# Patient Record
Sex: Female | Born: 1945
Health system: Southern US, Community
[De-identification: ages and names within clinical notes are randomized; demographics above are authoritative.]

## PROBLEM LIST (undated history)

## (undated) DIAGNOSIS — L859 Epidermal thickening, unspecified: Secondary | ICD-10-CM

## (undated) DIAGNOSIS — E785 Hyperlipidemia, unspecified: Secondary | ICD-10-CM

## (undated) DIAGNOSIS — N200 Calculus of kidney: Secondary | ICD-10-CM

## (undated) DIAGNOSIS — R51 Headache: Secondary | ICD-10-CM

## (undated) DIAGNOSIS — M1990 Unspecified osteoarthritis, unspecified site: Secondary | ICD-10-CM

## (undated) DIAGNOSIS — E663 Overweight: Secondary | ICD-10-CM

## (undated) DIAGNOSIS — R32 Unspecified urinary incontinence: Secondary | ICD-10-CM

## (undated) DIAGNOSIS — N951 Menopausal and female climacteric states: Secondary | ICD-10-CM

## (undated) DIAGNOSIS — R232 Flushing: Secondary | ICD-10-CM

## (undated) DIAGNOSIS — K219 Gastro-esophageal reflux disease without esophagitis: Secondary | ICD-10-CM

## (undated) DIAGNOSIS — G44229 Chronic tension-type headache, not intractable: Secondary | ICD-10-CM

## (undated) DIAGNOSIS — G709 Myoneural disorder, unspecified: Secondary | ICD-10-CM

## (undated) DIAGNOSIS — E079 Disorder of thyroid, unspecified: Secondary | ICD-10-CM

## (undated) HISTORY — DX: Overweight: E66.3

## (undated) HISTORY — DX: Unspecified urinary incontinence: R32

## (undated) HISTORY — DX: Myoneural disorder, unspecified: G70.9

## (undated) HISTORY — DX: Hyperlipidemia, unspecified: E78.5

## (undated) HISTORY — DX: Headache: R51

## (undated) HISTORY — PX: TUBAL LIGATION: SHX77

## (undated) HISTORY — DX: Flushing: R23.2

## (undated) HISTORY — PX: BREAST SURGERY: SHX581

## (undated) HISTORY — DX: Calculus of kidney: N20.0

## (undated) HISTORY — DX: Menopausal and female climacteric states: N95.1

## (undated) HISTORY — PX: APPENDECTOMY: SHX54

## (undated) HISTORY — PX: BREAST EXCISIONAL BIOPSY: SUR124

## (undated) HISTORY — PX: ABDOMINAL HYSTERECTOMY: SHX81

## (undated) HISTORY — DX: Epidermal thickening, unspecified: L85.9

## (undated) HISTORY — DX: Chronic tension-type headache, not intractable: G44.229

## (undated) HISTORY — DX: Gastro-esophageal reflux disease without esophagitis: K21.9

## (undated) HISTORY — DX: Unspecified osteoarthritis, unspecified site: M19.90

## (undated) HISTORY — PX: EYE SURGERY: SHX253

## (undated) HISTORY — DX: Disorder of thyroid, unspecified: E07.9

## (undated) HISTORY — PX: OTHER SURGICAL HISTORY: SHX169

---

## 1978-10-11 HISTORY — PX: OTHER SURGICAL HISTORY: SHX169

## 1993-10-11 HISTORY — PX: VESICOVAGINAL FISTULA CLOSURE W/ TAH: SUR271

## 1998-11-13 ENCOUNTER — Other Ambulatory Visit: Admission: RE | Admit: 1998-11-13 | Discharge: 1998-11-13 | Payer: Self-pay | Admitting: *Deleted

## 1999-12-10 ENCOUNTER — Encounter: Payer: Self-pay | Admitting: *Deleted

## 1999-12-10 ENCOUNTER — Encounter: Admission: RE | Admit: 1999-12-10 | Discharge: 1999-12-10 | Payer: Self-pay | Admitting: *Deleted

## 1999-12-23 ENCOUNTER — Other Ambulatory Visit: Admission: RE | Admit: 1999-12-23 | Discharge: 1999-12-23 | Payer: Self-pay | Admitting: *Deleted

## 2000-01-14 ENCOUNTER — Ambulatory Visit (HOSPITAL_COMMUNITY): Admission: RE | Admit: 2000-01-14 | Discharge: 2000-01-14 | Payer: Self-pay | Admitting: General Surgery

## 2000-12-21 ENCOUNTER — Other Ambulatory Visit: Admission: RE | Admit: 2000-12-21 | Discharge: 2000-12-21 | Payer: Self-pay | Admitting: *Deleted

## 2000-12-21 ENCOUNTER — Encounter: Payer: Self-pay | Admitting: General Surgery

## 2000-12-21 ENCOUNTER — Encounter: Admission: RE | Admit: 2000-12-21 | Discharge: 2000-12-21 | Payer: Self-pay | Admitting: General Surgery

## 2001-12-26 ENCOUNTER — Encounter: Payer: Self-pay | Admitting: General Surgery

## 2001-12-26 ENCOUNTER — Other Ambulatory Visit: Admission: RE | Admit: 2001-12-26 | Discharge: 2001-12-26 | Payer: Self-pay | Admitting: *Deleted

## 2001-12-26 ENCOUNTER — Encounter: Admission: RE | Admit: 2001-12-26 | Discharge: 2001-12-26 | Payer: Self-pay | Admitting: General Surgery

## 2002-01-12 ENCOUNTER — Ambulatory Visit (HOSPITAL_COMMUNITY): Admission: RE | Admit: 2002-01-12 | Discharge: 2002-01-12 | Payer: Self-pay | Admitting: General Surgery

## 2002-01-12 ENCOUNTER — Encounter: Payer: Self-pay | Admitting: General Surgery

## 2002-04-26 ENCOUNTER — Ambulatory Visit (HOSPITAL_COMMUNITY): Admission: RE | Admit: 2002-04-26 | Discharge: 2002-04-26 | Payer: Self-pay | Admitting: Gastroenterology

## 2002-07-27 ENCOUNTER — Ambulatory Visit (HOSPITAL_COMMUNITY): Admission: RE | Admit: 2002-07-27 | Discharge: 2002-07-27 | Payer: Self-pay | Admitting: Gastroenterology

## 2002-07-27 ENCOUNTER — Encounter (INDEPENDENT_AMBULATORY_CARE_PROVIDER_SITE_OTHER): Payer: Self-pay | Admitting: Specialist

## 2003-04-04 ENCOUNTER — Encounter: Payer: Self-pay | Admitting: *Deleted

## 2003-04-04 ENCOUNTER — Encounter: Admission: RE | Admit: 2003-04-04 | Discharge: 2003-04-04 | Payer: Self-pay | Admitting: *Deleted

## 2003-04-29 ENCOUNTER — Other Ambulatory Visit: Admission: RE | Admit: 2003-04-29 | Discharge: 2003-04-29 | Payer: Self-pay | Admitting: *Deleted

## 2003-10-12 HISTORY — PX: SPINE SURGERY: SHX786

## 2003-11-15 ENCOUNTER — Encounter: Admission: RE | Admit: 2003-11-15 | Discharge: 2003-11-15 | Payer: Self-pay | Admitting: Oncology

## 2004-05-06 ENCOUNTER — Encounter: Admission: RE | Admit: 2004-05-06 | Discharge: 2004-05-06 | Payer: Self-pay | Admitting: General Surgery

## 2004-06-08 ENCOUNTER — Other Ambulatory Visit: Admission: RE | Admit: 2004-06-08 | Discharge: 2004-06-08 | Payer: Self-pay | Admitting: *Deleted

## 2004-07-28 ENCOUNTER — Encounter: Admission: RE | Admit: 2004-07-28 | Discharge: 2004-07-28 | Payer: Self-pay | Admitting: Family Medicine

## 2004-08-25 ENCOUNTER — Ambulatory Visit (HOSPITAL_COMMUNITY): Admission: RE | Admit: 2004-08-25 | Discharge: 2004-08-26 | Payer: Self-pay | Admitting: Neurosurgery

## 2004-11-04 ENCOUNTER — Ambulatory Visit: Payer: Self-pay | Admitting: Oncology

## 2005-05-07 ENCOUNTER — Encounter: Admission: RE | Admit: 2005-05-07 | Discharge: 2005-05-07 | Payer: Self-pay | Admitting: Oncology

## 2005-06-16 ENCOUNTER — Ambulatory Visit: Payer: Self-pay | Admitting: Oncology

## 2005-07-06 ENCOUNTER — Other Ambulatory Visit: Admission: RE | Admit: 2005-07-06 | Discharge: 2005-07-06 | Payer: Self-pay | Admitting: *Deleted

## 2005-08-02 ENCOUNTER — Encounter: Admission: RE | Admit: 2005-08-02 | Discharge: 2005-08-02 | Payer: Self-pay | Admitting: Oncology

## 2005-08-27 ENCOUNTER — Ambulatory Visit: Payer: Self-pay | Admitting: Oncology

## 2005-09-15 ENCOUNTER — Encounter: Admission: RE | Admit: 2005-09-15 | Discharge: 2005-09-15 | Payer: Self-pay | Admitting: Oncology

## 2005-12-20 ENCOUNTER — Ambulatory Visit: Payer: Self-pay | Admitting: Oncology

## 2006-03-14 ENCOUNTER — Encounter: Admission: RE | Admit: 2006-03-14 | Discharge: 2006-03-14 | Payer: Self-pay | Admitting: Family Medicine

## 2006-05-12 ENCOUNTER — Encounter: Admission: RE | Admit: 2006-05-12 | Discharge: 2006-07-28 | Payer: Self-pay | Admitting: Family Medicine

## 2006-07-08 ENCOUNTER — Ambulatory Visit: Payer: Self-pay | Admitting: Oncology

## 2006-08-10 ENCOUNTER — Encounter: Admission: RE | Admit: 2006-08-10 | Discharge: 2006-09-09 | Payer: Self-pay | Admitting: Specialist

## 2006-09-10 ENCOUNTER — Encounter: Admission: RE | Admit: 2006-09-10 | Discharge: 2006-09-30 | Payer: Self-pay | Admitting: Specialist

## 2006-09-28 ENCOUNTER — Other Ambulatory Visit: Admission: RE | Admit: 2006-09-28 | Discharge: 2006-09-28 | Payer: Self-pay | Admitting: *Deleted

## 2006-09-30 ENCOUNTER — Encounter: Admission: RE | Admit: 2006-09-30 | Discharge: 2006-09-30 | Payer: Self-pay | Admitting: Oncology

## 2007-07-24 ENCOUNTER — Ambulatory Visit: Payer: Self-pay | Admitting: Oncology

## 2007-07-25 LAB — MORPHOLOGY

## 2007-07-25 LAB — CBC WITH DIFFERENTIAL/PLATELET
Basophils Absolute: 0 10*3/uL (ref 0.0–0.1)
Eosinophils Absolute: 0.1 10*3/uL (ref 0.0–0.5)
HGB: 13.3 g/dL (ref 11.6–15.9)
MCV: 88.4 fL (ref 81.0–101.0)
MONO%: 5.7 % (ref 0.0–13.0)
NEUT#: 4 10*3/uL (ref 1.5–6.5)
RDW: 13.3 % (ref 11.3–14.5)

## 2007-07-25 LAB — FERRITIN: Ferritin: 84 ng/mL (ref 10–291)

## 2007-07-25 LAB — COMPREHENSIVE METABOLIC PANEL
ALT: 19 U/L (ref 0–35)
AST: 18 U/L (ref 0–37)
Alkaline Phosphatase: 75 U/L (ref 39–117)
Sodium: 140 mEq/L (ref 135–145)
Total Bilirubin: 0.9 mg/dL (ref 0.3–1.2)
Total Protein: 7.3 g/dL (ref 6.0–8.3)

## 2007-10-02 ENCOUNTER — Other Ambulatory Visit: Admission: RE | Admit: 2007-10-02 | Discharge: 2007-10-02 | Payer: Self-pay | Admitting: *Deleted

## 2007-10-09 ENCOUNTER — Encounter: Admission: RE | Admit: 2007-10-09 | Discharge: 2007-10-09 | Payer: Self-pay | Admitting: Oncology

## 2008-01-26 ENCOUNTER — Ambulatory Visit: Payer: Self-pay | Admitting: Oncology

## 2008-01-30 LAB — CBC WITH DIFFERENTIAL/PLATELET
Basophils Absolute: 0 10*3/uL (ref 0.0–0.1)
Eosinophils Absolute: 0.1 10*3/uL (ref 0.0–0.5)
HCT: 36.8 % (ref 34.8–46.6)
HGB: 12.8 g/dL (ref 11.6–15.9)
LYMPH%: 49 % — ABNORMAL HIGH (ref 14.0–48.0)
MCV: 87.8 fL (ref 81.0–101.0)
MONO%: 6.2 % (ref 0.0–13.0)
NEUT#: 2.4 10*3/uL (ref 1.5–6.5)
NEUT%: 42.4 % (ref 39.6–76.8)
Platelets: 182 10*3/uL (ref 145–400)

## 2008-01-30 LAB — COMPREHENSIVE METABOLIC PANEL
Albumin: 4.5 g/dL (ref 3.5–5.2)
Alkaline Phosphatase: 62 U/L (ref 39–117)
BUN: 13 mg/dL (ref 6–23)
Creatinine, Ser: 0.7 mg/dL (ref 0.40–1.20)
Glucose, Bld: 121 mg/dL — ABNORMAL HIGH (ref 70–99)
Potassium: 4.1 mEq/L (ref 3.5–5.3)
Total Bilirubin: 0.5 mg/dL (ref 0.3–1.2)

## 2008-05-15 ENCOUNTER — Encounter: Admission: RE | Admit: 2008-05-15 | Discharge: 2008-05-15 | Payer: Self-pay | Admitting: Family Medicine

## 2008-09-18 ENCOUNTER — Encounter: Admission: RE | Admit: 2008-09-18 | Discharge: 2008-09-18 | Payer: Self-pay | Admitting: Family Medicine

## 2008-10-02 ENCOUNTER — Other Ambulatory Visit: Admission: RE | Admit: 2008-10-02 | Discharge: 2008-10-02 | Payer: Self-pay | Admitting: Gynecology

## 2008-10-09 ENCOUNTER — Encounter: Admission: RE | Admit: 2008-10-09 | Discharge: 2008-10-09 | Payer: Self-pay | Admitting: Oncology

## 2008-10-14 ENCOUNTER — Encounter: Admission: RE | Admit: 2008-10-14 | Discharge: 2008-10-14 | Payer: Self-pay | Admitting: Oncology

## 2008-10-14 ENCOUNTER — Ambulatory Visit: Payer: Self-pay | Admitting: Oncology

## 2008-10-15 ENCOUNTER — Encounter: Admission: RE | Admit: 2008-10-15 | Discharge: 2008-10-15 | Payer: Self-pay | Admitting: Oncology

## 2008-10-16 LAB — CBC WITH DIFFERENTIAL/PLATELET
Basophils Absolute: 0 10*3/uL (ref 0.0–0.1)
EOS%: 0.8 % (ref 0.0–7.0)
Eosinophils Absolute: 0.1 10*3/uL (ref 0.0–0.5)
HGB: 13.4 g/dL (ref 11.6–15.9)
LYMPH%: 35.7 % (ref 14.0–48.0)
MCH: 30.1 pg (ref 26.0–34.0)
MCV: 89.4 fL (ref 81.0–101.0)
MONO%: 4.8 % (ref 0.0–13.0)
NEUT#: 4.9 10*3/uL (ref 1.5–6.5)
Platelets: 198 10*3/uL (ref 145–400)
RBC: 4.45 10*6/uL (ref 3.70–5.32)

## 2008-10-16 LAB — COMPREHENSIVE METABOLIC PANEL
AST: 33 U/L (ref 0–37)
Alkaline Phosphatase: 73 U/L (ref 39–117)
BUN: 11 mg/dL (ref 6–23)
Glucose, Bld: 117 mg/dL — ABNORMAL HIGH (ref 70–99)
Total Bilirubin: 0.8 mg/dL (ref 0.3–1.2)

## 2008-11-21 ENCOUNTER — Encounter: Admission: RE | Admit: 2008-11-21 | Discharge: 2008-11-21 | Payer: Self-pay | Admitting: Oncology

## 2009-01-03 ENCOUNTER — Ambulatory Visit: Payer: Self-pay | Admitting: Genetic Counselor

## 2009-02-18 ENCOUNTER — Encounter: Admission: RE | Admit: 2009-02-18 | Discharge: 2009-02-18 | Payer: Self-pay | Admitting: Neurosurgery

## 2009-04-18 ENCOUNTER — Encounter: Admission: RE | Admit: 2009-04-18 | Discharge: 2009-04-18 | Payer: Self-pay | Admitting: Family Medicine

## 2009-07-21 ENCOUNTER — Ambulatory Visit: Payer: Self-pay | Admitting: Oncology

## 2009-07-23 LAB — CBC WITH DIFFERENTIAL/PLATELET
BASO%: 0.4 % (ref 0.0–2.0)
EOS%: 1.5 % (ref 0.0–7.0)
Eosinophils Absolute: 0.1 10*3/uL (ref 0.0–0.5)
LYMPH%: 47.8 % (ref 14.0–49.7)
MCH: 30 pg (ref 25.1–34.0)
MCHC: 34.5 g/dL (ref 31.5–36.0)
MCV: 87 fL (ref 79.5–101.0)
MONO%: 4.7 % (ref 0.0–14.0)
Platelets: 170 10*3/uL (ref 145–400)
RBC: 4.55 10*6/uL (ref 3.70–5.45)
RDW: 13.6 % (ref 11.2–14.5)

## 2009-07-23 LAB — COMPREHENSIVE METABOLIC PANEL
AST: 47 U/L — ABNORMAL HIGH (ref 0–37)
Alkaline Phosphatase: 70 U/L (ref 39–117)
Glucose, Bld: 130 mg/dL — ABNORMAL HIGH (ref 70–99)
Potassium: 4.6 mEq/L (ref 3.5–5.3)
Sodium: 140 mEq/L (ref 135–145)
Total Bilirubin: 0.6 mg/dL (ref 0.3–1.2)
Total Protein: 7.7 g/dL (ref 6.0–8.3)

## 2009-10-14 ENCOUNTER — Encounter: Admission: RE | Admit: 2009-10-14 | Discharge: 2009-10-14 | Payer: Self-pay | Admitting: Oncology

## 2009-10-29 ENCOUNTER — Encounter: Admission: RE | Admit: 2009-10-29 | Discharge: 2009-10-29 | Payer: Self-pay | Admitting: Oncology

## 2009-11-06 DIAGNOSIS — Z947 Corneal transplant status: Secondary | ICD-10-CM | POA: Insufficient documentation

## 2010-07-20 ENCOUNTER — Ambulatory Visit: Payer: Self-pay | Admitting: Oncology

## 2010-07-22 LAB — CBC WITH DIFFERENTIAL/PLATELET
BASO%: 0.3 % (ref 0.0–2.0)
Basophils Absolute: 0 10*3/uL (ref 0.0–0.1)
EOS%: 6.4 % (ref 0.0–7.0)
Eosinophils Absolute: 0.4 10*3/uL (ref 0.0–0.5)
HCT: 38.3 % (ref 34.8–46.6)
HGB: 13.2 g/dL (ref 11.6–15.9)
LYMPH%: 41.4 % (ref 14.0–49.7)
MCH: 30.5 pg (ref 25.1–34.0)
MCHC: 34.6 g/dL (ref 31.5–36.0)
MCV: 88.2 fL (ref 79.5–101.0)
MONO#: 0.4 10*3/uL (ref 0.1–0.9)
MONO%: 5.4 % (ref 0.0–14.0)
NEUT#: 3.1 10*3/uL (ref 1.5–6.5)
NEUT%: 46.5 % (ref 38.4–76.8)
Platelets: 168 10*3/uL (ref 145–400)
RBC: 4.34 10*6/uL (ref 3.70–5.45)
RDW: 14 % (ref 11.2–14.5)
WBC: 6.7 10*3/uL (ref 3.9–10.3)
lymph#: 2.8 10*3/uL (ref 0.9–3.3)

## 2010-07-22 LAB — COMPREHENSIVE METABOLIC PANEL
ALT: 26 U/L (ref 0–35)
AST: 25 U/L (ref 0–37)
Albumin: 4.5 g/dL (ref 3.5–5.2)
Alkaline Phosphatase: 83 U/L (ref 39–117)
BUN: 15 mg/dL (ref 6–23)
CO2: 23 mEq/L (ref 19–32)
Calcium: 10 mg/dL (ref 8.4–10.5)
Chloride: 106 mEq/L (ref 96–112)
Creatinine, Ser: 0.76 mg/dL (ref 0.40–1.20)
Glucose, Bld: 113 mg/dL — ABNORMAL HIGH (ref 70–99)
Potassium: 4.4 mEq/L (ref 3.5–5.3)
Sodium: 141 mEq/L (ref 135–145)
Total Bilirubin: 0.7 mg/dL (ref 0.3–1.2)
Total Protein: 7.7 g/dL (ref 6.0–8.3)

## 2010-10-19 ENCOUNTER — Encounter
Admission: RE | Admit: 2010-10-19 | Discharge: 2010-10-19 | Payer: Self-pay | Source: Home / Self Care | Attending: Oncology | Admitting: Oncology

## 2010-10-31 ENCOUNTER — Encounter: Payer: Self-pay | Admitting: Internal Medicine

## 2011-02-17 ENCOUNTER — Other Ambulatory Visit: Payer: Self-pay | Admitting: Family Medicine

## 2011-02-17 DIAGNOSIS — E041 Nontoxic single thyroid nodule: Secondary | ICD-10-CM

## 2011-02-18 ENCOUNTER — Other Ambulatory Visit: Payer: Self-pay

## 2011-02-26 NOTE — Op Note (Signed)
NAMEMarland Kitchen  ALLIZON, WOZNICK NO.:  [id number]   MEDICAL RECORD NO.:  [id number]          PATIENT TYPE:  OIB   LOCATION:  2899                         FACILITY:  [facility]   PHYSICIAN:  Danae Orleans. Venetia Maxon, M.D.  DATE OF BIRTH:  [birth year]   DATE OF PROCEDURE:  08/25/2004  DATE OF DISCHARGE:                                 OPERATIVE REPORT   PREOPERATIVE DIAGNOSIS:  Left L4-L5 foraminal stenosis with L5  radiculopathy, spondylosis, degenerative disc disease, and  spondylolisthesis.   POSTOPERATIVE DIAGNOSIS:  Left L4-L5 foraminal stenosis with L5  radiculopathy, spondylosis, degenerative disc disease, and  spondylolisthesis.   PROCEDURE:  Left L4-L5 foraminotomy.   SURGEON:  Danae Orleans. Venetia Maxon, M.D.   ASSISTANT:  Clydene Fake, M.D.   ANESTHESIA:  General endotracheal anesthesia.   ESTIMATED BLOOD LOSS:  Minimal.   COMPLICATIONS:  None.   DISPOSITION:  Recovery room.   INDICATIONS FOR PROCEDURE:  Conleigh Heinlein is a 65 year old woman with a left  L5 radiculopathy with lateral recess stenosis of L4-L5 on the left with  minimally mobile spondylolisthesis L4 and L5 and degenerative disc disease.  She has an L5 radiculopathy.  She had injections without long term relief of  her pain and it was elected to go ahead with foraminotomy to decompress the  L5 nerve root.   PROCEDURE:  Ms. Bartolotta was brought to the operating room.  Following  satisfactory and uncomplicated induction of general endotracheal anesthesia  and placement of intravenous lines, the patient was placed in a prone  position on the operating table.  She was placed on the Wilson frame.  Her  low back was then prepped and draped in the usual sterile fashion.  The area  of planned incision was infiltrated with 0.25% Marcaine and 0.5% lidocaine  with 1:200,000 epinephrine.  An incision was made overlying the L4-L5  interspace and carried through adipost tissue to the lumbodorsal fascia  which was incised to  the left side of midline.  Subperiosteal dissection was  performed exposing what was felt to be the L4-L5 interspace.  An  interoperative x-ray was obtained which confirmed correct orientation at the  L4-L5 level.  Using a high speed drill, hemilaminectomy of L4 was performed  and this was completed using Kerrison rongeurs.  The ligamentum flavum was  detached from the superior aspect of the L5 lamina and this was removed with  Kerrison rongeurs.  The ligamentum flavum was then detached and removed in a  piecemeal fashion.  The lateral recess was decompressed and the L5 nerve  root was decompressed.  There appeared to be no residual lateral recess  stenosis after this area was carefully decompressed using a variety of  Kerrison rongeurs under loupe magnification.  Hemostasis was assured.  The  wound was copiously irrigated with Bacitracin saline.  The operative site  was bathed in 2 mL of Fentanyl and 80 mg Depo-Medrol.  The self-retaining  retractor was removed.  The lumbodorsal fascia was closed with 0 Vicryl  sutures, the subcutaneous tissues were reapproximated with 2-0 Vicryl  interrupted inverted sutures, and the skin edges  were reapproximated with  interrupted 3-0 Vicryl subcuticular tissue.  The wound was dressed with  Dermabond.  The patient was extubated in the operating room and taken to the  recovery room in stable condition having tolerated the operation well.  Counts were correct at the end of the case.      Jose   JDS/MEDQ  D:  08/25/2004  T:  08/25/2004  Job:  [id number]

## 2011-02-26 NOTE — Op Note (Signed)
NAME:  Laura Farrell, Laura Farrell                         ACCOUNT NO.:  [id number]   MEDICAL RECORD NO.:  [id number]                   PATIENT TYPE:  AMB   LOCATION:  ENDO                                 FACILITY:  [facility]   PHYSICIAN:  Bernette Redbird, MD                  DATE OF BIRTH:  [birth year]   DATE OF PROCEDURE:  07/27/2002  DATE OF DISCHARGE:                                 OPERATIVE REPORT   PROCEDURE:  Upper endoscopy with biopsies.   INDICATIONS:  Follow-up of erosive esophagitis noted endoscopically about  four months ago.  The patient has been switched from Prevacid to Protonix,  which I encouraged her to use on a more regular basis.   FINDINGS:  Short-segment erythema in distal esophagus, questionable  Barrett's esophagus versus residual inflammatory changes.   DESCRIPTION OF PROCEDURE:  The nature, purpose, and risks of the procedure  were familiar to the patient from prior examination, and she provided  written consent.  Sedation was fentanyl 40 mcg and Versed 4 mg IV, without  arrhythmias or desaturation.  There were subtle inflammatory changes in the  laryngeal region characterized by some slight erythema toward the posterior  end of the vocal cords on the arytenoid cartilages.  The Olympus small-  caliber video endoscope was passed under direct vision into the esophagus.  The distal esophagus had some tongues of erythematous mucosa with perhaps  some minimal erosive changes.  It is unclear whether this represents  Barrett's esophagus or some mild residual esophagitis, for which several  biopsies were obtained at the conclusion of the procedure.  A transiently-  observed esophageal ring was present, but there was certainly no evidence of  distal stricturing or any tight ring.  I saw no evidence of infection,  neoplasia, or varices.  There was a roughly 2 cm hiatal hernia, and the  diaphragmatic hiatus was slightly patulous.   The stomach contained no significant residual,  had some faint antral  erythema, but otherwise the mucosa looked normal, and no erosions, ulcers,  polyps, or masses were observed, including a retroflexed view of the  proximal stomach.  The pylorus, duodenal bulb, and second duodenum were  unremarkable.   The patient tolerated this procedure well, and there were no apparent  complications.   IMPRESSION:  1. Possible changes in the distal esophagus secondary to reflux, pathology     pending.  2. Small to medium-sized hiatal hernia.   PLAN:  Await pathology on today's biopsies.  Continue PPI therapy.                                               Bernette Redbird, MD    RB/MEDQ  D:  07/27/2002  T:  07/28/2002  Job:  [id number]   cc:  Chales Salmon. Abigail Miyamoto, M.D.

## 2011-03-01 ENCOUNTER — Other Ambulatory Visit: Payer: Self-pay

## 2011-07-21 ENCOUNTER — Other Ambulatory Visit: Payer: Self-pay | Admitting: Oncology

## 2011-07-21 ENCOUNTER — Encounter (HOSPITAL_BASED_OUTPATIENT_CLINIC_OR_DEPARTMENT_OTHER): Payer: Self-pay | Admitting: Oncology

## 2011-07-21 DIAGNOSIS — Z803 Family history of malignant neoplasm of breast: Secondary | ICD-10-CM

## 2011-07-21 DIAGNOSIS — Z1231 Encounter for screening mammogram for malignant neoplasm of breast: Secondary | ICD-10-CM

## 2011-08-02 DIAGNOSIS — T85398A Other mechanical complication of other ocular prosthetic devices, implants and grafts, initial encounter: Secondary | ICD-10-CM | POA: Insufficient documentation

## 2011-10-09 ENCOUNTER — Telehealth: Payer: Self-pay | Admitting: Oncology

## 2011-10-09 NOTE — Telephone Encounter (Signed)
S/w the pt and she is aware of her lab appt in feb and the md appt in oct.

## 2011-10-21 ENCOUNTER — Ambulatory Visit: Payer: Self-pay

## 2011-10-22 ENCOUNTER — Ambulatory Visit
Admission: RE | Admit: 2011-10-22 | Discharge: 2011-10-22 | Disposition: A | Discharge status: Home / Self Care | Payer: Medicare Other | Source: Ambulatory Visit | Attending: Oncology | Admitting: Oncology

## 2011-10-22 DIAGNOSIS — Z1231 Encounter for screening mammogram for malignant neoplasm of breast: Secondary | ICD-10-CM | POA: Diagnosis not present

## 2011-10-27 DIAGNOSIS — G250 Essential tremor: Secondary | ICD-10-CM | POA: Diagnosis not present

## 2011-10-27 DIAGNOSIS — G252 Other specified forms of tremor: Secondary | ICD-10-CM | POA: Diagnosis not present

## 2011-11-04 DIAGNOSIS — R7301 Impaired fasting glucose: Secondary | ICD-10-CM | POA: Diagnosis not present

## 2011-11-04 DIAGNOSIS — E039 Hypothyroidism, unspecified: Secondary | ICD-10-CM | POA: Diagnosis not present

## 2011-11-04 DIAGNOSIS — K219 Gastro-esophageal reflux disease without esophagitis: Secondary | ICD-10-CM | POA: Diagnosis not present

## 2011-11-04 DIAGNOSIS — E7800 Pure hypercholesterolemia, unspecified: Secondary | ICD-10-CM | POA: Diagnosis not present

## 2011-11-04 DIAGNOSIS — Z79899 Other long term (current) drug therapy: Secondary | ICD-10-CM | POA: Diagnosis not present

## 2011-11-15 ENCOUNTER — Other Ambulatory Visit (HOSPITAL_BASED_OUTPATIENT_CLINIC_OR_DEPARTMENT_OTHER): Payer: Medicare Other | Admitting: Lab

## 2011-11-15 DIAGNOSIS — Z803 Family history of malignant neoplasm of breast: Secondary | ICD-10-CM | POA: Diagnosis not present

## 2011-11-15 LAB — BASIC METABOLIC PANEL
CO2: 23 mEq/L (ref 19–32)
Chloride: 103 mEq/L (ref 96–112)
Sodium: 140 mEq/L (ref 135–145)

## 2011-11-16 ENCOUNTER — Other Ambulatory Visit: Payer: Self-pay | Admitting: Oncology

## 2011-11-16 ENCOUNTER — Telehealth: Payer: Self-pay | Admitting: Oncology

## 2011-11-16 DIAGNOSIS — C50919 Malignant neoplasm of unspecified site of unspecified female breast: Secondary | ICD-10-CM

## 2011-11-16 NOTE — Telephone Encounter (Signed)
Called Cathy [facility], left message regarding MRI of Breast within 3 months of mammogram, which was done 10/25/11. Waiting for an appt date.

## 2011-11-17 ENCOUNTER — Other Ambulatory Visit: Payer: Self-pay | Admitting: Oncology

## 2011-11-17 DIAGNOSIS — Z853 Personal history of malignant neoplasm of breast: Secondary | ICD-10-CM

## 2011-11-22 ENCOUNTER — Telehealth: Payer: Self-pay

## 2011-11-22 NOTE — Telephone Encounter (Signed)
LM FOR KATHY TO CALL TOMORROW TO VERIFY THAT DR. Darrold Span REENTERED THE MRI ORDER INCORRECTLY AGAIN ON 11-17-11.  ORDER SHOULD READ FAMILY HX OF BREAST CANCER NOT PERSONAL HISTORY. ORDER WILL NEED REENTERING IF INCORRECT.

## 2011-11-23 ENCOUNTER — Telehealth: Payer: Self-pay | Admitting: Oncology

## 2011-11-23 ENCOUNTER — Telehealth: Payer: Self-pay

## 2011-11-23 ENCOUNTER — Other Ambulatory Visit: Payer: Self-pay | Admitting: Oncology

## 2011-11-23 DIAGNOSIS — Z803 Family history of malignant neoplasm of breast: Secondary | ICD-10-CM

## 2011-11-23 NOTE — Telephone Encounter (Signed)
Received call from [facility] at the [facility] [place] stating that when she actually spoke with pt yesterday and reviewed records, pt stated that she does not have a personal history of breast cancer, but that she has a family history of breast cancer (sister and cousins).  She requests that Dr. Darrold Span cancel 2 previous MRI orders, and enter a new one with dx code V16.3, family history of breast malignancy.  She apologizes for the mix-up.  This information relayed to MD.

## 2011-11-23 NOTE — Telephone Encounter (Signed)
Called Lynden Ang again at the [facility] , left another message regarding MRI of Breast, still waiting for pt to be schedule.

## 2011-11-24 ENCOUNTER — Other Ambulatory Visit: Payer: Self-pay | Admitting: Physician Assistant

## 2011-11-24 DIAGNOSIS — L538 Other specified erythematous conditions: Secondary | ICD-10-CM | POA: Diagnosis not present

## 2011-11-24 DIAGNOSIS — L259 Unspecified contact dermatitis, unspecified cause: Secondary | ICD-10-CM | POA: Diagnosis not present

## 2011-11-24 DIAGNOSIS — D485 Neoplasm of uncertain behavior of skin: Secondary | ICD-10-CM | POA: Diagnosis not present

## 2011-11-24 DIAGNOSIS — D239 Other benign neoplasm of skin, unspecified: Secondary | ICD-10-CM | POA: Diagnosis not present

## 2011-11-24 DIAGNOSIS — T85398A Other mechanical complication of other ocular prosthetic devices, implants and grafts, initial encounter: Secondary | ICD-10-CM | POA: Diagnosis not present

## 2011-11-26 ENCOUNTER — Telehealth: Payer: Self-pay

## 2011-11-26 DIAGNOSIS — Z803 Family history of malignant neoplasm of breast: Secondary | ICD-10-CM

## 2011-11-26 NOTE — Telephone Encounter (Signed)
ORDER FOR BREAST MRI CORRECTLY ORDERED TODAY EXCEPT NO E SIGNITURE.  KATHY WILL PRINT ORDER AND FAX TO DR. Darrold Span TO SIGN.

## 2011-12-02 NOTE — Progress Notes (Signed)
Order for MRI of breast, bilateral, signed by MD as requested by Osborne Oman with the [facility] and faxed back to [phone].

## 2011-12-08 ENCOUNTER — Ambulatory Visit
Admission: RE | Admit: 2011-12-08 | Discharge: 2011-12-08 | Disposition: A | Discharge status: Home / Self Care | Payer: Medicare Other | Source: Ambulatory Visit | Attending: Oncology | Admitting: Oncology

## 2011-12-08 DIAGNOSIS — N6489 Other specified disorders of breast: Secondary | ICD-10-CM | POA: Diagnosis not present

## 2011-12-08 DIAGNOSIS — Z803 Family history of malignant neoplasm of breast: Secondary | ICD-10-CM

## 2011-12-08 MED ORDER — GADOBENATE DIMEGLUMINE 529 MG/ML IV SOLN
17.0000 mL | Freq: Once | INTRAVENOUS | Status: AC | PRN
  Administered 2011-12-08: 17 mL via INTRAVENOUS

## 2011-12-09 ENCOUNTER — Other Ambulatory Visit: Payer: Medicare Other

## 2011-12-14 ENCOUNTER — Telehealth: Payer: Self-pay

## 2011-12-14 NOTE — Telephone Encounter (Signed)
SPOKE WITH [name] AND TOLD HER THAT HER BREAST MRI DONE ON 12-08-11 WAS FINE. [name] STATED THAT THE CAD READING PORTION OF THE MRI MAY NOT BE COVERED BY HER INSURANCE.  TOLD HER THAT SHE CAN BRING IN AN APPEAL TO DR. Precious Reel NURSE IF PAYMENT DENIED AND OUR MANAGED CARE TEAM CAN LOOK IN FILING APPEAL.

## 2011-12-22 DIAGNOSIS — G252 Other specified forms of tremor: Secondary | ICD-10-CM | POA: Diagnosis not present

## 2011-12-22 DIAGNOSIS — G250 Essential tremor: Secondary | ICD-10-CM | POA: Diagnosis not present

## 2011-12-23 DIAGNOSIS — R198 Other specified symptoms and signs involving the digestive system and abdomen: Secondary | ICD-10-CM | POA: Diagnosis not present

## 2011-12-23 DIAGNOSIS — K591 Functional diarrhea: Secondary | ICD-10-CM | POA: Diagnosis not present

## 2011-12-23 DIAGNOSIS — L538 Other specified erythematous conditions: Secondary | ICD-10-CM | POA: Diagnosis not present

## 2012-01-05 ENCOUNTER — Encounter: Payer: Self-pay | Admitting: Neurology

## 2012-01-20 DIAGNOSIS — L820 Inflamed seborrheic keratosis: Secondary | ICD-10-CM | POA: Diagnosis not present

## 2012-01-20 DIAGNOSIS — L909 Atrophic disorder of skin, unspecified: Secondary | ICD-10-CM | POA: Diagnosis not present

## 2012-02-04 DIAGNOSIS — E785 Hyperlipidemia, unspecified: Secondary | ICD-10-CM | POA: Diagnosis not present

## 2012-02-07 DIAGNOSIS — E042 Nontoxic multinodular goiter: Secondary | ICD-10-CM | POA: Diagnosis not present

## 2012-02-07 DIAGNOSIS — E039 Hypothyroidism, unspecified: Secondary | ICD-10-CM | POA: Diagnosis not present

## 2012-02-15 DIAGNOSIS — H579 Unspecified disorder of eye and adnexa: Secondary | ICD-10-CM | POA: Diagnosis not present

## 2012-02-28 ENCOUNTER — Ambulatory Visit: Payer: Medicare Other | Admitting: Neurology

## 2012-03-20 ENCOUNTER — Encounter: Payer: Self-pay | Admitting: Neurology

## 2012-03-20 ENCOUNTER — Ambulatory Visit (INDEPENDENT_AMBULATORY_CARE_PROVIDER_SITE_OTHER): Payer: Medicare Other | Admitting: Neurology

## 2012-03-20 VITALS — BP 120/72 | HR 104 | Wt 183.0 lb

## 2012-03-20 DIAGNOSIS — G252 Other specified forms of tremor: Secondary | ICD-10-CM | POA: Diagnosis not present

## 2012-03-20 DIAGNOSIS — G250 Essential tremor: Secondary | ICD-10-CM | POA: Insufficient documentation

## 2012-03-20 MED ORDER — PROPRANOLOL HCL 20 MG PO TABS: 60.0000 mg | ORAL_TABLET | Freq: Two times a day (BID) | ORAL | Status: DC

## 2012-03-20 NOTE — Progress Notes (Signed)
- dr. Ezzard Standing - [employer] [facility]lder     - complains of tremor for last 20 years - eating problems with salads - hand writing - dooesn't know if it effects head - alcohol doesn tknow - 30 years of tremor -   FamHx: -   unreactive left pupil   Dear Dr. Venetia Maxon,  Thank you for having me see Laura Farrell in consultation today at [facility] for her problem with tremor.  As you may recall, she is a 66 y.o. year old female with a history of hypothyroidism and a long history of tremor affecting both hands.  It is getting in the way of her eating soups and salads, her handwriting is worse, it is difficult to read a paper at times and she has difficulty putting in her contacts.  She doesn't drink alcohol to determine if it gets better with use.  She has been on propranolol for over 20 years - with no higher doses being used.  She was placed on primidone about 1 year ago.  She says the propranolol helps but the primidone does not.  She also complains of daytime fatigue but can't put a time frame to it.  She has not had changes in dexterity or falls.  Her thyroid was checked recently, and was normal.  She also drinks limited caffeine.  Past Medical History  Diagnosis Date  . Hot flashes   . Over weight   - hypothyroidism  Past Surgical History  Procedure Date  . Abdominal hysterectomy   . Left shoulder   . Lower back surgery   . Cornea implant     History   Social History  . Marital Status: Married    Spouse Name: N/A    Number of Children: N/A  . Years of Education: N/A   Social History Main Topics  . Smoking status: Never Smoker   . Smokeless tobacco: Never Used  . Alcohol Use: No  . Drug Use: No  . Sexually Active: None   Other Topics Concern  . None   Social History Narrative  . None  - limited caffeine use.  Family History  Problem Relation Age of Onset  . Tremor Mother   . Tremor Father   . Tremor Sister   . Tremor Brother       Current  Outpatient Prescriptions on File Prior to Visit  Medication Sig Dispense Refill  . gabapentin (NEURONTIN) 600 MG tablet Take 600 mg by mouth. One twice a day for hot flashes.      . levothyroxine (SYNTHROID, LEVOTHROID) 88 MCG tablet Take 88 mcg by mouth daily.      Marland Kitchen omeprazole (PRILOSEC) 40 MG capsule Take 40 mg by mouth daily.      . primidone (MYSOLINE) 50 MG tablet Take 50 mg by mouth. 2 qhs      . propranolol (INDERAL) 20 MG tablet Take 3 tablets (60 mg total) by mouth 2 (two) times daily. Two tab twice a day  180 tablet  3  . simvastatin (ZOCOR) 40 MG tablet Take 40 mg by mouth every evening.      . venlafaxine (EFFEXOR) 25 MG tablet Take 25 mg by mouth 2 (two) times daily.        No Known Allergies    ROS:  13 systems were reviewed and are notable for day time sleepiness.  All other review of systems are unremarkable.   Examination:  Filed Vitals:   03/20/12 0919  BP:  120/72  Pulse: 104  Weight: 183 lb (83.008 kg)     In general, well appearing women.  Cardiovascular: The patient has a regular rate and rhythm.  Fundoscopy:  Disks are flat. Vessel caliber within normal limits.  Mental status:   The patient is oriented to person, place and time. Recent and remote memory are intact. Attention span and concentration are normal. Language including repetition, naming, following commands are intact. Fund of knowledge of current and historical events, as well as vocabulary are normal.  Cranial Nerves: Pupils are equally round and reactive to light. Visual fields full to confrontation. Extraocular movements are intact without nystagmus there is saccadic intrusion.  Facial sensation and muscles of mastication are intact. Muscles of facial expression are symmetric. Hearing intact to bilateral finger rub. Tongue protrusion, uvula, palate midline.  Shoulder shrug intact  Motor:  The patient has normal bulk and tone, no pronator drift.  Fine mainly postural tremor, worse on the  left.  5/5 muscle strength bilaterally.  Reflexes:   Biceps  Triceps Brachioradialis Knee Ankle  Right 2+  2+  2+   2+ 2+  Left  2+  2+  2+   2+ 2+  Toes down  Coordination:  Finger to nose mildly impaired on left, likely terminal tremor.  No dysdiadokinesia.  Sensation is intact to vibration.  Gait and Station are normal.  Tandem gait is intact.  Romberg is negative.  No resting tremor induced by walking.   Impression/Recs: 1.  Essential tremor - I am pretty certain she has an essential tremor, given the strong family history and characteristics.  I would like to maximize her propranolol first.  She has a HR > 100 so we are in little danger of dropping her HR to low.  I am going to increase it to 60 bid(she takes it upon rising and at dinner).  In addition, she would like to try to come off the primidone.  I think it is reasonable as this could be causing her fatigue.  We will decreased it by 50mg  every month.   We will see the patient back in 2 months.  Thank you for having [place] see Laura Farrell in consultation.  Feel free to contact me with any questions.  Lupita Raider Modesto Charon, MD [facility], [place] [address] [place], [place] [address] Phone: [phone] Fax: [phone].

## 2012-03-20 NOTE — Patient Instructions (Signed)
Please decrease primidone to one tablet at night for 1 month, then stop.

## 2012-05-01 DIAGNOSIS — T85398A Other mechanical complication of other ocular prosthetic devices, implants and grafts, initial encounter: Secondary | ICD-10-CM | POA: Diagnosis not present

## 2012-05-01 DIAGNOSIS — H269 Unspecified cataract: Secondary | ICD-10-CM | POA: Insufficient documentation

## 2012-05-01 DIAGNOSIS — H2510 Age-related nuclear cataract, unspecified eye: Secondary | ICD-10-CM | POA: Diagnosis not present

## 2012-05-03 DIAGNOSIS — Z79899 Other long term (current) drug therapy: Secondary | ICD-10-CM | POA: Diagnosis not present

## 2012-05-03 DIAGNOSIS — E7800 Pure hypercholesterolemia, unspecified: Secondary | ICD-10-CM | POA: Diagnosis not present

## 2012-05-03 DIAGNOSIS — E119 Type 2 diabetes mellitus without complications: Secondary | ICD-10-CM | POA: Diagnosis not present

## 2012-05-03 DIAGNOSIS — K219 Gastro-esophageal reflux disease without esophagitis: Secondary | ICD-10-CM | POA: Diagnosis not present

## 2012-05-03 DIAGNOSIS — E039 Hypothyroidism, unspecified: Secondary | ICD-10-CM | POA: Diagnosis not present

## 2012-05-03 DIAGNOSIS — N951 Menopausal and female climacteric states: Secondary | ICD-10-CM | POA: Diagnosis not present

## 2012-05-23 ENCOUNTER — Encounter: Payer: Self-pay | Admitting: Neurology

## 2012-05-23 ENCOUNTER — Ambulatory Visit (INDEPENDENT_AMBULATORY_CARE_PROVIDER_SITE_OTHER): Payer: Medicare Other | Admitting: Neurology

## 2012-05-23 ENCOUNTER — Other Ambulatory Visit: Payer: Self-pay | Admitting: Physician Assistant

## 2012-05-23 VITALS — BP 120/74 | HR 60 | Wt 183.0 lb

## 2012-05-23 DIAGNOSIS — L919 Hypertrophic disorder of the skin, unspecified: Secondary | ICD-10-CM | POA: Diagnosis not present

## 2012-05-23 DIAGNOSIS — G4730 Sleep apnea, unspecified: Secondary | ICD-10-CM

## 2012-05-23 DIAGNOSIS — L570 Actinic keratosis: Secondary | ICD-10-CM | POA: Diagnosis not present

## 2012-05-23 DIAGNOSIS — D239 Other benign neoplasm of skin, unspecified: Secondary | ICD-10-CM | POA: Diagnosis not present

## 2012-05-23 DIAGNOSIS — D485 Neoplasm of uncertain behavior of skin: Secondary | ICD-10-CM | POA: Diagnosis not present

## 2012-05-23 DIAGNOSIS — L909 Atrophic disorder of skin, unspecified: Secondary | ICD-10-CM | POA: Diagnosis not present

## 2012-05-23 MED ORDER — PRIMIDONE 50 MG PO TABS: ORAL_TABLET | ORAL | Status: DC

## 2012-05-23 NOTE — Patient Instructions (Addendum)
Your appointment with [facility] is tomorrow, August 14 at 9:30am with Dr. Shelle Iron at [address] 2nd floor  [phone].  We will contact you for your three month follow up appointment with Dr. Arbutus Leas.

## 2012-05-23 NOTE — Progress Notes (Signed)
Dear Dr. Venetia Maxon,  I saw  Laura Farrell back in [place] [facility] for her problem with essential tremor.  At her last visit, I tapered her off her 100mg  of primidone as she was complaining of fatigue.  I increased her propranolol to 60mg  bid.  She feels that the tremor got somewhat better, but has been bothering her worse over the last two weeks.  She thinks she may be drinking more caffeine.  Sometimes it is hard to hold things in her left hand where the tremor seems to be worse.  She has had one day of dizziness recently but has not had lightheadedness with the propranolol.  She continues to complain of fatigue despite being of the primidone.  She snores.  She does awake rested, but is fatigued after lunch and for the rest of the day.  She does not nap.  She awakes frequently throughout the night for now reason.  No apneas noted.    Medical history, social history, and family history were reviewed and have not changed since the last clinic visit.  Current Outpatient Prescriptions on File Prior to Visit  Medication Sig Dispense Refill  . aspirin 325 MG tablet Take 325 mg by mouth daily.      Marland Kitchen gabapentin (NEURONTIN) 600 MG tablet Take 600 mg by mouth. One twice a day for hot flashes.      . levothyroxine (SYNTHROID, LEVOTHROID) 88 MCG tablet Take 88 mcg by mouth daily.      . metroNIDAZOLE (METROCREAM) 0.75 % cream Apply topically. Prn for face      . omeprazole (PRILOSEC) 40 MG capsule Take 40 mg by mouth daily.      . prednisoLONE acetate (PRED FORTE) 1 % ophthalmic suspension Place 1 drop into the left eye. Once daily      . propranolol (INDERAL) 20 MG tablet Take 3 tablets (60 mg total) by mouth 2 (two) times daily.  180 tablet  3  . simvastatin (ZOCOR) 40 MG tablet Take 40 mg by mouth every evening.      . venlafaxine (EFFEXOR) 25 MG tablet Take 25 mg by mouth 2 (two) times daily.        No Known Allergies  ROS:  13 systems were reviewed and  are  unremarkable.  Exam: . Filed Vitals:   05/23/12 0935  BP: 120/74  Pulse: 60  Weight: 183 lb (83.008 kg)    In general, well appearing older women.    Motor:  Fine postural tremor, worse in left hand.  No significant bradykinesia or rigidity.  No resting or action component.    Impression/Recommendations:  1.  Essential tremor - I think we are at the maximum dose of propranolol of 60 bid given her HR of 60 today.  I would favor restarting primidone because it has not clearly caused her fatigue.  I would increase it to a low dose of 100mg  qhs for now. 2.  Fatigue - I think she may be at risk of OSA.  I will refer her to pulmonology likely for a home sleep study for now.  After this evaluation is complete and if pulmonary thinks it is reasonable she will start the primidone at night.  She will follow up with Dr. Lurena Joiner Tat.  Lupita Raider Modesto Charon, MD [facility], [place]

## 2012-05-24 ENCOUNTER — Ambulatory Visit (INDEPENDENT_AMBULATORY_CARE_PROVIDER_SITE_OTHER): Payer: Medicare Other | Admitting: Pulmonary Disease

## 2012-05-24 ENCOUNTER — Encounter: Payer: Self-pay | Admitting: Pulmonary Disease

## 2012-05-24 VITALS — BP 120/64 | HR 58 | Temp 98.3°F | Ht 64.0 in | Wt 186.2 lb

## 2012-05-24 DIAGNOSIS — L859 Epidermal thickening, unspecified: Secondary | ICD-10-CM | POA: Insufficient documentation

## 2012-05-24 DIAGNOSIS — G4733 Obstructive sleep apnea (adult) (pediatric): Secondary | ICD-10-CM | POA: Diagnosis not present

## 2012-05-24 NOTE — Assessment & Plan Note (Signed)
The patient is complaining primarily of tiredness, and not necessarily sleepiness.  However, she does have a history of loud snoring, and is unsure if she is rested in the mornings upon arising.  She has also gained 15 pounds recently which correlates with her worsening symptoms.  At this point, I think she needs to have a sleep study to put the issue of sleep apnea to rest.  I think she is a good candidate for home sleep testing.

## 2012-05-24 NOTE — Progress Notes (Signed)
  Subjective:    Patient ID: Laura Farrell, female    DOB: 1946/08/13, 66 y.o.   MRN: [id number]  HPI The patient is a 66 year old female who been asked to see for possible sleep apnea.  She has been having issues with significant daytime fatigue, and also has a history of loud snoring.  Her bed partner has never commented on an abnormal breathing pattern during sleep.  The patient awakens 2-3 times a night, and is unsure if she is rested upon arising.  She denies any daytime sleepiness with inactivity, and her efforts score is zero.  However, she does note significant tiredness that has been worsening recently.  She has been tried off one of her sedating medications, but has not seen a big difference.  She denies any kicking during the night, and she has not had any symptoms suggestive of the restless leg syndrome.  Her weight has increased 15 pounds over the last year or so.  Sleep Questionnaire: What time do you typically go to bed?( Between what hours) 10-11pm How long does it take you to fall asleep? 15-30 minutes How many times during the night do you wake up? 3 What time do you get out of bed to start your day? 0700 Do you drive or operate heavy machinery in your occupation? No How much has your weight changed (up or down) over the past two years? (In pounds) 15 lb (6.804 kg) Have you ever had a sleep study before? No Do you currently use CPAP? No Do you wear oxygen at any time? No    Review of Systems  Constitutional: Positive for unexpected weight change. Negative for fever.  HENT: Negative for ear pain, nosebleeds, congestion, sore throat, rhinorrhea, sneezing, trouble swallowing, dental problem, postnasal drip and sinus pressure.   Eyes: Negative for redness and itching.  Respiratory: Negative for cough, chest tightness, shortness of breath and wheezing.   Cardiovascular: Negative for palpitations and leg swelling.  Gastrointestinal: Negative for nausea and vomiting.  Genitourinary:  Negative for dysuria.  Musculoskeletal: Negative for joint swelling.  Skin: Negative for rash.  Neurological: Negative for headaches.  Hematological: Does not bruise/bleed easily.  Psychiatric/Behavioral: Negative for dysphoric mood. The patient is not nervous/anxious.   All other systems reviewed and are negative.       Objective:   Physical Exam Constitutional:  Overweight female, no acute distress  HENT:  Nares patent without discharge  Oropharynx without exudate, palate and uvula mildly elongated.   Eyes:  Perrla, eomi, no scleral icterus  Neck:  No JVD, no TMG  Cardiovascular:  Normal rate, regular rhythm, no rubs or gallops.  No murmurs        Intact distal pulses  Pulmonary :  Normal breath sounds, no stridor or respiratory distress   No rales, rhonchi, or wheezing  Abdominal:  Soft, nondistended, bowel sounds present.  No tenderness noted.   Musculoskeletal:  No lower extremity edema noted.  Lymph Nodes:  No cervical lymphadenopathy noted  Skin:  No cyanosis noted  Neurologic:  Alert, appropriate, moves all 4 extremities without obvious deficit.         Assessment & Plan:

## 2012-05-24 NOTE — Patient Instructions (Addendum)
Will schedule for home sleep testing, and call you with the results. Work on weight loss.

## 2012-05-29 ENCOUNTER — Telehealth: Payer: Self-pay | Admitting: Pulmonary Disease

## 2012-05-29 ENCOUNTER — Ambulatory Visit (INDEPENDENT_AMBULATORY_CARE_PROVIDER_SITE_OTHER): Payer: Medicare Other | Admitting: Pulmonary Disease

## 2012-05-29 DIAGNOSIS — G4733 Obstructive sleep apnea (adult) (pediatric): Secondary | ICD-10-CM

## 2012-05-29 NOTE — Telephone Encounter (Signed)
Lawson Fiscal, please arrange for ov to discuss sleep test results.  Thanks.

## 2012-05-31 ENCOUNTER — Telehealth: Payer: Self-pay | Admitting: Neurology

## 2012-05-31 NOTE — Telephone Encounter (Signed)
Pt called for results for sleep study. She also states that she isn't feeling any better.

## 2012-05-31 NOTE — Telephone Encounter (Signed)
She should follow with Dr. Shelle Iron re the results of the sleep study.  It looks like from Epic she should receive a call about a follow up visit with him about this.

## 2012-05-31 NOTE — Telephone Encounter (Signed)
Spoke with the patient. Information given as per Dr. Modesto Charon below. I let her know that it appears that someone will be contacting her to schedule a f/u. She will call the office to inquire.

## 2012-06-02 ENCOUNTER — Telehealth: Payer: Self-pay | Admitting: Pulmonary Disease

## 2012-06-02 NOTE — Telephone Encounter (Signed)
Called and spoke with pt and appt has been made for the pt for Monday afternoon at 2:30 to discuss her sleep study results with KC.  Pt voiced her understanding of this appt. Nothing further is needed.

## 2012-06-05 ENCOUNTER — Ambulatory Visit (INDEPENDENT_AMBULATORY_CARE_PROVIDER_SITE_OTHER): Payer: Medicare Other | Admitting: Pulmonary Disease

## 2012-06-05 ENCOUNTER — Encounter: Payer: Self-pay | Admitting: Pulmonary Disease

## 2012-06-05 VITALS — BP 112/70 | HR 59 | Temp 98.0°F | Ht 64.0 in | Wt 185.4 lb

## 2012-06-05 DIAGNOSIS — G4733 Obstructive sleep apnea (adult) (pediatric): Secondary | ICD-10-CM

## 2012-06-05 NOTE — Patient Instructions (Addendum)
Work on weight loss for next 3 mos. Please call me to give me update on your progress.

## 2012-06-05 NOTE — Assessment & Plan Note (Signed)
The patient has mild obstructive sleep apnea by her recent sleep test, but it is unclear if this is the etiology for her daytime fatigue.  I have outlined a conservative and aggressive treatment option, one being a trial of weight loss over the next 3-6 months, and the other being a trial of CPAP to see if she would improve.  At least this is not a medical issue for her, given its mild nature.  The patient at this time would like to take the next 3 months and work aggressively on weight loss before considering more aggressive treatment.

## 2012-06-05 NOTE — Progress Notes (Signed)
  Subjective:    Patient ID: Laura Farrell, female    DOB: [birth year], 66 y.o.   MRN: [id number]  HPI Patient comes in today for followup after her recent sleep test, as part of a workup for snoring and daytime fatigue.  This revealed mild obstructive sleep apnea, with an AHI of 12 events per hour.  I have reviewed the study with her in detail, and answered all of her questions.   Review of Systems  Constitutional: Negative for fever and unexpected weight change.  HENT: Negative for nosebleeds, congestion, sore throat, rhinorrhea, sneezing, trouble swallowing, dental problem, postnasal drip and sinus pressure.   Eyes: Negative for redness and itching.  Respiratory: Negative for cough, chest tightness, shortness of breath and wheezing.   Cardiovascular: Negative for palpitations and leg swelling.  Gastrointestinal: Negative for nausea and vomiting.  Genitourinary: Negative for dysuria.  Musculoskeletal: Negative for joint swelling.  Skin: Negative for rash.  Neurological: Negative for headaches.  Hematological: Does not bruise/bleed easily.  Psychiatric/Behavioral: Negative for dysphoric mood. The patient is not nervous/anxious.        Objective:   Physical Exam Overweight female in no acute distress Nose without purulence or discharge noted Lower extremities without edema, no cyanosis Alert and oriented, moves all 4 extremities.       Assessment & Plan:

## 2012-06-06 NOTE — Telephone Encounter (Signed)
Pt came in on 06-05-12 and discussed. Carron Curie, CMA

## 2012-06-28 ENCOUNTER — Encounter: Payer: Self-pay | Admitting: Family Medicine

## 2012-06-28 ENCOUNTER — Ambulatory Visit (INDEPENDENT_AMBULATORY_CARE_PROVIDER_SITE_OTHER): Payer: Medicare Other | Admitting: Family Medicine

## 2012-06-28 VITALS — BP 120/72 | HR 72 | Temp 98.0°F | Resp 12 | Ht 63.25 in | Wt 182.0 lb

## 2012-06-28 DIAGNOSIS — E785 Hyperlipidemia, unspecified: Secondary | ICD-10-CM | POA: Diagnosis not present

## 2012-06-28 DIAGNOSIS — Z8669 Personal history of other diseases of the nervous system and sense organs: Secondary | ICD-10-CM | POA: Insufficient documentation

## 2012-06-28 DIAGNOSIS — Z23 Encounter for immunization: Secondary | ICD-10-CM

## 2012-06-28 DIAGNOSIS — E119 Type 2 diabetes mellitus without complications: Secondary | ICD-10-CM

## 2012-06-28 DIAGNOSIS — E039 Hypothyroidism, unspecified: Secondary | ICD-10-CM | POA: Diagnosis not present

## 2012-06-28 DIAGNOSIS — Z299 Encounter for prophylactic measures, unspecified: Secondary | ICD-10-CM

## 2012-06-28 DIAGNOSIS — K219 Gastro-esophageal reflux disease without esophagitis: Secondary | ICD-10-CM

## 2012-06-28 HISTORY — DX: Personal history of other diseases of the nervous system and sense organs: Z86.69

## 2012-06-28 LAB — BASIC METABOLIC PANEL
CO2: 25 mEq/L (ref 19–32)
Glucose, Bld: 96 mg/dL (ref 70–99)
Potassium: 4.1 mEq/L (ref 3.5–5.1)
Sodium: 139 mEq/L (ref 135–145)

## 2012-06-28 LAB — HEPATIC FUNCTION PANEL
Bilirubin, Direct: 0.1 mg/dL (ref 0.0–0.3)
Total Bilirubin: 0.9 mg/dL (ref 0.3–1.2)

## 2012-06-28 LAB — LIPID PANEL
HDL: 40.9 mg/dL (ref >39.00)
Total CHOL/HDL Ratio: 4
VLDL: 44 mg/dL — ABNORMAL HIGH (ref 0.0–40.0)

## 2012-06-28 LAB — HEMOGLOBIN A1C: Hgb A1c MFr Bld: 6.3 % (ref 4.6–6.5)

## 2012-06-28 MED ORDER — TETANUS-DIPHTH-ACELL PERTUSSIS 5-2.5-18.5 LF-MCG/0.5 IM SUSP: 0.5000 mL | Freq: Once | INTRAMUSCULAR | Status: DC

## 2012-06-28 NOTE — Progress Notes (Signed)
Subjective:    Patient ID: Laura Farrell, female    DOB: 06-13-1946, 66 y.o.   MRN: [id number]  HPI  Patient is seen to establish care. She has past medical history significant for type 2 diabetes(just diagnosed reportedly within the past few months, hyperlipidemia, GERD, hypothyroidism, obstructive sleep apnea, and essential tremor. She's had significant postmenopausal hot flashes and is taking gabapentin and Effexor for this. She is reluctant to taper. She is followed by pulmonary regarding her obstructive sleep apnea. Essential tremor has been treated for several years with Mysoline and Inderal and is stable. No symptoms of hyperglycemia.   Multiple surgeries as previously outlined.  Has had previous hysterectomy. These are reviewed. Father had lung cancer. Mother had pancreatic cancer.  Patient is married. She is retired. Nonsmoker. No alcohol use.  Immunizations reviewed. Last tetanus unknown. She's had previous Pneumovax and shingles vaccine. No flu vaccine yet.  Past Medical History  Diagnosis Date  . Hot flashes   . Over weight   . HLP (hyperkeratosis lenticularis perstans)   . Diabetes mellitus   . Headache   . Hyperlipidemia   . Thyroid disease   . Urine incontinence    Past Surgical History  Procedure Date  . Abdominal hysterectomy   . Left shoulder   . Lower back surgery   . Cornea implant   . Tubal ligation 1980  . Vesicovaginal fistula closure w/ tah 1995  . Spine surgery 2005  . Appendectomy     reports that she has never smoked. She has never used smokeless tobacco. She reports that she does not drink alcohol or use illicit drugs. family history includes Cancer in her father; Pancreatic cancer in her mother; and Tremor in her brother, father, mother, and sister. No Known Allergies    Review of Systems  Constitutional: Negative for appetite change, fatigue and unexpected weight change.  HENT: Negative for hearing loss.   Eyes: Negative for visual  disturbance.  Respiratory: Negative for cough, chest tightness, shortness of breath and wheezing.   Cardiovascular: Negative for chest pain, palpitations and leg swelling.  Gastrointestinal: Negative for nausea, vomiting and abdominal pain.  Genitourinary: Negative for dysuria.  Skin: Negative for rash.  Neurological: Negative for dizziness, seizures, syncope, weakness, light-headedness and headaches.  Psychiatric/Behavioral: Negative for dysphoric mood.       Objective:   Physical Exam  Constitutional: She is oriented to person, place, and time. She appears well-developed and well-nourished. No distress.  HENT:  Mouth/Throat: Oropharynx is clear and moist.       Moderate cerumen both ear canals  Eyes: EOM are normal.       Hx left corneal transplant-slightly irregular pupil.  Neck: Neck supple. No thyromegaly present.  Cardiovascular: Normal rate and regular rhythm.   Pulmonary/Chest: Effort normal and breath sounds normal. No respiratory distress. She has no wheezes. She has no rales.  Musculoskeletal: She exhibits no edema.  Lymphadenopathy:    She has no cervical adenopathy.  Neurological: She is alert and oriented to person, place, and time.  Skin: No rash noted.  Psychiatric: She has a normal mood and affect. Her behavior is normal.          Assessment & Plan:  #1 type 2 diabetes. Recently diagnosed. On metformin. Check hemoglobin A1c. #2 history of hypothyroidism. Recheck TSH  #3 hyperlipidemia. Check lipid panel and hepatic panel  #4 history of GERD stable symptomatically  #5 history of obstructive sleep apnea  #6 history of essential tremor stable on  medications #7 history of postmenopausal hot flashes. Discussed possible tapering off medications and at this point she's not interested.  #8 health maintenance. Flu vaccine recommended

## 2012-06-29 LAB — LDL CHOLESTEROL, DIRECT: Direct LDL: 93.8 mg/dL

## 2012-06-30 ENCOUNTER — Other Ambulatory Visit: Payer: Self-pay | Admitting: *Deleted

## 2012-06-30 DIAGNOSIS — E039 Hypothyroidism, unspecified: Secondary | ICD-10-CM

## 2012-06-30 MED ORDER — LEVOTHYROXINE SODIUM 75 MCG PO TABS: 75.0000 ug | ORAL_TABLET | Freq: Every day | ORAL | Status: DC

## 2012-06-30 NOTE — Progress Notes (Signed)
Quick Note:  Pt informed ______ 

## 2012-07-03 ENCOUNTER — Telehealth: Payer: Self-pay | Admitting: Family Medicine
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
## 2012-07-03 NOTE — Telephone Encounter (Signed)
Pt rescheduled to see (PCP) Dr. Caryl Never instead as he had openings.  Contacted the pt and notified her of the change.

## 2012-07-04 ENCOUNTER — Ambulatory Visit (INDEPENDENT_AMBULATORY_CARE_PROVIDER_SITE_OTHER): Payer: Medicare Other | Admitting: Family Medicine

## 2012-07-04 ENCOUNTER — Ambulatory Visit: Payer: Medicare Other | Admitting: Family Medicine

## 2012-07-04 ENCOUNTER — Encounter: Payer: Self-pay | Admitting: Family Medicine

## 2012-07-04 VITALS — BP 128/78 | Temp 98.1°F | Wt 180.0 lb

## 2012-07-04 DIAGNOSIS — M542 Cervicalgia: Secondary | ICD-10-CM

## 2012-07-04 DIAGNOSIS — R51 Headache: Secondary | ICD-10-CM | POA: Diagnosis not present

## 2012-07-04 MED ORDER — METAXALONE 800 MG PO TABS: 800.0000 mg | ORAL_TABLET | Freq: Three times a day (TID) | ORAL | Status: DC
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
## 2012-07-04 NOTE — Patient Instructions (Addendum)
Continue with moist heat and try muscle massage

## 2012-07-13 ENCOUNTER — Telehealth: Payer: Self-pay | Admitting: Oncology

## 2012-07-13 NOTE — Telephone Encounter (Signed)
Pt called and wants to reschedule appt from 10/9 to 08/07/12, nurse notified

## 2012-07-19 ENCOUNTER — Ambulatory Visit: Payer: Self-pay | Admitting: Oncology

## 2012-07-25 ENCOUNTER — Encounter: Payer: Self-pay | Admitting: Family Medicine

## 2012-07-25 ENCOUNTER — Ambulatory Visit (INDEPENDENT_AMBULATORY_CARE_PROVIDER_SITE_OTHER): Payer: Medicare Other | Admitting: Family Medicine

## 2012-07-25 VITALS — BP 110/70 | Temp 97.7°F | Wt 175.0 lb

## 2012-07-25 DIAGNOSIS — S139XXA Sprain of joints and ligaments of unspecified parts of neck, initial encounter: Secondary | ICD-10-CM

## 2012-07-25 DIAGNOSIS — S161XXA Strain of muscle, fascia and tendon at neck level, initial encounter: Secondary | ICD-10-CM
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
## 2012-07-25 NOTE — Patient Instructions (Addendum)
-  do home exercises provided and continue massages  -can use tylenol if needed for pain  -use heat daily   -follow up with your doctor in one month

## 2012-07-31 ENCOUNTER — Telehealth: Payer: Self-pay | Admitting: Oncology

## 2012-07-31 NOTE — Telephone Encounter (Signed)
pt called to  ove her appt from 10/28 to 11/4

## 2012-08-07 ENCOUNTER — Ambulatory Visit: Payer: Self-pay | Admitting: Oncology

## 2012-08-14 ENCOUNTER — Telehealth: Payer: Self-pay | Admitting: Oncology

## 2012-08-14 ENCOUNTER — Ambulatory Visit (HOSPITAL_BASED_OUTPATIENT_CLINIC_OR_DEPARTMENT_OTHER): Payer: Medicare Other | Admitting: Oncology

## 2012-08-14 ENCOUNTER — Encounter: Payer: Self-pay | Admitting: Oncology

## 2012-08-14 ENCOUNTER — Telehealth: Payer: Self-pay | Admitting: *Deleted

## 2012-08-14 VITALS — BP 120/69 | HR 59 | Temp 97.1°F | Resp 20 | Ht 63.25 in | Wt 175.8 lb

## 2012-08-14 DIAGNOSIS — Z803 Family history of malignant neoplasm of breast: Secondary | ICD-10-CM | POA: Diagnosis not present

## 2012-08-14 DIAGNOSIS — N951 Menopausal and female climacteric states: Secondary | ICD-10-CM | POA: Diagnosis not present

## 2012-08-14 DIAGNOSIS — Z1239 Encounter for other screening for malignant neoplasm of breast: Secondary | ICD-10-CM

## 2012-08-14 DIAGNOSIS — Z1231 Encounter for screening mammogram for malignant neoplasm of breast: Secondary | ICD-10-CM

## 2012-08-14 NOTE — Telephone Encounter (Signed)
appts made and printed for pt  °

## 2012-08-14 NOTE — Progress Notes (Signed)
OFFICE PROGRESS NOTE   08/14/2012   Physicians: Laura Farrell (PCP), Laura Farrell (neurology), Laura Farrell  INTERVAL HISTORY:  Patient is seen, alone for visit, in scheduled yearly follow up of her high risk potential for breast cancer due to family history of premenopausal breast cancer in sister and multiple personal breast biopsies, previously treated on [facility]rell study. She has not had an actual breast cancer diagnosed to this point. On the Laura Farrell study she was initially treated with tamoxifen and then with raloxifene, completed Sept 2007. Last bilateral mammograms were at [facility] 10-25-11, still with "extremely dense" breast tissue but no other mammographic findings of concern; she had breast MRI 12-08-11 which had nothing otherwise of concern. Previously she has had gamma imaging at [facility], last Feb 2012. Patient has had no changes on breast self exam. We have discussed new imaging with 3D/tomo mammography, which has been especially helpful with dense breasts, and she would like this done with upcoming mammograms at [facility] in Jan 2014.   Patient has felt generally well recently. Essential tremor in hands is still bothersome and has not improved with medications tried by Laura Farrell to date. As Laura Farrell is no longer in [place], she is set up to see Laura Farrell next week for follow up of the tremors. Hot flashes continue intermittently, frequently still multiple times daily and very bothersome when they occur. Hot flashes have been severe since she stopped hormonal replacement in 2002, tho some better on gabapentin and effexor for past few years. She has had no fever or symptoms of infection, no pain, no respiratory or GI symptoms, no bladder complaints, no LE swelling (thick ankles).  Remainder of 10 point Review of Systems negative.  Patient would be interested in any additional study that might be available, and I have let [facility] staff know this. She has not had genetics testing done as  sister declined this, but would be interested in meeting with Laura [facility]rell genetics counselor to get her thoughts on the family history.  PCP has changed to Laura Laura Farrell, and patient is very pleased with that office. Objective:  Vital signs in last 24 hours:  BP 120/69  Pulse 59  Temp 97.1 F (36.2 C) (Oral)  Resp 20  Ht 5' 3.25" (1.607 m)  Wt 175 lb 12.8 oz (79.742 kg)  BMI 30.90 kg/m2 Weight is down ~ 17 lbs from a year ago, intentional. Easily ambulatory, appears comfortable, respirations not labored RA.   HEENT:PERRLA, sclera clear, anicteric, oropharynx clear, no lesions and neck supple with midline trachea LymphaticsCervical, supraclavicular, and axillary nodes normal. Resp: clear to auscultation bilaterally and normal percussion bilaterally Cardio: regular rate and rhythm GI: soft, non-tender; bowel sounds normal; no masses,  no organomegaly Extremities: extremities normal, atraumatic, no cyanosis or edema Neuro:slight tremor of hands Skin: generalized flushing with hot flash x1 during exam Breasts: multiple healed biopsy scars. No dominant mass, no skin or nipple findings of concern. Tissue feels dense thruout. Nothing in either axilla, no swelling in UE.  Lab Results:  Results for orders placed in visit on 06/28/12  HM MAMMOGRAPHY      Component Value Range   HM Mammogram negative    HM PAP SMEAR      Component Value Range   HM Pap smear complete hyst 1990    HM COLONOSCOPY      Component Value Range   HM Colonoscopy negative, return in 5 years    HM DEXA SCAN  Component Value Range   HM Dexa Scan normal    BASIC METABOLIC PANEL      Component Value Range   Sodium 139  135 - 145 mEq/L   Potassium 4.1  3.5 - 5.1 mEq/L   Chloride 106  96 - 112 mEq/L   CO2 25  19 - 32 mEq/L   Glucose, Bld 96  70 - 99 mg/dL   BUN 15  6 - 23 mg/dL   Creatinine, Ser 0.8  0.4 - 1.2 mg/dL   Calcium 9.6  8.4 - 16.1 mg/dL   GFR 09.60  >45.40 mL/min  LIPID PANEL      Component  Value Range   Cholesterol 162  0 - 200 mg/dL   Triglycerides 981.1 (*) 0.0 - 149.0 mg/dL   HDL 91.47  >82.95 mg/dL   VLDL 62.1 (*) 0.0 - 30.8 mg/dL   Total CHOL/HDL Ratio 4    HEPATIC FUNCTION PANEL      Component Value Range   Total Bilirubin 0.9  0.3 - 1.2 mg/dL   Bilirubin, Direct 0.1  0.0 - 0.3 mg/dL   Alkaline Phosphatase 80  39 - 117 U/L   AST 20  0 - 37 U/L   ALT 18  0 - 35 U/L   Total Protein 7.4  6.0 - 8.3 g/dL   Albumin 4.2  3.5 - 5.2 g/dL  TSH      Component Value Range   TSH 0.11 (*) 0.35 - 5.50 uIU/mL  HEMOGLOBIN A1C      Component Value Range   Hemoglobin A1C 6.3  4.6 - 6.5 %  LDL CHOLESTEROL, DIRECT      Component Value Range   Direct LDL 93.8       Studies/Results:  Bilateral screening mammograms with 3D/tomo requested for Jan 2014  Medications: I have reviewed the patient's current medications.She is concerned about possible side effects of statins. She has had flu vaccine  Assessment/Plan: 1.high risk for development of breast cancer based on dense breast tissue, family history of breast cancer in premenopausal sister and multiple previous breast biopsies. Previously treated on Laura Farrell study as above. Will image with 3D/tomo mammograms, due in Jan. I will see her back at least in a year, or sooner if needed. Genetics referral. 2.persistant hot flashes in patient who is not appropriate for hormonal suppression. Unfortunately I am not aware of anything different for these now. 3. Essential tremor: follow up with neurology 4. Hx ocular fungal infection and corneal implants 2010 5. Diabetes, hypothyroid, elevated lipids 6.post hysterectomy  Patient is in agreement with plan above.  Laura Farrell P, MD   08/14/2012, 10:15 AM

## 2012-08-14 NOTE — Telephone Encounter (Signed)
08/14/12 10:45:  Received information from Dr. Darrold Span that [name] is interested in participating in a clinical trial.  Reviewed all of our open trial and she does not fit into eligibility for any trials that are currently open.  Called patient to explain same and thanked her for her continued interest in research.

## 2012-08-14 NOTE — Patient Instructions (Signed)
We will set up the 3D/ tomo mammography for January.  Our Research nurses will let you know if any studies are available thru our contacts.

## 2012-08-21 ENCOUNTER — Encounter: Payer: Self-pay | Admitting: Family Medicine

## 2012-08-21 ENCOUNTER — Ambulatory Visit (INDEPENDENT_AMBULATORY_CARE_PROVIDER_SITE_OTHER): Payer: Medicare Other | Admitting: Family Medicine

## 2012-08-21 VITALS — BP 120/64 | Temp 98.0°F | Wt 172.0 lb

## 2012-08-21 DIAGNOSIS — M546 Pain in thoracic spine: Secondary | ICD-10-CM | POA: Diagnosis not present

## 2012-08-21 DIAGNOSIS — M549 Dorsalgia, unspecified: Secondary | ICD-10-CM

## 2012-08-21 DIAGNOSIS — Z23 Encounter for immunization: Secondary | ICD-10-CM
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
## 2012-08-22 ENCOUNTER — Telehealth: Payer: Self-pay | Admitting: Oncology

## 2012-08-22 NOTE — Telephone Encounter (Signed)
CALLED PT AND MOVED HER GENETICS APPT DUR TO [facility] CHANGE

## 2012-08-23 ENCOUNTER — Ambulatory Visit (INDEPENDENT_AMBULATORY_CARE_PROVIDER_SITE_OTHER): Payer: Medicare Other | Admitting: Neurology

## 2012-08-23 ENCOUNTER — Encounter: Payer: Self-pay | Admitting: Neurology

## 2012-08-23 ENCOUNTER — Other Ambulatory Visit: Payer: Self-pay | Admitting: Physician Assistant

## 2012-08-23 VITALS — BP 112/78 | HR 56 | Temp 97.8°F | Resp 16 | Wt 173.0 lb

## 2012-08-23 DIAGNOSIS — Z79899 Other long term (current) drug therapy: Secondary | ICD-10-CM

## 2012-08-23 DIAGNOSIS — L719 Rosacea, unspecified: Secondary | ICD-10-CM | POA: Diagnosis not present

## 2012-08-23 DIAGNOSIS — I789 Disease of capillaries, unspecified: Secondary | ICD-10-CM | POA: Diagnosis not present

## 2012-08-23 DIAGNOSIS — G250 Essential tremor: Secondary | ICD-10-CM | POA: Diagnosis not present

## 2012-08-23 DIAGNOSIS — G252 Other specified forms of tremor: Secondary | ICD-10-CM | POA: Diagnosis not present

## 2012-08-23 DIAGNOSIS — L98499 Non-pressure chronic ulcer of skin of other sites with unspecified severity: Secondary | ICD-10-CM | POA: Diagnosis not present

## 2012-08-23 DIAGNOSIS — D485 Neoplasm of uncertain behavior of skin: Secondary | ICD-10-CM | POA: Diagnosis not present

## 2012-08-23 DIAGNOSIS — L570 Actinic keratosis: Secondary | ICD-10-CM | POA: Diagnosis not present

## 2012-08-23 LAB — CBC WITH DIFFERENTIAL/PLATELET
Eosinophils Absolute: 0.1 10*3/uL (ref 0.0–0.7)
Eosinophils Relative: 1.6 % (ref 0.0–5.0)
HCT: 39.4 % (ref 36.0–46.0)
Lymphs Abs: 2.4 10*3/uL (ref 0.7–4.0)
MCHC: 32.9 g/dL (ref 30.0–36.0)
MCV: 90.6 fl (ref 78.0–100.0)
Monocytes Absolute: 0.3 10*3/uL (ref 0.1–1.0)
Neutrophils Relative %: 42.5 % — ABNORMAL LOW (ref 43.0–77.0)
Platelets: 150 10*3/uL (ref 150.0–400.0)
WBC: 4.9 10*3/uL (ref 4.5–10.5)

## 2012-08-23 LAB — VITAMIN B12: Vitamin B-12: >1500 pg/mL — ABNORMAL HIGH (ref 211–911)

## 2012-08-23 NOTE — Progress Notes (Signed)
Subjective:   Laura Farrell was seen in consultation in the movement disorder f/u.  The patient has previously seen Dr. Modesto Charon but since he is no longer with the practice, I am now seeing the patient.  I had the opportunity to review Dr. Nash Dimmer notes.  The patient reports that she has had tremor for about 20 years.  It involves both hands, but the L seems worse than the R even though she is R handed.    The patient does have a history of essential tremor.  She was on   Primidone, but that was discontinued because of fatigue.  However, but fatigue continued after the primidone was discontinued so they decided that it was likely not from the primidone.  Her primidone was restarted and tremor improved.  She is also on propranolol 60 mg twice a day.  She has been on propranolol for years and is not sure that it helps.  She has no SE with medication.  She ate soup yesterday and had no trouble and that is new for her.  Her brother has ET as well and both parents did, as well as aunts and uncles on on both sides.  She drinks no caffeine and no alcohol so doesn't know effect on tremor.  In the past, she did drink caffeinated soda and it made things worse.  Since last visit, the patient has been diagnosed with mild sleep apnea.  She promised the dr that she would lose weight and she has already lost 10 pds.    Current/Previously tried tremor medications: primidone/propranolol  Current medications that may exacerbate tremor:  none    No Known Allergies  Current Outpatient Prescriptions on File Prior to Visit  Medication Sig Dispense Refill  . aspirin 325 MG tablet Take 325 mg by mouth daily.      Marland Kitchen etodolac (LODINE) 400 MG tablet 400 mg as needed.       . gabapentin (NEURONTIN) 600 MG tablet Take 600 mg by mouth. One twice a day for hot flashes.      . levothyroxine (SYNTHROID, LEVOTHROID) 75 MCG tablet Take 1 tablet (75 mcg total) by mouth daily.  90 tablet  3  . metFORMIN (GLUCOPHAGE) 500 MG tablet  Take 500 mg by mouth 2 (two) times daily with a meal.      . metroNIDAZOLE (METROCREAM) 0.75 % cream Apply 1 application topically daily as needed.      Marland Kitchen omeprazole (PRILOSEC) 40 MG capsule Take 40 mg by mouth daily.      . prednisoLONE acetate (PRED FORTE) 1 % ophthalmic suspension Place 1 drop into the left eye. Once daily      . primidone (MYSOLINE) 50 MG tablet 2 tabs at bedtime      . propranolol (INDERAL) 20 MG tablet Take 3 tablets (60 mg total) by mouth 2 (two) times daily.  180 tablet  3  . simvastatin (ZOCOR) 40 MG tablet Take 40 mg by mouth every evening.      . venlafaxine (EFFEXOR) 25 MG tablet Take 25 mg by mouth 2 (two) times daily.        Past Medical History  Diagnosis Date  . Hot flashes   . Over weight   . HLP (hyperkeratosis lenticularis perstans)   . Diabetes mellitus   . Headache   . Hyperlipidemia   . Thyroid disease   . Urine incontinence     Past Surgical History  Procedure Date  . Abdominal hysterectomy   .  Left shoulder   . Lower back surgery   . Cornea implant   . Tubal ligation 1980  . Vesicovaginal fistula closure w/ tah 1995  . Spine surgery 2005  . Appendectomy     History   Social History  . Marital Status: Married    Spouse Name: N/A    Number of Children: N/A  . Years of Education: N/A   Occupational History  . Retired Press photographer    Social History Main Topics  . Smoking status: Never Smoker   . Smokeless tobacco: Never Used  . Alcohol Use: No  . Drug Use: No  . Sexually Active: Not on file   Other Topics Concern  . Not on file   Social History Narrative  . No narrative on file    Family Status  Relation Status Death Age  . Mother Deceased     pancreatic CA  . Father Deceased     lung CA (asbestos exposure)  . Sister Alive     2, breast CA  . Brother Alive     healthy    Review of Systems A complete 10 system ROS was obtained and was negative apart from what is mentioned.   Objective:   VITALS:   Filed  Vitals:   08/23/12 0926  BP: 112/78  Pulse: 56  Temp: 97.8 F (36.6 C)  Resp: 16  Weight: 173 lb (78.472 kg)   Gen:  Appears stated age and in NAD. HEENT:  Normocephalic, atraumatic. The mucous membranes are moist. The superficial temporal arteries are without ropiness or tenderness. Cardiovascular: Regular rate and rhythm. Lungs: Clear to auscultation bilaterally. Neck: There are no carotid bruits noted bilaterally.  NEUROLOGICAL:  Orientation:  The patient is alert and oriented x 3.  Recent and remote memory are intact.  Attention span and concentration are normal.  Able to name objects and repeat without trouble.  Fund of knowledge is appropriate Cranial nerves: There is good facial symmetry. The pupils are equal round and reactive to light bilaterally. Fundoscopic exam reveals clear disc margins bilaterally. Extraocular muscles are intact and visual fields are full to confrontational testing. Speech is fluent and clear. Soft palate rises symmetrically and there is no tongue deviation. Hearing is intact to conversational tone. Tone: Tone is good throughout. Sensation: Sensation is intact to light touch and pinprick throughout (facial, trunk, extremities). Vibration is intact at the bilateral big toe. There is no extinction with double simultaneous stimulation. There is no sensory dermatomal level identified. Coordination:  The patient has no dysdiadichokinesia or dysmetria. Motor: Strength is 5/5 in the bilateral upper and lower extremities.  Shoulder shrug is equal bilaterally.  There is no pronator drift.  There are no fasciculations noted. DTR's: Deep tendon reflexes are 2/4 at the bilateral biceps, triceps, brachioradialis, patella and achilles.  Plantar responses are downgoing bilaterally. Gait and Station: The patient is able to ambulate without difficulty. The patient is able to heel toe walk without any difficulty. The patient is able to ambulate in a tandem fashion. The patient is  able to stand in the Romberg position.   MOVEMENT EXAM: Tremor:  There is virtually no tremor in the UE.  The patient is  able to draw Archimedes spirals without significant difficulty.  There is no tremor at rest.  The patient is  able to pour water from one glass to another without spilling it.  LABS:  Lab Results  Component Value Date   WBC 6.7 07/22/2010   HGB 13.2  07/22/2010   HCT 38.3 07/22/2010   MCV 88.2 07/22/2010   PLT 168 07/22/2010     Chemistry      Component Value Date/Time   NA 139 06/28/2012 1505   K 4.1 06/28/2012 1505   CL 106 06/28/2012 1505   CO2 25 06/28/2012 1505   BUN 15 06/28/2012 1505   CREATININE 0.8 06/28/2012 1505      Component Value Date/Time   CALCIUM 9.6 06/28/2012 1505   ALKPHOS 80 06/28/2012 1505   AST 20 06/28/2012 1505   ALT 18 06/28/2012 1505   BILITOT 0.9 06/28/2012 1505     Lab Results  Component Value Date   TSH 0.11* 06/28/2012   No results found for this basename: [id number]   Lab Results  Component Value Date   HGBA1C 6.3 06/28/2012       Assessment:    Essential tremor. I agree with the diagnosis.  She has had a long-standing history of this which has gradually progressed and has a strong family history of essential tremor.   Plan:   1.  I am going to continue her with the primidone, 50 mg twice a day. Risks, benefits, side effects and alternative therapies were discussed.  The opportunity to ask questions was given and they were answered to the best of my ability.  The patient expressed understanding and willingness to follow the outlined treatment protocols. 2.  We are going to attempt to slowly wean the propranolol.  If her tremor increases, she is to let me know.  She is to monitor her blood pressure closely to make sure that it is not rebound.  She is currently on 3 tablets twice a day and she would decrease to 2 tablets twice a day for 2 weeks and then go down to one tablet twice a day.  She will remain there until she sees me  next visit. 3.  We discussed the diagnosis as well as pathophysiology of the disease.  We discussed treatment options as well as prognostic indicators.  Patient education was provided. 4.  Time in room was 30 min. 5.  Return in about 2 months (around 10/25/2012).

## 2012-08-23 NOTE — Patient Instructions (Addendum)
1.  Decrease propranolol to 2 tablets twice a day for 2 weeks and then go down to 1 tablet twice a day until you see me next. 2.  Monitor your BP at local pharmacy to make sure its okay as we go off of medication

## 2012-08-28 ENCOUNTER — Encounter: Payer: Medicare Other | Admitting: Genetic Counselor

## 2012-08-28 ENCOUNTER — Other Ambulatory Visit: Payer: Medicare Other | Admitting: Lab

## 2012-09-12 ENCOUNTER — Telehealth: Payer: Self-pay | Admitting: Pulmonary Disease

## 2012-09-12 ENCOUNTER — Encounter: Payer: Self-pay | Admitting: Family Medicine

## 2012-09-12 ENCOUNTER — Ambulatory Visit (INDEPENDENT_AMBULATORY_CARE_PROVIDER_SITE_OTHER): Payer: Medicare Other | Admitting: Family Medicine

## 2012-09-12 VITALS — BP 120/80 | Temp 98.0°F | Wt 169.0 lb

## 2012-09-12 DIAGNOSIS — R300 Dysuria: Secondary | ICD-10-CM

## 2012-09-12 DIAGNOSIS — M546 Pain in thoracic spine: Secondary | ICD-10-CM | POA: Diagnosis not present

## 2012-09-12 DIAGNOSIS — R339 Retention of urine, unspecified: Secondary | ICD-10-CM

## 2012-09-12 DIAGNOSIS — M549 Dorsalgia, unspecified: Secondary | ICD-10-CM

## 2012-09-12 LAB — POCT URINALYSIS DIPSTICK
Blood, UA: NEGATIVE
Nitrite, UA: NEGATIVE
pH, UA: 5

## 2012-09-12 NOTE — Patient Instructions (Addendum)
Follow up if you begin to experience burning or pain with urination, or you develop a fever. Continue with deep tissue massage and home exercises for your shoulder, and follow up if you feel that this no longer remains effective.
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
## 2012-09-12 NOTE — Telephone Encounter (Signed)
Will forward to [facility] and sign off on per protocol of FYI messages.

## 2012-09-14 NOTE — Progress Notes (Signed)
Quick Note:  Pt informed ______ 

## 2012-09-29 ENCOUNTER — Telehealth: Payer: Self-pay | Admitting: Genetic Counselor

## 2012-09-29 NOTE — Telephone Encounter (Signed)
Pt needed to r/s her  Genetic appt. To 12/11/12@10 :00

## 2012-10-02 ENCOUNTER — Other Ambulatory Visit: Payer: Medicare Other | Admitting: Lab

## 2012-10-02 ENCOUNTER — Encounter: Payer: Medicare Other | Admitting: Genetic Counselor

## 2012-10-17 ENCOUNTER — Encounter: Payer: Self-pay | Admitting: Family Medicine

## 2012-10-17 ENCOUNTER — Ambulatory Visit (INDEPENDENT_AMBULATORY_CARE_PROVIDER_SITE_OTHER): Payer: Medicare Other | Admitting: Family Medicine

## 2012-10-17 VITALS — BP 122/82 | Temp 98.0°F | Wt 168.0 lb

## 2012-10-17 DIAGNOSIS — M549 Dorsalgia, unspecified: Secondary | ICD-10-CM

## 2012-10-17 DIAGNOSIS — E119 Type 2 diabetes mellitus without complications: Secondary | ICD-10-CM | POA: Diagnosis not present

## 2012-10-17 DIAGNOSIS — E785 Hyperlipidemia, unspecified: Secondary | ICD-10-CM

## 2012-10-17 DIAGNOSIS — M546 Pain in thoracic spine: Secondary | ICD-10-CM

## 2012-10-17 DIAGNOSIS — K219 Gastro-esophageal reflux disease without esophagitis: Secondary | ICD-10-CM

## 2012-10-17 DIAGNOSIS — E039 Hypothyroidism, unspecified: Secondary | ICD-10-CM

## 2012-10-17 LAB — TSH: TSH: 0.53 u[IU]/mL (ref 0.35–5.50)

## 2012-10-17 LAB — HEMOGLOBIN A1C: Hgb A1c MFr Bld: 6.2 % (ref 4.6–6.5)
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
## 2012-10-18 NOTE — Progress Notes (Signed)
Quick Note:  Pt informed ______ 

## 2012-10-24 DIAGNOSIS — L820 Inflamed seborrheic keratosis: Secondary | ICD-10-CM | POA: Diagnosis not present

## 2012-10-24 DIAGNOSIS — D235 Other benign neoplasm of skin of trunk: Secondary | ICD-10-CM | POA: Diagnosis not present

## 2012-10-25 ENCOUNTER — Ambulatory Visit
Admission: RE | Admit: 2012-10-25 | Discharge: 2012-10-25 | Disposition: A | Discharge status: Home / Self Care | Payer: Medicare Other | Source: Ambulatory Visit | Attending: Oncology | Admitting: Oncology

## 2012-10-25 ENCOUNTER — Other Ambulatory Visit: Payer: Self-pay | Admitting: *Deleted

## 2012-10-25 DIAGNOSIS — Z1231 Encounter for screening mammogram for malignant neoplasm of breast: Secondary | ICD-10-CM

## 2012-10-25 MED ORDER — METFORMIN HCL 500 MG PO TABS: 500.0000 mg | ORAL_TABLET | Freq: Two times a day (BID) | ORAL | Status: DC

## 2012-11-06 DIAGNOSIS — T85398A Other mechanical complication of other ocular prosthetic devices, implants and grafts, initial encounter: Secondary | ICD-10-CM | POA: Diagnosis not present

## 2012-11-06 DIAGNOSIS — H2510 Age-related nuclear cataract, unspecified eye: Secondary | ICD-10-CM | POA: Diagnosis not present

## 2012-11-13 ENCOUNTER — Encounter: Payer: Self-pay | Admitting: Neurology

## 2012-11-13 ENCOUNTER — Ambulatory Visit (INDEPENDENT_AMBULATORY_CARE_PROVIDER_SITE_OTHER): Payer: Medicare Other | Admitting: Neurology

## 2012-11-13 VITALS — BP 140/84 | HR 68 | Temp 98.2°F | Resp 18 | Wt 162.0 lb

## 2012-11-13 DIAGNOSIS — G252 Other specified forms of tremor: Secondary | ICD-10-CM | POA: Diagnosis not present

## 2012-11-13 DIAGNOSIS — G250 Essential tremor: Secondary | ICD-10-CM

## 2012-11-13 LAB — COMPREHENSIVE METABOLIC PANEL
AST: 18 U/L (ref 0–37)
Albumin: 4.1 g/dL (ref 3.5–5.2)
Alkaline Phosphatase: 67 U/L (ref 39–117)
Potassium: 4.4 mEq/L (ref 3.5–5.1)
Sodium: 140 mEq/L (ref 135–145)
Total Bilirubin: 0.5 mg/dL (ref 0.3–1.2)
Total Protein: 7.2 g/dL (ref 6.0–8.3)

## 2012-11-13 NOTE — Patient Instructions (Addendum)
1.  Decrease propranolol to once per day for a week and then stop the medication 2.  We will get your labs today 3.  Follow up in 5 months

## 2012-11-13 NOTE — Progress Notes (Signed)
Subjective:    I had the pleasure of seeing the patient back in movement d/o clinic for f/u on ET.  The patient reports that she has had tremor for about 20 years.  It involves both hands, but the L seems worse than the R even though she is R handed.    She is on primidone 50 mg, 2 tablets at night.  I weaned her down on propranolol last visit as we were not sure it was helping.  She was taking 60 mg bid and is now on 20 mg twice a day.  She is noted no difference with the decreased dosage of propranolol.  Tremor has been about the same.  Overall, she is pleased with her current degree of tremor control.   Since last visit, the patient has been diagnosed with mild sleep apnea.  She promised the dr that she would lose weight and she has continued to do that through diet changes..    Current/Previously tried tremor medications: primidone/propranolol  Current medications that may exacerbate tremor:  none    No Known Allergies  Current Outpatient Prescriptions on File Prior to Visit  Medication Sig Dispense Refill  . aspirin 81 MG tablet Take 81 mg by mouth daily.      Marland Kitchen gabapentin (NEURONTIN) 600 MG tablet Take 600 mg by mouth. One twice a day for hot flashes.      . levothyroxine (SYNTHROID, LEVOTHROID) 75 MCG tablet Take 1 tablet (75 mcg total) by mouth daily.  90 tablet  3  . metFORMIN (GLUCOPHAGE) 500 MG tablet Take 1 tablet (500 mg total) by mouth 2 (two) times daily with a meal.  180 tablet  3  . metroNIDAZOLE (METROCREAM) 0.75 % cream Apply 1 application topically daily as needed.      . minocycline (MINOCIN,DYNACIN) 50 MG capsule Take 50 mg by mouth as needed. Per dermatologist as needed when face breaks out      . omeprazole (PRILOSEC) 40 MG capsule Take 40 mg by mouth daily.      . prednisoLONE acetate (PRED FORTE) 1 % ophthalmic suspension Place 1 drop into the left eye. Once daily      . primidone (MYSOLINE) 50 MG tablet 2 tabs at bedtime      . propranolol (INDERAL) 20 MG tablet  Take 20 mg by mouth 2 (two) times daily. Per Dr Stiven Kaspar, neurologist      . simvastatin (ZOCOR) 40 MG tablet Take 40 mg by mouth every evening.      . venlafaxine (EFFEXOR) 25 MG tablet Take 25 mg by mouth 2 (two) times daily.        Past Medical History  Diagnosis Date  . Hot flashes   . Over weight   . HLP (hyperkeratosis lenticularis perstans)   . Diabetes mellitus   . Headache   . Hyperlipidemia   . Thyroid disease   . Urine incontinence     Past Surgical History  Procedure Date  . Abdominal hysterectomy   . Left shoulder   . Lower back surgery   . Cornea implant   . Tubal ligation 1980  . Vesicovaginal fistula closure w/ tah 1995  . Spine surgery 2005  . Appendectomy     History   Social History  . Marital Status: Married    Spouse Name: N/A    Number of Children: N/A  . Years of Education: N/A   Occupational History  . Retired Press photographer    Social History Main Topics  .  Smoking status: Never Smoker   . Smokeless tobacco: Never Used  . Alcohol Use: No  . Drug Use: No  . Sexually Active: Not on file   Other Topics Concern  . Not on file   Social History Narrative  . No narrative on file    Family Status  Relation Status Death Age  . Mother Deceased     pancreatic CA  . Father Deceased     lung CA (asbestos exposure)  . Sister Alive     2, breast CA  . Brother Alive     healthy    Review of Systems She continues to try and lose weight through diet changes.  A complete 10 system ROS was obtained and was negative apart from what is mentioned.   Objective:   VITALS:   Filed Vitals:   11/13/12 0816  BP: 140/84  Pulse: 68  Temp: 98.2 F (36.8 C)  Resp: 18  Weight: 162 lb (73.483 kg)   Wt Readings from Last 3 Encounters:  11/13/12 162 lb (73.483 kg)  10/17/12 168 lb (76.204 kg)  09/12/12 169 lb (76.658 kg)     Gen:  Appears stated age and in NAD. HEENT:  Normocephalic, atraumatic. The mucous membranes are moist. The superficial temporal  arteries are without ropiness or tenderness. Cardiovascular: Regular rate and rhythm. Lungs: Clear to auscultation bilaterally. Neck: There are no carotid bruits noted bilaterally.  NEUROLOGICAL:  Orientation:  The patient is alert and oriented x 3.  Recent and remote memory are intact.  Attention span and concentration are normal.  Able to name objects and repeat without trouble.  Fund of knowledge is appropriate Cranial nerves: There is good facial symmetry. The pupils are equal round and reactive to light bilaterally. Fundoscopic exam reveals clear disc margins bilaterally. Extraocular muscles are intact and visual fields are full to confrontational testing. Speech is fluent and clear. Soft palate rises symmetrically and there is no tongue deviation. Hearing is intact to conversational tone. Tone: Tone is good throughout. Sensation: Sensation is intact to light touch throughout. Coordination:  The patient has no dysdiadichokinesia or dysmetria. Motor: Strength is 5/5 in the bilateral upper and lower extremities.  Shoulder shrug is equal bilaterally.  There is no pronator drift.  There are no fasciculations noted. Gait and Station: The patient is able to ambulate without difficulty.   MOVEMENT EXAM: Tremor:  There is virtually no tremor in the UE.   There is no tremor at rest.  The patient is  able to pour water from one glass to another without spilling it.  LABS:  Lab Results  Component Value Date   WBC 4.9 08/23/2012   HGB 12.9 08/23/2012   HCT 39.4 08/23/2012   MCV 90.6 08/23/2012   PLT 150.0 08/23/2012     Chemistry      Component Value Date/Time   NA 139 06/28/2012 1505   K 4.1 06/28/2012 1505   CL 106 06/28/2012 1505   CO2 25 06/28/2012 1505   BUN 15 06/28/2012 1505   CREATININE 0.8 06/28/2012 1505      Component Value Date/Time   CALCIUM 9.6 06/28/2012 1505   ALKPHOS 80 06/28/2012 1505   AST 20 06/28/2012 1505   ALT 18 06/28/2012 1505   BILITOT 0.9 06/28/2012 1505     Lab  Results  Component Value Date   TSH 0.53 10/17/2012   Lab Results  Component Value Date   VITAMINB12 >1500* 08/23/2012   Lab Results  Component Value  Date   HGBA1C 6.2 10/17/2012       Assessment:    Essential tremor. I agree with the diagnosis.  She has had a long-standing history of this which has gradually progressed and has a strong family history of essential tremor.   Plan:   1.  I am going to continue her with the primidone, 50 mg twice a day. Risks, benefits, side effects and alternative therapies were discussed.  The opportunity to ask questions was given and they were answered to the best of my ability.  The patient expressed understanding and willingness to follow the outlined treatment protocols. 2.  We are going to continue the attempt to slowly wean the propranolol. She will decrease it to one tablet daily for a week and then d/c the medication. 3.  We discussed the diagnosis as well as pathophysiology of the disease.  We discussed treatment options as well as prognostic indicators.  Patient education was provided. 4.  she will have lab work to include a Automotive engineer. 5.  Return in about 5 months (around 04/12/2013).

## 2012-11-14 DIAGNOSIS — L570 Actinic keratosis: Secondary | ICD-10-CM | POA: Diagnosis not present

## 2012-11-16 ENCOUNTER — Encounter: Payer: Self-pay | Admitting: Family Medicine

## 2012-11-16 ENCOUNTER — Ambulatory Visit (INDEPENDENT_AMBULATORY_CARE_PROVIDER_SITE_OTHER): Payer: Medicare Other | Admitting: Family Medicine

## 2012-11-16 VITALS — BP 124/80 | HR 70 | Temp 98.2°F | Wt 160.0 lb

## 2012-11-16 DIAGNOSIS — S46819A Strain of other muscles, fascia and tendons at shoulder and upper arm level, unspecified arm, initial encounter: Secondary | ICD-10-CM

## 2012-11-16 DIAGNOSIS — S43499A Other sprain of unspecified shoulder joint, initial encounter: Secondary | ICD-10-CM | POA: Diagnosis not present

## 2012-11-16 DIAGNOSIS — S46812A Strain of other muscles, fascia and tendons at shoulder and upper arm level, left arm, initial encounter: Secondary | ICD-10-CM

## 2012-11-16 NOTE — Patient Instructions (Signed)
-  We placed a referral for you as discussed to the PHYSICAL THERAPIST. It usually takes about 1-2 weeks to process and schedule this referral. If you have not heard from [place] regarding this appointment in 2 weeks please contact our office.
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
## 2012-11-27 ENCOUNTER — Ambulatory Visit: Payer: Medicare Other | Attending: Family Medicine

## 2012-11-27 DIAGNOSIS — M542 Cervicalgia: Secondary | ICD-10-CM | POA: Insufficient documentation

## 2012-11-27 DIAGNOSIS — M2569 Stiffness of other specified joint, not elsewhere classified: Secondary | ICD-10-CM | POA: Insufficient documentation

## 2012-11-27 DIAGNOSIS — R5381 Other malaise: Secondary | ICD-10-CM | POA: Insufficient documentation

## 2012-11-27 DIAGNOSIS — IMO0001 Reserved for inherently not codable concepts without codable children: Secondary | ICD-10-CM | POA: Diagnosis not present

## 2012-11-29 ENCOUNTER — Ambulatory Visit: Payer: Medicare Other

## 2012-11-29 ENCOUNTER — Ambulatory Visit: Payer: Medicare Other | Admitting: Physical Therapy

## 2012-12-01 ENCOUNTER — Other Ambulatory Visit: Payer: Self-pay | Admitting: *Deleted

## 2012-12-01 ENCOUNTER — Ambulatory Visit: Payer: Medicare Other | Admitting: Physical Therapy

## 2012-12-01 DIAGNOSIS — D0590 Unspecified type of carcinoma in situ of unspecified breast: Secondary | ICD-10-CM

## 2012-12-01 MED ORDER — VENLAFAXINE HCL 25 MG PO TABS: 25.0000 mg | ORAL_TABLET | Freq: Two times a day (BID) | ORAL | Status: DC

## 2012-12-04 ENCOUNTER — Ambulatory Visit: Payer: Medicare Other | Admitting: Physical Therapy

## 2012-12-11 ENCOUNTER — Encounter: Payer: Medicare Other | Admitting: Genetic Counselor

## 2012-12-11 ENCOUNTER — Other Ambulatory Visit: Payer: Medicare Other | Admitting: Lab

## 2012-12-14 ENCOUNTER — Ambulatory Visit: Payer: Medicare Other | Attending: Family Medicine

## 2012-12-14 DIAGNOSIS — R5381 Other malaise: Secondary | ICD-10-CM | POA: Diagnosis not present

## 2012-12-14 DIAGNOSIS — M542 Cervicalgia: Secondary | ICD-10-CM | POA: Diagnosis not present

## 2012-12-14 DIAGNOSIS — IMO0001 Reserved for inherently not codable concepts without codable children: Secondary | ICD-10-CM | POA: Diagnosis not present

## 2012-12-15 ENCOUNTER — Ambulatory Visit: Payer: Medicare Other | Admitting: Physical Therapy

## 2012-12-19 DIAGNOSIS — L570 Actinic keratosis: Secondary | ICD-10-CM | POA: Diagnosis not present

## 2012-12-20 ENCOUNTER — Ambulatory Visit: Payer: Medicare Other

## 2012-12-20 DIAGNOSIS — M542 Cervicalgia: Secondary | ICD-10-CM | POA: Diagnosis not present

## 2012-12-20 DIAGNOSIS — R5381 Other malaise: Secondary | ICD-10-CM | POA: Diagnosis not present

## 2012-12-20 DIAGNOSIS — IMO0001 Reserved for inherently not codable concepts without codable children: Secondary | ICD-10-CM | POA: Diagnosis not present

## 2012-12-21 ENCOUNTER — Encounter: Payer: Self-pay | Admitting: Family Medicine

## 2012-12-21 ENCOUNTER — Ambulatory Visit (INDEPENDENT_AMBULATORY_CARE_PROVIDER_SITE_OTHER): Payer: Medicare Other | Admitting: Family Medicine

## 2012-12-21 VITALS — BP 110/72 | Temp 97.9°F | Wt 161.0 lb

## 2012-12-21 DIAGNOSIS — G250 Essential tremor: Secondary | ICD-10-CM | POA: Diagnosis not present

## 2012-12-21 DIAGNOSIS — E785 Hyperlipidemia, unspecified: Secondary | ICD-10-CM

## 2012-12-21 DIAGNOSIS — E119 Type 2 diabetes mellitus without complications: Secondary | ICD-10-CM | POA: Diagnosis not present

## 2012-12-21 NOTE — Progress Notes (Signed)
Been ill thinks and Subjective:    Patient ID: Laura Farrell, female    DOB: 11/01/1945, 67 y.o.   MRN: [id number]  HPI Continued weight loss by her efforts .  She feels much better She has made diet changes by reducing carbs.  She does not monitor blood sugars. No symptoms of hyperglycemia Takes metformin twice daily on light to reduce this. Last A1c 6.2% in January  Previous left shoulder pains have resolved following physical therapy.   Patient has essential tremor. Saw a neurologist. Inderal tapered off and now taking Mysoline She still having some occasional tremors. He is to discuss with neurology.  Hyperlipidemia treated with simvastatin. No myalgias. Recent lipids reveal elevated triglycerides but stable LDL. Patient requesting repeat lipids at followup  Past Medical History  Diagnosis Date  . Hot flashes   . Over weight   . HLP (hyperkeratosis lenticularis perstans)   . Diabetes mellitus   . Headache   . Hyperlipidemia   . Thyroid disease   . Urine incontinence    Past Surgical History  Procedure Laterality Date  . Abdominal hysterectomy    . Left shoulder    . Lower back surgery    . Cornea implant    . Tubal ligation  1980  . Vesicovaginal fistula closure w/ tah  1995  . Spine surgery  2005  . Appendectomy      reports that she has never smoked. She has never used smokeless tobacco. She reports that she does not drink alcohol or use illicit drugs. family history includes Cancer in her father; Pancreatic cancer in her mother; and Tremor in her brother, father, mother, and sister. No Known Allergies     Review of Systems  Constitutional: Negative for fatigue.  Eyes: Negative for visual disturbance.  Respiratory: Negative for cough, chest tightness, shortness of breath and wheezing.   Cardiovascular: Negative for chest pain, palpitations and leg swelling.  Neurological: Negative for dizziness, seizures, syncope, weakness, light-headedness and headaches.        Objective:   Physical Exam  Constitutional: She appears well-developed and well-nourished. No distress.  Neck: Neck supple. No thyromegaly present.  Cardiovascular: Normal rate and regular rhythm.   Pulmonary/Chest: Effort normal and breath sounds normal. No respiratory distress. She has no wheezes. She has no rales.  Musculoskeletal: She exhibits no edema.  Lymphadenopathy:    She has no cervical adenopathy.          Assessment & Plan:  #1 type 2 diabetes. History of excellent control. We discussed tapering back metformin to once daily. Reassess A1c at followup in a couple months and if stable at that point may be able to discontinue metformin with her weight loss efforts November  #2 hyperlipidemia. Check lipids at followup. Suspect triglycerides will be improving with her weight loss efforts  #3 essential tremor. Continue followup with neurology

## 2012-12-21 NOTE — Patient Instructions (Addendum)
Reduce metformin to once daily.

## 2012-12-27 ENCOUNTER — Encounter: Payer: Medicare Other | Admitting: Physical Therapy

## 2012-12-29 ENCOUNTER — Ambulatory Visit: Payer: Medicare Other | Admitting: Family Medicine

## 2013-01-02 ENCOUNTER — Telehealth: Payer: Self-pay

## 2013-01-02 NOTE — Telephone Encounter (Signed)
She can try that OR she can go up to 100 mg primidone in the AM and 50 mg (which she is already on) at night.  Just document so that I know which she does.

## 2013-01-02 NOTE — Telephone Encounter (Signed)
For the past two weeks has been really shaky.  She is not sure if she should go back on a low dose of the propranolol or what.

## 2013-01-02 NOTE — Telephone Encounter (Signed)
Called and spoke with the patient. Information given as per Dr. Arbutus Leas below. The patient reports that she has been taking the Primidone 100 mg at HS vs 50 mg BID. She will try the recommendation as per Dr. Arbutus Leas re: 100 mg in the am and 50 mg at hs. I asked that she call me with an update on Monday or sooner if she was no better. She states she will. **Dr. Arbutus Leas, just a FYI....Marland KitchenMarland Kitchen

## 2013-01-08 NOTE — Telephone Encounter (Signed)
Patient called and spoke with Laura Farrell and she is doing well on the dose change and a refill was sent to her pharmacy. Advised to call if concerns or problems. She reports that she will.

## 2013-01-12 DIAGNOSIS — L570 Actinic keratosis: Secondary | ICD-10-CM | POA: Diagnosis not present

## 2013-01-29 ENCOUNTER — Encounter: Payer: Self-pay | Admitting: Neurology

## 2013-01-31 ENCOUNTER — Ambulatory Visit (INDEPENDENT_AMBULATORY_CARE_PROVIDER_SITE_OTHER): Payer: Medicare Other | Admitting: Neurology

## 2013-01-31 ENCOUNTER — Encounter: Payer: Self-pay | Admitting: Neurology

## 2013-01-31 VITALS — BP 126/74 | HR 70 | Temp 97.8°F | Resp 12 | Ht 64.0 in | Wt 158.0 lb

## 2013-01-31 DIAGNOSIS — G252 Other specified forms of tremor: Secondary | ICD-10-CM

## 2013-01-31 DIAGNOSIS — G250 Essential tremor: Secondary | ICD-10-CM | POA: Diagnosis not present

## 2013-01-31 NOTE — Patient Instructions (Addendum)
1.  Increase your primidone to 2 tablets in the AM and 1 in the PM 2.  If no better in 2 weeks, call me and we will restart propranolol at low dose 3.  Follow up in 3 months 4.  GREAT job on weight loss

## 2013-01-31 NOTE — Progress Notes (Signed)
Subjective:    I had the pleasure of seeing the patient back in movement d/o clinic for f/u on ET.  The patient reports that she has had tremor for about 20 years.  It involves both hands, but the L seems worse than the R even though she is R handed.    She is on primidone 50 mg, 2 tablets at night.  I weaned her down on propranolol last visit as we were not sure it was helping.  She was taking 60 mg bid and is now on 20 mg twice a day.  She is noted no difference with the decreased dosage of propranolol.  Tremor has been about the same.  Overall, she is pleased with her current degree of tremor control.   The patient has been diagnosed with mild sleep apnea.  She promised the dr that she would lose weight and she has continued to do that through diet changes.  She has lost a few pounds since last visit.    01/31/2013 Since our last visit, the patient called and we increased her primidone to 100 mg in the morning and 50 mg at night.  However, for some reason there must have been a misunderstanding and he did not do this.  She ended up just taking primidone, 50 mg twice a day and previously she had been taking 100 mg at night.  She notices that she is having difficulty putting her contacts in because of tremor.  She does state that she was doing fairly good with a combination of primidone and very low dose propranolol, but when the propranolol was completely discontinued, she noted more tremor.   Current/Previously tried tremor medications: primidone/propranolol  Current medications that may exacerbate tremor:  none    No Known Allergies  Current Outpatient Prescriptions on File Prior to Visit  Medication Sig Dispense Refill  . aspirin 81 MG tablet Take 81 mg by mouth daily.      Marland Kitchen gabapentin (NEURONTIN) 600 MG tablet Take 600 mg by mouth. One twice a day for hot flashes.      . levothyroxine (SYNTHROID, LEVOTHROID) 75 MCG tablet Take 1 tablet (75 mcg total) by mouth daily.  90 tablet  3  .  metFORMIN (GLUCOPHAGE) 500 MG tablet Take 500 mg by mouth daily with breakfast.      . metroNIDAZOLE (METROCREAM) 0.75 % cream Apply 1 application topically daily as needed.      . minocycline (MINOCIN,DYNACIN) 50 MG capsule Take 50 mg by mouth as needed. Per dermatologist as needed when face breaks out      . omeprazole (PRILOSEC) 40 MG capsule Take 40 mg by mouth daily.      . prednisoLONE acetate (PRED FORTE) 1 % ophthalmic suspension Place 1 drop into the left eye. Once daily      . primidone (MYSOLINE) 50 MG tablet 2 tabs at bedtime      . simvastatin (ZOCOR) 40 MG tablet Take 40 mg by mouth every evening.      . venlafaxine (EFFEXOR) 25 MG tablet Take 1 tablet (25 mg total) by mouth 2 (two) times daily.  180 tablet  2   No current facility-administered medications on file prior to visit.    Past Medical History  Diagnosis Date  . Hot flashes   . Over weight   . HLP (hyperkeratosis lenticularis perstans)   . Diabetes mellitus   . Headache   . Hyperlipidemia   . Thyroid disease   . Urine incontinence  Past Surgical History  Procedure Laterality Date  . Abdominal hysterectomy    . Left shoulder    . Lower back surgery    . Cornea implant    . Tubal ligation  1980  . Vesicovaginal fistula closure w/ tah  1995  . Spine surgery  2005  . Appendectomy      History   Social History  . Marital Status: Married    Spouse Name: N/A    Number of Children: N/A  . Years of Education: N/A   Occupational History  . Retired Press photographer    Social History Main Topics  . Smoking status: Never Smoker   . Smokeless tobacco: Never Used  . Alcohol Use: No  . Drug Use: No  . Sexually Active: Not on file   Other Topics Concern  . Not on file   Social History Narrative  . No narrative on file    Family Status  Relation Status Death Age  . Mother Deceased     pancreatic CA  . Father Deceased     lung CA (asbestos exposure)  . Sister Alive     2, breast CA  . Brother  Alive     healthy    Review of Systems She continues to try and lose weight through diet changes.  A complete 10 system ROS was obtained and was negative apart from what is mentioned.   Objective:   VITALS:   Filed Vitals:   01/31/13 0804  BP: 126/74  Pulse: 70  Temp: 97.8 F (36.6 C)  Resp: 12  Height: 5\' 4"  (1.626 m)  Weight: 158 lb (71.668 kg)   Wt Readings from Last 3 Encounters:  01/31/13 158 lb (71.668 kg)  12/21/12 161 lb (73.029 kg)  11/16/12 160 lb (72.576 kg)     Gen:  Appears stated age and in NAD. HEENT:  Normocephalic, atraumatic. The mucous membranes are moist. The superficial temporal arteries are without ropiness or tenderness. Cardiovascular: Regular rate and rhythm. Lungs: Clear to auscultation bilaterally. Neck: There are no carotid bruits noted bilaterally.  NEUROLOGICAL:  Orientation:  The patient is alert and oriented x 3.  Recent and remote memory are intact.  Attention span and concentration are normal.  Able to name objects and repeat without trouble.  Fund of knowledge is appropriate Cranial nerves: There is good facial symmetry. The pupils are equal round and reactive to light bilaterally. Fundoscopic exam reveals clear disc margins bilaterally. Extraocular muscles are intact and visual fields are full to confrontational testing. Speech is fluent and clear. Soft palate rises symmetrically and there is no tongue deviation. Hearing is intact to conversational tone. Tone: Tone is good throughout. Sensation: Sensation is intact to light touch throughout. Coordination:  The patient has no dysdiadichokinesia or dysmetria. Motor: Strength is 5/5 in the bilateral upper and lower extremities.  Shoulder shrug is equal bilaterally.  There is no pronator drift.  There are no fasciculations noted. Gait and Station: The patient is able to ambulate without difficulty.   MOVEMENT EXAM: Tremor:  There is virtually no tremor in the UE.   There is no tremor at  rest.  The patient is  able to pour water from one glass to another without spilling it.  She has only slight difficulty with Archimedes spirals.  LABS:  Lab Results  Component Value Date   WBC 4.9 08/23/2012   HGB 12.9 08/23/2012   HCT 39.4 08/23/2012   MCV 90.6 08/23/2012   PLT 150.0 08/23/2012  Chemistry      Component Value Date/Time   NA 140 11/13/2012 0856   K 4.4 11/13/2012 0856   CL 109 11/13/2012 0856   CO2 23 11/13/2012 0856   BUN 21 11/13/2012 0856   CREATININE 0.9 11/13/2012 0856      Component Value Date/Time   CALCIUM 9.6 11/13/2012 0856   ALKPHOS 67 11/13/2012 0856   AST 18 11/13/2012 0856   ALT 22 11/13/2012 0856   BILITOT 0.5 11/13/2012 0856     Lab Results  Component Value Date   TSH 0.53 10/17/2012   Lab Results  Component Value Date   VITAMINB12 >1500* 08/23/2012   Lab Results  Component Value Date   HGBA1C 6.2 10/17/2012       Assessment:    Essential tremor. I agree with the diagnosis.  She has had a long-standing history of this which has gradually progressed and has a strong family history of essential tremor.   Plan:   1.  I am going to continue her with the primidone, but I asked her to increase to 100 mg in the morning and 50 mg at night. Risks, benefits, side effects and alternative therapies were discussed.  The opportunity to ask questions was given and they were answered to the best of my ability.  The patient expressed understanding and willingness to follow the outlined treatment protocols. 2.  the patient is to call me in 2 weeks.  If she does not notice any significant difference, then we will add low dose propranolol back into her regimen. 3.  I gave her a prescription for a 0.5 pound weighted glove that she can use when she puts in her contacts or on her makeup.  She has noticed as long as she has a weight in her hand, then tremor is less. 4.  We discussed the diagnosis as well as pathophysiology of the disease.  We discussed treatment options as  well as prognostic indicators.  Patient education was provided. 5.  Return in about 3 months (around 05/02/2013).

## 2013-02-09 ENCOUNTER — Ambulatory Visit (INDEPENDENT_AMBULATORY_CARE_PROVIDER_SITE_OTHER): Payer: Medicare Other | Admitting: Family Medicine

## 2013-02-09 ENCOUNTER — Encounter: Payer: Self-pay | Admitting: Family Medicine

## 2013-02-09 VITALS — BP 110/72 | Temp 98.6°F | Wt 156.0 lb

## 2013-02-09 DIAGNOSIS — M549 Dorsalgia, unspecified: Secondary | ICD-10-CM

## 2013-02-09 DIAGNOSIS — L570 Actinic keratosis: Secondary | ICD-10-CM | POA: Diagnosis not present

## 2013-02-09 MED ORDER — CYCLOBENZAPRINE HCL 5 MG PO TABS: 5.0000 mg | ORAL_TABLET | Freq: Three times a day (TID) | ORAL | Status: DC | PRN

## 2013-02-09 MED ORDER — TRAMADOL HCL 50 MG PO TABS: 50.0000 mg | ORAL_TABLET | Freq: Three times a day (TID) | ORAL | Status: DC | PRN

## 2013-02-09 NOTE — Progress Notes (Addendum)
Chief Complaint  Patient presents with  . severe back pain    per pt bent over on Wednesday and could not straighten back up     HPI:  Acute visit for back pain: -started Wednesday -was bending over and straightening up and injured back -pain is in L lower back and radiates into L buttock, sharp, constant, mod-severe -pain is worse with bending over -better with rest -has tried heat and alleve, biofreeze  -she reports had back surgery 8-10 years ago with Dr[facility]on [facility] - she has called his office -Denies: fevers, chills, numbness, weakness, bowel or bladder incontinence   ROS: See pertinent positives and negatives per HPI.  Past Medical History  Diagnosis Date  . Hot flashes   . Over weight   . HLP (hyperkeratosis lenticularis perstans)   . Diabetes mellitus   . Headache   . Hyperlipidemia   . Thyroid disease   . Urine incontinence     Family History  Problem Relation Age of Onset  . Tremor Mother   . Pancreatic cancer Mother   . Tremor Father   . Cancer Father     lung  . Tremor Sister   . Tremor Brother     History   Social History  . Marital Status: Married    Spouse Name: N/A    Number of Children: N/A  . Years of Education: N/A   Occupational History  . Retired Press photographer    Social History Main Topics  . Smoking status: Never Smoker   . Smokeless tobacco: Never Used  . Alcohol Use: No  . Drug Use: No  . Sexually Active: None   Other Topics Concern  . None   Social History Narrative  . None    Current outpatient prescriptions:aspirin 81 MG tablet, Take 81 mg by mouth daily., Disp: , Rfl: ;  gabapentin (NEURONTIN) 600 MG tablet, Take 600 mg by mouth. One twice a day for hot flashes., Disp: , Rfl: ;  levothyroxine (SYNTHROID, LEVOTHROID) 75 MCG tablet, Take 1 tablet (75 mcg total) by mouth daily., Disp: 90 tablet, Rfl: 3;  metFORMIN (GLUCOPHAGE) 500 MG tablet, Take 500 mg by mouth daily with breakfast., Disp: , Rfl:  omeprazole  (PRILOSEC) 40 MG capsule, Take 40 mg by mouth daily., Disp: , Rfl: ;  prednisoLONE acetate (PRED FORTE) 1 % ophthalmic suspension, Place 1 drop into the left eye. Once daily, Disp: , Rfl: ;  primidone (MYSOLINE) 50 MG tablet, 2 tabs in the morning and one at bedtime, Disp: , Rfl: ;  simvastatin (ZOCOR) 40 MG tablet, Take 40 mg by mouth every evening., Disp: , Rfl:  venlafaxine (EFFEXOR) 25 MG tablet, Take 1 tablet (25 mg total) by mouth 2 (two) times daily., Disp: 180 tablet, Rfl: 2;  cyclobenzaprine (FLEXERIL) 5 MG tablet, Take 1 tablet (5 mg total) by mouth 3 (three) times daily as needed for muscle spasms., Disp: 30 tablet, Rfl: 1;  metroNIDAZOLE (METROCREAM) 0.75 % cream, Apply 1 application topically daily as needed., Disp: , Rfl:  minocycline (MINOCIN,DYNACIN) 50 MG capsule, Take 50 mg by mouth as needed. Per dermatologist as needed when face breaks out, Disp: , Rfl: ;  traMADol (ULTRAM) 50 MG tablet, Take 1 tablet (50 mg total) by mouth every 8 (eight) hours as needed for pain., Disp: 30 tablet, Rfl: 0  EXAM:  Filed Vitals:   02/09/13 1021  BP: 110/72  Temp: 98.6 F (37 C)    Body mass index is 26.76  kg/(m^2).  GENERAL: vitals reviewed and listed above, alert, oriented, appears well hydrated and in no acute distress  HEENT: atraumatic, conjunttiva clear, no obvious abnormalities on inspection of external nose and ears  NECK: no obvious masses on inspection  LUNGS: clear to auscultation bilaterally, no wheezes, rales or rhonchi, good air movement  CV: HRRR, no peripheral edema  MS: moves all extremities without noticeable abnormality Normal Gait Normal inspection of back, no obvious scoliosis or leg length descrepancy No bony TTP Soft tissue TTP at: lumbar paraspinal muscles L and Glutes L -/+ tests: neg trendelenburg,+facet loading on L, -SLRT, -CLRT, -FABER, -FADIR Normal muscle strength, sensation to light touch and DTRs in LEs bilaterally  PSYCH: pleasant and cooperative,  no obvious depression or anxiety  ASSESSMENT AND PLAN:  Discussed the following assessment and plan:  Back pain - Plan: cyclobenzaprine (FLEXERIL) 5 MG tablet, traMADol (ULTRAM) 50 MG tablet  -no neuro deficits on exam, query muscle strain from exam findings but with hx and she is adamant about seeing Dr. Venetia Maxon I had staff called Dr. Fredrich Birks office and they will contact patient to schedule appointment -advised flexeril, ultram, heat and gentle activity in the meantime - return and ED precautions -Patient advised to return or notify a doctor immediately if symptoms worsen or persist or new concerns arise.  There are no Patient Instructions on file for this visit.   Laura Basque R.

## 2013-02-12 DIAGNOSIS — M545 Low back pain, unspecified: Secondary | ICD-10-CM | POA: Diagnosis not present

## 2013-02-12 DIAGNOSIS — IMO0002 Reserved for concepts with insufficient information to code with codable children: Secondary | ICD-10-CM | POA: Diagnosis not present

## 2013-02-12 DIAGNOSIS — M4310 Spondylolisthesis, site unspecified: Secondary | ICD-10-CM | POA: Diagnosis not present

## 2013-02-12 DIAGNOSIS — M47817 Spondylosis without myelopathy or radiculopathy, lumbosacral region: Secondary | ICD-10-CM | POA: Diagnosis not present

## 2013-02-13 ENCOUNTER — Telehealth: Payer: Self-pay | Admitting: Family Medicine

## 2013-02-13 ENCOUNTER — Ambulatory Visit: Payer: Medicare Other | Admitting: Family Medicine

## 2013-02-13 NOTE — Telephone Encounter (Signed)
Pt says she had a message from Kriste Basque on her voice mail yesterday . Pt wanted me to send a message.

## 2013-02-13 NOTE — Telephone Encounter (Signed)
Per Dr. Selena Batten she did not try to contact patient.  Advised Dr. Selena Batten of pt's upcoming procedure. Pt states she did see Dr. Venetia Maxon and pt was worked on 02/12/13.  An MRI has been requested.

## 2013-02-15 ENCOUNTER — Other Ambulatory Visit: Payer: Medicare Other

## 2013-02-15 ENCOUNTER — Other Ambulatory Visit: Payer: Self-pay | Admitting: Neurosurgery

## 2013-02-15 DIAGNOSIS — M545 Low back pain, unspecified: Secondary | ICD-10-CM

## 2013-02-16 ENCOUNTER — Ambulatory Visit
Admission: RE | Admit: 2013-02-16 | Discharge: 2013-02-16 | Disposition: A | Discharge status: Home / Self Care | Payer: Medicare Other | Source: Ambulatory Visit | Attending: Neurosurgery | Admitting: Neurosurgery

## 2013-02-16 ENCOUNTER — Other Ambulatory Visit: Payer: Self-pay | Admitting: *Deleted

## 2013-02-16 DIAGNOSIS — M545 Low back pain, unspecified: Secondary | ICD-10-CM

## 2013-02-16 DIAGNOSIS — M549 Dorsalgia, unspecified: Secondary | ICD-10-CM

## 2013-02-16 DIAGNOSIS — M5126 Other intervertebral disc displacement, lumbar region: Secondary | ICD-10-CM | POA: Diagnosis not present

## 2013-02-16 MED ORDER — TRAMADOL HCL 50 MG PO TABS: 50.0000 mg | ORAL_TABLET | Freq: Three times a day (TID) | ORAL | Status: DC | PRN

## 2013-02-16 MED ORDER — GADOBENATE DIMEGLUMINE 529 MG/ML IV SOLN
14.0000 mL | Freq: Once | INTRAVENOUS | Status: AC | PRN
  Administered 2013-02-16: 14 mL via INTRAVENOUS

## 2013-02-26 ENCOUNTER — Other Ambulatory Visit: Payer: Self-pay

## 2013-02-26 ENCOUNTER — Other Ambulatory Visit: Payer: Self-pay | Admitting: *Deleted

## 2013-02-26 MED ORDER — PRIMIDONE 50 MG PO TABS: ORAL_TABLET | ORAL | Status: DC

## 2013-02-26 MED ORDER — GABAPENTIN 600 MG PO TABS: 600.0000 mg | ORAL_TABLET | Freq: Two times a day (BID) | ORAL | Status: DC

## 2013-02-28 DIAGNOSIS — M5126 Other intervertebral disc displacement, lumbar region: Secondary | ICD-10-CM | POA: Diagnosis not present

## 2013-02-28 DIAGNOSIS — IMO0002 Reserved for concepts with insufficient information to code with codable children: Secondary | ICD-10-CM | POA: Diagnosis not present

## 2013-02-28 DIAGNOSIS — M545 Low back pain, unspecified: Secondary | ICD-10-CM | POA: Diagnosis not present

## 2013-02-28 DIAGNOSIS — M47817 Spondylosis without myelopathy or radiculopathy, lumbosacral region: Secondary | ICD-10-CM | POA: Diagnosis not present

## 2013-03-02 ENCOUNTER — Ambulatory Visit: Payer: Medicare Other | Admitting: Family Medicine

## 2013-03-09 DIAGNOSIS — M48061 Spinal stenosis, lumbar region without neurogenic claudication: Secondary | ICD-10-CM | POA: Diagnosis not present

## 2013-03-09 DIAGNOSIS — IMO0002 Reserved for concepts with insufficient information to code with codable children: Secondary | ICD-10-CM | POA: Diagnosis not present

## 2013-03-14 ENCOUNTER — Other Ambulatory Visit: Payer: Medicare Other

## 2013-03-14 ENCOUNTER — Telehealth: Payer: Self-pay | Admitting: *Deleted

## 2013-03-14 NOTE — Telephone Encounter (Signed)
Faxed medical records for 08/14/12 to [facility] [phone]. Had received Authorization to release records from patient

## 2013-03-19 ENCOUNTER — Other Ambulatory Visit (INDEPENDENT_AMBULATORY_CARE_PROVIDER_SITE_OTHER): Payer: Medicare Other

## 2013-03-19 DIAGNOSIS — E039 Hypothyroidism, unspecified: Secondary | ICD-10-CM | POA: Diagnosis not present

## 2013-03-19 DIAGNOSIS — E119 Type 2 diabetes mellitus without complications: Secondary | ICD-10-CM | POA: Diagnosis not present

## 2013-03-19 DIAGNOSIS — E785 Hyperlipidemia, unspecified: Secondary | ICD-10-CM

## 2013-03-19 LAB — HEMOGLOBIN A1C: Hgb A1c MFr Bld: 5.8 % (ref 4.6–6.5)

## 2013-03-19 LAB — LIPID PANEL
HDL: 45 mg/dL (ref >39.00)
LDL Cholesterol: 98 mg/dL (ref 0–99)
Total CHOL/HDL Ratio: 4
VLDL: 28 mg/dL (ref 0.0–40.0)

## 2013-03-21 ENCOUNTER — Ambulatory Visit (INDEPENDENT_AMBULATORY_CARE_PROVIDER_SITE_OTHER): Payer: Medicare Other | Admitting: Family Medicine

## 2013-03-21 ENCOUNTER — Encounter: Payer: Self-pay | Admitting: Family Medicine

## 2013-03-21 VITALS — BP 110/70 | Temp 98.4°F | Wt 149.0 lb

## 2013-03-21 DIAGNOSIS — E119 Type 2 diabetes mellitus without complications: Secondary | ICD-10-CM | POA: Diagnosis not present

## 2013-03-21 DIAGNOSIS — E785 Hyperlipidemia, unspecified: Secondary | ICD-10-CM

## 2013-03-21 DIAGNOSIS — E039 Hypothyroidism, unspecified: Secondary | ICD-10-CM | POA: Diagnosis not present

## 2013-03-21 MED ORDER — SIMVASTATIN 40 MG PO TABS: 40.0000 mg | ORAL_TABLET | Freq: Every evening | ORAL | Status: DC

## 2013-03-21 NOTE — Progress Notes (Signed)
  Subjective:    Patient ID: Laura Farrell, female    DOB: [birth year], 67 y.o.   MRN: [id number]  HPI Patient for followup regarding medical problems. Excellent job of weight loss. She's lost about 30 pounds this year. Patient has type 2 diabetes, hyperlipidemia, essential tremor, GERD, hypothyroidism. Recent labs reviewed. A1c 5.8%. Lipids are stable. TSH normal range Blood pressure very well controlled.  She's getting ready to start exercise. She's lost her weight mostly to reduce in sugars and starches Overall feels great. She still has good appetite but has been very disciplined with her dietary changes. We reduced her metformin last visit and A1c has reduced even with reducing her metformin July to consider reducing her simvastatin dosage at this time.  Past Medical History  Diagnosis Date  . Hot flashes   . Over weight   . HLP (hyperkeratosis lenticularis perstans)   . Diabetes mellitus   . Headache(784.0)   . Hyperlipidemia   . Thyroid disease   . Urine incontinence    Past Surgical History  Procedure Laterality Date  . Abdominal hysterectomy    . Left shoulder    . Lower back surgery    . Cornea implant    . Tubal ligation  1980  . Vesicovaginal fistula closure w/ tah  1995  . Spine surgery  2005  . Appendectomy      reports that she has never smoked. She has never used smokeless tobacco. She reports that she does not drink alcohol or use illicit drugs. family history includes Cancer in her father; Pancreatic cancer in her mother; and Tremor in her brother, father, mother, and sister. No Known Allergies    Review of Systems  Constitutional: Negative for fatigue and unexpected weight change.  Eyes: Negative for visual disturbance.  Respiratory: Negative for cough, chest tightness, shortness of breath and wheezing.   Cardiovascular: Negative for chest pain, palpitations and leg swelling.  Neurological: Negative for dizziness, seizures, syncope, weakness,  light-headedness and headaches.       Objective:   Physical Exam  Constitutional: She appears well-developed and well-nourished.  Neck: Neck supple. No thyromegaly present.  Cardiovascular: Normal rate and regular rhythm.   Pulmonary/Chest: Effort normal and breath sounds normal. No respiratory distress. She has no wheezes. She has no rales.  Musculoskeletal: She exhibits no edema.  Lymphadenopathy:    She has no cervical adenopathy.          Assessment & Plan:  #1 type 2 diabetes. Well controlled. Continue low-dose metformin. Recheck A1c 4 months. If stable at that time consider discontinuing metformin #2 hyperlipidemia. Reduce simvastatin 20 mg daily. Recheck lipids in 4 months #3 hypothyroidism. At goal. Continue current dosage levothyroxine #4 essential tremor currently on Mysoline. Followed by neurology

## 2013-03-25 DIAGNOSIS — B029 Zoster without complications: Secondary | ICD-10-CM | POA: Diagnosis not present

## 2013-03-26 ENCOUNTER — Other Ambulatory Visit: Payer: Self-pay | Admitting: *Deleted

## 2013-03-26 MED ORDER — LEVOTHYROXINE SODIUM 75 MCG PO TABS: 75.0000 ug | ORAL_TABLET | Freq: Every day | ORAL | Status: DC

## 2013-03-29 ENCOUNTER — Encounter: Payer: Medicare Other | Admitting: Family Medicine

## 2013-03-29 NOTE — Progress Notes (Signed)
Late cancel  This encounter was created in error - please disregard. 

## 2013-04-02 ENCOUNTER — Other Ambulatory Visit: Payer: Self-pay | Admitting: *Deleted

## 2013-04-02 NOTE — Telephone Encounter (Signed)
Fax request for Gabapentin 600 mg, [1] BID Last Rx: 05.19.14, #[id number] Verified with pharmacy that pt received #180; too soon for refill request/SLS

## 2013-04-03 DIAGNOSIS — IMO0002 Reserved for concepts with insufficient information to code with codable children: Secondary | ICD-10-CM | POA: Diagnosis not present

## 2013-04-03 DIAGNOSIS — M5126 Other intervertebral disc displacement, lumbar region: Secondary | ICD-10-CM | POA: Diagnosis not present

## 2013-04-03 HISTORY — DX: Other intervertebral disc displacement, lumbar region: M51.26

## 2013-04-25 DIAGNOSIS — H2510 Age-related nuclear cataract, unspecified eye: Secondary | ICD-10-CM | POA: Diagnosis not present

## 2013-04-25 DIAGNOSIS — Z5189 Encounter for other specified aftercare: Secondary | ICD-10-CM | POA: Diagnosis not present

## 2013-05-01 DIAGNOSIS — M5126 Other intervertebral disc displacement, lumbar region: Secondary | ICD-10-CM | POA: Diagnosis not present

## 2013-05-01 DIAGNOSIS — M47817 Spondylosis without myelopathy or radiculopathy, lumbosacral region: Secondary | ICD-10-CM | POA: Diagnosis not present

## 2013-05-01 DIAGNOSIS — IMO0002 Reserved for concepts with insufficient information to code with codable children: Secondary | ICD-10-CM | POA: Diagnosis not present

## 2013-05-01 DIAGNOSIS — M545 Low back pain, unspecified: Secondary | ICD-10-CM | POA: Diagnosis not present

## 2013-05-02 ENCOUNTER — Encounter: Payer: Self-pay | Admitting: Neurology

## 2013-05-02 ENCOUNTER — Ambulatory Visit (INDEPENDENT_AMBULATORY_CARE_PROVIDER_SITE_OTHER): Payer: Medicare Other | Admitting: Neurology

## 2013-05-02 VITALS — BP 128/80 | HR 84 | Temp 98.2°F | Resp 20 | Wt 148.0 lb

## 2013-05-02 DIAGNOSIS — G250 Essential tremor: Secondary | ICD-10-CM

## 2013-05-02 NOTE — Patient Instructions (Addendum)
Follow up the first of November.

## 2013-05-02 NOTE — Progress Notes (Signed)
Subjective:    I had the pleasure of seeing the patient back in movement d/o clinic for f/u on ET.  The patient reports that she has had tremor for about 20 years.  It involves both hands, but the L seems worse than the R even though she is R handed.    She is on primidone 50 mg, 2 tablets at night.  I weaned her down on propranolol last visit as we were not sure it was helping.  She was taking 60 mg bid and is now on 20 mg twice a day.  She is noted no difference with the decreased dosage of propranolol.  Tremor has been about the same.  Overall, she is pleased with her current degree of tremor control.   The patient has been diagnosed with mild sleep apnea.  She promised the dr that she would lose weight and she has continued to do that through diet changes.  She has lost a few pounds since last visit.    05/02/2013 She was supposed to be primidone,100 mg in the morning and 50 mg at night but she switched that to 50 mg in the morning and 100 mg at night because she was fatigued.   She does state that she was doing fairly good with a combination of primidone and very low dose propranolol, but when the propranolol was completely discontinued, she noted more tremor.   She has ben taking 1/2 of her husbands klonopin at night.  That makes her very sleepy.  She has lost a significant amount of weight.  She reports that last September she weighed 183 pounds and today she was 148 pounds.  She has been watching her diet closely, but has not been exercising.  Overall, she feels much better.  Current/Previously tried tremor medications: primidone/propranolol  Current medications that may exacerbate tremor:  none    No Known Allergies  Current Outpatient Prescriptions on File Prior to Visit  Medication Sig Dispense Refill  . aspirin 81 MG tablet Take 81 mg by mouth daily.      Marland Kitchen gabapentin (NEURONTIN) 600 MG tablet Take 1 tablet (600 mg total) by mouth 2 (two) times daily. One twice a day for hot flashes.   180 tablet  0  . levothyroxine (SYNTHROID, LEVOTHROID) 75 MCG tablet Take 1 tablet (75 mcg total) by mouth daily.  90 tablet  3  . metFORMIN (GLUCOPHAGE) 500 MG tablet Take 500 mg by mouth daily with breakfast.      . omeprazole (PRILOSEC) 40 MG capsule Take 40 mg by mouth daily.      . prednisoLONE acetate (PRED FORTE) 1 % ophthalmic suspension Place 1 drop into the left eye. Once daily      . primidone (MYSOLINE) 50 MG tablet 1 tabs in the morning and 2 at bedtime      . simvastatin (ZOCOR) 40 MG tablet Take 1 tablet (40 mg total) by mouth every evening.  30 tablet  6  . venlafaxine (EFFEXOR) 25 MG tablet Take 1 tablet (25 mg total) by mouth 2 (two) times daily.  180 tablet  2   No current facility-administered medications on file prior to visit.    Past Medical History  Diagnosis Date  . Hot flashes   . Over weight   . HLP (hyperkeratosis lenticularis perstans)   . Diabetes mellitus   . Headache(784.0)   . Hyperlipidemia   . Thyroid disease   . Urine incontinence     Past Surgical History  Procedure Laterality Date  . Abdominal hysterectomy    . Left shoulder    . Lower back surgery    . Cornea implant    . Tubal ligation  1980  . Vesicovaginal fistula closure w/ tah  1995  . Spine surgery  2005  . Appendectomy      History   Social History  . Marital Status: Married    Spouse Name: N/A    Number of Children: N/A  . Years of Education: N/A   Occupational History  . Retired Press photographer    Social History Main Topics  . Smoking status: Never Smoker   . Smokeless tobacco: Never Used  . Alcohol Use: No  . Drug Use: No  . Sexually Active: Not on file   Other Topics Concern  . Not on file   Social History Narrative  . No narrative on file    Family Status  Relation Status Death Age  . Mother Deceased     pancreatic CA  . Father Deceased     lung CA (asbestos exposure)  . Sister Alive     2, breast CA  . Brother Alive     healthy    Review of  Systems She continues to try and lose weight through diet changes.  She has lost about 30 lbs this year.  A complete 10 system ROS was obtained and was negative apart from what is mentioned.   Objective:   VITALS:   Filed Vitals:   05/02/13 0848  BP: 128/80  Pulse: 84  Temp: 98.2 F (36.8 C)  Resp: 20  Weight: 148 lb (67.132 kg)   Wt Readings from Last 3 Encounters:  05/02/13 148 lb (67.132 kg)  03/21/13 149 lb (67.586 kg)  02/09/13 156 lb (70.761 kg)     Gen:  Appears stated age and in NAD. HEENT:  Normocephalic, atraumatic. The mucous membranes are moist. The superficial temporal arteries are without ropiness or tenderness. Cardiovascular: Regular rate and rhythm. Lungs: Clear to auscultation bilaterally. Neck: There are no carotid bruits noted bilaterally.  NEUROLOGICAL:  Orientation:  The patient is alert and oriented x 3.  Recent and remote memory are intact.  Attention span and concentration are normal.  Able to name objects and repeat without trouble.  Fund of knowledge is appropriate Cranial nerves: There is good facial symmetry. The pupils are equal round and reactive to light bilaterally. Fundoscopic exam reveals clear disc margins bilaterally. Extraocular muscles are intact and visual fields are full to confrontational testing. Speech is fluent and clear. Soft palate rises symmetrically and there is no tongue deviation. Hearing is intact to conversational tone. Tone: Tone is good throughout. Sensation: Sensation is intact to light touch throughout. Coordination:  The patient has no dysdiadichokinesia or dysmetria. Motor: Strength is 5/5 in the bilateral upper and lower extremities.  Shoulder shrug is equal bilaterally.  There is no pronator drift.  There are no fasciculations noted. Gait and Station: The patient is able to ambulate without difficulty.   MOVEMENT EXAM: Tremor:  There is virtually no tremor in the UE.   There is no tremor at rest.  The patient is  able  to pour water from one glass to another without spilling it.  She has no significant difficulty with Archimedes spirals, and especially has difficulty when she goes to write.  She does have head titubation.  LABS:  Lab Results  Component Value Date   WBC 4.9 08/23/2012   HGB 12.9 08/23/2012  HCT 39.4 08/23/2012   MCV 90.6 08/23/2012   PLT 150.0 08/23/2012     Chemistry      Component Value Date/Time   NA 140 11/13/2012 0856   K 4.4 11/13/2012 0856   CL 109 11/13/2012 0856   CO2 23 11/13/2012 0856   BUN 21 11/13/2012 0856   CREATININE 0.9 11/13/2012 0856      Component Value Date/Time   CALCIUM 9.6 11/13/2012 0856   ALKPHOS 67 11/13/2012 0856   AST 18 11/13/2012 0856   ALT 22 11/13/2012 0856   BILITOT 0.5 11/13/2012 0856     Lab Results  Component Value Date   TSH 0.51 03/19/2013   Lab Results  Component Value Date   VITAMINB12 >1500* 08/23/2012   Lab Results  Component Value Date   HGBA1C 5.8 03/19/2013       Assessment:    Essential tremor. I agree with the diagnosis.  She has had a long-standing history of this which has gradually progressed and has a strong family history of essential tremor.   Plan:   1.  I am going to continue her with the primidone, 50 mg in the morning and 100 mg at night. Risks, benefits, side effects and alternative therapies were discussed.  The opportunity to ask questions was given and they were answered to the best of my ability.  The patient expressed understanding and willingness to follow the outlined treatment protocols. 2.  we will reinitiate propranolol.  It appears that she was doing fairly well when she was at 20 mg of propranolol twice a day in combination with the primidone.  When she came to me on propranolol alone, 60 mg twice a day, tremor was not well controlled.  I asked her to hold on taking her husband clonazepam for now.  If we need in the future, I will give her a prescription. 3.   Return in about 3 months (around 08/02/2013).

## 2013-05-18 ENCOUNTER — Telehealth: Payer: Self-pay

## 2013-05-18 MED ORDER — PROPRANOLOL HCL ER 60 MG PO CP24: 60.0000 mg | ORAL_CAPSULE | Freq: Every day | ORAL | Status: DC

## 2013-05-18 NOTE — Telephone Encounter (Signed)
Change it to inderal LA, 60 mg once daily.  Call  In RX for her please.

## 2013-05-18 NOTE — Telephone Encounter (Signed)
Pt aware and Rx sent in to [facility] pharmacy.

## 2013-05-18 NOTE — Telephone Encounter (Signed)
Pt calling she added propranolol 10mg  bid for 2 weeks, but has not noticed a decrease in her tremor.  What should she do next?

## 2013-05-22 DIAGNOSIS — D235 Other benign neoplasm of skin of trunk: Secondary | ICD-10-CM | POA: Diagnosis not present

## 2013-05-22 DIAGNOSIS — L299 Pruritus, unspecified: Secondary | ICD-10-CM | POA: Diagnosis not present

## 2013-05-22 DIAGNOSIS — L538 Other specified erythematous conditions: Secondary | ICD-10-CM | POA: Diagnosis not present

## 2013-05-25 ENCOUNTER — Telehealth: Payer: Self-pay

## 2013-05-25 NOTE — Telephone Encounter (Signed)
Inderal LA 60mg  not covered under pt's insurance.  Anything else we can try?

## 2013-05-27 NOTE — Telephone Encounter (Signed)
That doesn't make sense.  Its generic.  Can you call pharmacy and see if you can figure out the problem?

## 2013-05-28 ENCOUNTER — Other Ambulatory Visit: Payer: Self-pay

## 2013-05-28 MED ORDER — OMEPRAZOLE 20 MG PO CPDR: DELAYED_RELEASE_CAPSULE | ORAL | Status: DC

## 2013-05-28 MED ORDER — OMEPRAZOLE 40 MG PO CPDR: DELAYED_RELEASE_CAPSULE | ORAL | Status: DC

## 2013-05-28 MED ORDER — GABAPENTIN 600 MG PO TABS: 600.0000 mg | ORAL_TABLET | Freq: Two times a day (BID) | ORAL | Status: DC

## 2013-05-28 MED ORDER — ETODOLAC 400 MG PO TABS: 400.0000 mg | ORAL_TABLET | Freq: Two times a day (BID) | ORAL | Status: DC

## 2013-05-28 NOTE — Telephone Encounter (Signed)
What about taking the regular propranolol 30 mg bid then

## 2013-05-28 NOTE — Telephone Encounter (Signed)
It is technically covered, but at a $55/month co pay.  Too expensive for her.

## 2013-05-29 MED ORDER — PROPRANOLOL HCL 60 MG PO TABS: ORAL_TABLET | ORAL | Status: DC

## 2013-05-29 NOTE — Telephone Encounter (Signed)
Pt aware and med sent it.

## 2013-06-27 ENCOUNTER — Telehealth: Payer: Self-pay | Admitting: Family Medicine

## 2013-06-27 MED ORDER — OMEPRAZOLE 40 MG PO CPDR: 40.0000 mg | DELAYED_RELEASE_CAPSULE | Freq: Two times a day (BID) | ORAL | Status: DC

## 2013-06-27 NOTE — Telephone Encounter (Signed)
See notes below and please send in. Thank you.

## 2013-06-27 NOTE — Telephone Encounter (Signed)
I received a prior auth on this patient for Omeprazole DR 20mg , 2 capsules BID - four capsules per day. I don't think they will cover 4 capsules of 20mg  per day; they will be more likely to cover two capsules per day of the 40mg . Is that an option? If not, I will attempt the PA. Thank you.

## 2013-06-27 NOTE — Telephone Encounter (Signed)
OK to switch to 2 capsules daily

## 2013-06-27 NOTE — Telephone Encounter (Signed)
Rx sent in for prilosec 40mg  bid

## 2013-07-13 DIAGNOSIS — Z48298 Encounter for aftercare following other organ transplant: Secondary | ICD-10-CM | POA: Diagnosis not present

## 2013-07-13 DIAGNOSIS — Z947 Corneal transplant status: Secondary | ICD-10-CM | POA: Diagnosis not present

## 2013-07-17 ENCOUNTER — Other Ambulatory Visit (INDEPENDENT_AMBULATORY_CARE_PROVIDER_SITE_OTHER): Payer: Medicare Other

## 2013-07-17 DIAGNOSIS — E785 Hyperlipidemia, unspecified: Secondary | ICD-10-CM

## 2013-07-17 DIAGNOSIS — E119 Type 2 diabetes mellitus without complications: Secondary | ICD-10-CM | POA: Diagnosis not present

## 2013-07-17 LAB — LIPID PANEL
Cholesterol: 220 mg/dL — ABNORMAL HIGH (ref 0–200)
HDL: 47.1 mg/dL (ref >39.00)
VLDL: 60.6 mg/dL — ABNORMAL HIGH (ref 0.0–40.0)

## 2013-07-17 LAB — LDL CHOLESTEROL, DIRECT: Direct LDL: 132.4 mg/dL

## 2013-07-19 ENCOUNTER — Telehealth: Payer: Self-pay | Admitting: Family Medicine

## 2013-07-19 NOTE — Telephone Encounter (Signed)
Prior auth submitted to [facility] [facility]

## 2013-07-19 NOTE — Telephone Encounter (Signed)
Pt insurance does not want to pay for pt to have omeprazole (PRILOSEC) 40 MG capsule 2 x /day. thye will pay for once day.  Pharm will fax info.

## 2013-07-20 ENCOUNTER — Other Ambulatory Visit: Payer: Self-pay | Admitting: *Deleted

## 2013-07-20 ENCOUNTER — Telehealth: Payer: Self-pay

## 2013-07-20 DIAGNOSIS — Z1231 Encounter for screening mammogram for malignant neoplasm of breast: Secondary | ICD-10-CM

## 2013-07-20 MED ORDER — PRIMIDONE 50 MG PO TABS: ORAL_TABLET | ORAL | Status: DC

## 2013-07-20 MED ORDER — VENLAFAXINE HCL 25 MG PO TABS: 25.0000 mg | ORAL_TABLET | Freq: Two times a day (BID) | ORAL | Status: DC

## 2013-07-20 NOTE — Telephone Encounter (Signed)
Refill request for Primidone and Propranolol.

## 2013-07-20 NOTE — Telephone Encounter (Signed)
Called patient to confirm dose of medications.  Primidone 50mg  am and 100mg  qhs, but she reports taking propranolol 60mg  BID.  According to her last clinic visit, she should be on propranolol 20mg  BID. I have sent refill for Primidone, but she will call the office on Monday to speak with Dr. Arbutus Leas regarding her propranolol as she was planning on calling with clinical update, too.  Laura Minshall K. Allena Katz, DO

## 2013-07-23 ENCOUNTER — Telehealth: Payer: Self-pay | Admitting: Neurology

## 2013-07-24 ENCOUNTER — Ambulatory Visit: Payer: Medicare Other

## 2013-07-25 ENCOUNTER — Ambulatory Visit: Payer: Medicare Other | Admitting: Family Medicine

## 2013-07-27 ENCOUNTER — Telehealth: Payer: Self-pay

## 2013-07-27 NOTE — Telephone Encounter (Signed)
Left a message for pt to call back if she still has questions for [place]. I have called 3 times the past 4 days and have been unable to reach pt.

## 2013-07-30 ENCOUNTER — Other Ambulatory Visit: Payer: Self-pay

## 2013-07-30 MED ORDER — METFORMIN HCL 500 MG PO TABS: 500.0000 mg | ORAL_TABLET | Freq: Every day | ORAL | Status: DC

## 2013-08-03 ENCOUNTER — Ambulatory Visit: Payer: Medicare Other

## 2013-08-03 DIAGNOSIS — Z23 Encounter for immunization: Secondary | ICD-10-CM | POA: Diagnosis not present

## 2013-08-06 ENCOUNTER — Other Ambulatory Visit: Payer: Self-pay

## 2013-08-06 MED ORDER — GABAPENTIN 600 MG PO TABS: 600.0000 mg | ORAL_TABLET | Freq: Two times a day (BID) | ORAL | Status: DC

## 2013-08-14 ENCOUNTER — Telehealth: Payer: Self-pay | Admitting: Oncology

## 2013-08-14 NOTE — Telephone Encounter (Signed)
pt called to r/s appt to later date

## 2013-08-15 ENCOUNTER — Ambulatory Visit: Payer: Medicare Other | Admitting: Oncology

## 2013-08-15 ENCOUNTER — Ambulatory Visit: Payer: Medicare Other

## 2013-08-16 ENCOUNTER — Encounter: Payer: Self-pay | Admitting: Family Medicine

## 2013-08-16 ENCOUNTER — Ambulatory Visit (INDEPENDENT_AMBULATORY_CARE_PROVIDER_SITE_OTHER): Payer: Medicare Other | Admitting: Family Medicine

## 2013-08-16 VITALS — BP 124/60 | HR 62 | Temp 98.8°F | Wt 160.9 lb

## 2013-08-16 DIAGNOSIS — E119 Type 2 diabetes mellitus without complications: Secondary | ICD-10-CM

## 2013-08-16 DIAGNOSIS — E785 Hyperlipidemia, unspecified: Secondary | ICD-10-CM

## 2013-08-16 LAB — HM DIABETES FOOT EXAM: HM Diabetic Foot Exam: NORMAL

## 2013-08-16 NOTE — Progress Notes (Signed)
  Subjective:    Patient ID: [name], female    DOB: [birth year], 67 y.o.   MRN: [id number]  HPI Followup regarding her chronic medical problems She has unfortunately gained back about 11 pounds since last visit. She's been poorly compliant with diet. Diabetes remains well controlled. A1c 5.7%. She does not monitor sugars at home. No symptoms of hyperglycemia. Currently only takes metformin 500 mg once daily. She is getting regular eye exams.  Hyperlipidemia. Recent LDL over 100. She takes simvastatin 40 mg daily. Compliant with therapy. Previously took [employer]. She's not interested in additional medications time. Last-year-old her weight was down even further her lipids were improved No cardiac history. She has hypothyroidism and takes levothyroxin. Recent TSH normal.  Continues to have issues with essential tremor. She is currently treated with Mysoline and propranolol  Past Medical History  Diagnosis Date  . Hot flashes   . Over weight   . HLP (hyperkeratosis lenticularis perstans)   . Diabetes mellitus   . Headache(784.0)   . Hyperlipidemia   . Thyroid disease   . Urine incontinence    Past Surgical History  Procedure Laterality Date  . Abdominal hysterectomy    . Left shoulder    . Lower back surgery    . Cornea implant    . Tubal ligation  1980  . Vesicovaginal fistula closure w/ tah  1995  . Spine surgery  2005  . Appendectomy      reports that she has never smoked. She has never used smokeless tobacco. She reports that she does not drink alcohol or use illicit drugs. family history includes Cancer in her father; Pancreatic cancer in her mother; Tremor in her brother, father, mother, and sister. No Known Allergies    Review of Systems  Constitutional: Negative for fatigue and unexpected weight change.  Eyes: Negative for visual disturbance.  Respiratory: Negative for cough, chest tightness, shortness of breath and wheezing.   Cardiovascular: Negative for  chest pain, palpitations and leg swelling.  Neurological: Negative for dizziness, seizures, syncope, weakness, light-headedness and headaches.       Objective:   Physical Exam  Constitutional: She appears well-developed and well-nourished.  HENT:  Mouth/Throat: Oropharynx is clear and moist.  Neck: Neck supple. No thyromegaly present.  Cardiovascular: Normal rate and regular rhythm.   Pulmonary/Chest: Effort normal and breath sounds normal. No respiratory distress. She has no wheezes. She has no rales.  Musculoskeletal: She exhibits no edema.  Skin: No rash noted.          Assessment & Plan:  #1 type 2 diabetes. Excellent control. Continue weight control efforts. Discontinue metformin and repeat A1c a followup in 4 months #2 dyslipidemia. Lipids not to goal. We discussed other options such as possible addition of WelChol at this point she wishes to lose weight and repeat lipids in 4 months #3 essential tremor followed by neurology

## 2013-08-16 NOTE — Patient Instructions (Signed)
Stop Metformin and continue with weight control efforts.

## 2013-08-23 NOTE — Telephone Encounter (Signed)
Phone note completed ° °

## 2013-08-29 ENCOUNTER — Ambulatory Visit: Payer: Medicare Other | Admitting: Neurology

## 2013-08-30 ENCOUNTER — Encounter: Payer: Self-pay | Admitting: Neurology

## 2013-08-30 ENCOUNTER — Ambulatory Visit (INDEPENDENT_AMBULATORY_CARE_PROVIDER_SITE_OTHER): Payer: Medicare Other | Admitting: Neurology

## 2013-08-30 VITALS — BP 130/76 | HR 68 | Temp 98.1°F | Resp 12 | Ht 64.0 in | Wt 160.7 lb

## 2013-08-30 DIAGNOSIS — G250 Essential tremor: Secondary | ICD-10-CM

## 2013-08-30 MED ORDER — TOPIRAMATE 25 MG PO TABS: ORAL_TABLET | ORAL | Status: DC

## 2013-08-30 MED ORDER — TOPIRAMATE 100 MG PO TABS: 100.0000 mg | ORAL_TABLET | Freq: Every day | ORAL | Status: DC

## 2013-08-30 NOTE — Progress Notes (Signed)
Subjective:    I had the pleasure of seeing the patient back in movement d/o clinic for f/u on ET.  The patient reports that she has had tremor for about 20 years.  It involves both hands, but the L seems worse than the R even though she is R handed.    She is on primidone 50 mg, 2 tablets at night.  I weaned her down on propranolol last visit as we were not sure it was helping.  She was taking 60 mg bid and is now on 20 mg twice a day.  She is noted no difference with the decreased dosage of propranolol.  Tremor has been about the same.  Overall, she is pleased with her current degree of tremor control.   The patient has been diagnosed with mild sleep apnea.  She promised the dr that she would lose weight and she has continued to do that through diet changes.  She has lost a few pounds since last visit.    05/02/13  She was supposed to be primidone,100 mg in the morning and 50 mg at night but she switched that to 50 mg in the morning and 100 mg at night because she was fatigued.   She does state that she was doing fairly good with a combination of primidone and very low dose propranolol, but when the propranolol was completely discontinued, she noted more tremor.   She has ben taking 1/2 of her husbands klonopin at night.  That makes her very sleepy.  She has lost a significant amount of weight.  She reports that last September she weighed 183 pounds and today she was 148 pounds.  She has been watching her diet closely, but has not been exercising.  Overall, she feels much better.  08/29/13 update:  The pt is on primidone 50 mg in the AM, 100 mg at night.  We had restarted propranolol and increased it over the phone because when we previously d/c it she noted more tremor.  She got back up to 60 mg bid and noted no difference in tremor and she d/c it.  She thinks that tremor is getting worse.  She notes difficulty holding food such as cornbread.  Her balance has been good.  Current/Previously tried  tremor medications: primidone/propranolol  Current medications that may exacerbate tremor:  none    No Known Allergies  Current Outpatient Prescriptions on File Prior to Visit  Medication Sig Dispense Refill  . aspirin 81 MG tablet Take 81 mg by mouth daily.      Marland Kitchen etodolac (LODINE) 400 MG tablet Take 1 tablet (400 mg total) by mouth 2 (two) times daily.  60 tablet  3  . gabapentin (NEURONTIN) 600 MG tablet Take 1 tablet (600 mg total) by mouth 2 (two) times daily. One twice a day for hot flashes.  180 tablet  1  . levothyroxine (SYNTHROID, LEVOTHROID) 75 MCG tablet Take 1 tablet (75 mcg total) by mouth daily.  90 tablet  3  . omeprazole (PRILOSEC) 40 MG capsule Take 1 capsule (40 mg total) by mouth 2 (two) times daily.  180 capsule  1  . prednisoLONE acetate (PRED FORTE) 1 % ophthalmic suspension Place 1 drop into the left eye. Once daily      . primidone (MYSOLINE) 50 MG tablet 1 tabs in the morning and 2 at bedtime  90 tablet  3  . simvastatin (ZOCOR) 40 MG tablet Take 1 tablet (40 mg total) by mouth every evening.  30 tablet  6  . venlafaxine (EFFEXOR) 25 MG tablet Take 1 tablet (25 mg total) by mouth 2 (two) times daily.  180 tablet  0   No current facility-administered medications on file prior to visit.    Past Medical History  Diagnosis Date  . Hot flashes   . Over weight   . HLP (hyperkeratosis lenticularis perstans)   . Diabetes mellitus   . Headache(784.0)   . Hyperlipidemia   . Thyroid disease   . Urine incontinence   . Nephrolithiasis     Past Surgical History  Procedure Laterality Date  . Abdominal hysterectomy    . Left shoulder    . Lower back surgery    . Cornea implant    . Tubal ligation  1980  . Vesicovaginal fistula closure w/ tah  1995  . Spine surgery  2005  . Appendectomy      History   Social History  . Marital Status: Married    Spouse Name: N/A    Number of Children: N/A  . Years of Education: N/A   Occupational History  . Retired  Press photographer    Social History Main Topics  . Smoking status: Never Smoker   . Smokeless tobacco: Never Used  . Alcohol Use: No  . Drug Use: No  . Sexual Activity: Not on file   Other Topics Concern  . Not on file   Social History Narrative  . No narrative on file    Family Status  Relation Status Death Age  . Mother Deceased     pancreatic CA  . Father Deceased     lung CA (asbestos exposure)  . Sister Alive     2, breast CA  . Brother Alive     healthy    Review of Systems She continues to try and lose weight through diet changes.  She has lost about 30 lbs this year.  A complete 10 system ROS was obtained and was negative apart from what is mentioned.   Objective:   VITALS:   Filed Vitals:   08/30/13 1040  BP: 130/76  Pulse: 68  Temp: 98.1 F (36.7 C)  Resp: 12  Height: 5\' 4"  (1.626 m)  Weight: 160 lb 11.2 oz (72.893 kg)   Wt Readings from Last 3 Encounters:  08/30/13 160 lb 11.2 oz (72.893 kg)  08/16/13 160 lb 14.4 oz (72.984 kg)  05/02/13 148 lb (67.132 kg)     Gen:  Appears stated age and in NAD. HEENT:  Normocephalic, atraumatic. The mucous membranes are moist. The superficial temporal arteries are without ropiness or tenderness. Cardiovascular: Regular rate and rhythm. Lungs: Clear to auscultation bilaterally. Neck: There are no carotid bruits noted bilaterally.  NEUROLOGICAL:  Orientation:  The patient is alert and oriented x 3.  Recent and remote memory are intact.  Attention span and concentration are normal.  Able to name objects and repeat without trouble.  Fund of knowledge is appropriate Cranial nerves: There is good facial symmetry. The pupils are equal round and reactive to light bilaterally. Fundoscopic exam reveals clear disc margins bilaterally. Extraocular muscles are intact and visual fields are full to confrontational testing. Speech is fluent and clear. Soft palate rises symmetrically and there is no tongue deviation. Hearing is intact  to conversational tone. Tone: Tone is good throughout. Sensation: Sensation is intact to light touch throughout. Coordination:  The patient has no dysdiadichokinesia or dysmetria. Motor: Strength is 5/5 in the bilateral upper and lower extremities.  Shoulder shrug is equal bilaterally.  There is no pronator drift.  There are no fasciculations noted. Gait and Station: The patient is able to ambulate without difficulty.   MOVEMENT EXAM: Tremor:  There has pretty mild tremor but it is worse if she grabs something with weight.   There is no tremor at rest.  The patient is  able to pour water from one glass to another without spilling it.    She does have head titubation.  LABS:  Lab Results  Component Value Date   WBC 4.9 08/23/2012   HGB 12.9 08/23/2012   HCT 39.4 08/23/2012   MCV 90.6 08/23/2012   PLT 150.0 08/23/2012     Chemistry      Component Value Date/Time   NA 140 11/13/2012 0856   K 4.4 11/13/2012 0856   CL 109 11/13/2012 0856   CO2 23 11/13/2012 0856   BUN 21 11/13/2012 0856   CREATININE 0.9 11/13/2012 0856      Component Value Date/Time   CALCIUM 9.6 11/13/2012 0856   ALKPHOS 67 11/13/2012 0856   AST 18 11/13/2012 0856   ALT 22 11/13/2012 0856   BILITOT 0.5 11/13/2012 0856     Lab Results  Component Value Date   TSH 0.51 03/19/2013   Lab Results  Component Value Date   VITAMINB12 >1500* 08/23/2012   Lab Results  Component Value Date   HGBA1C 5.7 07/17/2013       Assessment:    Essential tremor. I agree with the diagnosis.  She has had a long-standing history of this which has gradually progressed and has a strong family history of essential tremor.   Plan:   1.  I am going to continue her with the primidone, 50 mg in the morning and 100 mg at night. Risks, benefits, side effects and alternative therapies were discussed.  The opportunity to ask questions was given and they were answered to the best of my ability.  The patient expressed understanding and willingness to follow  the outlined treatment protocols. 2.  she noticed no improvement when she went back on the propranolol.  I talked about Topamax.  I am somewhat leary as she does have a remote history of nephrolithiasis.  She and I talked about the risks.  She is very aware of the risks, would like to try the medication.  She was educated on drinking any of water.  She will start with 25 mg and we will slowly work our way up to 100 mg daily.  Risks, benefits, side effects and alternative therapies were discussed.  The opportunity to ask questions was given and they were answered to the best of my ability.  The patient expressed understanding and willingness to follow the outlined treatment protocols. 3.   Return in about 8 weeks (around 10/25/2013).

## 2013-09-10 ENCOUNTER — Ambulatory Visit (HOSPITAL_BASED_OUTPATIENT_CLINIC_OR_DEPARTMENT_OTHER): Payer: Medicare Other | Admitting: Lab

## 2013-09-10 ENCOUNTER — Ambulatory Visit (HOSPITAL_BASED_OUTPATIENT_CLINIC_OR_DEPARTMENT_OTHER): Payer: Medicare Other | Admitting: Oncology

## 2013-09-10 ENCOUNTER — Telehealth: Payer: Self-pay | Admitting: Oncology

## 2013-09-10 ENCOUNTER — Encounter: Payer: Self-pay | Admitting: Oncology

## 2013-09-10 VITALS — BP 130/78 | HR 82 | Temp 98.2°F | Resp 18 | Ht 64.0 in | Wt 160.2 lb

## 2013-09-10 DIAGNOSIS — Z803 Family history of malignant neoplasm of breast: Secondary | ICD-10-CM | POA: Diagnosis not present

## 2013-09-10 DIAGNOSIS — N1330 Unspecified hydronephrosis: Secondary | ICD-10-CM

## 2013-09-10 DIAGNOSIS — Z1239 Encounter for other screening for malignant neoplasm of breast: Secondary | ICD-10-CM

## 2013-09-10 DIAGNOSIS — Z1231 Encounter for screening mammogram for malignant neoplasm of breast: Secondary | ICD-10-CM

## 2013-09-10 LAB — BASIC METABOLIC PANEL (CC13)
Anion Gap: 11 mEq/L (ref 3–11)
BUN: 13.6 mg/dL (ref 7.0–26.0)
CO2: 21 mEq/L — ABNORMAL LOW (ref 22–29)
Glucose: 113 mg/dl (ref 70–140)
Potassium: 3.8 mEq/L (ref 3.5–5.1)
Sodium: 142 mEq/L (ref 136–145)

## 2013-09-10 NOTE — Progress Notes (Signed)
OFFICE PROGRESS NOTE   09/10/2013   Physicians:B.Burchette (PCP), Kerin Salen (neurology), K.Clance   INTERVAL HISTORY:  Patient is seen, alone for visit, in scheduled yearly follow up of her high risk potential for breast cancer (no diagnosis of breast cancer). She was previously treated on STAR study, initially with tamoxifen and then raloxifene, completed Sept 2007. She remains on observation now. Most recent mammograms were 3D/ tomo mammography at [facility] 10-25-2012, with heterogeneously dense breast tissue but no other mammographic findings of concern, and yearly follow up recommended. Last gamma imaging was at [facility] 11-2010 and last breast MRI 12-08-2011. Patient is not aware of any changes on breast self-exam.  Patient had MRI LS 02-2013 due to back pain, with multilevel disc disease and nothing of concern for malignancy, but also noted to have "progression of mild bilateral hydronephrosis since 2010" with consideration of renal US mentioned by radiologist. Patient was not aware of the kidney findings and does not seem to have had any further investigation of this. Last chemistries in EMR were prior to the scan, so BMET will be obtained following my visit today. She has no new or different back or flank pain, is voiding without difficulty, no hematuria or UTI symptoms.   ONCOLOGIC HISTORY High risk for breast cancer due to history of breast cancer in premenopausal sister, personal hx dense breast tissue and multiple previous breast biopsies. Treatment on STAR study as above, thru Sept 2007.  Review of systems as above, also: Appetite and energy at usual fairly good baseline. Essential tremor is being addressed by Dr Tat of neurology. No recent infectious illness. She continues to have hot flashes, tho these are not as severe as they were in years past. Remainder of 10 point Review of Systems negative.  Objective:  Vital signs in last 24 hours:  BP 130/78  Pulse 82  Temp(Src) 98.2  F (36.8 C) (Oral)  Resp 18  Ht 5\' 4"  (1.626 m)  Wt 160 lb 3.2 oz (72.666 kg)  BMI 27.48 kg/m2  Alert, oriented and appropriate. Ambulatory without difficulty. Looks comfortable, respirations not labored RA, resting tremor UE.   HEENT:PERRL, sclerae not icteric. Oral mucosa moist without lesions, posterior pharynx clear.  Neck supple. No JVD.  Lymphatics:no cervical,suraclavicular, axillary or inguinal adenopathy Resp: clear to auscultation bilaterally and normal percussion bilaterally Cardio: regular rate and rhythm. No gallop. GI: soft, nontender, not distended, no mass or organomegaly. Normally active bowel sounds.  Musculoskeletal/ Extremities: without pitting edema, cords, tenderness. Back not tender. Neuro: tremor as above, otherwise nonfocal Skin without rash, ecchymosis, petechiae Breasts: bilaterally without dominant mass, skin or nipple findings, tho tissue is dense to palpation thruout. Multiple small scars from previous biopsies. Axillae benign.   Lab Results: BMET available after visit: Na 142, K 3.8, Cl 110, CO2 21, glu 113, BUN 13, creat 0.9, Ca 9.6 (BMET done due to question of bilateral hydronephrosis on MR LS from 02-16-13) Studies/Results: DIGITAL BILATERAL SCREENING MAMMOGRAM WITH CAD  DIGITAL BREAST TOMOSYNTHESIS    [facility] Digital breast tomosynthesis images are acquired in two  projections. These images are reviewed in combination with the  digital mammogram, confirming the findings below.  Comparison: 09/30/2006  FINDINGS:  ACR Breast Density Category 3: The breast tissue is heterogeneously  dense.  There is no suspicious dominant mass, architectural distortion, or  calcification to suggest malignancy.  Images were processed with CAD.  IMPRESSION:  No mammographic evidence of malignancy.  A result letter of this screening mammogram will  be mailed directly  to the patient.  RECOMMENDATION:  Screening mammogram in one year.  (Code:SM-B-01Y)    *RADIOLOGY REPORT* 02-16-2013 Clinical Data: Low back pain. Left hip and leg pain. Prior back  surgery.  BUN and creatinine were obtained on site at [facility] at  [address]  Results: BUN 10 mg/dL, Creatinine 0.9 mg/dL.  MRI LUMBAR SPINE WITHOUT AND WITH CONTRAST   Technique: Multiplanar and multiecho pulse sequences of the lumbar  spine were obtained without and with intravenous contrast.  Contrast: 14mL MULTIHANCE GADOBENATE DIMEGLUMINE 529 MG/ML IV SOLN  Comparison: Lumbar MRI of 02/18/2009  Findings: Normal lumbar alignment. Negative for fracture or mass  lesion. No enhancing lesions are seen following contrast  administration. Conus medullaris is normal and terminates at L1-2.  28 x 22 mm right renal cyst has enlarged since 2010. There is mild  hydronephrosis bilaterally progressive compared with 2010. Renal  ultrasound is suggested.  T11-T12: Small left lateral disc protrusion, improved. No  significant neural impingement  T12-L1: Negative  L1-2: Negative  L2-3: Mild disc and mild facet degeneration.  L3-4: Mild disc bulging and mild facet degeneration. No  significant spinal stenosis.  L4-5: Central and left-sided disc protrusion, with progression  from the prior study. Impingement of the left L5 nerve root is  noted. There is facet hypertrophy with mild spinal stenosis  L5-S1: Mild disc degeneration. Bilateral facet hypertrophy with  mild lateral recess and foraminal narrowing bilaterally.  IMPRESSION:  Mild hydronephrosis bilaterally, with progression since 2010.  Renal ultrasound recommended.  Central and left-sided disc protrusion L4-5 with impingement of the  left L5 nerve root. This has progressed since 2010.      Medications: I have reviewed the patient's current medications.  DISCUSSION: we have discussed the kidney findings mentioned on MR LS done for back pain which appears obviously disc related. She is in agreement  with checking chemistries again now (BMET), which I will sent to PCP, and she will contact PCP office for visit or other instructions from here. I did not set up renal US, tho this may be appropriate for completeness.  Assessment/Plan: 1.high risk for development of breast cancer based on dense breast tissue, family history of breast cancer in premenopausal sister and multiple previous breast biopsies. Previously treated on STAR study as above. Will image with 3D/tomo mammograms due to dense tissue, due in Jan. I will see her back at least in a year, or sooner if needed. Sister declined genetics testing, which cannot be done on patient as she does not have actual cancer diagnosis. I will see her again in a year or sooner if needed. 2. Mild bilateral hydronephrosis seen on MRI LS 02-2013, no renal US or other evaluation as yet. BMET resulted after visit with good BUN and creat. Patient to f/u with PCP. 3.persistant hot flashes in patient who is not appropriate for hormonal suppression. Unfortunately I am not aware of anything different for these now.   4. Hx ocular fungal infection and corneal implants 2010  5. Diabetes, hypothyroid, elevated lipids  6.post hysterectomy 7. Essential tremor: follow up with neurology    Patient is in agreement with plan above.    LIVESAY,LENNIS P, MD   09/10/2013, 8:53 AM

## 2013-09-10 NOTE — Telephone Encounter (Signed)
, °

## 2013-09-10 NOTE — Patient Instructions (Signed)
MRI lumbar spine May 2014 mentioned mild hydronephrosis bilaterally and suggested renal ultrasound.  We will get BMET to check kidney function today and you should see Dr Caryl Never in case the renal ultrasound is appropriate to do.  3D/ tomo mammograms in Jan 2015

## 2013-09-12 ENCOUNTER — Telehealth: Payer: Self-pay

## 2013-09-12 NOTE — Telephone Encounter (Signed)
Message copied by Lorine Bears on Wed Sep 12, 2013 11:30 AM ------      Message from: Reece Packer      Created: Tue Sep 11, 2013  9:00 AM       Labs seen and need follow up: please let her know renal function fine on these chemistries, which I will also send to Dr Caryl Never. She should still follow up with him in case he wants other imaging of kidneys. Cc LA, TH ------

## 2013-09-12 NOTE — Telephone Encounter (Signed)
Left a message for patient to call Dr. Precious Reel nurse office to review lab results as noted below by Dr. Darrold Span.

## 2013-09-13 ENCOUNTER — Telehealth: Payer: Self-pay | Admitting: Family Medicine

## 2013-09-13 DIAGNOSIS — N1330 Unspecified hydronephrosis: Secondary | ICD-10-CM

## 2013-09-13 NOTE — Telephone Encounter (Signed)
Pt had labs drawn at dr Darrold Span on Monday and needs results

## 2013-09-13 NOTE — Telephone Encounter (Signed)
Dr Darrold Span office gave pt her results and told her to see if dr burchette would like to order additional test etc

## 2013-09-13 NOTE — Telephone Encounter (Signed)
Spoke with Ms. Hammac regarding chemistries as noted below by Dr. Darrold Span.  Patient verbalized understanding.

## 2013-09-13 NOTE — Telephone Encounter (Signed)
Labs are in chart.

## 2013-09-14 IMAGING — MG MM DIGITAL SCREENING BILAT
1 series · 2 of 5 positions shown · non-contrast
Comparison: 09/30/2006

CLINICAL DATA: Screening.

DIGITAL BILATERAL SCREENING MAMMOGRAM WITH CAD
DIGITAL BREAST TOMOSYNTHESIS
Digital breast tomosynthesis images are acquired in two
projections.  These images are reviewed in combination with the
digital mammogram, confirming the findings below.

[L MLO tomo · 2 of 70 frames shown]
[frame 23/70]
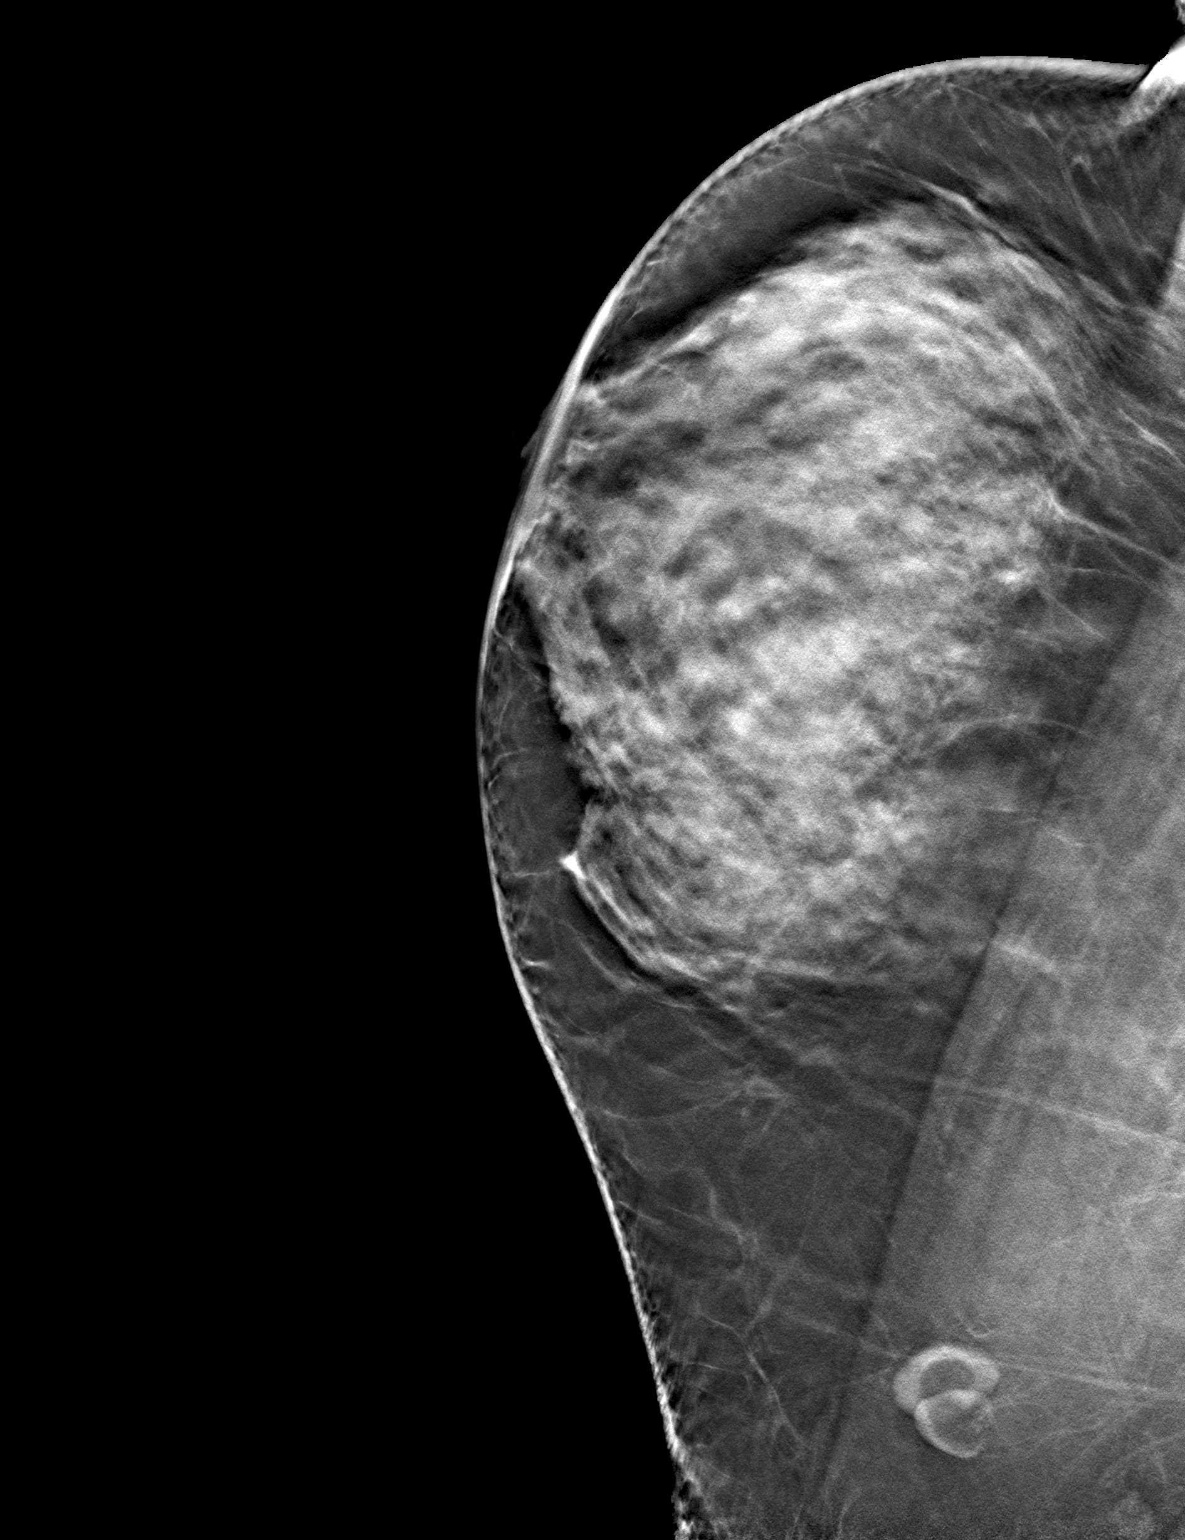
[frame 35/70]
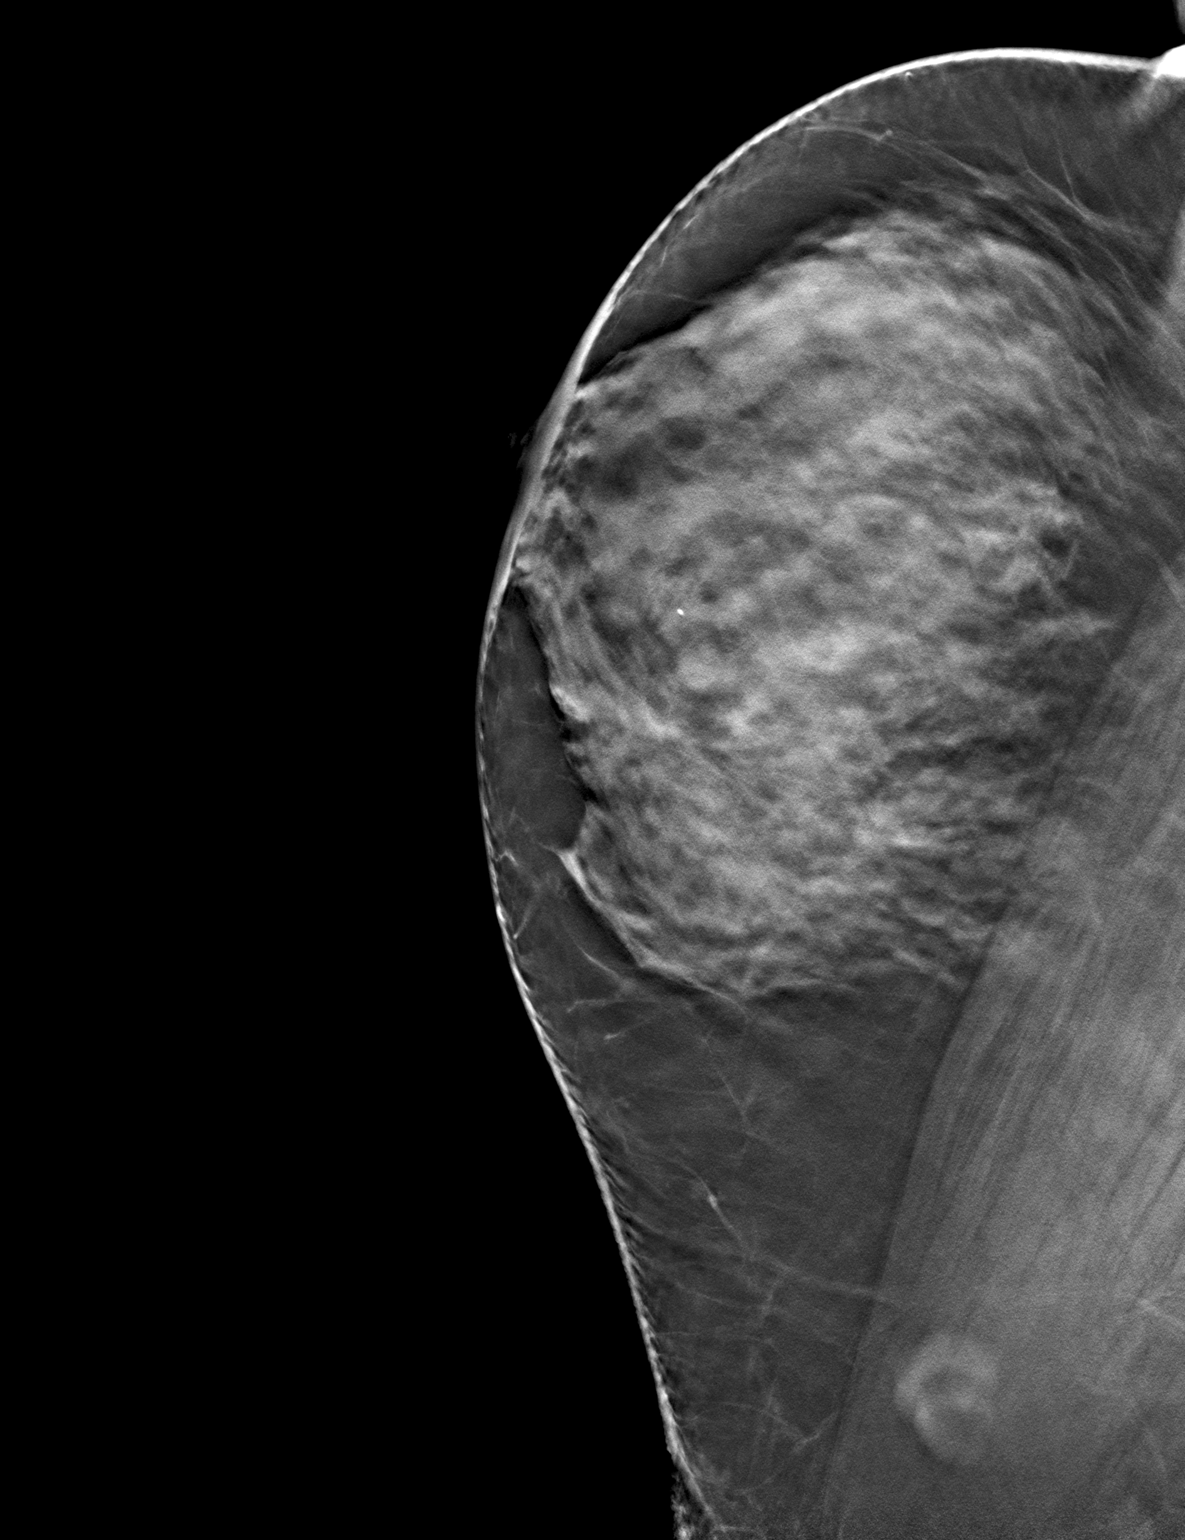

[2 of 5 positions shown; findings below may reference images not displayed]

FINDINGS: ACR Breast Density Category 3: The breast tissue is heterogeneously
dense.

There is no suspicious dominant mass, architectural distortion, or
calcification to suggest malignancy.

Images were processed with CAD.
IMPRESSION: No mammographic evidence of malignancy.

A result letter of this screening mammogram will be mailed directly
to the patient.

RECOMMENDATION:
Screening mammogram in one year. (Code:[id number])

BI-RADS CATEGORY 1:  Negative.

## 2013-09-16 DIAGNOSIS — J019 Acute sinusitis, unspecified: Secondary | ICD-10-CM | POA: Diagnosis not present

## 2013-09-16 NOTE — Telephone Encounter (Signed)
MRI of her back showed some bilateral hydronephrosis with renal ultrasound recommended.  Let pt know I am setting that up.

## 2013-09-17 NOTE — Telephone Encounter (Signed)
Left message for patient to return call.

## 2013-09-17 NOTE — Telephone Encounter (Signed)
Pt informed

## 2013-09-17 NOTE — Telephone Encounter (Signed)
Pt returned your call.  

## 2013-09-20 ENCOUNTER — Ambulatory Visit
Admission: RE | Admit: 2013-09-20 | Discharge: 2013-09-20 | Disposition: A | Discharge status: Home / Self Care | Payer: Medicare Other | Source: Ambulatory Visit | Attending: Family Medicine | Admitting: Family Medicine

## 2013-09-20 DIAGNOSIS — N281 Cyst of kidney, acquired: Secondary | ICD-10-CM | POA: Diagnosis not present

## 2013-09-20 DIAGNOSIS — N1330 Unspecified hydronephrosis: Secondary | ICD-10-CM

## 2013-09-21 ENCOUNTER — Telehealth: Payer: Self-pay | Admitting: Family Medicine

## 2013-09-21 NOTE — Telephone Encounter (Signed)
Patient Information:  Caller Name: Kierstin  Phone: [phone]  Patient: Laura Farrell, Laura Farrell  Gender: Female  DOB: [birth year]  Age: 67 Years  PCP: Evelena Peat (Family Practice)  Office Follow Up:  Does the office need to follow up with this patient?: Yes  Instructions For The Office: Pt requesting to be seen today and wants to come to [place]; office please call pt regarding possible appt  RN Note:  Pt is requesting an appt for today  Symptoms  Reason For Call & Symptoms: Pt states that she started with a dry hacking cough on 09/07/13; has not gotten any better; went to UC on 09/16/13 and was started on Amoxicillin 1000mg  BID for 10 days and Tensilon Perls; has been on antibiotic since 09/15/13 and not feeling any better; no fever; no prod cough;  Reviewed Health History In EMR: Yes  Reviewed Medications In EMR: Yes  Reviewed Allergies In EMR: Yes  Reviewed Surgeries / Procedures: Yes  Date of Onset of Symptoms: 09/07/2013  Treatments Tried: Antibiotic  Treatments Tried Worked: No  Guideline(s) Used:  Cough  Disposition Per Guideline:   See Today in Office  Reason For Disposition Reached:   Sinus pain persists after using nasal washes and pain medicine > 24 hours  Advice Given:  Call Back If:  You become worse.  Patient Will Follow Care Advice:  YES

## 2013-09-21 NOTE — Telephone Encounter (Signed)
Pt stated that she will go to a urgent care today and she will set up appt to be seen next week

## 2013-09-22 ENCOUNTER — Encounter: Payer: Self-pay | Admitting: Internal Medicine

## 2013-09-22 ENCOUNTER — Ambulatory Visit (INDEPENDENT_AMBULATORY_CARE_PROVIDER_SITE_OTHER): Payer: Medicare Other | Admitting: Internal Medicine

## 2013-09-22 VITALS — BP 150/86 | HR 71 | Temp 97.1°F | Resp 20 | Wt 160.0 lb

## 2013-09-22 DIAGNOSIS — J069 Acute upper respiratory infection, unspecified: Secondary | ICD-10-CM | POA: Diagnosis not present

## 2013-09-22 MED ORDER — AZITHROMYCIN 250 MG PO TABS: ORAL_TABLET | ORAL | Status: DC

## 2013-09-22 MED ORDER — AZITHROMYCIN 250 MG PO TABS: ORAL_TABLET | ORAL | Status: AC

## 2013-09-22 MED ORDER — FLUTICASONE PROPIONATE 50 MCG/ACT NA SUSP: 2.0000 | Freq: Every day | NASAL | Status: DC

## 2013-09-22 NOTE — Progress Notes (Signed)
Pre-visit discussion using our clinic review tool. No additional management support is needed unless otherwise documented below in the visit note.  

## 2013-09-22 NOTE — Progress Notes (Signed)
2 weeks of URI sxs Treated with amoxil 7 days ago. She complains of headache and sinus congestion. She has a nonproductive cough. No fever, chills   No Known Allergies Reviewed and updated meds and problem list  Ros: as above, no other complaints  BP 150/86  Pulse 71  Temp(Src) 97.1 F (36.2 C) (Oral)  Resp 20  Wt 160 lb (72.576 kg)  SpO2 97%  Well-developed well-nourished female in no acute distress. HEENT exam atraumatic, normocephalic, extraocular muscles are intact. Neck is supple. No jugular venous distention no thyromegaly. Chest clear to auscultation without increased work of breathing. Cardiac exam S1 and S2 are regular. Abdominal exam active bowel sounds, soft, nontender. Extremities no edema. Neurologic exam she is alert without any motor sensory deficits. Gait is normal.   URI/Atypical infection She has had sxs for 16 days not affected by amoxil Change to zpack for atypical coverage Add fluticasone NS Side effects discussed  Med list updated

## 2013-09-24 ENCOUNTER — Other Ambulatory Visit: Payer: Self-pay

## 2013-09-24 ENCOUNTER — Ambulatory Visit: Payer: Medicare Other | Admitting: Family Medicine

## 2013-09-24 MED ORDER — SIMVASTATIN 40 MG PO TABS: 40.0000 mg | ORAL_TABLET | Freq: Every evening | ORAL | Status: DC

## 2013-10-01 DIAGNOSIS — Z5189 Encounter for other specified aftercare: Secondary | ICD-10-CM | POA: Diagnosis not present

## 2013-10-01 DIAGNOSIS — H2510 Age-related nuclear cataract, unspecified eye: Secondary | ICD-10-CM | POA: Diagnosis not present

## 2013-10-20 ENCOUNTER — Telehealth: Payer: Self-pay | Admitting: Family Medicine

## 2013-10-20 NOTE — Telephone Encounter (Signed)
[place], [place] - [id number] requesting refill of omeprazole (PRILOSEC) 40 MG capsule #180, last filled 07/23/13.

## 2013-10-22 MED ORDER — OMEPRAZOLE 40 MG PO CPDR: 40.0000 mg | DELAYED_RELEASE_CAPSULE | ORAL | Status: DC

## 2013-10-22 NOTE — Telephone Encounter (Signed)
RX sent to pharmacy  

## 2013-10-23 ENCOUNTER — Other Ambulatory Visit: Payer: Self-pay | Admitting: Family Medicine

## 2013-10-23 DIAGNOSIS — N1330 Unspecified hydronephrosis: Secondary | ICD-10-CM

## 2013-10-25 ENCOUNTER — Other Ambulatory Visit: Payer: Self-pay | Admitting: Family Medicine

## 2013-10-25 DIAGNOSIS — N1330 Unspecified hydronephrosis: Secondary | ICD-10-CM

## 2013-10-29 ENCOUNTER — Other Ambulatory Visit: Payer: Medicare Other

## 2013-10-30 ENCOUNTER — Other Ambulatory Visit (INDEPENDENT_AMBULATORY_CARE_PROVIDER_SITE_OTHER): Payer: Medicare Other

## 2013-10-30 DIAGNOSIS — N1330 Unspecified hydronephrosis: Secondary | ICD-10-CM

## 2013-10-30 LAB — BUN: BUN: 13 mg/dL (ref 6–23)

## 2013-10-30 LAB — CREATININE, SERUM: Creatinine, Ser: 0.8 mg/dL (ref 0.4–1.2)

## 2013-10-31 ENCOUNTER — Ambulatory Visit (INDEPENDENT_AMBULATORY_CARE_PROVIDER_SITE_OTHER): Payer: Medicare Other | Admitting: Neurology

## 2013-10-31 ENCOUNTER — Ambulatory Visit
Admission: RE | Admit: 2013-10-31 | Discharge: 2013-10-31 | Disposition: A | Discharge status: Home / Self Care | Payer: Medicare Other | Source: Ambulatory Visit | Attending: Oncology | Admitting: Oncology

## 2013-10-31 ENCOUNTER — Encounter: Payer: Self-pay | Admitting: Neurology

## 2013-10-31 VITALS — BP 118/70 | HR 84 | Resp 16 | Ht 64.0 in | Wt 157.5 lb

## 2013-10-31 DIAGNOSIS — G250 Essential tremor: Secondary | ICD-10-CM | POA: Diagnosis not present

## 2013-10-31 DIAGNOSIS — Z1231 Encounter for screening mammogram for malignant neoplasm of breast: Secondary | ICD-10-CM

## 2013-10-31 DIAGNOSIS — L719 Rosacea, unspecified: Secondary | ICD-10-CM | POA: Diagnosis not present

## 2013-10-31 DIAGNOSIS — L820 Inflamed seborrheic keratosis: Secondary | ICD-10-CM | POA: Diagnosis not present

## 2013-10-31 DIAGNOSIS — D235 Other benign neoplasm of skin of trunk: Secondary | ICD-10-CM | POA: Diagnosis not present

## 2013-10-31 DIAGNOSIS — G252 Other specified forms of tremor: Secondary | ICD-10-CM

## 2013-10-31 MED ORDER — TOPIRAMATE 25 MG PO TABS: ORAL_TABLET | ORAL | Status: DC

## 2013-10-31 MED ORDER — TOPIRAMATE 100 MG PO TABS: 100.0000 mg | ORAL_TABLET | Freq: Two times a day (BID) | ORAL | Status: DC

## 2013-10-31 NOTE — Progress Notes (Signed)
Subjective:    I had the pleasure of seeing the patient back in movement d/o clinic for f/u on ET.  The patient reports that she has had tremor for about 20 years.  It involves both hands, but the L seems worse than the R even though she is R handed.    She is on primidone 50 mg, 2 tablets at night.  I weaned her down on propranolol last visit as we were not sure it was helping.  She was taking 60 mg bid and is now on 20 mg twice a day.  She is noted no difference with the decreased dosage of propranolol.  Tremor has been about the same.  Overall, she is pleased with her current degree of tremor control.   The patient has been diagnosed with mild sleep apnea.  She promised the dr that she would lose weight and she has continued to do that through diet changes.  She has lost a few pounds since last visit.    05/02/13  She was supposed to be primidone,100 mg in the morning and 50 mg at night but she switched that to 50 mg in the morning and 100 mg at night because she was fatigued.   She does state that she was doing fairly good with a combination of primidone and very low dose propranolol, but when the propranolol was completely discontinued, she noted more tremor.   She has ben taking 1/2 of her husbands klonopin at night.  That makes her very sleepy.  She has lost a significant amount of weight.  She reports that last September she weighed 183 pounds and today she was 148 pounds.  She has been watching her diet closely, but has not been exercising.  Overall, she feels much better.  08/29/13 update:  The pt is on primidone 50 mg in the AM, 100 mg at night.  We had restarted propranolol and increased it over the phone because when we previously d/c it she noted more tremor.  She got back up to 60 mg bid and noted no difference in tremor and she d/c it.  She thinks that tremor is getting worse.  She notes difficulty holding food such as cornbread.  Her balance has been good.  10/31/13 update: The pt is on  primidone 50 mg in the AM, 100 mg at night.  Last visit, topiramate was started.  She has no side effects, but has not noticed a big benefits.  The patient did have a renal ultrasound on 09/20/2013.  There was no evidence of hydronephrosis or evidence of nephrolithiasis, but there is a possible lesion in the left kidney.  The patient does state that she was told years ago that she had a cyst.  Nonetheless, she is awaiting a dedicated renal CT.  Otherwise, she is feeling well.  She still has tremors that can be bothersome.  Current/Previously tried tremor medications: primidone/propranolol  Current medications that may exacerbate tremor:  none    No Known Allergies  Current Outpatient Prescriptions on File Prior to Visit  Medication Sig Dispense Refill  . aspirin 81 MG tablet Take 81 mg by mouth daily.      Marland Kitchen levothyroxine (SYNTHROID, LEVOTHROID) 75 MCG tablet Take 1 tablet (75 mcg total) by mouth daily.  90 tablet  3  . omeprazole (PRILOSEC) 40 MG capsule Take 1 capsule (40 mg total) by mouth every other day.  180 capsule  1  . prednisoLONE acetate (PRED FORTE) 1 % ophthalmic suspension  Place 1 drop into the left eye. Once daily      . primidone (MYSOLINE) 50 MG tablet 1 tabs in the morning and 2 at bedtime  90 tablet  3  . simvastatin (ZOCOR) 40 MG tablet Take 1 tablet (40 mg total) by mouth every evening.  30 tablet  11   No current facility-administered medications on file prior to visit.    Past Medical History  Diagnosis Date  . Hot flashes   . Over weight   . HLP (hyperkeratosis lenticularis perstans)   . Diabetes mellitus   . Headache(784.0)   . Hyperlipidemia   . Thyroid disease   . Urine incontinence   . Nephrolithiasis     Past Surgical History  Procedure Laterality Date  . Abdominal hysterectomy    . Left shoulder    . Lower back surgery    . Cornea implant    . Tubal ligation  1980  . Vesicovaginal fistula closure w/ tah  1995  . Spine surgery  2005  .  Appendectomy      History   Social History  . Marital Status: Married    Spouse Name: N/A    Number of Children: N/A  . Years of Education: N/A   Occupational History  . Retired Probation officer    Social History Main Topics  . Smoking status: Never Smoker   . Smokeless tobacco: Never Used  . Alcohol Use: No  . Drug Use: No  . Sexual Activity: Not on file   Other Topics Concern  . Not on file   Social History Narrative  . No narrative on file    Family Status  Relation Status Death Age  . Mother Deceased     pancreatic CA  . Father Deceased     lung CA (asbestos exposure)  . Sister Alive     2, breast CA  . Brother Alive     healthy    Review of Systems   A complete 10 system ROS was obtained and was negative apart from what is mentioned.   Objective:   VITALS:   Filed Vitals:   10/31/13 0834  BP: 118/70  Pulse: 84  Resp: 16  Height: 5\' 4"  (1.626 m)  Weight: 157 lb 8 oz (71.442 kg)   Wt Readings from Last 3 Encounters:  10/31/13 157 lb 8 oz (71.442 kg)  09/22/13 160 lb (72.576 kg)  09/10/13 160 lb 3.2 oz (72.666 kg)     Gen:  Appears stated age and in NAD. HEENT:  Normocephalic, atraumatic. The mucous membranes are moist. The superficial temporal arteries are without ropiness or tenderness. Cardiovascular: Regular rate and rhythm. Lungs: Clear to auscultation bilaterally. Neck: There are no carotid bruits noted bilaterally.  NEUROLOGICAL:  Orientation:  The patient is alert and oriented x 3.  Recent and remote memory are intact.  Attention span and concentration are normal.  Able to name objects and repeat without trouble.  Fund of knowledge is appropriate   MOVEMENT EXAM: Tremor:  There has pretty mild tremor of the outstretched hands today.   There is no tremor at rest.  The patient is  able to pour water from one glass to another without spilling it.    She does have head titubation.  LABS:  Lab Results  Component Value Date   WBC 4.9  08/23/2012   HGB 12.9 08/23/2012   HCT 39.4 08/23/2012   MCV 90.6 08/23/2012   PLT 150.0 08/23/2012  Chemistry      Component Value Date/Time   NA 142 09/10/2013 0913   NA 140 11/13/2012 0856   K 3.8 09/10/2013 0913   K 4.4 11/13/2012 0856   CL 109 11/13/2012 0856   CO2 21* 09/10/2013 0913   CO2 23 11/13/2012 0856   BUN 13 10/30/2013 0810   BUN 13.6 09/10/2013 0913   CREATININE 0.8 10/30/2013 0810   CREATININE 0.9 09/10/2013 0913      Component Value Date/Time   CALCIUM 9.6 09/10/2013 0913   CALCIUM 9.6 11/13/2012 0856   ALKPHOS 67 11/13/2012 0856   AST 18 11/13/2012 0856   ALT 22 11/13/2012 0856   BILITOT 0.5 11/13/2012 0856     Lab Results  Component Value Date   TSH 0.51 03/19/2013   Lab Results  Component Value Date   VITAMINB12 >1500* 08/23/2012   Lab Results  Component Value Date   HGBA1C 5.7 07/17/2013       Assessment:    Essential tremor. I agree with the diagnosis.  She has had a long-standing history of this which has gradually progressed and has a strong family history of essential tremor.   Plan:   1.  I am going to continue her with the primidone, 50 mg in the morning and 100 mg at night. Risks, benefits, side effects and alternative therapies were discussed.  The opportunity to ask questions was given and they were answered to the best of my ability.  The patient expressed understanding and willingness to follow the outlined treatment protocols. 2.  she noticed no improvement when she went back on the propranolol.  I talked again about Topamax.  I am somewhat leary as she does have a remote history of nephrolithiasis.  She and I talked about the risks.  She is very aware of the risks, would like to stay on the medication and to try to increase it to 100 mg twice a day.  Risks, benefits, side effects and alternative therapies were discussed.  The opportunity to ask questions was given and they were answered to the best of my ability.  The patient expressed understanding and  willingness to follow the outlined treatment protocols. 3.   Return in about 3 months (around 01/29/2014).

## 2013-11-01 ENCOUNTER — Ambulatory Visit (INDEPENDENT_AMBULATORY_CARE_PROVIDER_SITE_OTHER)
Admission: RE | Admit: 2013-11-01 | Discharge: 2013-11-01 | Disposition: A | Discharge status: Home / Self Care | Payer: Medicare Other | Source: Ambulatory Visit | Attending: Family Medicine | Admitting: Family Medicine

## 2013-11-01 ENCOUNTER — Other Ambulatory Visit: Payer: Self-pay | Admitting: Family Medicine

## 2013-11-01 DIAGNOSIS — N1330 Unspecified hydronephrosis: Secondary | ICD-10-CM

## 2013-11-01 DIAGNOSIS — N281 Cyst of kidney, acquired: Secondary | ICD-10-CM | POA: Diagnosis not present

## 2013-11-01 MED ORDER — IOHEXOL 350 MG/ML SOLN
80.0000 mL | Freq: Once | INTRAVENOUS | Status: AC | PRN
  Administered 2013-11-01: 80 mL via INTRAVENOUS

## 2013-11-21 ENCOUNTER — Telehealth: Payer: Self-pay | Admitting: Neurology

## 2013-11-21 MED ORDER — PRIMIDONE 50 MG PO TABS: ORAL_TABLET | ORAL | Status: DC

## 2013-11-21 NOTE — Telephone Encounter (Signed)
RX refill request for Primidone 50 mg tablet #90 with 3 refills approved and e-scribed to [facility]. Last office note states for patient to continue RX.

## 2013-11-26 ENCOUNTER — Ambulatory Visit: Payer: Medicare Other | Admitting: Neurology

## 2013-12-03 ENCOUNTER — Encounter: Payer: Self-pay | Admitting: Neurology

## 2013-12-03 ENCOUNTER — Ambulatory Visit (INDEPENDENT_AMBULATORY_CARE_PROVIDER_SITE_OTHER): Payer: Medicare Other | Admitting: Neurology

## 2013-12-03 VITALS — BP 106/66 | HR 80 | Resp 14 | Ht 64.0 in | Wt 157.1 lb

## 2013-12-03 DIAGNOSIS — R413 Other amnesia: Secondary | ICD-10-CM

## 2013-12-03 DIAGNOSIS — G252 Other specified forms of tremor: Secondary | ICD-10-CM | POA: Diagnosis not present

## 2013-12-03 DIAGNOSIS — G250 Essential tremor: Secondary | ICD-10-CM | POA: Diagnosis not present

## 2013-12-03 MED ORDER — TOPIRAMATE 100 MG PO TABS: 100.0000 mg | ORAL_TABLET | Freq: Two times a day (BID) | ORAL | Status: DC

## 2013-12-03 MED ORDER — PRIMIDONE 50 MG PO TABS: ORAL_TABLET | ORAL | Status: DC
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
## 2013-12-05 ENCOUNTER — Other Ambulatory Visit: Payer: Medicare Other

## 2013-12-12 ENCOUNTER — Ambulatory Visit: Payer: Medicare Other | Admitting: Family Medicine

## 2013-12-14 ENCOUNTER — Other Ambulatory Visit: Payer: Medicare Other

## 2013-12-18 ENCOUNTER — Telehealth: Payer: Self-pay | Admitting: Family Medicine

## 2013-12-18 ENCOUNTER — Other Ambulatory Visit: Payer: Self-pay | Admitting: Family Medicine

## 2013-12-18 DIAGNOSIS — E039 Hypothyroidism, unspecified: Secondary | ICD-10-CM

## 2013-12-18 NOTE — Telephone Encounter (Signed)
yes

## 2013-12-18 NOTE — Telephone Encounter (Signed)
Labs are ordered 

## 2013-12-18 NOTE — Telephone Encounter (Addendum)
Pt is aware that labs has been added

## 2013-12-18 NOTE — Telephone Encounter (Signed)
Pt is sch for fasting blood work on in April. Pt would like to add cmp and tsh to order. Can I sch?

## 2013-12-19 ENCOUNTER — Other Ambulatory Visit: Payer: Medicare Other

## 2013-12-28 ENCOUNTER — Ambulatory Visit: Payer: Medicare Other | Admitting: Family Medicine

## 2014-01-07 ENCOUNTER — Telehealth: Payer: Self-pay | Admitting: Neurology

## 2014-01-07 NOTE — Telephone Encounter (Signed)
Pt  Said her tremors are getting worst and she would like to talk to someone please call [phone]

## 2014-01-07 NOTE — Telephone Encounter (Signed)
Spoke with patient and appt moved to tomorrow to discuss.

## 2014-01-07 NOTE — Telephone Encounter (Signed)
Spoke with patient. She states her tremor has been getting worse for awhile. Reading her last office note it looks like she seemed better the last time she was in the office. She states that her husband has noticed it getting worse over the last couple weeks. When I tried to get an idea of how this is getting worse and how it is affecting her, she was very focused on how her husband is noticing it. She stated how her husband commented to her on how much a newspaper was shaking when she was reading it, but she was still able to read it herself. Please advise.

## 2014-01-07 NOTE — Telephone Encounter (Signed)
Find out if it is bothering her or interfering with her ADL's.  She has an appointment in April in few weeks.  Does she want to move that up?

## 2014-01-08 ENCOUNTER — Ambulatory Visit (INDEPENDENT_AMBULATORY_CARE_PROVIDER_SITE_OTHER): Payer: Medicare Other | Admitting: Neurology

## 2014-01-08 ENCOUNTER — Encounter: Payer: Self-pay | Admitting: Neurology

## 2014-01-08 VITALS — BP 98/66 | HR 84 | Resp 16 | Ht 64.0 in | Wt 154.0 lb

## 2014-01-08 DIAGNOSIS — G250 Essential tremor: Secondary | ICD-10-CM

## 2014-01-08 DIAGNOSIS — T50904A Poisoning by unspecified drugs, medicaments and biological substances, undetermined, initial encounter: Secondary | ICD-10-CM

## 2014-01-08 DIAGNOSIS — G252 Other specified forms of tremor: Secondary | ICD-10-CM

## 2014-01-08 DIAGNOSIS — F1996 Other psychoactive substance use, unspecified with psychoactive substance-induced persisting amnestic disorder: Secondary | ICD-10-CM

## 2014-01-08 MED ORDER — PRIMIDONE 50 MG PO TABS: 100.0000 mg | ORAL_TABLET | Freq: Two times a day (BID) | ORAL | Status: DC
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
## 2014-01-08 NOTE — Patient Instructions (Signed)
1.  Decrease topamax to 100mg  - only one tablet at night.  Stop the AM dose 2.  Primidone - 50 mg - take 1 tablet in the AM and 3 at night

## 2014-01-14 DIAGNOSIS — H9209 Otalgia, unspecified ear: Secondary | ICD-10-CM | POA: Diagnosis not present

## 2014-01-14 DIAGNOSIS — H6120 Impacted cerumen, unspecified ear: Secondary | ICD-10-CM | POA: Diagnosis not present

## 2014-01-16 ENCOUNTER — Other Ambulatory Visit (INDEPENDENT_AMBULATORY_CARE_PROVIDER_SITE_OTHER): Payer: Medicare Other

## 2014-01-16 ENCOUNTER — Ambulatory Visit: Payer: Medicare Other | Admitting: Family

## 2014-01-16 DIAGNOSIS — E119 Type 2 diabetes mellitus without complications: Secondary | ICD-10-CM

## 2014-01-16 DIAGNOSIS — E785 Hyperlipidemia, unspecified: Secondary | ICD-10-CM | POA: Diagnosis not present

## 2014-01-16 DIAGNOSIS — E039 Hypothyroidism, unspecified: Secondary | ICD-10-CM

## 2014-01-16 DIAGNOSIS — H6120 Impacted cerumen, unspecified ear: Secondary | ICD-10-CM | POA: Diagnosis not present

## 2014-01-16 LAB — LIPID PANEL
CHOL/HDL RATIO: 4
Cholesterol: 178 mg/dL (ref 0–200)
HDL: 44.8 mg/dL (ref >39.00)
LDL Cholesterol: 93 mg/dL (ref 0–99)
Triglycerides: 199 mg/dL — ABNORMAL HIGH (ref 0.0–149.0)
VLDL: 39.8 mg/dL (ref 0.0–40.0)

## 2014-01-16 LAB — COMPREHENSIVE METABOLIC PANEL
ALT: 17 U/L (ref 0–35)
AST: 15 U/L (ref 0–37)
Albumin: 4.1 g/dL (ref 3.5–5.2)
Alkaline Phosphatase: 68 U/L (ref 39–117)
BILIRUBIN TOTAL: 0.5 mg/dL (ref 0.3–1.2)
BUN: 12 mg/dL (ref 6–23)
CHLORIDE: 107 meq/L (ref 96–112)
CO2: 22 mEq/L (ref 19–32)
Calcium: 9.3 mg/dL (ref 8.4–10.5)
Creatinine, Ser: 0.7 mg/dL (ref 0.4–1.2)
GFR: 85.09 mL/min (ref >60.00)
Glucose, Bld: 102 mg/dL — ABNORMAL HIGH (ref 70–99)
Potassium: 4 mEq/L (ref 3.5–5.1)
Sodium: 138 mEq/L (ref 135–145)
TOTAL PROTEIN: 7.3 g/dL (ref 6.0–8.3)

## 2014-01-16 LAB — HEMOGLOBIN A1C: Hgb A1c MFr Bld: 5.9 % (ref 4.6–6.5)

## 2014-01-16 LAB — TSH: TSH: 2.13 u[IU]/mL (ref 0.35–5.50)

## 2014-01-18 ENCOUNTER — Telehealth: Payer: Self-pay | Admitting: Family Medicine

## 2014-01-18 DIAGNOSIS — H6980 Other specified disorders of Eustachian tube, unspecified ear: Secondary | ICD-10-CM | POA: Diagnosis not present

## 2014-01-18 DIAGNOSIS — H6120 Impacted cerumen, unspecified ear: Secondary | ICD-10-CM | POA: Diagnosis not present

## 2014-01-18 NOTE — Telephone Encounter (Signed)
Noted  

## 2014-01-18 NOTE — Telephone Encounter (Signed)
Patient Information:  Caller Name: Tymeka  Phone: [phone]  Patient: Laura Farrell, Laura Farrell  Gender: Female  DOB: Apr 21, 1946  Age: 68 Years  PCP: Carolann Littler [facility])  Office Follow Up:  Does the office need to follow up with this patient?: No  Instructions For The Office: N/A  RN Note:  Called office and spoke to nurse and she advised to send pt to [facility]. Pt states that she has to wait until her husband comes home at 65; then she will have him take her to the [facility].  Symptoms  Reason For Call & Symptoms: Calling because she is having a hard time hearing out of both ears. Was seen at [facility] x 2 for cough and ear pain--had lots of ear wax removed and was started on Ofloxacin and Mucinex. Ear pain has gotten better but hearing has gotten worse.  Reviewed Health History In EMR: Yes  Reviewed Medications In EMR: Yes  Reviewed Allergies In EMR: Yes  Reviewed Surgeries / Procedures: Yes  Date of Onset of Symptoms: 01/10/2014  Guideline(s) Used:  Hearing Loss  Disposition Per Guideline:   See Within 3 Days in Office  Reason For Disposition Reached:   Recurrent episodes of hearing loss, dizziness, and ringing in the ear  Advice Given:  Call Back If:   You become worse.  Patient Will Follow Care Advice:  YES

## 2014-01-19 ENCOUNTER — Ambulatory Visit: Payer: Medicare Other | Admitting: Internal Medicine

## 2014-01-24 ENCOUNTER — Encounter: Payer: Self-pay | Admitting: Family Medicine

## 2014-01-24 ENCOUNTER — Ambulatory Visit (INDEPENDENT_AMBULATORY_CARE_PROVIDER_SITE_OTHER): Payer: Medicare Other | Admitting: Family Medicine

## 2014-01-24 VITALS — BP 110/68 | HR 74 | Temp 98.4°F | Wt 156.0 lb

## 2014-01-24 DIAGNOSIS — E119 Type 2 diabetes mellitus without complications: Secondary | ICD-10-CM | POA: Diagnosis not present

## 2014-01-24 DIAGNOSIS — E039 Hypothyroidism, unspecified: Secondary | ICD-10-CM | POA: Diagnosis not present

## 2014-01-24 DIAGNOSIS — H6120 Impacted cerumen, unspecified ear: Secondary | ICD-10-CM

## 2014-01-24 DIAGNOSIS — E785 Hyperlipidemia, unspecified: Secondary | ICD-10-CM | POA: Diagnosis not present

## 2014-01-24 NOTE — Progress Notes (Signed)
Subjective:    Patient ID: Laura Farrell, female    DOB: 24-Oct-1945, 68 y.o.   MRN: [id number]  HPI Comments: Patient is a 68 year-old female presenting for a routine follow-up appointment. She would also like to have her ears checked. Patient is taking medications as prescribed. Controlling diabetes with diet alone. Patient is also curious about ultrasound screening due to her high cholesterol.  Ears On April 6 she went to [facility] because of ear pain and hearing loss. The provider attempted to remove bilateral impacted cerumen but was unable to do so. Patient was given OTC ear drops and returned on April 8 to have it removed. She was given prescription ear drops and tried to call to make an appointment at [facility] but could not be seen. She went to [facility] on April 10. Cerumen was removed and she was advised to continue taking drops until Monday. She is no longer having any ear pain or hearing loss but wants to make sure her ears are clear and wants information on how to prevent impaction from occuring again.    Past Medical History  Diagnosis Date  . Hot flashes   . Over weight   . HLP (hyperkeratosis lenticularis perstans)   . Diabetes mellitus   . Headache(784.0)   . Hyperlipidemia   . Thyroid disease   . Urine incontinence   . Nephrolithiasis     Past Surgical History  Procedure Laterality Date  . Abdominal hysterectomy    . Left shoulder    . Lower back surgery    . Cornea implant    . Tubal ligation  1980  . Vesicovaginal fistula closure w/ tah  1995  . Spine surgery  2005  . Appendectomy         Review of Systems  Constitutional: Negative for fever and chills.  HENT: Negative for ear pain and hearing loss.   Respiratory: Negative for shortness of breath.   Cardiovascular: Negative for chest pain.  Gastrointestinal: Negative for nausea, vomiting, abdominal pain, diarrhea and constipation.       Objective:   Physical Exam  Constitutional: She appears well-developed and well-nourished.  Eyes: Conjunctivae are normal. Pupils are equal, round, and reactive to light.  Neck: No mass and no thyromegaly present.  Cardiovascular: Normal rate, normal heart sounds and intact distal pulses.   Pulses:      Radial pulses are 2+ on the right side, and 2+ on the left side.  No carotid bruits heard upon auscultation.   Pulmonary/Chest: Effort normal and breath sounds normal.  Abdominal: Soft. There is no tenderness.  Neurological: She is alert.  Skin: Skin is warm and dry.          Assessment & Plan:  1. Health Maintenance Patient advised to use lukewarm water to irrigate ears once a week to prevent cerumen impaction from occuring again. Patient also given pros and cons of ultrasound screening of carotids, and that the USPTF recommendation is currently against screening. Patient does not need refills on medications at this time. 2. Dyslipidemia Labs reviewed and at this time patient recommended to continue losing weight to lower triglycerides further into reference range.  3. Diabetes Mellitus Labs reviewed and patient recommended to continue diet to control blood sugars.   Bassett, PA-S  Agree with above. Recent labs reviewed.  Compliant with meds,  Hypothyroidism with TSH at goal.  Continue with weight control and current meds.  Darnell Level  Elease Hashimoto, MD

## 2014-01-24 NOTE — Progress Notes (Signed)
Pre visit review using our clinic review tool, if applicable. No additional management support is needed unless otherwise documented below in the visit note. 

## 2014-01-24 NOTE — Patient Instructions (Addendum)
Cerumen Impaction A cerumen impaction is when the wax in your ear forms a plug. This plug usually causes reduced hearing. Sometimes it also causes an earache or dizziness. Removing a cerumen impaction can be difficult and painful. The wax sticks to the ear canal. The canal is sensitive and bleeds easily. If you try to remove a heavy wax buildup with a cotton tipped swab, you may push it in further. Irrigation with water, suction, and small ear curettes may be used to clear out the wax. If the impaction is fixed to the skin in the ear canal, ear drops may be needed for a few days to loosen the wax. People who build up a lot of wax frequently can use ear wax removal products available in your local drugstore. SEEK MEDICAL CARE IF:  You develop an earache, increased hearing loss, or marked dizziness. Document Released: 11/04/2004 Document Revised: 12/20/2011 Document Reviewed: 12/25/2009 [facility] Patient Information 2014 [place], [place]. Place cerumen impaction patient instructions here.

## 2014-01-28 ENCOUNTER — Telehealth: Payer: Self-pay | Admitting: Neurology

## 2014-01-28 DIAGNOSIS — R251 Tremor, unspecified: Secondary | ICD-10-CM

## 2014-01-28 NOTE — Telephone Encounter (Signed)
Spoke with patient- she is okay with getting a second opinion, she would like early AM appt. Who would you like me to refer her to?

## 2014-01-28 NOTE — Telephone Encounter (Signed)
Referral placed to [facility] and they will call with appt.

## 2014-01-28 NOTE — Telephone Encounter (Signed)
I think I'm at a bit of a loss.  When I see her, I visually have a hard time seeing tremor but when I push up medication, she has experienced a lot of SE.  I think that it may be to her best interest if we get another opinion.  Would she be opposed to that?

## 2014-01-28 NOTE — Telephone Encounter (Signed)
Spoke with patient and she states the increase of the Primidone did not help with tremor - please advise.

## 2014-01-28 NOTE — Telephone Encounter (Signed)
Left message on machine for patient to call back.

## 2014-01-28 NOTE — Telephone Encounter (Signed)
Please refer to [facility] for second opinion regarding essential tremor management.

## 2014-01-28 NOTE — Telephone Encounter (Signed)
Pt called regarding the primidone 50mg  one in the am and 3 tablets at night and topiramate 100mg  one tablet a day. She would like to speak to a nurse regarding both medications.

## 2014-01-28 NOTE — Telephone Encounter (Signed)
Returning [name]'s call. Please call back / Laura S.

## 2014-01-29 ENCOUNTER — Telehealth: Payer: Self-pay

## 2014-01-29 NOTE — Telephone Encounter (Signed)
Relevant patient education assigned to patient using Emmi. ° °

## 2014-01-30 ENCOUNTER — Ambulatory Visit: Payer: Medicare Other | Admitting: Neurology

## 2014-02-07 ENCOUNTER — Encounter: Payer: Self-pay | Admitting: Neurology

## 2014-02-07 ENCOUNTER — Encounter (INDEPENDENT_AMBULATORY_CARE_PROVIDER_SITE_OTHER): Payer: Self-pay

## 2014-02-07 ENCOUNTER — Ambulatory Visit (INDEPENDENT_AMBULATORY_CARE_PROVIDER_SITE_OTHER): Payer: Medicare Other | Admitting: Neurology

## 2014-02-07 VITALS — BP 130/78 | HR 68 | Temp 98.2°F | Ht 64.0 in | Wt 156.0 lb

## 2014-02-07 DIAGNOSIS — G252 Other specified forms of tremor: Secondary | ICD-10-CM

## 2014-02-07 DIAGNOSIS — G250 Essential tremor: Secondary | ICD-10-CM

## 2014-02-07 NOTE — Patient Instructions (Addendum)
  Please remember, that any kind of tremor may be exacerbated by anxiety, anger, nervousness, excitement, dehydration, sleep deprivation, by caffeine, and low blood sugar values or blood sugar fluctuations. Some medications, especially some antidepressants and lithium can cause or exacerbate tremors. Tremors may temporarily calm down her subside with the use of a benzodiazepine such as Valium or related medications and with alcohol. Be aware however that drinking alcohol is not an approved treatment or appropriate treatment for tremor control and long-term use of benzodiazepines such as Valium, lorazepam, alprazolam, or clonazepam can cause habit formation, physical and psychological addiction.  Please keep your appointment with Dr. Carles Collet: you may want to try going back on propranolol. There is room to increase the primidone, but with caution. I would not increase the topamax. Talk to your primary doctor about gradually come off of the Effexor to see if your tremor improves, but it may take a few weeks to notice the improvement.   Essential tremor tends to be progressive and unfortunately, there is no cure for it.

## 2014-02-07 NOTE — Progress Notes (Signed)
Subjective:    Patient ID: Laura Farrell is a 68 y.o. female.  HPI    Star Age, MD, PhD [facility] [address], [address] [place], [place] [address]  Dear Wells Guiles,    I saw your patient, Laura Farrell, upon your kind request in my clinic today for second opinion of her tremors. The patient is unaccompanied today. As you know, Laura Farrell is a very pleasant 68 year old right-handed woman with an underlying medical history of kidney disease including bilateral hydrant nephrosis and kidney stones, type 2 diabetes, reflux disease, hypothyroidism, hyperlipidemia, headaches, sleep apnea and obesity, who has had a tremor affecting both upper extremities for decades, probably more than 20 years. She reports a family history of tremors in her mother (head tremor), and her father (hand tremor) and paternal family. Her youngest brother has started noticing a tremor. She has been on symptomatic treatment for years. Currently she is on Mysoline 50 mg strength one pill in the morning and 3 at night which was a recently increased from one pill in the morning and 2 at night. She has had some cognitive complaints and has been advised to reduce her Topamax. She's currently on 100 mg once daily. She has also been on a beta blocker. She was on propranolol 60 mg twice daily but this was reduced because she was not sure it was helping. When she came off of it completely she felt the tremor return, but she is currently not on it. Propranolol in low-dose was the first thing she ever tried. In addition, she has been on other potentially sedating medications including gabapentin, and Effexor, both for hot flashes. She has been on Effexor for the past several years for hot flashes. She was on Tamoxifen for breast cancer prevention and has had about 8 surgeries on the L breast for cystic lesions. In addition she has used her husband's clonazepam to sleep at night and it made her groggy.  At her last visit on 01/08/2014 you know that her tremor was under good control and you barely noticed a residual hand tremor and you noticed no further head tremor. You suggested a second opinion regarding medical management because the patient felt that her tremor was worse and her subjective impression of or tremor seems to stand out of proportion to the objective findings. She has seen Dr. Vertell Limber, who has done her back surgery some 10-12 years ago, and she has talked to him about DBS, but at the time he did not think, she was bad enough and currently she is not ready for it.   Her Past Medical History Is Significant For: Past Medical History  Diagnosis Date  . Hot flashes   . Over weight   . HLP (hyperkeratosis lenticularis perstans)   . Diabetes mellitus   . Headache(784.0)   . Hyperlipidemia   . Thyroid disease   . Urine incontinence   . Nephrolithiasis     Her Past Surgical History Is Significant For: Past Surgical History  Procedure Laterality Date  . Abdominal hysterectomy    . Left shoulder    . Lower back surgery    . Cornea implant    . Tubal ligation  1980  . Vesicovaginal fistula closure w/ tah  1995  . Spine surgery  2005  . Appendectomy      Her Family History Is Significant For: Family History  Problem Relation Age of Onset  . Tremor Mother   . Pancreatic cancer Mother   .  Tremor Father   . Cancer Father     lung  . Tremor Sister   . Tremor Brother     Her Social History Is Significant For: History   Social History  . Marital Status: Married    Spouse Name: N/A    Number of Children: N/A  . Years of Education: N/A   Occupational History  . Retired Probation officer    Social History Main Topics  . Smoking status: Never Smoker   . Smokeless tobacco: Never Used  . Alcohol Use: No  . Drug Use: No  . Sexual Activity: None   Other Topics Concern  . None   Social History Narrative  . None    Her Allergies Are:  No Known Allergies:   Her Current  Medications Are:  Outpatient Encounter Prescriptions as of 02/07/2014  Medication Sig  . gabapentin (NEURONTIN) 600 MG tablet Take 600 mg by mouth 2 (two) times daily.  Marland Kitchen levothyroxine (SYNTHROID, LEVOTHROID) 75 MCG tablet Take 1 tablet (75 mcg total) by mouth daily.  . metroNIDAZOLE (METROCREAM) 0.75 % cream Apply topically as needed.  . minocycline (DYNACIN) 50 MG tablet Take 50 mg by mouth as needed.  Marland Kitchen omeprazole (PRILOSEC) 40 MG capsule Take 1 capsule (40 mg total) by mouth every other day.  . prednisoLONE acetate (PRED FORTE) 1 % ophthalmic suspension Place 1 drop into the left eye. Once daily  . primidone (MYSOLINE) 50 MG tablet Take one tablet in the morning and three (3) tablets ar bedtime.  . simvastatin (ZOCOR) 40 MG tablet Take 1 tablet (40 mg total) by mouth every evening.  . topiramate (TOPAMAX) 100 MG tablet Take 100 mg by mouth at bedtime.  Marland Kitchen venlafaxine (EFFEXOR) 25 MG tablet Take 25 mg by mouth 2 (two) times daily.  :   Review of Systems:  Out of a complete 14 point review of systems, all are reviewed and negative with the exception of these symptoms as listed below:  Review of Systems  Constitutional: Negative.   HENT: Negative.   Eyes: Negative.   Respiratory: Negative.   Cardiovascular: Negative.   Gastrointestinal: Negative.   Endocrine: Negative.   Genitourinary: Negative.   Musculoskeletal: Negative.   Skin: Negative.   Allergic/Immunologic: Negative.   Neurological: Positive for tremors.  Hematological: Negative.   Psychiatric/Behavioral: Negative.     Objective:  Neurologic Exam  Physical Exam Physical Examination:   Filed Vitals:   02/07/14 0828  BP: 130/78  Pulse: 68  Temp: 98.2 F (36.8 C)    General Examination: The patient is a very pleasant 68 y.o. female in no acute distress. She appears well-developed and well-nourished and well groomed.    HEENT: Normocephalic, atraumatic, pupils are equal, round and reactive to light and  accommodation. Funduscopic exam is normal with sharp disc margins noted. She has a touch of cataract on the R and s/p cataract repair in the L and s/p corneal transplant on the L. She wears a soft contact lens on the R and a hard contact on the L. Extraocular tracking is good without limitation to gaze excursion or nystagmus noted. Normal smooth pursuit is noted. Hearing is grossly intact. Tympanic membranes are clear bilaterally. Face is symmetric with normal facial animation and normal facial sensation. Speech is clear with no dysarthria noted. There is no hypophonia. There is no lip tremor, but she has a slight neck/head tremor, yes-yes type, which is intermittent and a very slight voice tremor. Neck is supple with full range of  passive and active motion. There are no carotid bruits on auscultation. Oropharynx exam reveals: mild mouth dryness, adequate dental hygiene and mild airway crowding, due to narrow airway. Mallampati is class II. Tongue protrudes centrally and palate elevates symmetrically.   Chest: Clear to auscultation without wheezing, rhonchi or crackles noted.  Heart: S1+S2+0, regular and normal without murmurs, rubs or gallops noted.   Abdomen: Soft, non-tender and non-distended with normal bowel sounds appreciated on auscultation.  Extremities: There is no pitting edema in the distal lower extremities bilaterally. Pedal pulses are intact.  Skin: Warm and dry without trophic changes noted. There are no varicose veins.  Musculoskeletal: exam reveals no obvious joint deformities, tenderness or joint swelling or erythema.   Neurologically:  Mental status: The patient is awake, alert and oriented in all 4 spheres. Her immediate and remote memory, attention, language skills and fund of knowledge are appropriate. There is no evidence of aphasia, agnosia, apraxia or anomia. Speech is clear with normal prosody and enunciation. Thought process is linear. Mood is normal and affect is normal.   Cranial nerves II - XII are as described above under HEENT exam. In addition: shoulder shrug is normal with equal shoulder height noted. Motor exam: Normal bulk, strength and tone is noted. There is no drift, rest tremor or rebound. There is a bilateral upper extremity postural and action tremor, which is mild in degree. There tremor frequency is fairly fast and the amplitude is small. On Archimedes spiral drawing there is mild tremulousness noted. Handwriting is mildly tremulousness, but legible. There is no evidence of micrographia.  Romberg is negative. Reflexes are 2+ in the UEs and 1+ in the LEs. Babinski: Toes are flexor bilaterally. Fine motor skills and coordination: fairly intact with normal finger taps, normal hand movements, normal rapid alternating patting, normal foot taps and normal foot agility for age.  Cerebellar testing: No dysmetria or intention tremor on finger to nose testing. Heel to shin is unremarkable bilaterally. There is no truncal or gait ataxia.  Sensory exam: intact to light touch, pinprick, vibration, temperature sense in the upper and lower extremities.  Gait, station and balance: She stands easily. No veering to one side is noted. No leaning to one side is noted. Posture is age-appropriate and stance is narrow based. Gait shows normal stride length and normal pace. No problems turning are noted. She turns en bloc. Tandem walk is unremarkable. Intact toe and heel stance is noted.               Assessment and Plan:   In summary, Laura Farrell is a very pleasant 68 y.o.-year old female with an underlying medical history of kidney disease including bilateral hydrant nephrosis and kidney stones, type 2 diabetes, reflux disease, hypothyroidism, hyperlipidemia, headaches, sleep apnea and obesity, whose history and physical exam is consistent with essential tremor. This is mild on examination today but per her report becomes worse at times and has been progressive. I had a long  chat with the patient today regarding essential tremor, its prognosis and treatment options. She is advised that essential tremor is almost invariably progressive and sometimes may stabilize her plateau but then seems to progress faster. There is no cure for this disease and she is already on symptomatic treatment and has essentially tried all first line medications. She has lately not been on a beta blocker, but would like to perhaps restart this. She is advised that there is room for increase in the Mysoline but only with caution,  given her age, her other potentially sedating medications, and the risk for side effects including balance problems, and sedation of course. She is furthermore advised that medications like Effexor can sometimes exacerbate tremors. She's not on it for depression and maybe able to phase out of it. I asked her to discuss this with her primary care physician who is prescribing this at this time. As far as the gabapentin, sometimes it may actually help the tremor and therefore I did not suggest that she change it. Given her cognitive side effects I would not recommend increasing her Topamax and agree with you management approach. She is advised that sometimes medication in combination has to be tried but not everything is always successful and tremors can often be individually very different in terms of response to symptomatic treatment. She has previously discussed DBS with Dr. Vertell Limber and is currently not eager to consider this as an option but I advised her that it may be an option down the road for her. Her physical and neurological exam are otherwise nonfocal and I reassured her in that regard. I certainly do not see any evidence of parkinsonism. He may want to consider a DaT scan for reassurance. We talked about situations that may exacerbate tremors such as dehydration, stress, anxiety, poor sleep and blood sugar fluctuations. She has a thyroid disorder but is under checkup for that  regularly. She has an appointment with you next month and I advised her to keep.   I answered all her questions today and the patient was in agreement with the above outlined plan.  Thank you very much for allowing me to participate in the care of this nice patient. If I can be of any further assistance to you please do not hesitate to call me at [phone].  Sincerely,   Star Age, MD, PhD

## 2014-02-28 DIAGNOSIS — M25819 Other specified joint disorders, unspecified shoulder: Secondary | ICD-10-CM | POA: Diagnosis not present

## 2014-03-05 ENCOUNTER — Telehealth: Payer: Self-pay | Admitting: Family Medicine

## 2014-03-05 DIAGNOSIS — Z947 Corneal transplant status: Secondary | ICD-10-CM | POA: Diagnosis not present

## 2014-03-05 DIAGNOSIS — H2510 Age-related nuclear cataract, unspecified eye: Secondary | ICD-10-CM | POA: Diagnosis not present

## 2014-03-05 DIAGNOSIS — Z961 Presence of intraocular lens: Secondary | ICD-10-CM | POA: Diagnosis not present

## 2014-03-05 DIAGNOSIS — T85398A Other mechanical complication of other ocular prosthetic devices, implants and grafts, initial encounter: Secondary | ICD-10-CM | POA: Diagnosis not present

## 2014-03-05 NOTE — Telephone Encounter (Signed)
Pt would like to know if she could have her blood sugar rechecked. Pt states she is having a concerns with her eyes and thinks it may be her blood sugar. Would like a cb

## 2014-03-06 ENCOUNTER — Ambulatory Visit (INDEPENDENT_AMBULATORY_CARE_PROVIDER_SITE_OTHER): Payer: Medicare Other | Admitting: Neurology

## 2014-03-06 ENCOUNTER — Encounter: Payer: Self-pay | Admitting: Neurology

## 2014-03-06 ENCOUNTER — Other Ambulatory Visit: Payer: Self-pay | Admitting: Family Medicine

## 2014-03-06 ENCOUNTER — Telehealth: Payer: Self-pay | Admitting: Family Medicine

## 2014-03-06 VITALS — BP 102/60 | HR 53 | Ht 64.0 in | Wt 154.0 lb

## 2014-03-06 DIAGNOSIS — G250 Essential tremor: Secondary | ICD-10-CM

## 2014-03-06 DIAGNOSIS — R001 Bradycardia, unspecified: Secondary | ICD-10-CM

## 2014-03-06 DIAGNOSIS — E119 Type 2 diabetes mellitus without complications: Secondary | ICD-10-CM

## 2014-03-06 DIAGNOSIS — G252 Other specified forms of tremor: Secondary | ICD-10-CM

## 2014-03-06 DIAGNOSIS — I498 Other specified cardiac arrhythmias: Secondary | ICD-10-CM | POA: Diagnosis not present

## 2014-03-06 MED ORDER — ETODOLAC 400 MG PO TABS: 400.0000 mg | ORAL_TABLET | Freq: Every day | ORAL | Status: DC

## 2014-03-06 NOTE — Telephone Encounter (Signed)
Pt is currently not taking medication, Is it okay to refill for patient.

## 2014-03-06 NOTE — Telephone Encounter (Signed)
Lab is ordered.  

## 2014-03-06 NOTE — Telephone Encounter (Signed)
OK to get fasting CBG

## 2014-03-06 NOTE — Telephone Encounter (Signed)
[facility] - [place] - [id number] is requesting re-fill on etodolac (LODINE) 400 MG tablet ° °

## 2014-03-06 NOTE — Telephone Encounter (Signed)
Refill once.  Try to avoid regular use at her age.

## 2014-03-06 NOTE — Telephone Encounter (Signed)
Rx sent to pharmacy   

## 2014-03-06 NOTE — Telephone Encounter (Signed)
Pt has been sch for 03-07-14. Pt would like blood work result asap
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
## 2014-03-07 ENCOUNTER — Other Ambulatory Visit (INDEPENDENT_AMBULATORY_CARE_PROVIDER_SITE_OTHER): Payer: Medicare Other

## 2014-03-07 ENCOUNTER — Telehealth: Payer: Self-pay | Admitting: Neurology

## 2014-03-07 DIAGNOSIS — E119 Type 2 diabetes mellitus without complications: Secondary | ICD-10-CM | POA: Diagnosis not present

## 2014-03-07 LAB — BASIC METABOLIC PANEL
BUN: 19 mg/dL (ref 6–23)
CALCIUM: 9.1 mg/dL (ref 8.4–10.5)
CO2: 24 mEq/L (ref 19–32)
Chloride: 110 mEq/L (ref 96–112)
Creatinine, Ser: 0.8 mg/dL (ref 0.4–1.2)
GFR: 72.18 mL/min (ref >60.00)
Glucose, Bld: 94 mg/dL (ref 70–99)
POTASSIUM: 3.8 meq/L (ref 3.5–5.1)
SODIUM: 141 meq/L (ref 135–145)

## 2014-03-07 LAB — POCT CBG (FASTING - GLUCOSE)-MANUAL ENTRY: GLUCOSE FASTING, POC: 106 mg/dL — AB (ref 70–99)

## 2014-03-07 NOTE — Telephone Encounter (Signed)
No.  Call pt please as her BP was really low yesterday.

## 2014-03-07 NOTE — Telephone Encounter (Signed)
Received request from [facility] for refill of propranolol 60 mg to take 1/2 tablet BID. Not what we have on file and last office note states patient unsure of dose. Okay to fill this dose?

## 2014-03-08 DIAGNOSIS — Z5189 Encounter for other specified aftercare: Secondary | ICD-10-CM | POA: Diagnosis not present

## 2014-03-08 DIAGNOSIS — T85398A Other mechanical complication of other ocular prosthetic devices, implants and grafts, initial encounter: Secondary | ICD-10-CM | POA: Diagnosis not present

## 2014-03-08 DIAGNOSIS — H2510 Age-related nuclear cataract, unspecified eye: Secondary | ICD-10-CM | POA: Diagnosis not present

## 2014-03-08 NOTE — Telephone Encounter (Signed)
Left message on machine for patient to call back. To see what exact dose of Propranolol she is taking.

## 2014-03-11 NOTE — Telephone Encounter (Signed)
Left another message on machine for patient to call back.

## 2014-03-18 ENCOUNTER — Telehealth: Payer: Self-pay | Admitting: Neurology

## 2014-03-18 MED ORDER — PROPRANOLOL HCL 20 MG PO TABS: 20.0000 mg | ORAL_TABLET | Freq: Two times a day (BID) | ORAL | Status: DC

## 2014-03-18 NOTE — Telephone Encounter (Signed)
Propranolol sent to patient's pharmacy. Patient aware.

## 2014-03-18 NOTE — Telephone Encounter (Signed)
Yes.  That was what she had told me but not what pharmacy had reported.

## 2014-03-18 NOTE — Telephone Encounter (Signed)
Spoke with patient and she is taking Propranolol 20 mg BID - please advise if okay to send in RX?

## 2014-03-18 NOTE — Telephone Encounter (Signed)
She is returning your call she has been out of town and just got back  Please call [phone]

## 2014-03-20 DIAGNOSIS — Z5189 Encounter for other specified aftercare: Secondary | ICD-10-CM | POA: Diagnosis not present

## 2014-03-20 DIAGNOSIS — H2510 Age-related nuclear cataract, unspecified eye: Secondary | ICD-10-CM | POA: Diagnosis not present

## 2014-03-20 DIAGNOSIS — T85398A Other mechanical complication of other ocular prosthetic devices, implants and grafts, initial encounter: Secondary | ICD-10-CM | POA: Diagnosis not present

## 2014-04-01 DIAGNOSIS — T85398A Other mechanical complication of other ocular prosthetic devices, implants and grafts, initial encounter: Secondary | ICD-10-CM | POA: Diagnosis not present

## 2014-04-01 DIAGNOSIS — Z5189 Encounter for other specified aftercare: Secondary | ICD-10-CM | POA: Diagnosis not present

## 2014-04-01 DIAGNOSIS — H2510 Age-related nuclear cataract, unspecified eye: Secondary | ICD-10-CM | POA: Diagnosis not present

## 2014-04-04 ENCOUNTER — Telehealth: Payer: Self-pay | Admitting: Family Medicine

## 2014-04-04 DIAGNOSIS — M25819 Other specified joint disorders, unspecified shoulder: Secondary | ICD-10-CM | POA: Diagnosis not present

## 2014-04-04 DIAGNOSIS — M7500 Adhesive capsulitis of unspecified shoulder: Secondary | ICD-10-CM | POA: Diagnosis not present

## 2014-04-04 MED ORDER — LEVOTHYROXINE SODIUM 75 MCG PO TABS: 75.0000 ug | ORAL_TABLET | Freq: Every day | ORAL | Status: DC

## 2014-04-04 MED ORDER — ETODOLAC 400 MG PO TABS: 400.0000 mg | ORAL_TABLET | Freq: Every day | ORAL | Status: DC

## 2014-04-04 NOTE — Telephone Encounter (Signed)
Rx's sent to pharmacy.  

## 2014-04-04 NOTE — Telephone Encounter (Signed)
levothyroxine (SYNTHROID, LEVOTHROID) 75 MCG tablet re-fill is needed

## 2014-04-04 NOTE — Telephone Encounter (Signed)
[place], [place] - [address] is requesting re-fill on etodolac (LODINE) 400 MG tablet

## 2014-04-11 DIAGNOSIS — M25819 Other specified joint disorders, unspecified shoulder: Secondary | ICD-10-CM | POA: Diagnosis not present

## 2014-04-23 DIAGNOSIS — M719 Bursopathy, unspecified: Secondary | ICD-10-CM | POA: Diagnosis not present

## 2014-04-23 DIAGNOSIS — Z5189 Encounter for other specified aftercare: Secondary | ICD-10-CM | POA: Diagnosis not present

## 2014-04-23 DIAGNOSIS — M67919 Unspecified disorder of synovium and tendon, unspecified shoulder: Secondary | ICD-10-CM | POA: Diagnosis not present

## 2014-04-23 DIAGNOSIS — M75110 Incomplete rotator cuff tear or rupture of unspecified shoulder, not specified as traumatic: Secondary | ICD-10-CM | POA: Diagnosis not present

## 2014-05-02 ENCOUNTER — Telehealth: Payer: Self-pay | Admitting: Family Medicine

## 2014-05-02 MED ORDER — GABAPENTIN 600 MG PO TABS: 600.0000 mg | ORAL_TABLET | Freq: Two times a day (BID) | ORAL | Status: DC

## 2014-05-02 NOTE — Telephone Encounter (Signed)
[place]  - [address] is requesting re-fill on gabapentin (NEURONTIN) 600 MG tablet

## 2014-05-02 NOTE — Telephone Encounter (Signed)
Rx sent to pharmacy   

## 2014-05-03 DIAGNOSIS — Z5189 Encounter for other specified aftercare: Secondary | ICD-10-CM | POA: Diagnosis not present

## 2014-05-03 DIAGNOSIS — H2510 Age-related nuclear cataract, unspecified eye: Secondary | ICD-10-CM | POA: Diagnosis not present

## 2014-05-03 DIAGNOSIS — T85398A Other mechanical complication of other ocular prosthetic devices, implants and grafts, initial encounter: Secondary | ICD-10-CM | POA: Diagnosis not present

## 2014-06-03 DIAGNOSIS — T85398A Other mechanical complication of other ocular prosthetic devices, implants and grafts, initial encounter: Secondary | ICD-10-CM | POA: Diagnosis not present

## 2014-06-03 DIAGNOSIS — H2510 Age-related nuclear cataract, unspecified eye: Secondary | ICD-10-CM | POA: Diagnosis not present

## 2014-06-03 DIAGNOSIS — Z5189 Encounter for other specified aftercare: Secondary | ICD-10-CM | POA: Diagnosis not present

## 2014-06-05 ENCOUNTER — Ambulatory Visit (INDEPENDENT_AMBULATORY_CARE_PROVIDER_SITE_OTHER): Payer: Medicare Other | Admitting: Family Medicine

## 2014-06-05 ENCOUNTER — Encounter: Payer: Self-pay | Admitting: Family Medicine

## 2014-06-05 VITALS — BP 110/68 | HR 64 | Temp 97.8°F | Wt 161.0 lb

## 2014-06-05 DIAGNOSIS — R635 Abnormal weight gain: Secondary | ICD-10-CM

## 2014-06-05 DIAGNOSIS — M7500 Adhesive capsulitis of unspecified shoulder: Secondary | ICD-10-CM | POA: Diagnosis not present

## 2014-06-05 DIAGNOSIS — R5383 Other fatigue: Secondary | ICD-10-CM

## 2014-06-05 DIAGNOSIS — R5381 Other malaise: Secondary | ICD-10-CM | POA: Diagnosis not present

## 2014-06-05 DIAGNOSIS — R822 Biliuria: Secondary | ICD-10-CM | POA: Diagnosis not present

## 2014-06-05 DIAGNOSIS — Z5189 Encounter for other specified aftercare: Secondary | ICD-10-CM | POA: Diagnosis not present

## 2014-06-05 LAB — TSH: TSH: 0.71 u[IU]/mL (ref 0.35–4.50)

## 2014-06-05 LAB — CBC WITH DIFFERENTIAL/PLATELET
BASOS ABS: 0 10*3/uL (ref 0.0–0.1)
Basophils Relative: 0.5 % (ref 0.0–3.0)
EOS ABS: 0.1 10*3/uL (ref 0.0–0.7)
Eosinophils Relative: 1.9 % (ref 0.0–5.0)
HCT: 37.3 % (ref 36.0–46.0)
Hemoglobin: 12.5 g/dL (ref 12.0–15.0)
LYMPHS PCT: 46.5 % — AB (ref 12.0–46.0)
Lymphs Abs: 2.2 10*3/uL (ref 0.7–4.0)
MCHC: 33.4 g/dL (ref 30.0–36.0)
MCV: 91.9 fl (ref 78.0–100.0)
Monocytes Absolute: 0.3 10*3/uL (ref 0.1–1.0)
Monocytes Relative: 5.7 % (ref 3.0–12.0)
Neutro Abs: 2.2 10*3/uL (ref 1.4–7.7)
Neutrophils Relative %: 45.4 % (ref 43.0–77.0)
Platelets: 134 10*3/uL — ABNORMAL LOW (ref 150.0–400.0)
RBC: 4.06 Mil/uL (ref 3.87–5.11)
RDW: 13.8 % (ref 11.5–15.5)
WBC: 4.8 10*3/uL (ref 4.0–10.5)

## 2014-06-05 LAB — HEPATIC FUNCTION PANEL
ALT: 15 U/L (ref 0–35)
AST: 16 U/L (ref 0–37)
Albumin: 3.8 g/dL (ref 3.5–5.2)
Alkaline Phosphatase: 61 U/L (ref 39–117)
BILIRUBIN TOTAL: 0.4 mg/dL (ref 0.2–1.2)
Bilirubin, Direct: 0 mg/dL (ref 0.0–0.3)
TOTAL PROTEIN: 6.8 g/dL (ref 6.0–8.3)

## 2014-06-05 LAB — BASIC METABOLIC PANEL
BUN: 16 mg/dL (ref 6–23)
CHLORIDE: 106 meq/L (ref 96–112)
CO2: 25 mEq/L (ref 19–32)
Calcium: 9.3 mg/dL (ref 8.4–10.5)
Creatinine, Ser: 0.8 mg/dL (ref 0.4–1.2)
GFR: 81.08 mL/min (ref >60.00)
Glucose, Bld: 113 mg/dL — ABNORMAL HIGH (ref 70–99)
Potassium: 4.1 mEq/L (ref 3.5–5.1)
Sodium: 141 mEq/L (ref 135–145)

## 2014-06-05 LAB — POCT URINALYSIS DIPSTICK
Blood, UA: NEGATIVE
GLUCOSE UA: NEGATIVE
Nitrite, UA: NEGATIVE
Protein, UA: NEGATIVE
Spec Grav, UA: 1.015
UROBILINOGEN UA: 0.2
pH, UA: 5

## 2014-06-05 NOTE — Addendum Note (Signed)
Addended by: Marcina Millard on: 06/05/2014 01:45 PM   Modules accepted: Orders

## 2014-06-05 NOTE — Progress Notes (Signed)
Pre visit review using our clinic review tool, if applicable. No additional management support is needed unless otherwise documented below in the visit note. 

## 2014-06-05 NOTE — Progress Notes (Signed)
Subjective:    Patient ID: Laura Farrell, female    DOB: 1946-08-29, 68 y.o.   MRN: [id number]  HPI Patient seen with progressive fatigue over the past couple weeks. She states this came on relatively suddenly. She is sleeping up to 16 hours per day. Denies any depressive symptoms. No recent chest pains. She has had some recent weight gain but denies any dietary changes. She has hypothyroidism which is treated. She had labs back in April that were basically normal. She denies any focal weakness. No fevers or chills. Occasional left flank pains. No burning with urination. She also complains of feeling thirsty frequently. No urine frequency. Recent hemoglobin A1c 5.9%.  She has not had any exercise impairment with walking. She recently reduced her dosage of Mysoline which she takes for familial tremor but this has not made an impact on her energy levels. She's been on propranolol low dosage for many years. Compliant with thyroid medication.  Denies any recent new stressors.  Past Medical History  Diagnosis Date  . Hot flashes   . Over weight   . HLP (hyperkeratosis lenticularis perstans)   . Diabetes mellitus   . Headache(784.0)   . Hyperlipidemia   . Thyroid disease   . Urine incontinence   . Nephrolithiasis    Past Surgical History  Procedure Laterality Date  . Abdominal hysterectomy    . Left shoulder    . Lower back surgery    . Cornea implant    . Tubal ligation  1980  . Vesicovaginal fistula closure w/ tah  1995  . Spine surgery  2005  . Appendectomy      reports that she has never smoked. She has never used smokeless tobacco. She reports that she does not drink alcohol or use illicit drugs. family history includes Cancer in her father; Pancreatic cancer in her mother; Tremor in her brother, father, mother, and sister. No Known Allergies    Review of Systems  Constitutional: Positive for fatigue. Negative for fever, chills, appetite change and unexpected weight change.    HENT: Negative for trouble swallowing.   Respiratory: Negative for cough and shortness of breath.   Cardiovascular: Negative for chest pain, palpitations and leg swelling.  Gastrointestinal: Negative for nausea, vomiting, abdominal pain, diarrhea, constipation and blood in stool.  Genitourinary: Negative for dysuria and hematuria.  Musculoskeletal: Negative for arthralgias.  Neurological: Negative for headaches.  Hematological: Negative for adenopathy. Does not bruise/bleed easily.       Objective:   Physical Exam  Constitutional: She is oriented to person, place, and time. She appears well-developed and well-nourished. No distress.  HENT:  Mouth/Throat: Oropharynx is clear and moist.  Neck: Neck supple. No thyromegaly present.  Cardiovascular: Normal rate and regular rhythm.  Exam reveals no gallop.   Pulmonary/Chest: Effort normal and breath sounds normal. No respiratory distress. She has no wheezes. She has no rales.  Abdominal: Soft. She exhibits no mass. There is no tenderness. There is no rebound and no guarding.  Musculoskeletal: She exhibits no edema.  Neurological: She is alert and oriented to person, place, and time.  No focal weakness. Gait is normal Does not demonstrate any easy muscle fatigue ability  Psychiatric: She has a normal mood and affect. Her behavior is normal.          Assessment & Plan:  Patient presents with symptoms of excessive fatigue and increased thirst over past couple weeks. Nonfocal exam. She's had some recent mild weight gain but denies any  lifestyle changes. Check labs - CBC, chemistries, TSH.

## 2014-07-08 DIAGNOSIS — Z5189 Encounter for other specified aftercare: Secondary | ICD-10-CM | POA: Diagnosis not present

## 2014-07-08 DIAGNOSIS — H2510 Age-related nuclear cataract, unspecified eye: Secondary | ICD-10-CM | POA: Diagnosis not present

## 2014-07-08 DIAGNOSIS — T85398A Other mechanical complication of other ocular prosthetic devices, implants and grafts, initial encounter: Secondary | ICD-10-CM | POA: Diagnosis not present

## 2014-07-16 ENCOUNTER — Telehealth: Payer: Self-pay | Admitting: Family Medicine

## 2014-07-16 MED ORDER — OMEPRAZOLE 40 MG PO CPDR: 40.0000 mg | DELAYED_RELEASE_CAPSULE | ORAL | Status: DC

## 2014-07-16 NOTE — Telephone Encounter (Signed)
Rx sent to pharmacy   

## 2014-07-16 NOTE — Telephone Encounter (Signed)
[facility] - [place] - [address] is requesting re-fill on omeprazole (PRILOSEC) 40 MG capsule

## 2014-07-24 ENCOUNTER — Ambulatory Visit: Payer: Medicare Other | Admitting: Family Medicine

## 2014-08-02 DIAGNOSIS — G250 Essential tremor: Secondary | ICD-10-CM | POA: Diagnosis not present

## 2014-08-02 DIAGNOSIS — E039 Hypothyroidism, unspecified: Secondary | ICD-10-CM | POA: Diagnosis not present

## 2014-08-02 DIAGNOSIS — Z79899 Other long term (current) drug therapy: Secondary | ICD-10-CM | POA: Diagnosis not present

## 2014-08-02 DIAGNOSIS — E780 Pure hypercholesterolemia: Secondary | ICD-10-CM | POA: Diagnosis not present

## 2014-08-02 DIAGNOSIS — Z23 Encounter for immunization: Secondary | ICD-10-CM | POA: Diagnosis not present

## 2014-08-02 DIAGNOSIS — K219 Gastro-esophageal reflux disease without esophagitis: Secondary | ICD-10-CM | POA: Diagnosis not present

## 2014-08-02 DIAGNOSIS — E119 Type 2 diabetes mellitus without complications: Secondary | ICD-10-CM | POA: Diagnosis not present

## 2014-08-02 DIAGNOSIS — K5900 Constipation, unspecified: Secondary | ICD-10-CM | POA: Diagnosis not present

## 2014-08-05 DIAGNOSIS — T85398D Other mechanical complication of other ocular prosthetic devices, implants and grafts, subsequent encounter: Secondary | ICD-10-CM | POA: Diagnosis not present

## 2014-08-05 DIAGNOSIS — H2511 Age-related nuclear cataract, right eye: Secondary | ICD-10-CM | POA: Diagnosis not present

## 2014-08-07 ENCOUNTER — Encounter: Payer: Self-pay | Admitting: Neurology

## 2014-08-07 ENCOUNTER — Ambulatory Visit (INDEPENDENT_AMBULATORY_CARE_PROVIDER_SITE_OTHER): Payer: Medicare Other | Admitting: Neurology

## 2014-08-07 VITALS — BP 124/68 | HR 72 | Ht 64.0 in | Wt 168.0 lb

## 2014-08-07 DIAGNOSIS — G250 Essential tremor: Secondary | ICD-10-CM

## 2014-08-07 DIAGNOSIS — N951 Menopausal and female climacteric states: Secondary | ICD-10-CM
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
## 2014-08-12 ENCOUNTER — Telehealth: Payer: Self-pay | Admitting: Neurology

## 2014-08-12 ENCOUNTER — Telehealth: Payer: Self-pay | Admitting: Family Medicine

## 2014-08-12 NOTE — Telephone Encounter (Signed)
Pt stated that a doctor from [facility] put her on the medication.  She takes one tablet daily.   #90

## 2014-08-12 NOTE — Telephone Encounter (Signed)
Received refill request for Effexor 25 mg BID. Did you prescribe this?

## 2014-08-12 NOTE — Telephone Encounter (Signed)
I don't see this on here list and have no idea where this has come from.  Has she been taking this?

## 2014-08-12 NOTE — Telephone Encounter (Signed)
[place] [facility], [place] - [address] is requesting re-fill on metFORMIN (GLUCOPHAGE) 500 MG tablet

## 2014-08-12 NOTE — Telephone Encounter (Signed)
I'm not sure how we missed this medication.  OK to add to her list and refill for 6 months.

## 2014-08-12 NOTE — Telephone Encounter (Signed)
No, she had d/c it as Dr. Rexene Alberts thought that it might be causing her tremor to increase and she told me that her hot flashes came back after she d/c it and asked if i thought that it would be okay from a tremor standpoint to restart and I told her that I had no objection but think that her PCP gives her the RX

## 2014-08-12 NOTE — Telephone Encounter (Signed)
RX denied and faxed to [facility]

## 2014-08-12 NOTE — Telephone Encounter (Signed)
Is patient suppose to be taking this medication it is not on her current list.

## 2014-08-13 MED ORDER — METFORMIN HCL 500 MG PO TABS: 500.0000 mg | ORAL_TABLET | Freq: Every day | ORAL | Status: DC

## 2014-08-13 NOTE — Telephone Encounter (Signed)
Rx sent to pharmacy   

## 2014-08-23 ENCOUNTER — Telehealth: Payer: Self-pay | Admitting: Oncology

## 2014-08-23 NOTE — Telephone Encounter (Signed)
SCHEDULE CHANGE MOVED 12/2 APPTS TO 12/3. S/W PT SHE IS AWARE.

## 2014-08-28 ENCOUNTER — Ambulatory Visit: Payer: Medicare Other | Admitting: Family Medicine

## 2014-09-06 ENCOUNTER — Other Ambulatory Visit: Payer: Self-pay | Admitting: Oncology

## 2014-09-06 DIAGNOSIS — D696 Thrombocytopenia, unspecified: Secondary | ICD-10-CM

## 2014-09-11 ENCOUNTER — Ambulatory Visit: Payer: Medicare Other | Admitting: Oncology

## 2014-09-11 ENCOUNTER — Other Ambulatory Visit: Payer: Medicare Other

## 2014-09-12 ENCOUNTER — Ambulatory Visit (HOSPITAL_BASED_OUTPATIENT_CLINIC_OR_DEPARTMENT_OTHER): Payer: Medicare Other | Admitting: Oncology

## 2014-09-12 ENCOUNTER — Other Ambulatory Visit (HOSPITAL_BASED_OUTPATIENT_CLINIC_OR_DEPARTMENT_OTHER): Payer: Medicare Other

## 2014-09-12 ENCOUNTER — Telehealth: Payer: Self-pay | Admitting: Oncology

## 2014-09-12 ENCOUNTER — Encounter: Payer: Self-pay | Admitting: Oncology

## 2014-09-12 VITALS — BP 131/64 | HR 70 | Temp 97.9°F | Resp 18 | Ht 64.0 in | Wt 170.4 lb

## 2014-09-12 DIAGNOSIS — Z1231 Encounter for screening mammogram for malignant neoplasm of breast: Secondary | ICD-10-CM

## 2014-09-12 DIAGNOSIS — R9230 Dense breasts, unspecified: Secondary | ICD-10-CM

## 2014-09-12 DIAGNOSIS — J209 Acute bronchitis, unspecified: Secondary | ICD-10-CM | POA: Diagnosis not present

## 2014-09-12 DIAGNOSIS — R922 Inconclusive mammogram: Secondary | ICD-10-CM

## 2014-09-12 DIAGNOSIS — D696 Thrombocytopenia, unspecified: Secondary | ICD-10-CM

## 2014-09-12 LAB — CBC WITH DIFFERENTIAL/PLATELET
BASO%: 0.2 % (ref 0.0–2.0)
Basophils Absolute: 0 10*3/uL (ref 0.0–0.1)
EOS%: 2.3 % (ref 0.0–7.0)
Eosinophils Absolute: 0.1 10*3/uL (ref 0.0–0.5)
HCT: 35.6 % (ref 34.8–46.6)
HGB: 11.9 g/dL (ref 11.6–15.9)
LYMPH#: 1.6 10*3/uL (ref 0.9–3.3)
LYMPH%: 36 % (ref 14.0–49.7)
MCH: 30.4 pg (ref 25.1–34.0)
MCHC: 33.4 g/dL (ref 31.5–36.0)
MCV: 90.8 fL (ref 79.5–101.0)
MONO#: 0.4 10*3/uL (ref 0.1–0.9)
MONO%: 8.7 % (ref 0.0–14.0)
NEUT#: 2.3 10*3/uL (ref 1.5–6.5)
NEUT%: 52.8 % (ref 38.4–76.8)
PLATELETS: 139 10*3/uL — AB (ref 145–400)
RBC: 3.92 10*6/uL (ref 3.70–5.45)
RDW: 13.7 % (ref 11.2–14.5)
WBC: 4.4 10*3/uL (ref 3.9–10.3)

## 2014-09-12 LAB — MORPHOLOGY: PLT EST: DECREASED

## 2014-09-12 MED ORDER — AZITHROMYCIN 250 MG PO TABS: ORAL_TABLET | ORAL | Status: DC

## 2014-09-12 MED ORDER — GUAIFENESIN ER 600 MG PO TB12: 600.0000 mg | ORAL_TABLET | Freq: Two times a day (BID) | ORAL | Status: DC

## 2014-09-12 NOTE — Progress Notes (Signed)
OFFICE PROGRESS NOTE   09/12/2014   Physicians:B.Burchette (PCP), Alonza Bogus (neurology), K.Clance  INTERVAL HISTORY:  Patient is seen in yearly follow up of high risk for breast cancer based on family history of breast cancer in premenopausal sister, and dense breast tissue with multiple biopsies herself. She is on observation since completing tamoxifen/ raloxifene on study Sept 2007. Most recent bilateral tomo mamography was at [facility] 10-31-13. She has had no recent concerns from this standpoint.  CBC by Dr Elease Hashimoto [phone] had slightly low platelets at 134k, without bleeding or unusual bruising; rest of blood counts and chemistries good.  Patient has had upper and lower respiratory infection for last 8 days, still with significant sinus congestion and drainage and cough productive of thick yellowish sputum. She denies fever or chills, ear pain, chest pain, SOB. She has had no other recent infectious illness, and was feeling generally well prior to this illness. Hot flashes persist, tho have been worse in past.  She has no PAC. She has not had genetics testing. Sister with premenopausal breast cancer has not agreed to testing to this point; I have told patient that insurances including Medicare have been more willing to allow genetics testing. She will discuss again with sister; I have given her names of genetics counselors at [facility], in case she or sister have other questions.    ONCOLOGIC HISTORY High risk for breast cancer due to history of breast cancer in premenopausal sister, personal hx dense breast tissue and multiple previous breast biopsies. Treatment on STAR study, initially with tamoxifen and then raloxifene, completed Sept 2007. She remains on observation.  Review of systems as above, also: No new or different pain. No bleeding or bruising. No GI or cardiac symptoms. Tremor some better after a number of medication changes by Dr Carles Collet Angus Seller of 10 point Review of Systems  negative.  Objective:  Vital signs in last 24 hours:  BP 131/64 mmHg  Pulse 70  Temp(Src) 97.9 F (36.6 C) (Oral)  Resp 18  Ht 5\' 4"  (1.626 m)  Wt 170 lb 6.4 oz (77.293 kg)  BMI 29.23 kg/m2  SpO2 100%  Alert, oriented and appropriate. Ambulatory without difficulty.    HEENT:PERRL, sclerae not icteric. Oral mucosa moist without lesions, posterior pharynx clear. Nasal turbinates boggy without purulent drainage. TMs clear.  Neck supple. No JVD.  Lymphatics:no cervical,suraclavicular, axillary or inguinal adenopathy Resp: Scattered fine crackles bilaterally in mid and lower fields, no wheezing, no use of accessory muscles, normal percussion bilaterally. Respirations not labored RA Cardio: regular rate and rhythm. No gallop. GI: soft, nontender, not distended, no mass or organomegaly. Normally active bowel sounds.  Musculoskeletal/ Extremities: without pitting edema, cords, tenderness Neuro:  Resting tremor in hands very minimal today, none with head/ neck presently. Skin without rash, ecchymosis, petechiae Breasts: bilateral scars well healed from benign biopsies, otherwise without dominant mass, skin or nipple findings. Axillae benign.   Lab Results:  Results for orders placed or performed in visit on 09/12/14  CBC with Differential  Result Value Ref Range   WBC 4.4 3.9 - 10.3 10e3/uL   NEUT# 2.3 1.5 - 6.5 10e3/uL   HGB 11.9 11.6 - 15.9 g/dL   HCT 35.6 34.8 - 46.6 %   Platelets 139 (L) 145 - 400 10e3/uL   MCV 90.8 79.5 - 101.0 fL   MCH 30.4 25.1 - 34.0 pg   MCHC 33.4 31.5 - 36.0 g/dL   RBC 3.92 3.70 - 5.45 10e6/uL   RDW 13.7 11.2 -  14.5 %   lymph# 1.6 0.9 - 3.3 10e3/uL   MONO# 0.4 0.1 - 0.9 10e3/uL   Eosinophils Absolute 0.1 0.0 - 0.5 10e3/uL   Basophils Absolute 0.0 0.0 - 0.1 10e3/uL   NEUT% 52.8 38.4 - 76.8 %   LYMPH% 36.0 14.0 - 49.7 %   MONO% 8.7 0.0 - 14.0 %   EOS% 2.3 0.0 - 7.0 %   BASO% 0.2 0.0 - 2.0 %  Morphology  Result Value Ref Range   Polychromasia  Slight Slight   Helmet Cell Rare Negative   RBC Comments Rare bite cell Within Normal Limits   White Cell Comments C/W auto diff    PLT EST Decreased Adequate   Platelet Morphology occ giant platelet Within Normal Limits    Morphology not available until after visit, occasional giant platelet noted.  CBC 06-05-14 as above, plt 134K then BMET and LFTs 06-05-14 not remarkable  From EMR: platelets 2009 18k,, 2010  198k, 2011 168k, 2013 150k,   Studies/Results: Bilateral screening [place] 10-31-13   Medications: I have reviewed the patient's current medications. Will start Z pack and mucinex for upper and lower respiratory infection.  DISCUSSION: genetics counseling as above; patient is hopeful that sister will agree to testing now, as she and patient's daughter would like to know this for themselves. I have told patient that platelet count is minimally decreased, but not moreso than in August. Respiratory infection may be contributing now. Suggested that this just be followed by PCP and with visits at this office. Following visit morphology noted occasional giant platelet, above recommendations still appropriate.  Assessment/Plan:  : 1.high risk for development of breast cancer based on dense breast tissue, family history of breast cancer in premenopausal sister and multiple previous breast biopsies. Previously treated on STAR study as above. Will image with 3D/tomo mammograms due to dense tissue, due in Jan. I will see her back at least in a year, or sooner if needed. Genetics testing needs to be done on individual with breast cancer diagnosis initially, which she will discuss again with sister. 2. Minimal thrombocytopenia stable since 05-2014. Note morphology with occasional giant platelet. Not symptomatic. follow 3.persistant hot flashes in patient who is not appropriate for hormonal suppression. No worse 4. Hx ocular fungal infection and corneal implants 2010  5.  Sinusitis and bronchitis: symptoms not improved now over a week. Z pack and mucinex, push fluids. 6.Diabetes, hypothyroid, elevated lipids,.post hysterectomy 7. Essential tremor: improvement with medication changes by neurology 8.renal evaluation with [place] and CT 10-2013 with bilateral renal cysts, no renal mass  Patient knows that she can call prior to next scheduled visit if needed. Cc this note to Dr Elease Hashimoto and Dr Tat Time spent 25 min including >50% counseling and coordination of care  LIVESAY,LENNIS P, MD   09/12/2014, 11:02 AM

## 2014-09-12 NOTE — Telephone Encounter (Signed)
, °

## 2014-09-30 DIAGNOSIS — S43431D Superior glenoid labrum lesion of right shoulder, subsequent encounter: Secondary | ICD-10-CM | POA: Diagnosis not present

## 2014-09-30 DIAGNOSIS — M7501 Adhesive capsulitis of right shoulder: Secondary | ICD-10-CM | POA: Diagnosis not present

## 2014-10-09 ENCOUNTER — Other Ambulatory Visit: Payer: Self-pay | Admitting: Family Medicine

## 2014-10-09 MED ORDER — ETODOLAC 400 MG PO TABS: 400.0000 mg | ORAL_TABLET | Freq: Every day | ORAL | Status: DC

## 2014-10-09 MED ORDER — GABAPENTIN 600 MG PO TABS: 600.0000 mg | ORAL_TABLET | Freq: Two times a day (BID) | ORAL | Status: DC

## 2014-10-09 NOTE — Telephone Encounter (Signed)
Please add etodolac (LODINE) 400 MG tablet to the below re-fill request

## 2014-10-09 NOTE — Telephone Encounter (Signed)
Refills sent

## 2014-10-09 NOTE — Telephone Encounter (Signed)
[place], [place] - [address] [phone] is requesting re-fill on gabapentin (NEURONTIN) 600 MG tablet

## 2014-10-23 DIAGNOSIS — D239 Other benign neoplasm of skin, unspecified: Secondary | ICD-10-CM | POA: Diagnosis not present

## 2014-10-23 DIAGNOSIS — L821 Other seborrheic keratosis: Secondary | ICD-10-CM | POA: Diagnosis not present

## 2014-11-01 ENCOUNTER — Ambulatory Visit: Payer: Medicare Other

## 2014-11-05 ENCOUNTER — Other Ambulatory Visit: Payer: Self-pay | Admitting: *Deleted

## 2014-11-05 MED ORDER — METFORMIN HCL 500 MG PO TABS: 500.0000 mg | ORAL_TABLET | Freq: Every day | ORAL | Status: DC

## 2014-11-06 DIAGNOSIS — E780 Pure hypercholesterolemia: Secondary | ICD-10-CM | POA: Diagnosis not present

## 2014-11-06 DIAGNOSIS — E119 Type 2 diabetes mellitus without complications: Secondary | ICD-10-CM | POA: Diagnosis not present

## 2014-11-06 DIAGNOSIS — I10 Essential (primary) hypertension: Secondary | ICD-10-CM | POA: Diagnosis not present

## 2014-11-06 DIAGNOSIS — Z79899 Other long term (current) drug therapy: Secondary | ICD-10-CM | POA: Diagnosis not present

## 2014-11-07 ENCOUNTER — Ambulatory Visit
Admission: RE | Admit: 2014-11-07 | Discharge: 2014-11-07 | Disposition: A | Discharge status: Home / Self Care | Payer: Medicare Other | Source: Ambulatory Visit | Attending: Oncology | Admitting: Oncology

## 2014-11-07 DIAGNOSIS — Z1231 Encounter for screening mammogram for malignant neoplasm of breast: Secondary | ICD-10-CM | POA: Diagnosis not present

## 2014-11-26 DIAGNOSIS — L570 Actinic keratosis: Secondary | ICD-10-CM | POA: Diagnosis not present

## 2014-11-29 ENCOUNTER — Telehealth: Payer: Self-pay | Admitting: Oncology

## 2014-11-29 NOTE — Telephone Encounter (Signed)
, °

## 2014-12-03 ENCOUNTER — Other Ambulatory Visit: Payer: Self-pay | Admitting: Oncology

## 2014-12-03 ENCOUNTER — Telehealth: Payer: Self-pay | Admitting: Oncology

## 2014-12-03 ENCOUNTER — Telehealth: Payer: Self-pay | Admitting: *Deleted

## 2014-12-03 ENCOUNTER — Telehealth: Payer: Self-pay

## 2014-12-03 DIAGNOSIS — N6310 Unspecified lump in the right breast, unspecified quadrant: Secondary | ICD-10-CM

## 2014-12-03 NOTE — Telephone Encounter (Signed)
Told Ms. Nila that Dr. Marko Plume sent order for Mammogram at [facility] as  imaging is the initial step as noted below by Dr. Marko Plume. Ms. Musil is set up for mammogram on 12-10-14.  Ms. Douglass will call the [facility] daily to see if there are any cancellations prior to 12-10-14.

## 2014-12-03 NOTE — Telephone Encounter (Signed)
Laura Farrell called to say that she has discovered a lump in her medial right breast at about 3 o'clock about a week ago.  She had hoped it would resolve, but it has not.  She said the area is sore and about the size of a nickel.  She would like to have the area evaluated as soon as possible.

## 2014-12-03 NOTE — Telephone Encounter (Signed)
-----   Message from Gordy Levan, MD sent at 12/03/2014 11:42 AM EST ----- Message from RN at Dr Burchette's office re new right breast mass on self exam. Order placed now by this MD for diagnostic right mammo this week at [facility]. Please let patient know need to start with imaging. thanks

## 2014-12-03 NOTE — Telephone Encounter (Signed)
s.w. pt and advised on MM on 3.1.Marland KitchenMarland Kitchenpt ok and aware

## 2014-12-04 ENCOUNTER — Telehealth: Payer: Self-pay | Admitting: *Deleted

## 2014-12-04 ENCOUNTER — Ambulatory Visit
Admission: RE | Admit: 2014-12-04 | Discharge: 2014-12-04 | Disposition: A | Discharge status: Home / Self Care | Payer: Medicare Other | Source: Ambulatory Visit | Attending: Oncology | Admitting: Oncology

## 2014-12-04 ENCOUNTER — Other Ambulatory Visit: Payer: BLUE CROSS/BLUE SHIELD

## 2014-12-04 DIAGNOSIS — N6310 Unspecified lump in the right breast, unspecified quadrant: Secondary | ICD-10-CM

## 2014-12-04 DIAGNOSIS — R922 Inconclusive mammogram: Secondary | ICD-10-CM | POA: Diagnosis not present

## 2014-12-04 NOTE — Telephone Encounter (Signed)
[name] had her mammogram and ultrasound today and reports that they "did not find anything."  She was very relieved.  She would like to know what to do if this area continues to be sore.

## 2014-12-05 ENCOUNTER — Telehealth: Payer: Self-pay | Admitting: *Deleted

## 2014-12-05 NOTE — Telephone Encounter (Signed)
-----   Message from Gordy Levan, MD sent at 12/04/2014  3:35 PM EST ----- Triage note re mammogram/US fine and patient asking what to do for soreness "medial breast" Please tell her to take Aleve 1 tablet every 12 hours with food for ~ 3 days to see if this improves it.  When RN speaks with her, ask if the sore area is at medial aspect of rib/ just lateral to sternum, as I think this could be costochondritis If the Aleve is not helpful, she can let PCP or this office know.  thanks

## 2014-12-05 NOTE — Telephone Encounter (Signed)
Called patient as noted below by Dr. Marko Plume.  She states the sore area is just lateral to her sternum. She is agreeable to take Aleve as noted below. Told patient if Aleve is not helpful to call back here or to her PCP. Patient states she will call back here on Monday if the soreness is not improved since Dr. Marko Plume is already aware of the situation.

## 2014-12-10 ENCOUNTER — Other Ambulatory Visit: Payer: BLUE CROSS/BLUE SHIELD

## 2014-12-24 ENCOUNTER — Other Ambulatory Visit: Payer: Self-pay | Admitting: Physician Assistant

## 2014-12-24 DIAGNOSIS — L570 Actinic keratosis: Secondary | ICD-10-CM | POA: Diagnosis not present

## 2014-12-24 DIAGNOSIS — L821 Other seborrheic keratosis: Secondary | ICD-10-CM | POA: Diagnosis not present

## 2014-12-24 DIAGNOSIS — D485 Neoplasm of uncertain behavior of skin: Secondary | ICD-10-CM | POA: Diagnosis not present

## 2014-12-24 DIAGNOSIS — D239 Other benign neoplasm of skin, unspecified: Secondary | ICD-10-CM | POA: Diagnosis not present

## 2015-01-10 ENCOUNTER — Other Ambulatory Visit: Payer: Self-pay | Admitting: Neurology

## 2015-01-10 MED ORDER — PRIMIDONE 50 MG PO TABS: ORAL_TABLET | ORAL | Status: DC

## 2015-01-10 NOTE — Telephone Encounter (Signed)
Received refill request for Primidone, Topamax and Effexor. Primidone RX refilled. Topamax denied - patient to have stopped this medication. Effexor denied - PCP to fill this medication.

## 2015-01-16 DIAGNOSIS — L570 Actinic keratosis: Secondary | ICD-10-CM | POA: Diagnosis not present

## 2015-01-16 DIAGNOSIS — X32XXXD Exposure to sunlight, subsequent encounter: Secondary | ICD-10-CM | POA: Diagnosis not present

## 2015-01-16 DIAGNOSIS — L820 Inflamed seborrheic keratosis: Secondary | ICD-10-CM | POA: Diagnosis not present

## 2015-02-03 DIAGNOSIS — T85398D Other mechanical complication of other ocular prosthetic devices, implants and grafts, subsequent encounter: Secondary | ICD-10-CM | POA: Diagnosis not present

## 2015-02-03 DIAGNOSIS — H2511 Age-related nuclear cataract, right eye: Secondary | ICD-10-CM | POA: Diagnosis not present

## 2015-02-05 ENCOUNTER — Ambulatory Visit (INDEPENDENT_AMBULATORY_CARE_PROVIDER_SITE_OTHER): Payer: Medicare Other | Admitting: Neurology

## 2015-02-05 ENCOUNTER — Telehealth: Payer: Self-pay

## 2015-02-05 ENCOUNTER — Encounter: Payer: Self-pay | Admitting: Neurology

## 2015-02-05 VITALS — BP 110/82 | HR 72 | Ht 64.0 in | Wt 170.0 lb

## 2015-02-05 DIAGNOSIS — E039 Hypothyroidism, unspecified: Secondary | ICD-10-CM | POA: Diagnosis not present

## 2015-02-05 DIAGNOSIS — N951 Menopausal and female climacteric states: Secondary | ICD-10-CM

## 2015-02-05 DIAGNOSIS — Z79899 Other long term (current) drug therapy: Secondary | ICD-10-CM | POA: Diagnosis not present

## 2015-02-05 DIAGNOSIS — I10 Essential (primary) hypertension: Secondary | ICD-10-CM | POA: Diagnosis not present

## 2015-02-05 DIAGNOSIS — E780 Pure hypercholesterolemia: Secondary | ICD-10-CM | POA: Diagnosis not present

## 2015-02-05 DIAGNOSIS — G250 Essential tremor: Secondary | ICD-10-CM | POA: Diagnosis not present

## 2015-02-05 DIAGNOSIS — K219 Gastro-esophageal reflux disease without esophagitis: Secondary | ICD-10-CM | POA: Diagnosis not present

## 2015-02-05 DIAGNOSIS — E119 Type 2 diabetes mellitus without complications: Secondary | ICD-10-CM | POA: Diagnosis not present

## 2015-02-05 NOTE — Telephone Encounter (Signed)
-----   Message from [name], MD sent at 02/05/2015  1:02 PM EDT ----- Hassan Rowan,  I want to get this pt back in to discuss possible medication change off Effexor. ----- Message -----    From: Eustace Quail Tat, DO    Sent: 02/05/2015  10:04 AM      To: [name], MD, Gordy Levan, MD  Hello!  Sorry to bother you!  I know I have emailed you, Bruce, in the past about this patient.  Basically, she has been my patient a while now and has intermittently, and persistently, c/o tremor but I have never seen any significant tremor.  She likely has mild ET.  Anyway, sx's were fairly well controlled the last few visits, but she wanted to go back on effexor for her hot flashes.  She had d/c it previously because it was thought that it may have contributed to tremor.  When she restarted it, she c/o tremor again (but I still don't see much).  She asks me what she can do.  I have nothing to offer that she hasn't already tried for her tremor.  Can effexor be changed?  She is also on neurontin 600 mg bid for the hot flashes but alone, that didn't take them away.  Thank you for your help!  Wells Guiles

## 2015-02-05 NOTE — Telephone Encounter (Signed)
Pt has been scheduled.  °
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
## 2015-02-05 NOTE — Telephone Encounter (Signed)
Can you please call and set up appt for patient.

## 2015-02-10 ENCOUNTER — Telehealth: Payer: Self-pay

## 2015-02-10 MED ORDER — LEVOTHYROXINE SODIUM 75 MCG PO TABS: 75.0000 ug | ORAL_TABLET | Freq: Every day | ORAL | Status: DC

## 2015-02-10 MED ORDER — METFORMIN HCL 500 MG PO TABS: 500.0000 mg | ORAL_TABLET | Freq: Every day | ORAL | Status: DC

## 2015-02-10 NOTE — Telephone Encounter (Signed)
[facility] request for metFORMIN (GLUCOPHAGE) 500 MG tablet and levothyroxine (SYNTHROID, LEVOTHROID) 75 MCG tablet. Appointment scheduled for 02/12/15

## 2015-02-10 NOTE — Telephone Encounter (Signed)
Rx sent to pharmacy   

## 2015-02-12 ENCOUNTER — Encounter: Payer: Self-pay | Admitting: Family Medicine

## 2015-02-12 ENCOUNTER — Ambulatory Visit (INDEPENDENT_AMBULATORY_CARE_PROVIDER_SITE_OTHER): Payer: Medicare Other | Admitting: Family Medicine

## 2015-02-12 ENCOUNTER — Telehealth: Payer: Self-pay

## 2015-02-12 VITALS — BP 110/70 | HR 62 | Temp 98.4°F | Wt 172.0 lb

## 2015-02-12 DIAGNOSIS — R232 Flushing: Secondary | ICD-10-CM

## 2015-02-12 DIAGNOSIS — E119 Type 2 diabetes mellitus without complications: Secondary | ICD-10-CM | POA: Insufficient documentation

## 2015-02-12 DIAGNOSIS — N951 Menopausal and female climacteric states: Secondary | ICD-10-CM

## 2015-02-12 DIAGNOSIS — E038 Other specified hypothyroidism: Secondary | ICD-10-CM | POA: Diagnosis not present

## 2015-02-12 LAB — HEMOGLOBIN A1C: HEMOGLOBIN A1C: 6.2 % (ref 4.6–6.5)

## 2015-02-12 LAB — TSH: TSH: 0.92 u[IU]/mL (ref 0.35–4.50)

## 2015-02-12 MED ORDER — CITALOPRAM HYDROBROMIDE 10 MG PO TABS: 10.0000 mg | ORAL_TABLET | Freq: Every day | ORAL | Status: DC

## 2015-02-12 MED ORDER — ETODOLAC 400 MG PO TABS: 400.0000 mg | ORAL_TABLET | Freq: Every day | ORAL | Status: DC

## 2015-02-12 MED ORDER — GABAPENTIN 600 MG PO TABS: 600.0000 mg | ORAL_TABLET | Freq: Two times a day (BID) | ORAL | Status: DC

## 2015-02-12 NOTE — Progress Notes (Signed)
Pre visit review using our clinic review tool, if applicable. No additional management support is needed unless otherwise documented below in the visit note. 

## 2015-02-12 NOTE — Telephone Encounter (Signed)
Rx's sent to pharmacy.  

## 2015-02-12 NOTE — Progress Notes (Signed)
   Subjective:    Patient ID: Laura Farrell, female    DOB: [birth year], 69 y.o.   MRN: [id number]  HPI Seen for the following  History of essential tremor. Treated with Mysoline and propranolol and stable. She has history of hot flashes and was placed by gynecologist on gabapentin and Effexor. These have controlled her hot flashes but have made her tremor worse. She has noticed after stopping Effexor in the past that her tremor improves. She cannot take estrogen.  Hypothyroidism. Patient compliant with thyroid medication. Needs follow-up labs.  History of type 2 diabetes has been diet controlled. Her weight is back up somewhat today. Currently no medications. No polyuria or polydipsia. Last A1c 5.9%.  Past Medical History  Diagnosis Date  . Hot flashes   . Over weight   . HLP (hyperkeratosis lenticularis perstans)   . Diabetes mellitus   . Headache(784.0)   . Hyperlipidemia   . Thyroid disease   . Urine incontinence   . Nephrolithiasis    Past Surgical History  Procedure Laterality Date  . Abdominal hysterectomy    . Left shoulder    . Lower back surgery    . Cornea implant    . Tubal ligation  1980  . Vesicovaginal fistula closure w/ tah  1995  . Spine surgery  2005  . Appendectomy      reports that she has never smoked. She has never used smokeless tobacco. She reports that she does not drink alcohol or use illicit drugs. family history includes Cancer in her father; Pancreatic cancer in her mother; Tremor in her brother, father, mother, and sister. No Known Allergies    Review of Systems  Constitutional: Negative for fatigue.  Eyes: Negative for visual disturbance.  Respiratory: Negative for cough, chest tightness, shortness of breath and wheezing.   Cardiovascular: Negative for chest pain, palpitations and leg swelling.  Neurological: Negative for dizziness, seizures, syncope, weakness, light-headedness and headaches.       Objective:   Physical Exam    Constitutional: She is oriented to person, place, and time. She appears well-developed and well-nourished. No distress.  Neck: Neck supple. No thyromegaly present.  Cardiovascular: Normal rate and regular rhythm.   Pulmonary/Chest: Effort normal and breath sounds normal. No respiratory distress. She has no wheezes. She has no rales.  Musculoskeletal: She exhibits no edema.  Neurological: She is alert and oriented to person, place, and time. No cranial nerve deficit.  No tremor noted on exam today          Assessment & Plan:  #1 postmenopausal hot flashes. Patient is concerned about tremor issues with Effexor. She has not had adequate control hot flashes with gabapentin alone. Reluctant to try clonidine because of her already fairly low blood pressure and also increased risk for sedation and other side effects. We explained that SSRI such as citalopram could potentially also cause tremor but we will try discontinuing Effexor and try low-dose citalopram 10 mg once daily. Reassess one month #2 hypothyroidism. Recheck TSH #3 type 2 diabetes which is been diet controlled. Recheck A1c. Encouraged to lose some weight

## 2015-02-12 NOTE — Telephone Encounter (Signed)
[facility] refill request for etodolac (LODINE) 400 MG tablet and gabapentin (NEURONTIN) 600 MG tablet

## 2015-02-12 NOTE — Patient Instructions (Signed)
Decrease Effexor to one daily for 3-4 days and then discontinue- May then start the Citalopram one daily.

## 2015-03-07 ENCOUNTER — Ambulatory Visit: Payer: Medicare Other | Admitting: Family Medicine

## 2015-03-12 ENCOUNTER — Ambulatory Visit (INDEPENDENT_AMBULATORY_CARE_PROVIDER_SITE_OTHER): Payer: Medicare Other | Admitting: Family Medicine

## 2015-03-12 ENCOUNTER — Encounter: Payer: Self-pay | Admitting: Family Medicine

## 2015-03-12 VITALS — BP 112/68 | HR 68 | Temp 98.4°F | Wt 173.0 lb

## 2015-03-12 DIAGNOSIS — N951 Menopausal and female climacteric states: Secondary | ICD-10-CM | POA: Diagnosis not present

## 2015-03-12 DIAGNOSIS — R232 Flushing: Secondary | ICD-10-CM | POA: Insufficient documentation

## 2015-03-12 MED ORDER — CITALOPRAM HYDROBROMIDE 20 MG PO TABS: 20.0000 mg | ORAL_TABLET | Freq: Every day | ORAL | Status: DC

## 2015-03-12 NOTE — Progress Notes (Signed)
Pre visit review using our clinic review tool, if applicable. No additional management support is needed unless otherwise documented below in the visit note. 

## 2015-03-12 NOTE — Progress Notes (Signed)
   Subjective:    Patient ID: Laura Farrell, female    DOB: May 26, 1946, 69 y.o.   MRN: [id number]  HPI  patient seen for follow-up regarding postmenopausal hot flashes. She has history of essential tremor  And has been on gabapentin for several years. We recently discontinued Effexor and she has seen improvement in her hot flashes which are much better. Unfortunately she has not seen any improvement thus far and hot flashes with low-dose citalopram 10 mg daily. We were reluctant to use clonidine because of her history of relatively low blood pressure. She cannot take estrogen.  Past Medical History  Diagnosis Date  . Hot flashes   . Over weight   . HLP (hyperkeratosis lenticularis perstans)   . Diabetes mellitus   . Headache(784.0)   . Hyperlipidemia   . Thyroid disease   . Urine incontinence   . Nephrolithiasis    Past Surgical History  Procedure Laterality Date  . Abdominal hysterectomy    . Left shoulder    . Lower back surgery    . Cornea implant    . Tubal ligation  1980  . Vesicovaginal fistula closure w/ tah  1995  . Spine surgery  2005  . Appendectomy      reports that she has never smoked. She has never used smokeless tobacco. She reports that she does not drink alcohol or use illicit drugs. family history includes Cancer in her father; Pancreatic cancer in her mother; Tremor in her brother, father, mother, and sister. No Known Allergies'    Review of Systems  Constitutional: Negative for appetite change and unexpected weight change.  Respiratory: Negative for cough and shortness of breath.   Cardiovascular: Negative for chest pain.       Objective:   Physical Exam  Constitutional: She appears well-developed and well-nourished.  Neck: Neck supple. No thyromegaly present.  Cardiovascular: Normal rate and regular rhythm.   Pulmonary/Chest: Effort normal and breath sounds normal. No respiratory distress. She has no wheezes. She has no rales.  Musculoskeletal:  She exhibits no edema.  Lymphadenopathy:    She has no cervical adenopathy.          Assessment & Plan:   Postmenopausal hot flashes. Not improved on low-dose citalopram. Her tremors are improved after stopping Effexor. Increase citalopram to 20 mg daily. Touch base one month if not further improved

## 2015-03-12 NOTE — Patient Instructions (Signed)
Go ahead and increase the Citalopram to 20 mg once daily.   Let me know in one month if not improved.

## 2015-03-14 DIAGNOSIS — K210 Gastro-esophageal reflux disease with esophagitis: Secondary | ICD-10-CM | POA: Diagnosis not present

## 2015-03-14 DIAGNOSIS — K625 Hemorrhage of anus and rectum: Secondary | ICD-10-CM | POA: Diagnosis not present

## 2015-03-14 DIAGNOSIS — Z79899 Other long term (current) drug therapy: Secondary | ICD-10-CM | POA: Diagnosis not present

## 2015-03-14 DIAGNOSIS — K602 Anal fissure, unspecified: Secondary | ICD-10-CM | POA: Diagnosis not present

## 2015-03-14 DIAGNOSIS — K5900 Constipation, unspecified: Secondary | ICD-10-CM | POA: Diagnosis not present

## 2015-04-09 ENCOUNTER — Other Ambulatory Visit: Payer: Self-pay | Admitting: Neurology

## 2015-04-09 NOTE — Telephone Encounter (Signed)
Topamax refill denied. Per last office note patient is off medication.

## 2015-04-16 ENCOUNTER — Other Ambulatory Visit: Payer: Self-pay | Admitting: Gastroenterology

## 2015-04-16 DIAGNOSIS — D122 Benign neoplasm of ascending colon: Secondary | ICD-10-CM | POA: Diagnosis not present

## 2015-04-16 DIAGNOSIS — K210 Gastro-esophageal reflux disease with esophagitis: Secondary | ICD-10-CM | POA: Diagnosis not present

## 2015-04-16 DIAGNOSIS — K625 Hemorrhage of anus and rectum: Secondary | ICD-10-CM | POA: Diagnosis not present

## 2015-04-16 DIAGNOSIS — D126 Benign neoplasm of colon, unspecified: Secondary | ICD-10-CM | POA: Diagnosis not present

## 2015-04-16 LAB — HM COLONOSCOPY

## 2015-04-22 ENCOUNTER — Ambulatory Visit: Payer: Medicare Other | Admitting: Adult Health

## 2015-05-14 DIAGNOSIS — K625 Hemorrhage of anus and rectum: Secondary | ICD-10-CM | POA: Diagnosis not present

## 2015-05-14 DIAGNOSIS — R739 Hyperglycemia, unspecified: Secondary | ICD-10-CM | POA: Diagnosis not present

## 2015-05-14 DIAGNOSIS — I10 Essential (primary) hypertension: Secondary | ICD-10-CM | POA: Diagnosis not present

## 2015-05-14 DIAGNOSIS — E780 Pure hypercholesterolemia: Secondary | ICD-10-CM | POA: Diagnosis not present

## 2015-05-14 DIAGNOSIS — R209 Unspecified disturbances of skin sensation: Secondary | ICD-10-CM | POA: Diagnosis not present

## 2015-05-14 DIAGNOSIS — R5383 Other fatigue: Secondary | ICD-10-CM | POA: Diagnosis not present

## 2015-05-14 DIAGNOSIS — R413 Other amnesia: Secondary | ICD-10-CM | POA: Diagnosis not present

## 2015-05-20 DIAGNOSIS — K210 Gastro-esophageal reflux disease with esophagitis: Secondary | ICD-10-CM | POA: Diagnosis not present

## 2015-05-20 DIAGNOSIS — L290 Pruritus ani: Secondary | ICD-10-CM | POA: Diagnosis not present

## 2015-05-20 DIAGNOSIS — K625 Hemorrhage of anus and rectum: Secondary | ICD-10-CM | POA: Diagnosis not present

## 2015-05-20 DIAGNOSIS — D369 Benign neoplasm, unspecified site: Secondary | ICD-10-CM | POA: Diagnosis not present

## 2015-05-21 ENCOUNTER — Other Ambulatory Visit: Payer: Self-pay | Admitting: Family Medicine

## 2015-05-21 DIAGNOSIS — R413 Other amnesia: Secondary | ICD-10-CM

## 2015-05-27 ENCOUNTER — Telehealth: Payer: Self-pay | Admitting: Family Medicine
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
## 2015-05-27 NOTE — Telephone Encounter (Signed)
Dr Maudie Mercury saw her for those visits.
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
## 2015-05-29 ENCOUNTER — Encounter: Payer: Self-pay | Admitting: *Deleted
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
## 2015-06-10 ENCOUNTER — Ambulatory Visit
Admission: RE | Admit: 2015-06-10 | Discharge: 2015-06-10 | Disposition: A | Discharge status: Home / Self Care | Payer: Medicare Other | Source: Ambulatory Visit | Attending: Family Medicine | Admitting: Family Medicine

## 2015-06-10 DIAGNOSIS — R413 Other amnesia: Secondary | ICD-10-CM

## 2015-06-10 MED ORDER — GADOBENATE DIMEGLUMINE 529 MG/ML IV SOLN
15.0000 mL | Freq: Once | INTRAVENOUS | Status: AC | PRN
  Administered 2015-06-10: 15 mL via INTRAVENOUS

## 2015-07-10 DIAGNOSIS — M7541 Impingement syndrome of right shoulder: Secondary | ICD-10-CM | POA: Diagnosis not present

## 2015-07-10 DIAGNOSIS — S43431D Superior glenoid labrum lesion of right shoulder, subsequent encounter: Secondary | ICD-10-CM | POA: Diagnosis not present

## 2015-07-10 DIAGNOSIS — M7551 Bursitis of right shoulder: Secondary | ICD-10-CM | POA: Diagnosis not present

## 2015-07-11 DIAGNOSIS — M25511 Pain in right shoulder: Secondary | ICD-10-CM | POA: Diagnosis not present

## 2015-07-15 DIAGNOSIS — M25511 Pain in right shoulder: Secondary | ICD-10-CM | POA: Diagnosis not present

## 2015-07-16 DIAGNOSIS — T85398D Other mechanical complication of other ocular prosthetic devices, implants and grafts, subsequent encounter: Secondary | ICD-10-CM | POA: Diagnosis not present

## 2015-07-16 DIAGNOSIS — H2511 Age-related nuclear cataract, right eye: Secondary | ICD-10-CM | POA: Diagnosis not present

## 2015-07-16 DIAGNOSIS — Z961 Presence of intraocular lens: Secondary | ICD-10-CM | POA: Insufficient documentation

## 2015-07-17 DIAGNOSIS — M25511 Pain in right shoulder: Secondary | ICD-10-CM | POA: Diagnosis not present

## 2015-07-21 DIAGNOSIS — M25511 Pain in right shoulder: Secondary | ICD-10-CM | POA: Diagnosis not present

## 2015-07-24 DIAGNOSIS — M25511 Pain in right shoulder: Secondary | ICD-10-CM | POA: Diagnosis not present

## 2015-07-28 ENCOUNTER — Other Ambulatory Visit: Payer: Self-pay | Admitting: Neurology

## 2015-07-28 MED ORDER — PRIMIDONE 50 MG PO TABS: ORAL_TABLET | ORAL | Status: DC

## 2015-07-28 NOTE — Telephone Encounter (Signed)
Primidone refill requested. Per last office note- patient to remain on medication. Refill approved and sent to patient's pharmacy.   

## 2015-07-29 DIAGNOSIS — M25511 Pain in right shoulder: Secondary | ICD-10-CM | POA: Diagnosis not present

## 2015-07-31 DIAGNOSIS — M25511 Pain in right shoulder: Secondary | ICD-10-CM | POA: Diagnosis not present

## 2015-08-05 DIAGNOSIS — M25511 Pain in right shoulder: Secondary | ICD-10-CM | POA: Diagnosis not present

## 2015-08-06 ENCOUNTER — Ambulatory Visit: Payer: Medicare Other | Attending: Psychology | Admitting: Psychology

## 2015-08-06 DIAGNOSIS — R413 Other amnesia: Secondary | ICD-10-CM | POA: Diagnosis not present

## 2015-08-06 NOTE — Progress Notes (Signed)
[facility]  [address]   Telephone [phone] [address] Fax [phone] [place], [place] [address]  Initial Contact Note  Name:  ADRYAN DRUCKENMILLER Date of Birth; 12/11/1945 MRN:  [id number] Date:  08/06/2015  SEE BEHARRY is an 69 y.o. female who was referred for neuropsychological evaluation Aura Dials, MD due to her complaints of memory loss.    A total of 4 hours was spent today reviewing medical records, interviewing (CPT [phone]) Valli Glance and administering and scoring neurocognitive tests (CPT [phone] & [phone]).  Preliminary Diagnostic Impression: Complaints of memory disturbance [R41.3]  There were no concerns expressed or behaviors displayed by Valli Glance that would require immediate attention.   A full report will follow once the planned testing has been completed. Her next appointment is scheduled for 08/15/15.   Jamey Ripa, Ph.D Licensed Psychologist 08/06/2015

## 2015-08-08 DIAGNOSIS — M25511 Pain in right shoulder: Secondary | ICD-10-CM | POA: Diagnosis not present

## 2015-08-11 DIAGNOSIS — M7501 Adhesive capsulitis of right shoulder: Secondary | ICD-10-CM | POA: Diagnosis not present

## 2015-08-11 DIAGNOSIS — M75111 Incomplete rotator cuff tear or rupture of right shoulder, not specified as traumatic: Secondary | ICD-10-CM | POA: Diagnosis not present

## 2015-08-11 DIAGNOSIS — S43431D Superior glenoid labrum lesion of right shoulder, subsequent encounter: Secondary | ICD-10-CM | POA: Diagnosis not present

## 2015-08-11 DIAGNOSIS — M7551 Bursitis of right shoulder: Secondary | ICD-10-CM | POA: Diagnosis not present

## 2015-08-13 ENCOUNTER — Ambulatory Visit (INDEPENDENT_AMBULATORY_CARE_PROVIDER_SITE_OTHER): Payer: Medicare Other | Admitting: Neurology

## 2015-08-13 ENCOUNTER — Other Ambulatory Visit (INDEPENDENT_AMBULATORY_CARE_PROVIDER_SITE_OTHER): Payer: Medicare Other

## 2015-08-13 ENCOUNTER — Encounter: Payer: Self-pay | Admitting: Neurology

## 2015-08-13 VITALS — BP 108/64 | HR 68 | Ht 64.0 in | Wt 172.0 lb

## 2015-08-13 DIAGNOSIS — G250 Essential tremor: Secondary | ICD-10-CM

## 2015-08-13 DIAGNOSIS — N951 Menopausal and female climacteric states: Secondary | ICD-10-CM

## 2015-08-13 DIAGNOSIS — R232 Flushing: Secondary | ICD-10-CM

## 2015-08-13 LAB — COMPREHENSIVE METABOLIC PANEL
ALT: 16 U/L (ref 0–35)
AST: 14 U/L (ref 0–37)
Albumin: 4 g/dL (ref 3.5–5.2)
Alkaline Phosphatase: 53 U/L (ref 39–117)
BUN: 24 mg/dL — AB (ref 6–23)
CHLORIDE: 109 meq/L (ref 96–112)
CO2: 25 meq/L (ref 19–32)
CREATININE: 1.11 mg/dL (ref 0.40–1.20)
Calcium: 9.4 mg/dL (ref 8.4–10.5)
GFR: 51.4 mL/min — ABNORMAL LOW (ref >60.00)
GLUCOSE: 119 mg/dL — AB (ref 70–99)
POTASSIUM: 3.9 meq/L (ref 3.5–5.1)
SODIUM: 141 meq/L (ref 135–145)
Total Bilirubin: 0.5 mg/dL (ref 0.2–1.2)
Total Protein: 6.9 g/dL (ref 6.0–8.3)

## 2015-08-13 LAB — CBC WITH DIFFERENTIAL/PLATELET
BASOS PCT: 0.4 % (ref 0.0–3.0)
Basophils Absolute: 0 10*3/uL (ref 0.0–0.1)
EOS ABS: 0.1 10*3/uL (ref 0.0–0.7)
EOS PCT: 1.9 % (ref 0.0–5.0)
HCT: 38.7 % (ref 36.0–46.0)
HEMOGLOBIN: 12.8 g/dL (ref 12.0–15.0)
LYMPHS ABS: 2 10*3/uL (ref 0.7–4.0)
Lymphocytes Relative: 39.9 % (ref 12.0–46.0)
MCHC: 33 g/dL (ref 30.0–36.0)
MCV: 93.7 fl (ref 78.0–100.0)
MONO ABS: 0.3 10*3/uL (ref 0.1–1.0)
Monocytes Relative: 5.4 % (ref 3.0–12.0)
NEUTROS ABS: 2.7 10*3/uL (ref 1.4–7.7)
NEUTROS PCT: 52.4 % (ref 43.0–77.0)
PLATELETS: 168 10*3/uL (ref 150.0–400.0)
RBC: 4.13 Mil/uL (ref 3.87–5.11)
RDW: 14.2 % (ref 11.5–15.5)
WBC: 5.1 10*3/uL (ref 4.0–10.5)

## 2015-08-13 MED ORDER — PROPRANOLOL HCL 20 MG PO TABS: 20.0000 mg | ORAL_TABLET | Freq: Two times a day (BID) | ORAL | Status: DC

## 2015-08-13 MED ORDER — PRIMIDONE 50 MG PO TABS: ORAL_TABLET | ORAL | Status: DC

## 2015-08-13 NOTE — Addendum Note (Signed)
Addended byAnnamaria Helling on: 08/13/2015 10:48 AM   Modules accepted: Orders
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
## 2015-08-13 NOTE — Patient Instructions (Addendum)
1.  Decrease topamax to 1/2 tablet at night (50 mg) for a week and the stop the medication 2.  I have refilled your propranolol and primidone 3. Your provider has requested that you have labwork completed today. Please go to [facility] on the second floor of this building before leaving the office today. You do not need to check in. If you are not called within 15 minutes please check with the front desk.

## 2015-08-15 ENCOUNTER — Ambulatory Visit: Payer: Medicare Other | Attending: Psychology | Admitting: Psychology

## 2015-08-15 DIAGNOSIS — R413 Other amnesia: Secondary | ICD-10-CM

## 2015-08-15 NOTE — Progress Notes (Signed)
[name]   Telephone [phone] [address] Fax [phone] [place], [place] [address]   [place]* This report should not be released without the consent of the client  Name:   Laura Farrell. Zaugg  Date of Birth:  04/03/2046 [facility] MR#:  [id number] Dates of Evaluation: 08/06/15 & 08/15/15  Reason for Referral Laura Farrell is a 69 year-old, right-handed woman who was referred for neuropsychological evaluation by her primary care physician, Aura Dials MD, as prompted by report of progressive memory loss. A brain MRI on 06/10/15 showed an incidental and stable appearing venous angioma in the right posterior frontal lobe, mild cerebral and cerebellar atrophy and mild subcortical and periventricular hyperintensities presumed due to chronic microvascular ischemic change. No acute intracranial abnormalities were noted.  Sources of Information Notes from Dr. Sheryn Bison as well as electronic medical records from the [place] were reviewed. [name] was interviewed.    Chief Complaints [name] reported an approximate two year history of gradually worsening memory difficulties exemplified by losing or misplacing items and forgetting to do something as planned. She would not consider herself to be inattentive, easily distracted or mentally preoccupied. She has not been told by anyone that she has displayed any change in her memory. She described herself as able to complete her basic and instrumental activities of daily living independently.   With regards to her physical and somatic functioning, she reported a history of tremor affecting both hands for more than twenty years that had lessened somewhat a few months ago after she was switched from venlafaxine to citalopram (used to treat postmenopausal hot flashes). She reported mildly reduced hearing while in noisy settings over the past few months. She  complained of mild right shoulder pain. She reported rare occurrence of headache within the past year. She did not cite any problems with balance, gait, sleep, vision, speech, swallowing or appetite.  She described her current mood as mostly good. She cited an absence of life stress. She specifically denied experiencing sad mood, apathy, loss of interests, mood instability, anxiety, suicidal thoughts, hallucinations or delusions.   Background Her past medical history was notable for diabetes, essential tremor (hands), gastroesophageal reflux disease, hyperlipidemia, migraine headache, obstructive sleep apnea (diagnosed in 2013 as mild, no airway device needed), postmenopausal hot flashes and thyroid disease.    She denied history of head injury, loss of consciousness, seizure activity, neurological infection or exposure to neurotoxic substances. She reported occasional use of alcohol. She reported no use of illicit drugs or tobacco products.  She reported no history of emotional difficulties or mental health contacts. She reported that she has been prescribed antidepressant medication only for menopausal hot flashes.   She reported no family history of memory disorder or dementia.  Her current medications include aspirin, atorvastatin, citalopram (for hot flashes), fenofibrate, gabapentin (for hot flashes), levothyroxine, metformin, omeprazole, primidone, propranolol and topiramate. She reported that she took tamoxifen from 2007 until 2012 as part of a research study investigating the use of medication to prevent breast cancer.   She lives with her husband of 48 years. Her first marriage ended in divorce after eighteen years. She does not have any children.   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    She was graduated from high school and attended two years of college. She stated that she dropped out of college after she got  a  fulltime job. She denied history of school-based attentional or learning problems.  Observations She appeared as an appropriately groomed and dressed woman in no apparent distress. She interacted in a pleasant and cooperative manner. She spoke in a normal tone of voice, maintained good eye contact and responded to all questions. Her affect was predominantly bright. She did not display signs of emotional distress. She seemed to be a reliable informant. Her thought processes were coherent and  organized without loose associations, verbal perseverations or flight of ideas. Her thought content was devoid of unusual or bizarre ideas.  Evaluation Procedures Animal Naming Test  [place]lin Naming Test Controlled Oral Word Association Test Geriatric Depression Scale (short form) Rey Complex Figure: Copy Trail Making A & B Wechsler Adult Intelligence Scale-IV:   Music therapist, Digit Span, Matrix Reasoning, Similarities & Symbol Search Wechsler Memory Scale-IV: Older Adult Battery Wide Range Achievement Test-4: Word Reading [place] Card Sorting Test  Assessment Results Validity & Interpretative Considerations  Test results were deemed to represent a valid measure of her current cognitive functioning. She presented as alert, calm and cooperative. She did not report or display problems with vision (she wore her eyeglasses) or hearing. She displayed subtle hand tremors that did not appear to disrupt her ability to manipulate test materials or draw. She had no difficulty understanding test directions. There were no obvious signs of inadequate effort.    Her baseline intellectual potential was estimated to fall within the Average range based on demographic factors coupled with a measure of word reading (Wide Range Achievement Test-4) that represents an over-learned verbal skill relatively resistant to the effects of neurological disorder or injury.  Her test scores were corrected to reflect norms for her age  and, whenever possible, her gender and educational level (i.e., 14 years). A listing of test results can be found at the end of this report.  Attentional & Executive Functions Her performances on a timed attentional tests that measured her speed to draw lines connecting numbers randomly arrayed on a page in numerical sequence (Trials A) or to discriminate similarities and differences amongst sets of geometric symbols (WAIS-IV Symbol Search) fell within the Average range.  Her attentional capacity to hold and manipulate information in temporary memory was as expected based on her abilities to mentally rearrange digit sequences in reverse or in ascending numerical order (WAIS-IV Digit Span) or immediately recognize symbols in left to right order (Wechsler Memory Scale-IV (WMS-IV) Symbol Span).  She performed within normal expectations on measures of cognitive flexibility. She completed an ascending and alternating visual sequence of numbers and letters (Trails B) within a faster than average amount of time. Her performances on measures of fluent verbal productivity were within the Average range based on her abilities to generate words to designated letters (Controlled Oral Word Association Test) or name members of a category Health and safety inspector Naming Test).  Her performances on tests of conceptual reasoning that required classification of ostensibly different objects or ideas to a shared category (WAIS-IV Similarities) or identification of the missing element to complete an abstract sequence or matrix of designs (WAIS-IV Matrix Reasoning) fell within the Average range.  She performed well on a test of problem-solving that required inferring logical organizing principles and systematically applying these ideas to sort geometric designs [employer]). She made a lower than normal number of errors, completed all categories, maintained set and demonstrated conceptual flexibility.   Learning & Memory On  the WMS-IV, her Immediate Memory Index (IMI), a measure  of her ability to recall verbal and visual information immediately after the stimuli was presented, fell within the High Average range. Her Delayed Memory Index (DMI), a combined measure of her ability to recall verbal and visual information after a 20 to 30 minute delay, fell within the Average range. Her DMI was lower than expected given her IMI, which was specifically due to lower than expected spontaneous recall of figural designs after the delay. Her ability to recognize these designs from multiple choices was above normal, which indicated a mild difficulty for retrieving visual information from memory.  Language Her ability to name drawings of common objects to confrontation [facility] [employer]) was somewhat lower than expected within the Low Average range.  Most of her errors were within the same semantic category (e.g., "autoharp" for harmonica, "dice" for dominoes, "rope" for noose). As noted above, measures of phonemic and category fluency were within the Average range. Her word reading skill (Wide Range Achievement Test-4) fell within the Average range.  Visual-Spatial Organization & Visual-Construction  There were no signs of spatial inattention or problems with visual recognition. Her ability to assemble two-dimensional block designs from models Product manager) was within the Average range. Her copy of a spatially-complex geometric design (Rey Complex Figure) was normal despite apparently reduced perceptual-motor control (possibly due to tremor).  Emotional Status On the Geriatric Depression Scale (short form), her score of 1/15 did not suggest depression. The only symptom that she endorsed was memory difficulties.     Summary & Conclusions Laura Farrell is a 69 year-old woman who reported an approximate two year history of gradually worsening memory difficulties exemplified by losing or misplacing items and forgetting to do  something as planned. She described herself as able to complete her basic and instrumental activities of daily living without difficulty. She did not report symptoms of mood disturbance or difficulties with psychosocial adjustment.   Her neuropsychological test performances were predominantly within normal expectations. The only anomalies within her neuropsychological profile, which were not deemed to be clinically significant, were a below average score on a measure of confrontational naming and mild difficulty retrieving information from visual memory after a delay. In the final analysis, neuropsychological evaluation would not suggest a cognitive disorder.  Diagnostic Impression Complaints of memory disturbance with normal neuropsychological exam [R41.3]  Recommendations 1. Since topiramate has been known to reduce cognitive efficiency in some persons, if feasible consider either decreasing her dosage or discontinuing this medication to ascertain whether her subjective cognitive functioning might be improved.    2. The current test results comprise a baseline of her cognitive functioning. If she or someone who knows her well should perceive a substantial change in her cognitive functioning in the future, then a repeat neuropsychological evaluation could be considered.   The results and recommendations from this evaluation were discussed with her on 08/15/15. She seemed relieved after being reassured that her cognitive functioning is normal.   I have appreciated the opportunity to evaluate Ms. Cuthrell. Please feel free to contact me with any comments or questions.     ______________________ Jamey Ripa, Ph.D Licensed Psychologist     ADDENDUM-NEUROPSYCHOLOGICAL TEST RESULTS  Animal Naming Test Score= 18 45th (adjusted for age, gender and educational level)    [place] Naming Test Score= [phone]  13th (adjusted for age, gender and educational level)    Controlled Oral Word  Association Test Score=  34 words/0 repetitions 32nd  (adjusted for age, gender and educational level)    Rey  Complex Figure: copy       Score= 33/36  Normal though reduced perceptual motor control, likely due to tremor    Trails A Score= 28s  0e 75th (adjusted for age, gender and educational level)  Trails B Score= 44s 0e 84th (adjusted for age, gender and educational level)    Wechsler Adult Intelligence Scale-IV   Subtest Scaled Score Percentile  Block Design 10 37th    Similarities   8 25th     Digit Span  Forward               Backward               Sequencing 13 10 15 12  84th        50th     95th   75rd     Matrix Reasoning 10 50th       Symbol Search      9 37th        Wechsler Memory Scale-IV Older Adult Battery  Index Index Score Percentile  Immediate Memory 117 87th     Auditory Memory 117 87th        Visual Memory 100 50th        Delayed Memory 106 66th        Symbol Span Scaled score= 11 63rd          Wide Range Achievement Test-4 Subtest  Raw score Standard score Percentile  Word Reading 54/70  92 30th      Wisconsin Card Sorting Test  Total errors= 24   81st (adjusted for age and education)  Perseverative errors= 14   75th    Categories=   6 >16th     Trials to first category= 11 >16th    Failure to maintain set   0 >16th   Learning to learn= -0.68 >16th

## 2015-08-28 ENCOUNTER — Ambulatory Visit (INDEPENDENT_AMBULATORY_CARE_PROVIDER_SITE_OTHER): Payer: Medicare Other | Admitting: Family Medicine

## 2015-08-28 DIAGNOSIS — Z23 Encounter for immunization: Secondary | ICD-10-CM

## 2015-08-30 ENCOUNTER — Other Ambulatory Visit: Payer: Self-pay | Admitting: Oncology

## 2015-09-01 ENCOUNTER — Telehealth: Payer: Self-pay | Admitting: Oncology

## 2015-09-01 NOTE — Telephone Encounter (Signed)
Called and left a message with new appointment per pof °

## 2015-09-02 ENCOUNTER — Other Ambulatory Visit: Payer: Self-pay | Admitting: *Deleted

## 2015-09-02 MED ORDER — METFORMIN HCL 500 MG PO TABS: 500.0000 mg | ORAL_TABLET | Freq: Every day | ORAL | Status: DC

## 2015-09-02 MED ORDER — LEVOTHYROXINE SODIUM 75 MCG PO TABS: 75.0000 ug | ORAL_TABLET | Freq: Every day | ORAL | Status: DC

## 2015-09-11 ENCOUNTER — Other Ambulatory Visit: Payer: Self-pay

## 2015-09-11 DIAGNOSIS — Z1239 Encounter for other screening for malignant neoplasm of breast: Secondary | ICD-10-CM

## 2015-09-11 DIAGNOSIS — Z803 Family history of malignant neoplasm of breast: Secondary | ICD-10-CM

## 2015-09-11 DIAGNOSIS — R922 Inconclusive mammogram: Secondary | ICD-10-CM

## 2015-09-15 ENCOUNTER — Other Ambulatory Visit: Payer: BLUE CROSS/BLUE SHIELD

## 2015-09-15 ENCOUNTER — Encounter: Payer: Self-pay | Admitting: Oncology

## 2015-09-15 ENCOUNTER — Other Ambulatory Visit (HOSPITAL_BASED_OUTPATIENT_CLINIC_OR_DEPARTMENT_OTHER): Payer: Medicare Other

## 2015-09-15 ENCOUNTER — Ambulatory Visit (HOSPITAL_BASED_OUTPATIENT_CLINIC_OR_DEPARTMENT_OTHER): Payer: Medicare Other | Admitting: Oncology

## 2015-09-15 ENCOUNTER — Telehealth: Payer: Self-pay | Admitting: Oncology

## 2015-09-15 ENCOUNTER — Ambulatory Visit: Payer: BLUE CROSS/BLUE SHIELD | Admitting: Oncology

## 2015-09-15 VITALS — BP 135/68 | HR 59 | Temp 97.6°F | Resp 18 | Ht 64.0 in | Wt 175.4 lb

## 2015-09-15 DIAGNOSIS — N951 Menopausal and female climacteric states: Secondary | ICD-10-CM

## 2015-09-15 DIAGNOSIS — R922 Inconclusive mammogram: Secondary | ICD-10-CM | POA: Diagnosis not present

## 2015-09-15 DIAGNOSIS — E119 Type 2 diabetes mellitus without complications: Secondary | ICD-10-CM | POA: Diagnosis not present

## 2015-09-15 DIAGNOSIS — Z803 Family history of malignant neoplasm of breast: Secondary | ICD-10-CM | POA: Diagnosis not present

## 2015-09-15 DIAGNOSIS — Z1239 Encounter for other screening for malignant neoplasm of breast: Secondary | ICD-10-CM

## 2015-09-15 LAB — COMPREHENSIVE METABOLIC PANEL
ALBUMIN: 3.9 g/dL (ref 3.5–5.0)
ALK PHOS: 51 U/L (ref 40–150)
ALT: 15 U/L (ref 0–55)
AST: 17 U/L (ref 5–34)
Anion Gap: 10 mEq/L (ref 3–11)
BILIRUBIN TOTAL: 0.43 mg/dL (ref 0.20–1.20)
BUN: 19.8 mg/dL (ref 7.0–26.0)
CALCIUM: 9.6 mg/dL (ref 8.4–10.4)
CO2: 20 mEq/L — ABNORMAL LOW (ref 22–29)
CREATININE: 1 mg/dL (ref 0.6–1.1)
Chloride: 110 mEq/L — ABNORMAL HIGH (ref 98–109)
EGFR: 54 mL/min/{1.73_m2} — ABNORMAL LOW (ref >90)
Glucose: 88 mg/dl (ref 70–140)
Potassium: 4.6 mEq/L (ref 3.5–5.1)
Sodium: 140 mEq/L (ref 136–145)
Total Protein: 7.1 g/dL (ref 6.4–8.3)

## 2015-09-15 LAB — CBC WITH DIFFERENTIAL/PLATELET
BASO%: 0.7 % (ref 0.0–2.0)
BASOS ABS: 0 10*3/uL (ref 0.0–0.1)
EOS%: 2.5 % (ref 0.0–7.0)
Eosinophils Absolute: 0.1 10*3/uL (ref 0.0–0.5)
HEMATOCRIT: 37.6 % (ref 34.8–46.6)
HEMOGLOBIN: 12.7 g/dL (ref 11.6–15.9)
LYMPH#: 1.8 10*3/uL (ref 0.9–3.3)
LYMPH%: 37.4 % (ref 14.0–49.7)
MCH: 31.2 pg (ref 25.1–34.0)
MCHC: 33.6 g/dL (ref 31.5–36.0)
MCV: 92.6 fL (ref 79.5–101.0)
MONO#: 0.3 10*3/uL (ref 0.1–0.9)
MONO%: 6.5 % (ref 0.0–14.0)
NEUT#: 2.6 10*3/uL (ref 1.5–6.5)
NEUT%: 52.9 % (ref 38.4–76.8)
Platelets: 219 10*3/uL (ref 145–400)
RBC: 4.06 10*6/uL (ref 3.70–5.45)
RDW: 13.6 % (ref 11.2–14.5)
WBC: 4.9 10*3/uL (ref 3.9–10.3)

## 2015-09-15 NOTE — Telephone Encounter (Signed)
Gave and printed appt sched and avs for pt for DEc °

## 2015-09-15 NOTE — Progress Notes (Signed)
OFFICE PROGRESS NOTE   09/15/2015   Physicians:B.Burchette (PCP), Alonza Bogus (neurology), K.Clance  INTERVAL HISTORY:  Patient is seen in yearly follow up of high risk for breast cancer based on family history of breast cancer in premenopausal sister, and dense breast tissue with multiple biopsies herself. She is on observation since completing tamoxifen/ raloxifene on study Sept 2007. Most recent bilateral tomo mamography was at [facility] 12-04-14. She has had no recent concerns from this standpoint. Hot flashes persist, tho have been worse in past.  She has no PAC. She has not had genetics testing. Sister with premenopausal breast cancer has not agreed to testing to this point. She will discuss again with sister;She has the names of genetics counselors at [facility], in case she or sister have other questions.    ONCOLOGIC HISTORY High risk for breast cancer due to history of breast cancer in premenopausal sister, personal hx dense breast tissue and multiple previous breast biopsies. Treatment on STAR study, initially with tamoxifen and then raloxifene, completed Sept 2007. She remains on observation.  Review of systems as above, also: No new or different pain. No bleeding or bruising. No GI or cardiac symptoms. Tremor some better after a number of medication changes by Dr Carles Collet Angus Seller of 10 point Review of Systems negative.  Objective:  Vital signs in last 24 hours:  BP 135/68 mmHg  Pulse 59  Temp(Src) 97.6 F (36.4 C) (Oral)  Resp 18  Ht _0  (1.626 m)  Wt 175 lb 6.4 oz (79.561 kg)  BMI 30.09 kg/m2  SpO2 100%  Alert, oriented and appropriate. Ambulatory without difficulty.    HEENT:PERRL, sclerae not icteric. Oral mucosa moist without lesions, posterior pharynx clear. Nasal turbinates boggy without purulent drainage. TMs clear.  Neck supple. No JVD.  Lymphatics:no cervical,suraclavicular, axillary or inguinal adenopathy Resp: Scattered fine crackles bilaterally in mid and  lower fields, no wheezing, no use of accessory muscles, normal percussion bilaterally. Respirations not labored RA Cardio: regular rate and rhythm. No gallop. GI: soft, nontender, not distended, no mass or organomegaly. Normally active bowel sounds.  Musculoskeletal/ Extremities: without pitting edema, cords, tenderness Neuro:  Resting tremor in hands very minimal today, none with head/ neck presently. Skin without rash, ecchymosis, petechiae Breasts: bilateral scars well healed from benign biopsies, otherwise without dominant mass, skin or nipple findings. Axillae benign.   Lab Results:  Results for orders placed or performed in visit on 09/15/15  CBC with Differential  Result Value Ref Range   WBC 4.9 3.9 - 10.3 10e3/uL   NEUT# 2.6 1.5 - 6.5 10e3/uL   HGB 12.7 11.6 - 15.9 g/dL   HCT 37.6 34.8 - 46.6 %   Platelets 219 145 - 400 10e3/uL   MCV 92.6 79.5 - 101.0 fL   MCH 31.2 25.1 - 34.0 pg   MCHC 33.6 31.5 - 36.0 g/dL   RBC 4.06 3.70 - 5.45 10e6/uL   RDW 13.6 11.2 - 14.5 %   lymph# 1.8 0.9 - 3.3 10e3/uL   MONO# 0.3 0.1 - 0.9 10e3/uL   Eosinophils Absolute 0.1 0.0 - 0.5 10e3/uL   Basophils Absolute 0.0 0.0 - 0.1 10e3/uL   NEUT% 52.9 38.4 - 76.8 %   LYMPH% 37.4 14.0 - 49.7 %   MONO% 6.5 0.0 - 14.0 %   EOS% 2.5 0.0 - 7.0 %   BASO% 0.7 0.0 - 2.0 %  Comprehensive metabolic panel  Result Value Ref Range   Sodium 140 136 - 145 mEq/L   Potassium  4.6 3.5 - 5.1 mEq/L   Chloride 110 (H) 98 - 109 mEq/L   CO2 20 (L) 22 - 29 mEq/L   Glucose 88 70 - 140 mg/dl   BUN 19.8 7.0 - 26.0 mg/dL   Creatinine 1.0 0.6 - 1.1 mg/dL   Total Bilirubin 0.43 0.20 - 1.20 mg/dL   Alkaline Phosphatase 51 40 - 150 U/L   AST 17 5 - 34 U/L   ALT 15 0 - 55 U/L   Total Protein 7.1 6.4 - 8.3 g/dL   Albumin 3.9 3.5 - 5.0 g/dL   Calcium 9.6 8.4 - 10.4 mg/dL   Anion Gap 10 3 - 11 mEq/L   EGFR 54 (L) >90 ml/min/1.73 m2    Morphology not available until after visit, occasional giant platelet  noted.  Studies/Results: Bilateral [facility]   Medications: I have reviewed the patient's current medications.   DISCUSSION: genetics counseling as above; patient is hopeful that sister will agree to testing now, as she and patient's daughter would like to know this for themselves.  Assessment/Plan:  : 1.high risk for development of breast cancer based on dense breast tissue, family history of breast cancer in premenopausal sister and multiple previous breast biopsies. Previously treated on STAR study as above. Will image with 3D/tomo mammograms due to dense tissue, due in Feb. I will see her back at least in a year, or sooner if needed. Genetics testing needs to be done on individual with breast cancer diagnosis initially, which she will discuss again with sister. 2. Minimal thrombocytopenia. Platelet count has normalized. 3.persistant hot flashes in patient who is not appropriate for hormonal suppression. No worse 4. Hx ocular fungal infection and corneal implants 2010  5.Diabetes, hypothyroid, elevated lipids,.post hysterectomy 6. Essential tremor: improvement with medication changes by neurology 7.renal evaluation with [place] and CT 10-2013 with bilateral renal cysts, no renal mass  Patient knows that she can call prior to next scheduled visit if needed. Cc this note to Dr Elease Hashimoto and Dr Tat Time spent 30 min including >50% counseling and coordination of care  Mikey Bussing, NP   09/15/2015, 12:47 PM

## 2015-10-08 ENCOUNTER — Other Ambulatory Visit: Payer: Self-pay

## 2015-10-08 DIAGNOSIS — Z1231 Encounter for screening mammogram for malignant neoplasm of breast: Secondary | ICD-10-CM

## 2015-10-10 DIAGNOSIS — L820 Inflamed seborrheic keratosis: Secondary | ICD-10-CM | POA: Diagnosis not present

## 2015-10-10 DIAGNOSIS — L570 Actinic keratosis: Secondary | ICD-10-CM | POA: Diagnosis not present

## 2015-10-10 DIAGNOSIS — D239 Other benign neoplasm of skin, unspecified: Secondary | ICD-10-CM | POA: Diagnosis not present

## 2015-10-12 HISTORY — PX: CATARACT EXTRACTION: SUR2

## 2015-10-22 ENCOUNTER — Other Ambulatory Visit: Payer: Self-pay | Admitting: *Deleted

## 2015-10-22 MED ORDER — ETODOLAC 400 MG PO TABS: 400.0000 mg | ORAL_TABLET | Freq: Every day | ORAL | Status: DC

## 2015-10-22 NOTE — Telephone Encounter (Signed)
Rx done. 

## 2015-11-12 ENCOUNTER — Ambulatory Visit
Admission: RE | Admit: 2015-11-12 | Discharge: 2015-11-12 | Disposition: A | Discharge status: Home / Self Care | Payer: PPO | Source: Ambulatory Visit

## 2015-11-12 DIAGNOSIS — Z1231 Encounter for screening mammogram for malignant neoplasm of breast: Secondary | ICD-10-CM

## 2015-11-13 ENCOUNTER — Other Ambulatory Visit: Payer: Self-pay | Admitting: *Deleted

## 2015-11-13 MED ORDER — GABAPENTIN 600 MG PO TABS: 600.0000 mg | ORAL_TABLET | Freq: Two times a day (BID) | ORAL | Status: DC

## 2015-12-10 DIAGNOSIS — B3789 Other sites of candidiasis: Secondary | ICD-10-CM | POA: Diagnosis not present

## 2015-12-10 DIAGNOSIS — K625 Hemorrhage of anus and rectum: Secondary | ICD-10-CM | POA: Diagnosis not present

## 2015-12-10 DIAGNOSIS — Z86010 Personal history of colonic polyps: Secondary | ICD-10-CM | POA: Diagnosis not present

## 2015-12-16 ENCOUNTER — Other Ambulatory Visit: Payer: Self-pay | Admitting: *Deleted

## 2015-12-16 ENCOUNTER — Other Ambulatory Visit: Payer: Self-pay | Admitting: Neurology

## 2015-12-16 MED ORDER — PRIMIDONE 50 MG PO TABS: ORAL_TABLET | ORAL | Status: DC

## 2015-12-16 MED ORDER — METFORMIN HCL 500 MG PO TABS: 500.0000 mg | ORAL_TABLET | Freq: Every day | ORAL | Status: DC

## 2015-12-16 MED ORDER — GABAPENTIN 600 MG PO TABS: 600.0000 mg | ORAL_TABLET | Freq: Two times a day (BID) | ORAL | Status: DC

## 2015-12-16 MED ORDER — PROPRANOLOL HCL 20 MG PO TABS: 20.0000 mg | ORAL_TABLET | Freq: Two times a day (BID) | ORAL | Status: DC

## 2015-12-16 NOTE — Telephone Encounter (Signed)
Primidone and Propranolol refill requested. Per last office note- patient to remain on medication. Refill approved and sent to patient's pharmacy.

## 2015-12-24 DIAGNOSIS — N951 Menopausal and female climacteric states: Secondary | ICD-10-CM | POA: Diagnosis not present

## 2015-12-24 DIAGNOSIS — R739 Hyperglycemia, unspecified: Secondary | ICD-10-CM | POA: Diagnosis not present

## 2015-12-24 DIAGNOSIS — E039 Hypothyroidism, unspecified: Secondary | ICD-10-CM | POA: Diagnosis not present

## 2015-12-24 DIAGNOSIS — R251 Tremor, unspecified: Secondary | ICD-10-CM | POA: Diagnosis not present

## 2015-12-24 DIAGNOSIS — K219 Gastro-esophageal reflux disease without esophagitis: Secondary | ICD-10-CM | POA: Diagnosis not present

## 2015-12-24 DIAGNOSIS — I10 Essential (primary) hypertension: Secondary | ICD-10-CM | POA: Diagnosis not present

## 2015-12-24 DIAGNOSIS — Z79899 Other long term (current) drug therapy: Secondary | ICD-10-CM | POA: Diagnosis not present

## 2015-12-31 DIAGNOSIS — K602 Anal fissure, unspecified: Secondary | ICD-10-CM | POA: Diagnosis not present

## 2015-12-31 DIAGNOSIS — L309 Dermatitis, unspecified: Secondary | ICD-10-CM | POA: Diagnosis not present

## 2016-01-14 DIAGNOSIS — H2511 Age-related nuclear cataract, right eye: Secondary | ICD-10-CM | POA: Diagnosis not present

## 2016-01-14 DIAGNOSIS — Z961 Presence of intraocular lens: Secondary | ICD-10-CM | POA: Diagnosis not present

## 2016-01-19 ENCOUNTER — Other Ambulatory Visit: Payer: Self-pay | Admitting: Neurology

## 2016-01-19 MED ORDER — PROPRANOLOL HCL 20 MG PO TABS: 20.0000 mg | ORAL_TABLET | Freq: Two times a day (BID) | ORAL | Status: DC

## 2016-01-19 NOTE — Telephone Encounter (Signed)
Propranolol refill requested. Per last office note- patient to remain on medication. Refill approved and sent to patient's pharmacy.   

## 2016-02-11 ENCOUNTER — Ambulatory Visit: Payer: Medicare Other | Admitting: Neurology

## 2016-02-16 DIAGNOSIS — L570 Actinic keratosis: Secondary | ICD-10-CM | POA: Diagnosis not present

## 2016-02-16 DIAGNOSIS — L309 Dermatitis, unspecified: Secondary | ICD-10-CM | POA: Diagnosis not present

## 2016-02-16 DIAGNOSIS — L920 Granuloma annulare: Secondary | ICD-10-CM | POA: Diagnosis not present

## 2016-02-25 ENCOUNTER — Ambulatory Visit: Payer: Medicare Other | Admitting: Neurology

## 2016-02-26 DIAGNOSIS — H2511 Age-related nuclear cataract, right eye: Secondary | ICD-10-CM | POA: Diagnosis not present

## 2016-02-26 DIAGNOSIS — Z9842 Cataract extraction status, left eye: Secondary | ICD-10-CM | POA: Diagnosis not present

## 2016-02-26 DIAGNOSIS — Z961 Presence of intraocular lens: Secondary | ICD-10-CM | POA: Diagnosis not present

## 2016-02-26 DIAGNOSIS — Z9889 Other specified postprocedural states: Secondary | ICD-10-CM | POA: Diagnosis not present

## 2016-03-09 DIAGNOSIS — H25811 Combined forms of age-related cataract, right eye: Secondary | ICD-10-CM | POA: Diagnosis not present

## 2016-03-09 DIAGNOSIS — E119 Type 2 diabetes mellitus without complications: Secondary | ICD-10-CM | POA: Diagnosis not present

## 2016-03-09 DIAGNOSIS — Z7984 Long term (current) use of oral hypoglycemic drugs: Secondary | ICD-10-CM | POA: Diagnosis not present

## 2016-03-09 DIAGNOSIS — Z961 Presence of intraocular lens: Secondary | ICD-10-CM | POA: Diagnosis not present

## 2016-03-09 DIAGNOSIS — K219 Gastro-esophageal reflux disease without esophagitis: Secondary | ICD-10-CM | POA: Diagnosis not present

## 2016-03-09 DIAGNOSIS — Z7982 Long term (current) use of aspirin: Secondary | ICD-10-CM | POA: Diagnosis not present

## 2016-03-09 DIAGNOSIS — E039 Hypothyroidism, unspecified: Secondary | ICD-10-CM | POA: Diagnosis not present

## 2016-03-09 DIAGNOSIS — G250 Essential tremor: Secondary | ICD-10-CM | POA: Diagnosis not present

## 2016-03-09 DIAGNOSIS — G4733 Obstructive sleep apnea (adult) (pediatric): Secondary | ICD-10-CM | POA: Diagnosis not present

## 2016-03-09 DIAGNOSIS — H2511 Age-related nuclear cataract, right eye: Secondary | ICD-10-CM | POA: Diagnosis not present

## 2016-04-06 ENCOUNTER — Encounter: Payer: Self-pay | Admitting: Neurology

## 2016-04-06 ENCOUNTER — Ambulatory Visit (INDEPENDENT_AMBULATORY_CARE_PROVIDER_SITE_OTHER): Payer: PPO | Admitting: Neurology

## 2016-04-06 VITALS — BP 120/70 | HR 56 | Ht 64.0 in | Wt 172.0 lb

## 2016-04-06 DIAGNOSIS — G250 Essential tremor: Secondary | ICD-10-CM | POA: Diagnosis not present

## 2016-04-06 DIAGNOSIS — R001 Bradycardia, unspecified: Secondary | ICD-10-CM | POA: Diagnosis not present

## 2016-04-06 MED ORDER — PROPRANOLOL HCL 20 MG PO TABS: 20.0000 mg | ORAL_TABLET | Freq: Two times a day (BID) | ORAL | Status: DC

## 2016-04-06 MED ORDER — PRIMIDONE 50 MG PO TABS: ORAL_TABLET | ORAL | Status: DC
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
## 2016-04-29 IMAGING — MR MR HEAD WO/W CM
11 of 12 series · 40 of 48 positions shown · IV contrast (multihance)
Comparison: MR brain 03/14/2006.

CLINICAL DATA: Memory loss for 6 months.  Headaches for 2 months.

EXAM:
MRI HEAD WITHOUT AND WITH CONTRAST
TECHNIQUE: Multiplanar, multiecho pulse sequences of the brain and surrounding
structures were obtained without and with intravenous contrast.
CONTRAST:  15mL MULTIHANCE GADOBENATE DIMEGLUMINE 529 MG/ML IV SOLN
Creatinine were obtained on site at [HOSPITAL] at
[HOSPITAL].
Results: Creatinine 0.9 mg/dL.

[Series 2: T1 · sagittal · 5.0mm · 0.45mm/px · 1 of 19 slices shown]
[im 1/19]
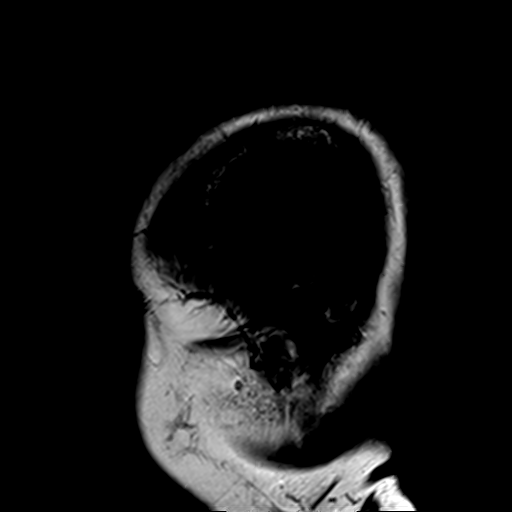

[Series 3: T2 · axial · 5.0mm · 0.45mm/px · 1 of 22 slices shown (1 of 2)]
[im 1/22]
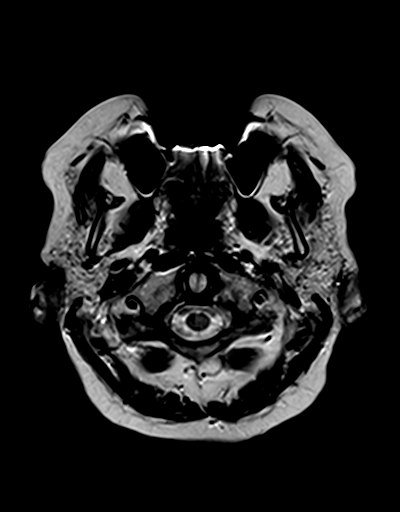

[Series 4: DWI · axial · 3.0mm · 0.94mm/px · z∈[-48,+93]mm · 7 of 95 slices shown (1 of 2)]
[im 1/95]
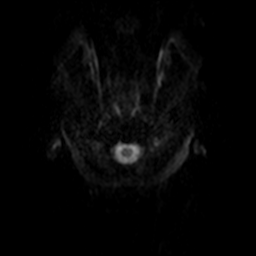
[im 16/95]
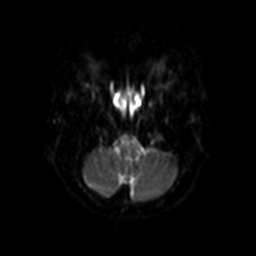
[im 32/95]
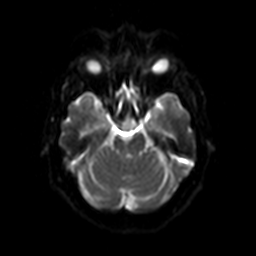
[im 48/95]
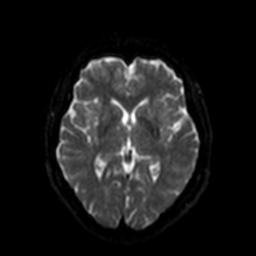
[im 63/95]
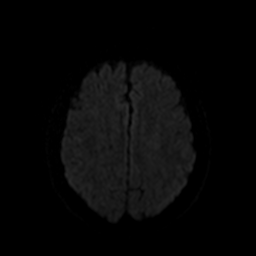
[im 79/95]
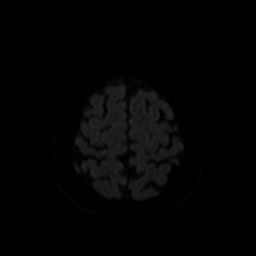
[im 95/95]
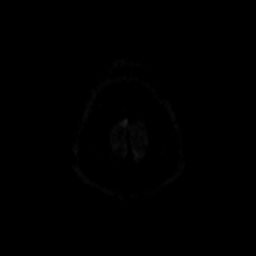

[Series 5: dwi_adc · axial · 3.0mm · 0.94mm/px · z∈[-48,+93]mm · 4 of 48 slices shown]
[im 1/48]
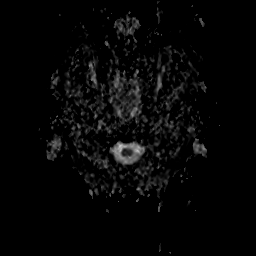
[im 16/48]
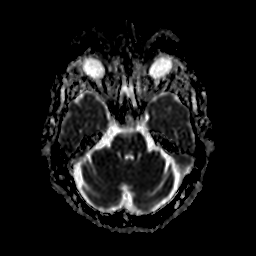
[im 32/48]
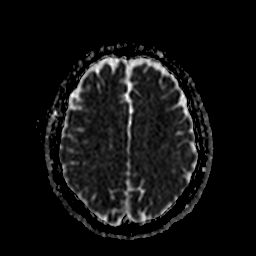
[im 48/48]
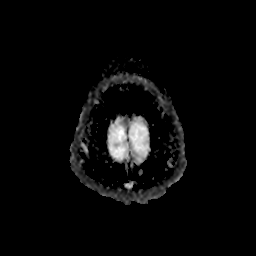

[Series 6: DWI · coronal · 5.0mm · 0.90mm/px · 5 of 64 slices shown (2 of 2)]
[im 1/64]
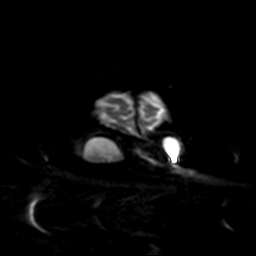
[im 16/64]
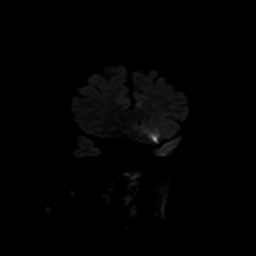
[im 32/64]
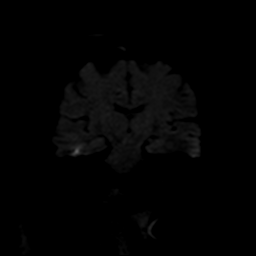
[im 48/64]
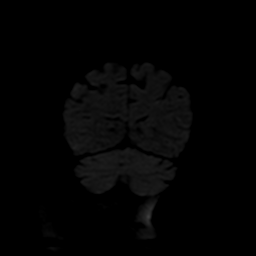
[im 64/64]
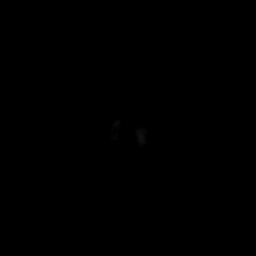

[Series 7: cor dwi_adc · coronal · 5.0mm · 0.90mm/px · 3 of 32 slices shown]
[im 1/32]
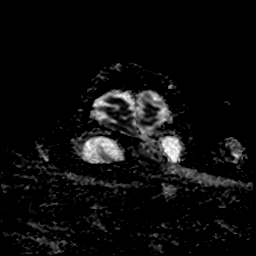
[im 16/32]
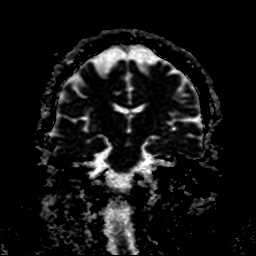
[im 32/32]
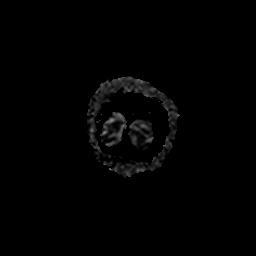

[Series 8: FLAIR · axial · 5.0mm · 0.45mm/px · z∈[-46,+90]mm · 2 of 22 slices shown]
[im 1/22]
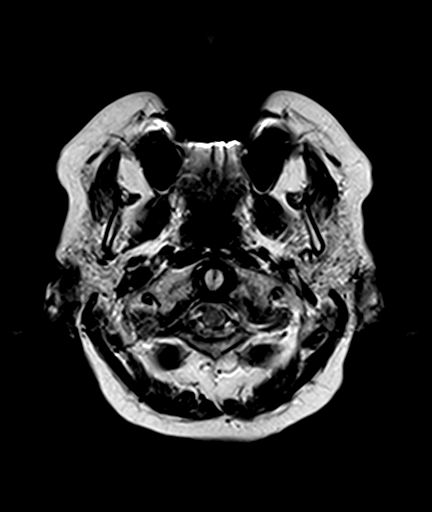
[im 22/22]
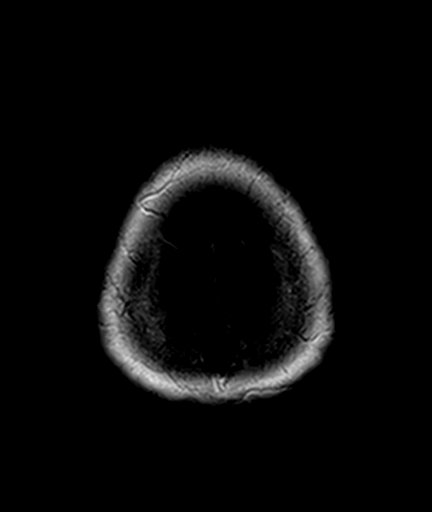

[Series 9: axial (person_name)1 volume · axial · 2.0mm · 0.45mm/px · z∈[-57,+101]mm · 7 of 80 slices shown]
[im 1/80]
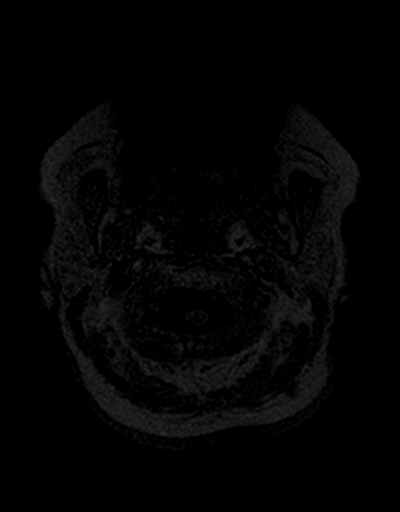
[im 14/80]
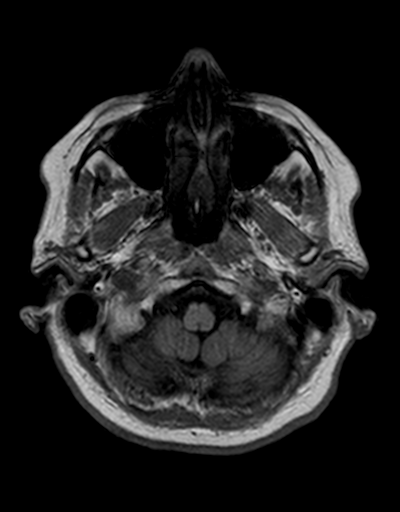
[im 27/80]
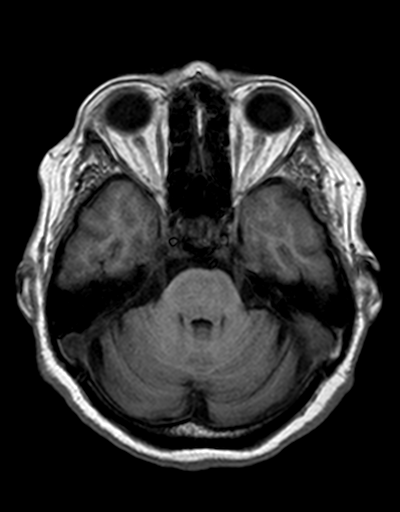
[im 40/80]
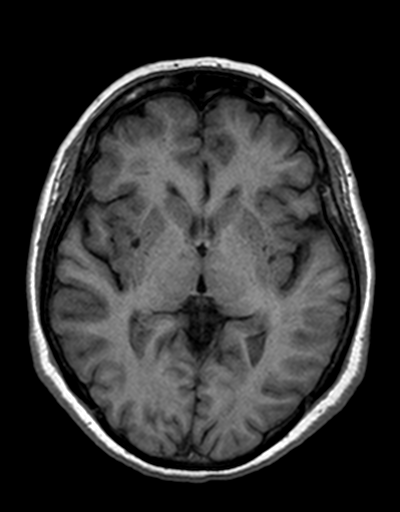
[im 53/80]
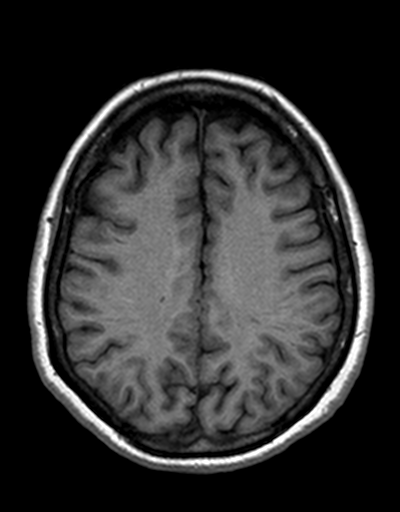
[im 66/80]
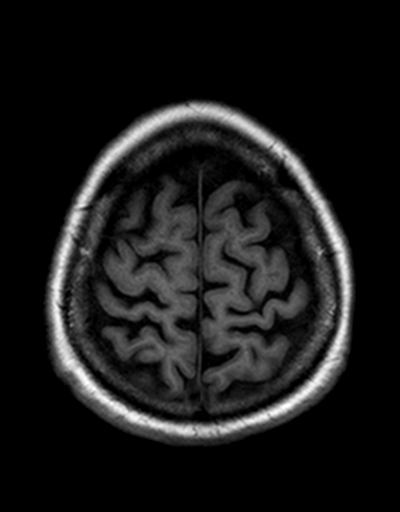
[im 80/80]
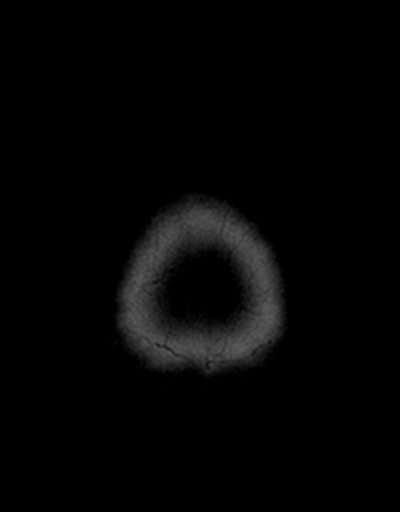

[Series 11: swi_images · axial · 2.0mm · 0.90mm/px · z∈[-57,+101]mm · 7 of 80 slices shown]
[im 1/80]
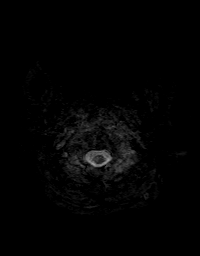
[im 14/80]
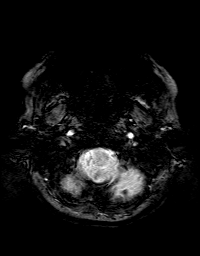
[im 27/80]
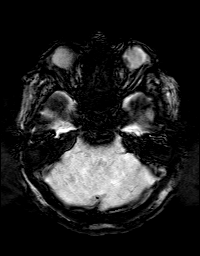
[im 40/80]
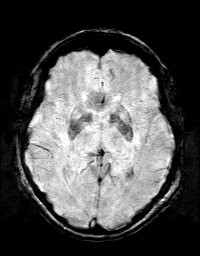
[im 53/80]
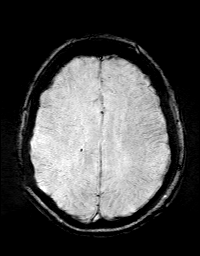
[im 66/80]
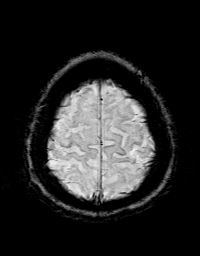
[im 80/80]
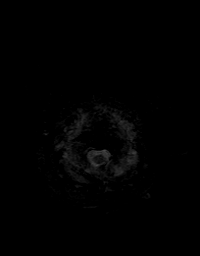

[Series 12: T2 · coronal · 5.0mm · 0.45mm/px · 2 of 24 slices shown (2 of 2)]
[im 1/24]
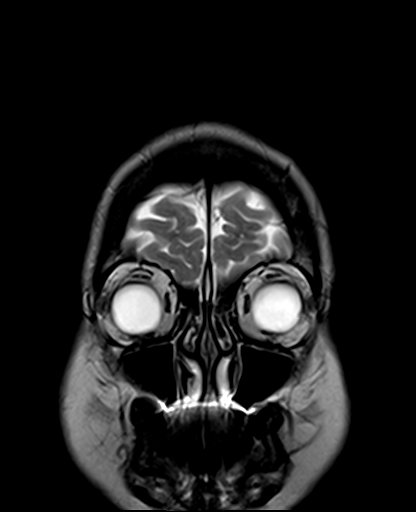
[im 24/24]
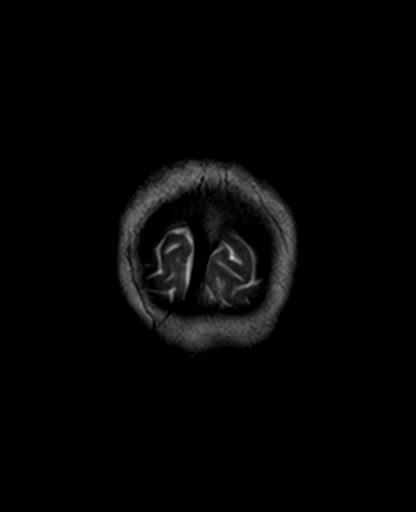

[Series 13: post_axial (person_name) · axial · 2.0mm · 0.45mm/px · 1 of 80 slices shown]
[im 1/80]
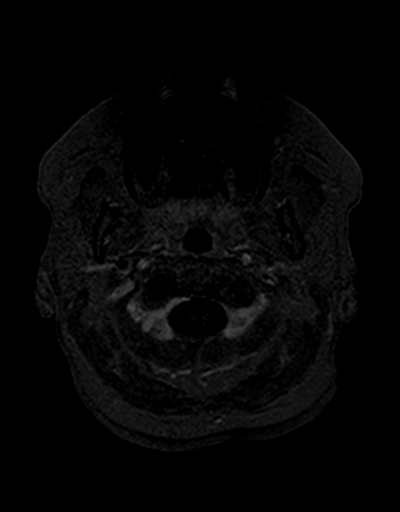

[40 of 48 positions shown; findings below may reference images not displayed]

FINDINGS: No evidence for acute infarction, hemorrhage, mass lesion,
hydrocephalus, or extra-axial fluid. Mild cerebral and cerebellar
atrophy. Mild subcortical and periventricular T2 and FLAIR
hyperintensities, likely chronic microvascular ischemic change. Flow
voids are maintained throughout the carotid, basilar, and vertebral
arteries. There are no areas of chronic hemorrhage. Pituitary,
pineal, and cerebellar tonsils unremarkable. No upper cervical
lesions.

Post infusion, no abnormal enhancement of the brain or meninges.
Incidental venous angioma is stable, RIGHT posterior frontal lobe.
Extracranial soft tissues unremarkable. Essentially stable
appearance from 4222.
IMPRESSION: Chronic changes as described.  No acute intracranial findings.

No specific abnormality is detected which might contribute to memory
loss.

## 2016-07-30 DIAGNOSIS — H26491 Other secondary cataract, right eye: Secondary | ICD-10-CM | POA: Diagnosis not present

## 2016-08-13 DIAGNOSIS — N951 Menopausal and female climacteric states: Secondary | ICD-10-CM | POA: Diagnosis not present

## 2016-08-13 DIAGNOSIS — M6740 Ganglion, unspecified site: Secondary | ICD-10-CM | POA: Diagnosis not present

## 2016-08-13 DIAGNOSIS — M545 Low back pain: Secondary | ICD-10-CM | POA: Diagnosis not present

## 2016-08-13 DIAGNOSIS — Z23 Encounter for immunization: Secondary | ICD-10-CM | POA: Diagnosis not present

## 2016-08-13 DIAGNOSIS — G250 Essential tremor: Secondary | ICD-10-CM | POA: Diagnosis not present

## 2016-08-18 DIAGNOSIS — R739 Hyperglycemia, unspecified: Secondary | ICD-10-CM | POA: Diagnosis not present

## 2016-08-18 DIAGNOSIS — E785 Hyperlipidemia, unspecified: Secondary | ICD-10-CM | POA: Diagnosis not present

## 2016-08-18 DIAGNOSIS — Z79899 Other long term (current) drug therapy: Secondary | ICD-10-CM | POA: Diagnosis not present

## 2016-08-18 DIAGNOSIS — E039 Hypothyroidism, unspecified: Secondary | ICD-10-CM | POA: Diagnosis not present

## 2016-09-05 ENCOUNTER — Other Ambulatory Visit: Payer: Self-pay | Admitting: Oncology

## 2016-09-13 ENCOUNTER — Ambulatory Visit (HOSPITAL_BASED_OUTPATIENT_CLINIC_OR_DEPARTMENT_OTHER): Payer: PPO | Admitting: Oncology

## 2016-09-13 ENCOUNTER — Other Ambulatory Visit (HOSPITAL_BASED_OUTPATIENT_CLINIC_OR_DEPARTMENT_OTHER): Payer: PPO

## 2016-09-13 ENCOUNTER — Encounter: Payer: Self-pay | Admitting: Oncology

## 2016-09-13 VITALS — BP 113/69 | HR 60 | Temp 98.1°F | Resp 18 | Wt 172.8 lb

## 2016-09-13 DIAGNOSIS — R922 Inconclusive mammogram: Secondary | ICD-10-CM

## 2016-09-13 DIAGNOSIS — Z90710 Acquired absence of both cervix and uterus: Secondary | ICD-10-CM

## 2016-09-13 DIAGNOSIS — Z9189 Other specified personal risk factors, not elsewhere classified: Secondary | ICD-10-CM | POA: Insufficient documentation

## 2016-09-13 DIAGNOSIS — E119 Type 2 diabetes mellitus without complications: Secondary | ICD-10-CM

## 2016-09-13 LAB — CBC WITH DIFFERENTIAL/PLATELET
BASO%: 0.7 % (ref 0.0–2.0)
BASOS ABS: 0 10*3/uL (ref 0.0–0.1)
EOS ABS: 0.1 10*3/uL (ref 0.0–0.5)
EOS%: 1.4 % (ref 0.0–7.0)
HEMATOCRIT: 39.3 % (ref 34.8–46.6)
HGB: 12.8 g/dL (ref 11.6–15.9)
LYMPH#: 2.6 10*3/uL (ref 0.9–3.3)
LYMPH%: 44.8 % (ref 14.0–49.7)
MCH: 29.5 pg (ref 25.1–34.0)
MCHC: 32.5 g/dL (ref 31.5–36.0)
MCV: 91 fL (ref 79.5–101.0)
MONO#: 0.3 10*3/uL (ref 0.1–0.9)
MONO%: 5.3 % (ref 0.0–14.0)
NEUT#: 2.7 10*3/uL (ref 1.5–6.5)
NEUT%: 47.8 % (ref 38.4–76.8)
PLATELETS: 174 10*3/uL (ref 145–400)
RBC: 4.32 10*6/uL (ref 3.70–5.45)
RDW: 13.9 % (ref 11.2–14.5)
WBC: 5.7 10*3/uL (ref 3.9–10.3)

## 2016-09-13 LAB — COMPREHENSIVE METABOLIC PANEL
ALT: 19 U/L (ref 0–55)
ANION GAP: 10 meq/L (ref 3–11)
AST: 20 U/L (ref 5–34)
Albumin: 4.1 g/dL (ref 3.5–5.0)
Alkaline Phosphatase: 73 U/L (ref 40–150)
BUN: 15.8 mg/dL (ref 7.0–26.0)
CALCIUM: 9.8 mg/dL (ref 8.4–10.4)
CHLORIDE: 108 meq/L (ref 98–109)
CO2: 23 mEq/L (ref 22–29)
CREATININE: 0.9 mg/dL (ref 0.6–1.1)
EGFR: 68 mL/min/{1.73_m2} — AB (ref >90)
Glucose: 116 mg/dl (ref 70–140)
Potassium: 4.4 mEq/L (ref 3.5–5.1)
Sodium: 141 mEq/L (ref 136–145)
Total Bilirubin: 0.25 mg/dL (ref 0.20–1.20)
Total Protein: 7.5 g/dL (ref 6.4–8.3)
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
## 2016-09-13 NOTE — Progress Notes (Signed)
OFFICE PROGRESS NOTE   September 13, 2016   Physicians: B.Burchette (PCP), Alonza Bogus (neurology), K.Clance, M.Giegengack ([facility] ophth), R.Buccini   INTERVAL HISTORY:   Patient is seen, alone for visit, in one year follow up due to high risk for breast cancer based on family history of breast cancer in premenopausal sister and extremely dense breast tissue. She completed STAR prevention study with tamoxifen / raloxifene [id number], on observation since then. Most recent tomo bilateral mammograms were done at [facility] 11-13-15, heterogeneously dense breast tissue but no other findings of concern. She had complete hysterectomy, including ovaries, by Dr Jamal Maes years ago. Sister is doing well, without recurrence of breast cancer. The sister has not wanted genetics evaluation.  She does not have regularly scheduled appointments with PCP; I have encouraged her to set up physical exam at that office yearly.   Patient has generally been well, tho had 2 episodes of diarrhea within 10 days recently. She is up to date on colonoscopy by Dr Cristina Gong ~ 2 years ago, unremarkable per patient. First episode of diarrhea happened at beach, tho others were not ill, lasted most of one day and resolved with imodium, bowels then back to normal. Second episode of diarrhea over ~ 6 hrs was 10 days later, slight nausea/ no vomiting with this, no fever, no bleeding, no severe abdominal pain.  Otherwise, no changes in either breast, no swelling UE, no bleeding, no respiratory symptoms. She remains active including with church. She had right cataract removed at [facility], where she is also followed since left lens implant and ophthalmologic fungal infection.  Remainder of 10 point Review of Systems negative.    She has no PAC. She has not had genetics testing. Sister with premenopausal breast cancer has not agreed to testing to this point  ONCOLOGIC HISTORY  High risk for breast cancer due to history of breast  cancer in premenopausal sister, personal hx dense breast tissue and multiple previous breast biopsies. Treatment on STAR study, initially with tamoxifen and then raloxifene, completed Sept 2007, on observation since then.  Objective:  Vital signs in last 24 hours:  BP 113/69 (BP Location: Left Arm, Patient Position: Sitting)   Pulse 60   Temp 98.1 F (36.7 C) (Oral)   Resp 18   Wt 172 lb 12.8 oz (78.4 kg)   SpO2 100%   BMI 29.66 kg/m  Weight down 3 lbs Alert, oriented and appropriate. Ambulatory without  difficulty.   HEENT:PERRL, sclerae not icteric. Oral mucosa moist without lesions, posterior pharynx clear.  Neck supple. No JVD.  Lymphatics:no cervical,supraclavicular, axillary or inguinal adenopathy Resp: clear to auscultation bilaterally and normal percussion bilaterally Cardio: regular rate and rhythm. No gallop. GI: soft, nontender, not distended, no mass or organomegaly. Normally active bowel sounds.  Musculoskeletal/ Extremities: UE/ LE without pitting edema, cords, tenderness Neuro:  nonfocal PSYCH appropriate mood and affect Skin without rash, ecchymosis, petechiae Breasts:Left with well healed surgical scars laterally and superiorly, otherwise bilaterally without dominant mass, skin or nipple findings. Axillae benign.   Lab Results:  Results for orders placed or performed in visit on 09/13/16  CBC with Differential/Platelet  Result Value Ref Range   WBC 5.7 3.9 - 10.3 10e3/uL   NEUT# 2.7 1.5 - 6.5 10e3/uL   HGB 12.8 11.6 - 15.9 g/dL   HCT 39.3 34.8 - 46.6 %   Platelets 174 145 - 400 10e3/uL   MCV 91.0 79.5 - 101.0 fL   MCH 29.5 25.1 - 34.0 pg  MCHC 32.5 31.5 - 36.0 g/dL   RBC 4.32 3.70 - 5.45 10e6/uL   RDW 13.9 11.2 - 14.5 %   lymph# 2.6 0.9 - 3.3 10e3/uL   MONO# 0.3 0.1 - 0.9 10e3/uL   Eosinophils Absolute 0.1 0.0 - 0.5 10e3/uL   Basophils Absolute 0.0 0.0 - 0.1 10e3/uL   NEUT% 47.8 38.4 - 76.8 %   LYMPH% 44.8 14.0 - 49.7 %   MONO% 5.3 0.0 - 14.0 %    EOS% 1.4 0.0 - 7.0 %   BASO% 0.7 0.0 - 2.0 %  Comprehensive metabolic panel  Result Value Ref Range   Sodium 141 136 - 145 mEq/L   Potassium 4.4 3.5 - 5.1 mEq/L   Chloride 108 98 - 109 mEq/L   CO2 23 22 - 29 mEq/L   Glucose 116 70 - 140 mg/dl   BUN 15.8 7.0 - 26.0 mg/dL   Creatinine 0.9 0.6 - 1.1 mg/dL   Total Bilirubin 0.25 0.20 - 1.20 mg/dL   Alkaline Phosphatase 73 40 - 150 U/L   AST 20 5 - 34 U/L   ALT 19 0 - 55 U/L   Total Protein 7.5 6.4 - 8.3 g/dL   Albumin 4.1 3.5 - 5.0 g/dL   Calcium 9.8 8.4 - 10.4 mg/dL   Anion Gap 10 3 - 11 mEq/L   EGFR 68 (L) >90 ml/min/1.73 m2     Studies/Results:  3D mammograms due at [facility] 11-2016. [facility] Center will send her a reminder, or PCP can set up  Medications: I have reviewed the patient's current medications. PCP will refill effexor, used for hot flashes.  DISCUSSION I offered "Breast Survivorship" clinic at [facility], tho patient does not really fit that follow up, given no personal cancer history and no known genetic problems.  Appropriate to follow now by PCP, with prn oncology visits, as patient has never had personal cancer diagnosis. She will continue to encourage her sister to consider genetics counseling, tho subsequent to the premenopausal breast cancer that sister has had no oncology problems. Patient does not need regular gyn exams as she has had complete hysterectomy. She should have 3D mammograms yearly, as these are best to image dense breast tissue.   Patient is comfortable with recommendations as above and expresses appreciation for care.   Assessment/Plan:  1.high risk for development of breast cancer based on dense breast tissue, family history of breast cancer in premenopausal sister and multiple previous breast biopsies. Previously treated on STAR study as above. Should have 3D/tomo mammograms due to dense tissue, due in Feb 2018. Genetics testing needs to be done on individual with breast cancer diagnosis  initially, which she will discuss again with sister. Prn follow up now with medical oncology 2. Minimal thrombocytopenia previously, resolved. Counts again normal and good today 3.persistant hot flashes in patient who is not appropriate for hormonal suppression. No worse, continues Effexor 4. Hx ocular fungal infection and corneal implants 2010, right cataract extraction 2017 5. 2 episodes of diarrhea in last few weeks as above. Up to date on colonoscopy, normal bowels now. Recommended she contact PCP if reoccurs, in case cultures needed.  6.Diabetes, hypothyroid, elevated lipids,.post hysterectomy 7. Essential tremor: improvement with medication changes by neurology 8.renal evaluation with [place] and CT 10-2013 with bilateral renal cysts, no renal mass. Creatinine good today. 9.Flu vaccine done fall 2017  All questions answered and patient is in agreement with plans. TIme spent 15 min including >50% counseling and coordination of care.  Cc  Dr Elease Hashimoto   Evlyn Clines, MD   09/13/2016, 8:43 AM

## 2016-09-15 ENCOUNTER — Encounter: Payer: Self-pay | Admitting: Neurology

## 2016-09-15 ENCOUNTER — Ambulatory Visit (INDEPENDENT_AMBULATORY_CARE_PROVIDER_SITE_OTHER): Payer: PPO | Admitting: Neurology

## 2016-09-15 VITALS — BP 118/60 | HR 64 | Ht 64.0 in | Wt 175.0 lb

## 2016-09-15 DIAGNOSIS — G250 Essential tremor: Secondary | ICD-10-CM | POA: Diagnosis not present

## 2016-09-15 DIAGNOSIS — R001 Bradycardia, unspecified: Secondary | ICD-10-CM

## 2016-09-27 ENCOUNTER — Other Ambulatory Visit: Payer: Self-pay | Admitting: Neurology

## 2016-09-27 MED ORDER — PRIMIDONE 50 MG PO TABS: ORAL_TABLET | ORAL | 1 refills | Status: DC

## 2016-09-27 MED ORDER — PROPRANOLOL HCL 20 MG PO TABS: 20.0000 mg | ORAL_TABLET | Freq: Two times a day (BID) | ORAL | 1 refills | Status: DC

## 2016-11-26 ENCOUNTER — Other Ambulatory Visit: Payer: Self-pay | Admitting: Family Medicine

## 2016-11-26 DIAGNOSIS — Z1231 Encounter for screening mammogram for malignant neoplasm of breast: Secondary | ICD-10-CM

## 2016-12-13 ENCOUNTER — Ambulatory Visit
Admission: RE | Admit: 2016-12-13 | Discharge: 2016-12-13 | Disposition: A | Discharge status: Home / Self Care | Payer: PPO | Source: Ambulatory Visit | Attending: Family Medicine | Admitting: Family Medicine

## 2016-12-13 DIAGNOSIS — Z1231 Encounter for screening mammogram for malignant neoplasm of breast: Secondary | ICD-10-CM

## 2016-12-22 ENCOUNTER — Ambulatory Visit: Payer: PPO | Admitting: Family Medicine

## 2016-12-24 ENCOUNTER — Encounter: Payer: Self-pay | Admitting: Family Medicine

## 2016-12-24 ENCOUNTER — Ambulatory Visit (INDEPENDENT_AMBULATORY_CARE_PROVIDER_SITE_OTHER): Payer: PPO | Admitting: Family Medicine

## 2016-12-24 VITALS — BP 110/70 | HR 60 | Temp 98.2°F | Wt 172.7 lb

## 2016-12-24 DIAGNOSIS — M545 Low back pain, unspecified: Secondary | ICD-10-CM

## 2016-12-24 DIAGNOSIS — Z23 Encounter for immunization: Secondary | ICD-10-CM | POA: Diagnosis not present

## 2016-12-24 NOTE — Progress Notes (Signed)
Pre visit review using our clinic review tool, if applicable. No additional management support is needed unless otherwise documented below in the visit note. 

## 2016-12-24 NOTE — Patient Instructions (Signed)
Low Back Strain Rehab  Ask your health care provider which exercises are safe for you. Do exercises exactly as told by your health care provider and adjust them as directed. It is normal to feel mild stretching, pulling, tightness, or discomfort as you do these exercises, but you should stop right away if you feel sudden pain or your pain gets worse. Do not begin these exercises until told by your health care provider.  Stretching and range of motion exercises  These exercises warm up your muscles and joints and improve the movement and flexibility of your back. These exercises also help to relieve pain, numbness, and tingling.  Exercise A: Single knee to chest     1. Lie on your back on a firm surface with both legs straight.  2. Bend one of your knees. Use your hands to move your knee up toward your chest until you feel a gentle stretch in your lower back and buttock.  ? Hold your leg in this position by holding onto the front of your knee.  ? Keep your other leg as straight as possible.  3. Hold for __________ seconds.  4. Slowly return to the starting position.  5. Repeat with your other leg.  Repeat __________ times. Complete this exercise __________ times a day.  Exercise B: Prone extension on elbows     1. Lie on your abdomen on a firm surface.  2. Prop yourself up on your elbows.  3. Use your arms to help lift your chest up until you feel a gentle stretch in your abdomen and your lower back.  ? This will place some of your body weight on your elbows. If this is uncomfortable, try stacking pillows under your chest.  ? Your hips should stay down, against the surface that you are lying on. Keep your hip and back muscles relaxed.  4. Hold for __________ seconds.  5. Slowly relax your upper body and return to the starting position.  Repeat __________ times. Complete this exercise __________ times a day.  Strengthening exercises  These exercises build strength and endurance in your back. Endurance is the ability  to use your muscles for a long time, even after they get tired.  Exercise C: Pelvic tilt   1. Lie on your back on a firm surface. Bend your knees and keep your feet flat.  2. Tense your abdominal muscles. Tip your pelvis up toward the ceiling and flatten your lower back into the floor.  ? To help with this exercise, you may place a small towel under your lower back and try to push your back into the towel.  3. Hold for __________ seconds.  4. Let your muscles relax completely before you repeat this exercise.  Repeat __________ times. Complete this exercise __________ times a day.  Exercise D: Alternating arm and leg raises     1. Get on your hands and knees on a firm surface. If you are on a hard floor, you may want to use padding to cushion your knees, such as an exercise mat.  2. Line up your arms and legs. Your hands should be below your shoulders, and your knees should be below your hips.  3. Lift your left leg behind you. At the same time, raise your right arm and straighten it in front of you.  ? Do not lift your leg higher than your hip.  ? Do not lift your arm higher than your shoulder.  ? Keep your abdominal and back   muscles tight.  ? Keep your hips facing the ground.  ? Do not arch your back.  ? Keep your balance carefully, and do not hold your breath.  4. Hold for __________ seconds.  5. Slowly return to the starting position and repeat with your right leg and your left arm.  Repeat __________ times. Complete this exercise __________times a day.  Exercise J: Single leg lower with bent knees   1. Lie on your back on a firm surface.  2. Tense your abdominal muscles and lift your feet off the floor, one foot at a time, so your knees and hips are bent in an "L" shape (at about 90 degrees).  ? Your knees should be over your hips and your lower legs should be parallel to the floor.  3. Keeping your abdominal muscles tense and your knee bent, slowly lower one of your legs so your toe touches the ground.  4. Lift  your leg back up to return to the starting position.  ? Do not hold your breath.  ? Do not let your back arch. Keep your back flat against the ground.  5. Repeat with your other leg.  Repeat __________ times. Complete this exercise __________ times a day.  Posture and body mechanics     Body mechanics refers to the movements and positions of your body while you do your daily activities. Posture is part of body mechanics. Good posture and healthy body mechanics can help to relieve stress in your body's tissues and joints. Good posture means that your spine is in its natural S-curve position (your spine is neutral), your shoulders are pulled back slightly, and your head is not tipped forward. The following are general guidelines for applying improved posture and body mechanics to your everyday activities.  Standing     · When standing, keep your spine neutral and your feet about hip-width apart. Keep a slight bend in your knees. Your ears, shoulders, and hips should line up.  · When you do a task in which you stand in one place for a long time, place one foot up on a stable object that is 2-4 inches (5-10 cm) high, such as a footstool. This helps keep your spine neutral.  Sitting     · When sitting, keep your spine neutral and keep your feet flat on the floor. Use a footrest, if necessary, and keep your thighs parallel to the floor. Avoid rounding your shoulders, and avoid tilting your head forward.  · When working at a desk or a computer, keep your desk at a height where your hands are slightly lower than your elbows. Slide your chair under your desk so you are close enough to maintain good posture.  · When working at a computer, place your monitor at a height where you are looking straight ahead and you do not have to tilt your head forward or downward to look at the screen.  Resting     · When lying down and resting, avoid positions that are most painful for you.  · If you have pain with activities such as sitting,  bending, stooping, or squatting (flexion-based activities), lie in a position in which your body does not bend very much. For example, avoid curling up on your side with your arms and knees near your chest (fetal position).  · If you have pain with activities such as standing for a long time or reaching with your arms (extension-based activities), lie with your spine in a   neutral position and bend your knees slightly. Try the following positions:  ? Lying on your side with a pillow between your knees.  ? Lying on your back with a pillow under your knees.  Lifting     · When lifting objects, keep your feet at least shoulder-width apart and tighten your abdominal muscles.  · Bend your knees and hips and keep your spine neutral. It is important to lift using the strength of your legs, not your back. Do not lock your knees straight out.  · Always ask for help to lift heavy or awkward objects.  This information is not intended to replace advice given to you by your health care provider. Make sure you discuss any questions you have with your health care provider.  Document Released: 09/27/2005 Document Revised: 06/03/2016 Document Reviewed: 07/09/2015  [employer] Interactive Patient Education © 2017 [employer]

## 2016-12-24 NOTE — Progress Notes (Signed)
Subjective:     Patient ID: Laura Farrell, female   DOB: Jan 08, 1946, 71 y.o.   MRN: [id number]  HPI Patient seen with lower back pain. This is actually bilateral upper lumbar area for past month. She's had somewhat similar pains in the past. Denies any injury. Pain is a dull ache which is mild severity and worse after prolonged periods of standing. No difficulties with ambulation. No radiculopathy symptoms. She noticed pain is better with applying pressure. She tried some topical sports cream on one occasion which seemed to help. She's not tried any heat. No dysuria. No fevers or chills. No skin rash.  Patient inquiring about Prevnar 13. Her immunization list states that she had Prevnar 13 2005 but we were not giving this at that point.   Past Medical History:  Diagnosis Date  . Diabetes mellitus   . Headache(784.0)   . HLP (hyperkeratosis lenticularis perstans)   . Hot flashes   . Hyperlipidemia   . Nephrolithiasis   . Over weight   . Thyroid disease   . Urine incontinence    Past Surgical History:  Procedure Laterality Date  . ABDOMINAL HYSTERECTOMY    . APPENDECTOMY    . CATARACT EXTRACTION Right 2017  . cornea implant    . left shoulder    . lower back surgery    . [place]  2005  . tubal ligation  1980  . VESICOVAGINAL FISTULA CLOSURE W/ TAH  1995    reports that she has never smoked. She has never used smokeless tobacco. She reports that she does not drink alcohol or use drugs. family history includes Cancer in her father; Pancreatic cancer in her mother; Tremor in her brother, father, mother, and sister. No Known Allergies   Review of Systems  Constitutional: Negative for appetite change, chills, fever and unexpected weight change.  Respiratory: Negative for shortness of breath.   Cardiovascular: Negative for chest pain.  Gastrointestinal: Negative for abdominal pain.  Genitourinary: Negative for dysuria.  Musculoskeletal: Positive for back pain.  Skin:  Negative for rash.       Objective:   Physical Exam  Constitutional: She appears well-developed and well-nourished.  Cardiovascular: Normal rate and regular rhythm.   Pulmonary/Chest: Effort normal and breath sounds normal. No respiratory distress. She has no wheezes. She has no rales.  Musculoskeletal: She exhibits no edema.  Straight leg raise is negative. Minimal upper lumbar muscular tenderness bilaterally. No spinal tenderness.  Neurological:  Symmetric reflexes ankle and knee bilaterally Full-strength lower extremities       Assessment:     Bilateral lumbar back pain without sciatica features and nonfocal exam. Suspect muscular    Plan:     -We reviewed some extension stretches. -Consider heat for symptomatic relief -Consider physical therapy if not improved in a couple weeks -Avoid muscle relaxers given her age -Prevnar 52 given  [name] MD [facility]

## 2017-02-16 DIAGNOSIS — R251 Tremor, unspecified: Secondary | ICD-10-CM | POA: Insufficient documentation

## 2017-02-16 DIAGNOSIS — M5416 Radiculopathy, lumbar region: Secondary | ICD-10-CM | POA: Diagnosis not present

## 2017-02-16 DIAGNOSIS — M4310 Spondylolisthesis, site unspecified: Secondary | ICD-10-CM | POA: Insufficient documentation

## 2017-02-16 DIAGNOSIS — M4126 Other idiopathic scoliosis, lumbar region: Secondary | ICD-10-CM | POA: Diagnosis not present

## 2017-02-16 DIAGNOSIS — M545 Low back pain: Secondary | ICD-10-CM | POA: Diagnosis not present

## 2017-02-16 HISTORY — DX: Tremor, unspecified: R25.1

## 2017-02-16 HISTORY — DX: Spondylolisthesis, site unspecified: M43.10

## 2017-03-05 DIAGNOSIS — M4310 Spondylolisthesis, site unspecified: Secondary | ICD-10-CM | POA: Diagnosis not present

## 2017-03-08 DIAGNOSIS — M5126 Other intervertebral disc displacement, lumbar region: Secondary | ICD-10-CM | POA: Diagnosis not present

## 2017-03-08 DIAGNOSIS — M4126 Other idiopathic scoliosis, lumbar region: Secondary | ICD-10-CM | POA: Diagnosis not present

## 2017-03-09 DIAGNOSIS — Z947 Corneal transplant status: Secondary | ICD-10-CM | POA: Diagnosis not present

## 2017-03-09 DIAGNOSIS — Z961 Presence of intraocular lens: Secondary | ICD-10-CM | POA: Diagnosis not present

## 2017-04-04 ENCOUNTER — Other Ambulatory Visit: Payer: Self-pay | Admitting: Neurology

## 2017-04-06 DIAGNOSIS — M4126 Other idiopathic scoliosis, lumbar region: Secondary | ICD-10-CM | POA: Diagnosis not present

## 2017-04-06 DIAGNOSIS — M545 Low back pain: Secondary | ICD-10-CM | POA: Diagnosis not present

## 2017-04-06 DIAGNOSIS — R251 Tremor, unspecified: Secondary | ICD-10-CM | POA: Diagnosis not present

## 2017-04-06 DIAGNOSIS — M4310 Spondylolisthesis, site unspecified: Secondary | ICD-10-CM | POA: Diagnosis not present

## 2017-04-20 DIAGNOSIS — M5416 Radiculopathy, lumbar region: Secondary | ICD-10-CM | POA: Diagnosis not present
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
## 2017-04-26 DIAGNOSIS — M5416 Radiculopathy, lumbar region: Secondary | ICD-10-CM | POA: Diagnosis not present

## 2017-04-27 ENCOUNTER — Encounter: Payer: Self-pay | Admitting: Neurology

## 2017-04-27 ENCOUNTER — Ambulatory Visit (INDEPENDENT_AMBULATORY_CARE_PROVIDER_SITE_OTHER): Payer: PPO | Admitting: Neurology

## 2017-04-27 VITALS — BP 116/62 | HR 66 | Ht 64.0 in | Wt 161.0 lb

## 2017-04-27 DIAGNOSIS — G4440 Drug-induced headache, not elsewhere classified, not intractable: Secondary | ICD-10-CM | POA: Diagnosis not present

## 2017-04-27 DIAGNOSIS — G250 Essential tremor: Secondary | ICD-10-CM | POA: Diagnosis not present

## 2017-04-27 MED ORDER — METHYLPREDNISOLONE 4 MG PO TBPK: ORAL_TABLET | ORAL | 0 refills | Status: DC

## 2017-04-28 ENCOUNTER — Encounter: Payer: Self-pay | Admitting: Family Medicine

## 2017-04-28 DIAGNOSIS — M5416 Radiculopathy, lumbar region: Secondary | ICD-10-CM | POA: Diagnosis not present

## 2017-05-02 DIAGNOSIS — M5416 Radiculopathy, lumbar region: Secondary | ICD-10-CM | POA: Diagnosis not present

## 2017-05-05 DIAGNOSIS — M5416 Radiculopathy, lumbar region: Secondary | ICD-10-CM | POA: Diagnosis not present

## 2017-05-09 DIAGNOSIS — M5416 Radiculopathy, lumbar region: Secondary | ICD-10-CM | POA: Diagnosis not present

## 2017-05-13 DIAGNOSIS — M5416 Radiculopathy, lumbar region: Secondary | ICD-10-CM | POA: Diagnosis not present

## 2017-05-31 ENCOUNTER — Encounter: Payer: Self-pay | Admitting: Family Medicine

## 2017-05-31 ENCOUNTER — Ambulatory Visit (INDEPENDENT_AMBULATORY_CARE_PROVIDER_SITE_OTHER): Payer: PPO | Admitting: Family Medicine

## 2017-05-31 VITALS — BP 110/78 | HR 68 | Temp 98.3°F | Wt 160.4 lb

## 2017-05-31 DIAGNOSIS — R5383 Other fatigue: Secondary | ICD-10-CM | POA: Diagnosis not present

## 2017-05-31 DIAGNOSIS — E875 Hyperkalemia: Secondary | ICD-10-CM | POA: Diagnosis not present

## 2017-05-31 DIAGNOSIS — Z23 Encounter for immunization: Secondary | ICD-10-CM

## 2017-05-31 DIAGNOSIS — E119 Type 2 diabetes mellitus without complications: Secondary | ICD-10-CM | POA: Diagnosis not present

## 2017-05-31 DIAGNOSIS — E038 Other specified hypothyroidism: Secondary | ICD-10-CM

## 2017-05-31 NOTE — Patient Instructions (Signed)

## 2017-05-31 NOTE — Progress Notes (Signed)
Subjective:     Patient ID: Laura Farrell, female   DOB: Jan 30, 1946, 71 y.o.   MRN: [id number]  HPI Patient has chronic problems including history of mild obstructive sleep apnea, GERD, type 2 diabetes, hypothyroidism, essential tremor who is seen today with 3-4 week history of some progressive fatigue. Symptoms possibly longer. She has noted recently more daytime somnolence. Sometimes taking 2 hour naps per day. She does have occasional nocturia usually about twice per night but usually gets about 9 hours of sleep per night.  She had sleep study back in 2013 which showed mild sleep apnea. She has lost some weight since then. She has lost about 12 pounds since she was here in March but this is due to her efforts. Good appetite. She has had some mild dyspnea with exertion recently but no chest pain. She is on several medications that could contract for fatigue including prone, Inderal, gabapentin but is been on these for quite some time. She has hypothyroidism and is overdue for lab work. Also no recent A1c. Blood sugars have been stable. No polyuria or polydipsia  Past Medical History:  Diagnosis Date  . Diabetes mellitus   . Headache(784.0)   . HLP (hyperkeratosis lenticularis perstans)   . Hot flashes   . Hyperlipidemia   . Nephrolithiasis   . Over weight   . Thyroid disease   . Urine incontinence    Past Surgical History:  Procedure Laterality Date  . ABDOMINAL HYSTERECTOMY    . APPENDECTOMY    . CATARACT EXTRACTION Right 2017  . cornea implant    . left shoulder    . lower back surgery    . [place]  2005  . tubal ligation  1980  . VESICOVAGINAL FISTULA CLOSURE W/ TAH  1995    reports that she has never smoked. She has never used smokeless tobacco. She reports that she does not drink alcohol or use drugs. family history includes Cancer in her father; Pancreatic cancer in her mother; Tremor in her brother, father, mother, and sister. No Known Allergies   Review of Systems   Constitutional: Positive for fatigue. Negative for appetite change and unexpected weight change.  HENT: Negative for congestion.   Respiratory: Negative for cough, shortness of breath and wheezing.   Cardiovascular: Negative for chest pain.  Gastrointestinal: Negative for abdominal pain, diarrhea, nausea and vomiting.  Genitourinary: Negative for dysuria.  Skin: Negative for rash.  Neurological: Negative for dizziness and weakness.  Hematological: Negative for adenopathy. Does not bruise/bleed easily.  Psychiatric/Behavioral: Negative for dysphoric mood.       Objective:   Physical Exam  Constitutional: She is oriented to person, place, and time. She appears well-developed and well-nourished. No distress.  HENT:  Mouth/Throat: Oropharynx is clear and moist.  Neck: Neck supple. No thyromegaly present.  Cardiovascular: Normal rate and regular rhythm.  Exam reveals no gallop.   Faint systolic murmur over aortic area  Pulmonary/Chest: Effort normal and breath sounds normal. No respiratory distress. She has no wheezes. She has no rales.  Abdominal: Soft. She exhibits no mass. There is no tenderness. There is no rebound and no guarding.  Musculoskeletal: She exhibits no edema.  Lymphadenopathy:    She has no cervical adenopathy.  Neurological: She is alert and oriented to person, place, and time. No cranial nerve deficit.  Skin: No rash noted.  Psychiatric: She has a normal mood and affect. Her behavior is normal.       Assessment:  Patient presents with 3-4 week history of some fatigue and daytime somnolence which she states is new. She does have history of obstructive sleep apnea but relatively mild by studies 5 years ago. No recent observed apnea  Question multifactorial-sleep disruption from nocturia, mild sleep apnea, medications, versus other    Plan:     -Start with further labs with CBC, chemistries, TSH, A1c -Avoid caffeine at night or in the afternoons and avoid  excessive fluids late in the day -Consider follow-up sleep study for any progressive symptoms -Consider echocardiogram for any exertional dyspnea  [name] MD [facility]

## 2017-06-01 LAB — HEPATIC FUNCTION PANEL
ALBUMIN: 4.6 g/dL (ref 3.5–5.2)
ALT: 22 U/L (ref 0–35)
AST: 21 U/L (ref 0–37)
Alkaline Phosphatase: 57 U/L (ref 39–117)
BILIRUBIN TOTAL: 0.2 mg/dL (ref 0.2–1.2)
Bilirubin, Direct: 0 mg/dL (ref 0.0–0.3)
TOTAL PROTEIN: 7.1 g/dL (ref 6.0–8.3)

## 2017-06-01 LAB — CBC WITH DIFFERENTIAL/PLATELET
BASOS PCT: 0.9 % (ref 0.0–3.0)
Basophils Absolute: 0 10*3/uL (ref 0.0–0.1)
EOS ABS: 0.1 10*3/uL (ref 0.0–0.7)
Eosinophils Relative: 1.7 % (ref 0.0–5.0)
HCT: 39.1 % (ref 36.0–46.0)
Hemoglobin: 12.9 g/dL (ref 12.0–15.0)
LYMPHS PCT: 49.1 % — AB (ref 12.0–46.0)
Lymphs Abs: 2.7 10*3/uL (ref 0.7–4.0)
MCHC: 33 g/dL (ref 30.0–36.0)
MCV: 93.8 fl (ref 78.0–100.0)
Monocytes Absolute: 0.3 10*3/uL (ref 0.1–1.0)
Monocytes Relative: 6 % (ref 3.0–12.0)
NEUTROS ABS: 2.3 10*3/uL (ref 1.4–7.7)
Neutrophils Relative %: 42.3 % — ABNORMAL LOW (ref 43.0–77.0)
PLATELETS: 178 10*3/uL (ref 150.0–400.0)
RBC: 4.16 Mil/uL (ref 3.87–5.11)
RDW: 13.3 % (ref 11.5–15.5)
WBC: 5.5 10*3/uL (ref 4.0–10.5)

## 2017-06-01 LAB — BASIC METABOLIC PANEL
BUN: 20 mg/dL (ref 6–23)
CHLORIDE: 105 meq/L (ref 96–112)
CO2: 30 meq/L (ref 19–32)
CREATININE: 0.98 mg/dL (ref 0.40–1.20)
Calcium: 10.4 mg/dL (ref 8.4–10.5)
GFR: 59.05 mL/min — ABNORMAL LOW (ref >60.00)
Glucose, Bld: 107 mg/dL — ABNORMAL HIGH (ref 70–99)
Potassium: 5.7 mEq/L — ABNORMAL HIGH (ref 3.5–5.1)
Sodium: 141 mEq/L (ref 135–145)

## 2017-06-01 LAB — TSH: TSH: 2.87 u[IU]/mL (ref 0.35–4.50)

## 2017-06-01 LAB — HEMOGLOBIN A1C: HEMOGLOBIN A1C: 5.9 % (ref 4.6–6.5)

## 2017-06-02 ENCOUNTER — Telehealth: Payer: Self-pay | Admitting: Family Medicine

## 2017-06-02 NOTE — Telephone Encounter (Signed)
° ° °  Pt would like a call back about her lab results

## 2017-06-02 NOTE — Telephone Encounter (Signed)
I left a message for the pt to return my call. 

## 2017-06-03 NOTE — Telephone Encounter (Signed)
Pt is calling back

## 2017-06-03 NOTE — Addendum Note (Signed)
Addended by: Agnes Lawrence on: 06/03/2017 04:23 PM   Modules accepted: Orders

## 2017-06-03 NOTE — Telephone Encounter (Signed)
Laura Farrell spoke with patient.

## 2017-06-07 ENCOUNTER — Other Ambulatory Visit: Payer: PPO

## 2017-06-07 ENCOUNTER — Other Ambulatory Visit: Payer: Self-pay | Admitting: Neurology

## 2017-06-08 ENCOUNTER — Other Ambulatory Visit (INDEPENDENT_AMBULATORY_CARE_PROVIDER_SITE_OTHER): Payer: PPO

## 2017-06-08 DIAGNOSIS — E875 Hyperkalemia: Secondary | ICD-10-CM | POA: Diagnosis not present

## 2017-06-08 LAB — BASIC METABOLIC PANEL
BUN: 15 mg/dL (ref 6–23)
CHLORIDE: 107 meq/L (ref 96–112)
CO2: 30 meq/L (ref 19–32)
Calcium: 9.7 mg/dL (ref 8.4–10.5)
Creatinine, Ser: 0.93 mg/dL (ref 0.40–1.20)
GFR: 62.72 mL/min (ref >60.00)
Glucose, Bld: 102 mg/dL — ABNORMAL HIGH (ref 70–99)
POTASSIUM: 4.6 meq/L (ref 3.5–5.1)
Sodium: 142 mEq/L (ref 135–145)

## 2017-06-10 ENCOUNTER — Ambulatory Visit: Payer: PPO | Admitting: Family Medicine

## 2017-10-24 ENCOUNTER — Other Ambulatory Visit: Payer: Self-pay

## 2017-10-24 MED ORDER — LEVOTHYROXINE SODIUM 75 MCG PO TABS: 75.0000 ug | ORAL_TABLET | Freq: Every day | ORAL | 0 refills | Status: DC

## 2017-10-25 ENCOUNTER — Other Ambulatory Visit: Payer: Self-pay | Admitting: *Deleted

## 2017-10-25 MED ORDER — LEVOTHYROXINE SODIUM 75 MCG PO TABS: 75.0000 ug | ORAL_TABLET | Freq: Every day | ORAL | 0 refills | Status: DC

## 2017-10-26 ENCOUNTER — Other Ambulatory Visit: Payer: Self-pay | Admitting: Family Medicine
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
## 2017-11-02 ENCOUNTER — Encounter: Payer: Self-pay | Admitting: Neurology

## 2017-11-02 ENCOUNTER — Ambulatory Visit: Payer: PPO | Admitting: Neurology

## 2017-11-02 VITALS — BP 100/70 | HR 70 | Ht 64.0 in | Wt 170.0 lb

## 2017-11-02 DIAGNOSIS — G250 Essential tremor: Secondary | ICD-10-CM | POA: Diagnosis not present

## 2017-11-09 ENCOUNTER — Other Ambulatory Visit: Payer: Self-pay | Admitting: Family Medicine

## 2017-11-09 DIAGNOSIS — Z1231 Encounter for screening mammogram for malignant neoplasm of breast: Secondary | ICD-10-CM

## 2017-11-15 ENCOUNTER — Other Ambulatory Visit: Payer: Self-pay | Admitting: Family Medicine

## 2017-11-18 ENCOUNTER — Other Ambulatory Visit: Payer: Self-pay | Admitting: Neurology

## 2017-11-23 ENCOUNTER — Ambulatory Visit (INDEPENDENT_AMBULATORY_CARE_PROVIDER_SITE_OTHER): Payer: PPO | Admitting: Family Medicine

## 2017-11-23 ENCOUNTER — Encounter: Payer: Self-pay | Admitting: Family Medicine

## 2017-11-23 VITALS — BP 110/70 | HR 53 | Temp 97.9°F | Wt 165.4 lb

## 2017-11-23 DIAGNOSIS — M5416 Radiculopathy, lumbar region: Secondary | ICD-10-CM

## 2017-11-23 DIAGNOSIS — A084 Viral intestinal infection, unspecified: Secondary | ICD-10-CM | POA: Diagnosis not present

## 2017-11-23 NOTE — Progress Notes (Signed)
Subjective:     Patient ID: Laura Farrell, female   DOB: Mar 03, 1946, 72 y.o.   MRN: [id number]  HPI Patient states she developed "head cold "last week. Those symptoms seem to be improving and then yesterday she had onset of nausea, vomiting, diarrhea. She had only one episode of vomiting but had fairly explosive nonbloody diarrhea from yesterday morning until 7 AM today -she estimates 25 episodes. No recent antibiotics. No recent travels. Has kept down some fluids. Was able to eat a little yesterday but unfortunately ate things like oranges and sausage biscuit which may have exacerbated her diarrhea.  No fevers or chills. She has some intermittent cramp-like pains lower abdomen today but diarrhea much improved. Did not take any Imodium.  Second issue is intermittent pain which seems to radiate from her right buttock area down right lower extremity lateral and posterior sometimes to the leg. No numbness. No weakness. No urine or stool incontinence. Symptoms any worse at night. No injury. Symptoms present for several weeks. Not progressive. Relatively mild. No exacerbating or alleviating factors  Past Medical History:  Diagnosis Date  . Diabetes mellitus   . Headache(784.0)   . HLP (hyperkeratosis lenticularis perstans)   . Hot flashes   . Hyperlipidemia   . Nephrolithiasis   . Over weight   . Thyroid disease   . Urine incontinence    Past Surgical History:  Procedure Laterality Date  . ABDOMINAL HYSTERECTOMY    . APPENDECTOMY    . CATARACT EXTRACTION Right 2017  . cornea implant    . left shoulder    . lower back surgery    . [place]  2005  . tubal ligation  1980  . VESICOVAGINAL FISTULA CLOSURE W/ TAH  1995    reports that  has never smoked. she has never used smokeless tobacco. She reports that she does not drink alcohol or use drugs. family history includes Cancer in her father; Pancreatic cancer in her mother; Tremor in her brother, father, mother, and sister. No Known  Allergies   Review of Systems  Constitutional: Positive for fatigue. Negative for chills and fever.  Respiratory: Negative for shortness of breath.   Cardiovascular: Negative for chest pain.  Gastrointestinal: Positive for abdominal pain and diarrhea. Negative for abdominal distention and blood in stool.  Genitourinary: Negative for dysuria.  Neurological: Negative for dizziness and weakness.       Objective:   Physical Exam  Constitutional: She appears well-developed and well-nourished.  Cardiovascular: Normal rate and regular rhythm.  Pulmonary/Chest: Effort normal and breath sounds normal. No respiratory distress. She has no wheezes. She has no rales.  Abdominal: Soft. Bowel sounds are normal. She exhibits no distension and no mass. There is no tenderness. There is no rebound and no guarding.  Musculoskeletal:  Straight leg raises are negative bilaterally No leg edema. Good range of motion both hips No lateral hip tenderness  Neurological:  Full-strength lower extremities. Symmetric reflexes. Normal sensory function throughout.       Assessment:     #1 probable gastroenteritis. Nausea has resolved and diarrhea has greatly improved. Does not appear clinically dehydrated  #2 right lower extremity pains. Question lumbar radiculitis. No evidence for bursitis and doubt osteoarthritis of hip. She has excellent range of motion    Plan:     -Discussed appropriate food choices for diarrhea -Handout given for high potassium foods for the next few days with her recent diarrhea -Consider short-term use of Imodium if she has any recurrent  explosive diarrhea today -Follow-up for any bloody stools, fever, or other new symptoms -Observation regarding her right lower extremity pain since this is intermittent and relatively mild. Follow-up immediately for any numbness, weakness, or progressive pain  Laura Post MD [facility]

## 2017-11-23 NOTE — Patient Instructions (Signed)
Potassium Content of Foods Potassium is a mineral found in many foods and drinks. It helps keep fluids and minerals balanced in your body and affects how steadily your heart beats. Potassium also helps control your blood pressure and keep your muscles and nervous system healthy. Certain health conditions and medicines may change the balance of potassium in your body. When this happens, you can help balance your level of potassium through the foods that you do or do not eat. Your health care provider or dietitian may recommend an amount of potassium that you should have each day. The following lists of foods provide the amount of potassium (in parentheses) per serving in each item. High in potassium The following foods and beverages have 200 mg or more of potassium per serving:  Apricots, 2 raw or 5 dry (200 mg).  Artichoke, 1 medium (345 mg).  Avocado, raw,  each (245 mg).  Banana, 1 medium (425 mg).  Beans, lima, or baked beans, canned,  cup (280 mg).  Beans, white, canned,  cup (595 mg).  Beef roast, 3 oz (320 mg).  Beef, ground, 3 oz (270 mg).  Beets, raw or cooked,  cup (260 mg).  Bran muffin, 2 oz (300 mg).  Broccoli,  cup (230 mg).  Brussels sprouts,  cup (250 mg).  Cantaloupe,  cup (215 mg).  Cereal, 100% bran,  cup (200-400 mg).  Cheeseburger, single, fast food, 1 each (225-400 mg).  Chicken, 3 oz (220 mg).  Clams, canned, 3 oz (535 mg).  Crab, 3 oz (225 mg).  Dates, 5 each (270 mg).  Dried beans and peas,  cup (300-475 mg).  Figs, dried, 2 each (260 mg).  Fish: halibut, tuna, cod, snapper, 3 oz (480 mg).  Fish: salmon, haddock, swordfish, perch, 3 oz (300 mg).  Fish, tuna, canned 3 oz (200 mg).  [place] fries, fast food, 3 oz (470 mg).  Granola with fruit and nuts,  cup (200 mg).  Grapefruit juice,  cup (200 mg).  Greens, beet,  cup (655 mg).  Honeydew melon,  cup (200 mg).  Kale, raw, 1 cup (300 mg).  Kiwi, 1 medium (240  mg).  Kohlrabi, rutabaga, parsnips,  cup (280 mg).  Lentils,  cup (365 mg).  Mango, 1 each (325 mg).  Milk, chocolate, 1 cup (420 mg).  Milk: nonfat, low-fat, whole, buttermilk, 1 cup (350-380 mg).  Molasses, 1 Tbsp (295 mg).  Mushrooms,  cup (280) mg.  Nectarine, 1 each (275 mg).  Nuts: almonds, peanuts, hazelnuts, [place], cashew, mixed, 1 oz (200 mg).  Nuts, pistachios, 1 oz (295 mg).  Orange, 1 each (240 mg).  Orange juice,  cup (235 mg).  Papaya, medium,  fruit (390 mg).  Peanut butter, chunky, 2 Tbsp (240 mg).  Peanut butter, smooth, 2 Tbsp (210 mg).  Pear, 1 medium (200 mg).  Pomegranate, 1 whole (400 mg).  Pomegranate juice,  cup (215 mg).  Pork, 3 oz (350 mg).  Potato chips, salted, 1 oz (465 mg).  Potato, baked with skin, 1 medium (925 mg).  Potatoes, boiled,  cup (255 mg).  Potatoes, mashed,  cup (330 mg).  Prune juice,  cup (370 mg).  Prunes, 5 each (305 mg).  Pudding, chocolate,  cup (230 mg).  Pumpkin, canned,  cup (250 mg).  Raisins, seedless,  cup (270 mg).  Seeds, sunflower or pumpkin, 1 oz (240 mg).  Soy milk, 1 cup (300 mg).  Spinach,  cup (420 mg).  Spinach, canned,  cup (370 mg).  Sweet  potato, baked with skin, 1 medium (450 mg).  Swiss chard,  cup (480 mg).  Tomato or vegetable juice,  cup (275 mg).  Tomato sauce or puree,  cup (400-550 mg).  Tomato, raw, 1 medium (290 mg).  Tomatoes, canned,  cup (200-300 mg).  [place], 3 oz (250 mg).  Wheat germ, 1 oz (250 mg).  Winter squash,  cup (250 mg).  Yogurt, plain or fruited, 6 oz (260-435 mg).  Zucchini,  cup (220 mg).  Moderate in potassium The following foods and beverages have 50-200 mg of potassium per serving:  Apple, 1 each (150 mg).  Apple juice,  cup (150 mg).  Applesauce,  cup (90 mg).  Apricot nectar,  cup (140 mg).  Asparagus, small spears,  cup or 6 spears (155 mg).  Bagel, cinnamon raisin, 1 each (130 mg).  Bagel,  egg or plain, 4 in., 1 each (70 mg).  Beans, green,  cup (90 mg).  Beans, yellow,  cup (190 mg).  Beer, regular, 12 oz (100 mg).  Beets, canned,  cup (125 mg).  Blackberries,  cup (115 mg).  Blueberries,  cup (60 mg).  Bread, whole wheat, 1 slice (70 mg).  Broccoli, raw,  cup (145 mg).  Cabbage,  cup (150 mg).  Carrots, cooked or raw,  cup (180 mg).  Cauliflower, raw,  cup (150 mg).  Celery, raw,  cup (155 mg).  Cereal, bran flakes, cup (120-150 mg).  Cheese, cottage,  cup (110 mg).  Cherries, 10 each (150 mg).  Chocolate, 1 oz bar (165 mg).  Coffee, brewed 6 oz (90 mg).  Corn,  cup or 1 ear (195 mg).  Cucumbers,  cup (80 mg).  Egg, large, 1 each (60 mg).  Eggplant,  cup (60 mg).  Endive, raw, cup (80 mg).  English muffin, 1 each (65 mg).  Fish, orange roughy, 3 oz (150 mg).  Frankfurter, beef or pork, 1 each (75 mg).  Fruit cocktail,  cup (115 mg).  Grape juice,  cup (170 mg).  Grapefruit,  fruit (175 mg).  Grapes,  cup (155 mg).  Greens: kale, turnip, collard,  cup (110-150 mg).  Ice cream or frozen yogurt, chocolate,  cup (175 mg).  Ice cream or frozen yogurt, vanilla,  cup (120-150 mg).  Lemons, limes, 1 each (80 mg).  Lettuce, all types, 1 cup (100 mg).  Mixed vegetables,  cup (150 mg).  Mushrooms, raw,  cup (110 mg).  Nuts: walnuts, pecans, or macadamia, 1 oz (125 mg).  Oatmeal,  cup (80 mg).  Okra,  cup (110 mg).  Onions, raw,  cup (120 mg).  Peach, 1 each (185 mg).  Peaches, canned,  cup (120 mg).  Pears, canned,  cup (120 mg).  Peas, green, frozen,  cup (90 mg).  Peppers, green,  cup (130 mg).  Peppers, red,  cup (160 mg).  Pineapple juice,  cup (165 mg).  Pineapple, fresh or canned,  cup (100 mg).  Plums, 1 each (105 mg).  Pudding, vanilla,  cup (150 mg).  Raspberries,  cup (90 mg).  Rhubarb,  cup (115 mg).  Rice, wild,  cup (80 mg).  Shrimp, 3 oz (155  mg).  Spinach, raw, 1 cup (170 mg).  Strawberries,  cup (125 mg).  Summer squash  cup (175-200 mg).  Swiss chard, raw, 1 cup (135 mg).  Tangerines, 1 each (140 mg).  Tea, brewed, 6 oz (65 mg).  Turnips,  cup (140 mg).  Watermelon,  cup (85 mg).  Wine, red, table,  5 oz (180 mg).  Wine, white, table, 5 oz (100 mg).  Low in potassium The following foods and beverages have less than 50 mg of potassium per serving.  Bread, white, 1 slice (30 mg).  Carbonated beverages, 12 oz (less than 5 mg).  Cheese, 1 oz (20-30 mg).  Cranberries,  cup (45 mg).  Cranberry juice cocktail,  cup (20 mg).  Fats and oils, 1 Tbsp (less than 5 mg).  Hummus, 1 Tbsp (32 mg).  Nectar: papaya, mango, or pear,  cup (35 mg).  Rice, white or brown,  cup (50 mg).  Spaghetti or macaroni,  cup cooked (30 mg).  Tortilla, flour or corn, 1 each (50 mg).  Waffle, 4 in., 1 each (50 mg).  Water chestnuts,  cup (40 mg).  This information is not intended to replace advice given to you by your health care provider. Make sure you discuss any questions you have with your health care provider. Document Released: 05/11/2005 Document Revised: 03/04/2016 Document Reviewed: 08/24/2013 Elsevier Interactive Patient Education  2018 [place] Choices to Help Relieve Diarrhea, Adult When you have diarrhea, the foods you eat and your eating habits are very important. Choosing the right foods and drinks can help:  Relieve diarrhea.  Replace lost fluids and nutrients.  Prevent dehydration.  What general guidelines should I follow? Relieving diarrhea  Choose foods with less than 2 g or .07 oz. of fiber per serving.  Limit fats to less than 8 tsp (38 g or 1.34 oz.) a day.  Avoid the following: ? Foods and beverages sweetened with high-fructose corn syrup, honey, or sugar alcohols such as xylitol, sorbitol, and mannitol. ? Foods that contain a lot of fat or sugar. ? Fried, greasy, or  spicy foods. ? High-fiber grains, breads, and cereals. ? Raw fruits and vegetables.  Eat foods that are rich in probiotics. These foods include dairy products such as yogurt and fermented milk products. They help increase healthy bacteria in the stomach and intestines (gastrointestinal tract, or GI tract).  If you have lactose intolerance, avoid dairy products. These may make your diarrhea worse.  Take medicine to help stop diarrhea (antidiarrheal medicine) only as told by your health care provider. Replacing nutrients  Eat small meals or snacks every 3-4 hours.  Eat bland foods, such as white rice, toast, or baked potato, until your diarrhea starts to get better. Gradually reintroduce nutrient-rich foods as tolerated or as told by your health care provider. This includes: ? Well-cooked protein foods. ? Peeled, seeded, and soft-cooked fruits and vegetables. ? Low-fat dairy products.  Take vitamin and mineral supplements as told by your health care provider. Preventing dehydration   Start by sipping water or a special solution to prevent dehydration (oral rehydration solution, ORS). Urine that is clear or pale yellow means that you are getting enough fluid.  Try to drink at least 8-10 cups of fluid each day to help replace lost fluids.  You may add other liquids in addition to water, such as clear juice or decaffeinated sports drinks, as tolerated or as told by your health care provider.  Avoid drinks with caffeine, such as coffee, tea, or soft drinks.  Avoid alcohol. What foods are recommended? The items listed may not be a complete list. Talk with your health care provider about what dietary choices are best for you. Grains White rice. White, [place], or pita breads (fresh or toasted), including plain rolls, buns, or bagels. White pasta. Saltine, soda, or graham crackers. Pretzels.  Low-fiber cereal. Cooked cereals made with water (such as cornmeal, farina, or cream cereals). Plain  muffins. Matzo. Melba toast. Zwieback. Vegetables Potatoes (without the skin). Most well-cooked and canned vegetables without skins or seeds. Tender lettuce. Fruits Apple sauce. Fruits canned in juice. Cooked apricots, cherries, grapefruit, peaches, pears, or plums. Fresh bananas and cantaloupe. Meats and other protein foods Baked or boiled chicken. Eggs. Tofu. Fish. Seafood. Smooth nut butters. Ground or well-cooked tender beef, ham, veal, lamb, pork, or poultry. Dairy Plain yogurt, kefir, and unsweetened liquid yogurt. Lactose-free milk, buttermilk, skim milk, or soy milk. Low-fat or nonfat hard cheese. Beverages Water. Low-calorie sports drinks. Fruit juices without pulp. Strained tomato and vegetable juices. Decaffeinated teas. Sugar-free beverages not sweetened with sugar alcohols. Oral rehydration solutions, if approved by your health care provider. Seasoning and other foods Bouillon, broth, or soups made from recommended foods. What foods are not recommended? The items listed may not be a complete list. Talk with your health care provider about what dietary choices are best for you. Grains Whole grain, whole wheat, bran, or rye breads, rolls, pastas, and crackers. Wild or brown rice. Whole grain or bran cereals. Barley. Oats and oatmeal. Corn tortillas or taco shells. Granola. Popcorn. Vegetables Raw vegetables. Fried vegetables. Cabbage, broccoli, Brussels sprouts, artichokes, baked beans, beet greens, corn, kale, legumes, peas, sweet potatoes, and yams. Potato skins. Cooked spinach and cabbage. Fruits Dried fruit, including raisins and dates. Raw fruits. Stewed or dried prunes. Canned fruits with syrup. Meat and other protein foods Fried or fatty meats. Deli meats. Chunky nut butters. Nuts and seeds. Beans and lentils. Berniece Salines. Hot dogs. Sausage. Dairy High-fat cheeses. Whole milk, chocolate milk, and beverages made with milk, such as milk shakes. Half-and-half. Cream. sour cream. Ice  cream. Beverages Caffeinated beverages (such as coffee, tea, soda, or energy drinks). Alcoholic beverages. Fruit juices with pulp. Prune juice. Soft drinks sweetened with high-fructose corn syrup or sugar alcohols. High-calorie sports drinks. Fats and oils Butter. Cream sauces. Margarine. Salad oils. Plain salad dressings. Olives. Avocados. Mayonnaise. Sweets and desserts Sweet rolls, doughnuts, and sweet breads. Sugar-free desserts sweetened with sugar alcohols such as xylitol and sorbitol. Seasoning and other foods Honey. Hot sauce. Chili powder. Gravy. Cream-based or milk-based soups. Pancakes and waffles. Summary  When you have diarrhea, the foods you eat and your eating habits are very important.  Make sure you get at least 8-10 cups of fluid each day, or enough to keep your urine clear or pale yellow.  Eat bland foods and gradually reintroduce healthy, nutrient-rich foods as tolerated, or as told by your health care provider.  Avoid high-fiber, fried, greasy, or spicy foods. This information is not intended to replace advice given to you by your health care provider. Make sure you discuss any questions you have with your health care provider. Document Released: 12/18/2003 Document Revised: 09/24/2016 Document Reviewed: 09/24/2016 Elsevier Interactive Patient Education  [employer].

## 2017-11-25 ENCOUNTER — Other Ambulatory Visit: Payer: Self-pay | Admitting: Family Medicine

## 2017-12-14 ENCOUNTER — Ambulatory Visit
Admission: RE | Admit: 2017-12-14 | Discharge: 2017-12-14 | Disposition: A | Discharge status: Home / Self Care | Payer: PPO | Source: Ambulatory Visit | Attending: Family Medicine | Admitting: Family Medicine

## 2017-12-14 DIAGNOSIS — Z947 Corneal transplant status: Secondary | ICD-10-CM | POA: Diagnosis not present

## 2017-12-14 DIAGNOSIS — L821 Other seborrheic keratosis: Secondary | ICD-10-CM | POA: Diagnosis not present

## 2017-12-14 DIAGNOSIS — Z1231 Encounter for screening mammogram for malignant neoplasm of breast: Secondary | ICD-10-CM | POA: Diagnosis not present

## 2017-12-14 DIAGNOSIS — L570 Actinic keratosis: Secondary | ICD-10-CM | POA: Diagnosis not present

## 2017-12-14 DIAGNOSIS — D229 Melanocytic nevi, unspecified: Secondary | ICD-10-CM | POA: Diagnosis not present

## 2017-12-14 DIAGNOSIS — Z9841 Cataract extraction status, right eye: Secondary | ICD-10-CM | POA: Diagnosis not present

## 2017-12-14 DIAGNOSIS — Z961 Presence of intraocular lens: Secondary | ICD-10-CM | POA: Diagnosis not present

## 2018-01-02 DIAGNOSIS — Z9841 Cataract extraction status, right eye: Secondary | ICD-10-CM | POA: Diagnosis not present

## 2018-01-02 DIAGNOSIS — Z961 Presence of intraocular lens: Secondary | ICD-10-CM | POA: Diagnosis not present

## 2018-01-02 DIAGNOSIS — Z947 Corneal transplant status: Secondary | ICD-10-CM | POA: Diagnosis not present

## 2018-01-04 DIAGNOSIS — L570 Actinic keratosis: Secondary | ICD-10-CM | POA: Diagnosis not present

## 2018-01-09 ENCOUNTER — Other Ambulatory Visit: Payer: Self-pay | Admitting: Family Medicine

## 2018-01-09 ENCOUNTER — Encounter: Payer: Self-pay | Admitting: Family Medicine

## 2018-01-09 ENCOUNTER — Ambulatory Visit (INDEPENDENT_AMBULATORY_CARE_PROVIDER_SITE_OTHER): Payer: PPO | Admitting: Family Medicine

## 2018-01-09 VITALS — BP 110/80 | HR 72 | Temp 97.3°F | Wt 171.7 lb

## 2018-01-09 DIAGNOSIS — M549 Dorsalgia, unspecified: Secondary | ICD-10-CM

## 2018-01-09 DIAGNOSIS — M5432 Sciatica, left side: Secondary | ICD-10-CM

## 2018-01-09 MED ORDER — TRAMADOL HCL 50 MG PO TABS: 50.0000 mg | ORAL_TABLET | Freq: Four times a day (QID) | ORAL | 0 refills | Status: DC | PRN

## 2018-01-09 NOTE — Progress Notes (Signed)
Subjective:     Patient ID: Laura Farrell, female   DOB: 03-Mar-1946, 72 y.o.   MRN: [id number]  HPI Patient seen for acute low back pain. Started Saturday which is doing some yardwork. She was moving some flowers apparently twisted and felt pain in her lower back region. She's had some pain radiating from the left lower lumbar region down into the buttock and posterior lateral lower extremity to around the knee. No weakness. No numbness. No urine or stool incontinence. Pain is moderate. No relief with heat  Past Medical History:  Diagnosis Date  . Diabetes mellitus   . Headache(784.0)   . HLP (hyperkeratosis lenticularis perstans)   . Hot flashes   . Hyperlipidemia   . Nephrolithiasis   . Over weight   . Thyroid disease   . Urine incontinence    Past Surgical History:  Procedure Laterality Date  . ABDOMINAL HYSTERECTOMY    . APPENDECTOMY    . BREAST EXCISIONAL BIOPSY Left   . BREAST EXCISIONAL BIOPSY Left   . BREAST EXCISIONAL BIOPSY Left   . CATARACT EXTRACTION Right 2017  . cornea implant    . left shoulder    . lower back surgery    . [place]  2005  . tubal ligation  1980  . VESICOVAGINAL FISTULA CLOSURE W/ TAH  1995    reports that she has never smoked. She has never used smokeless tobacco. She reports that she does not drink alcohol or use drugs. family history includes Breast cancer (age of onset: 29) in her sister; Cancer in her father; Pancreatic cancer in her mother; Tremor in her brother, father, mother, and sister. No Known Allergies   Review of Systems  Constitutional: Negative for chills and fever.  Genitourinary: Negative for dysuria.  Musculoskeletal: Positive for back pain.  Neurological: Negative for weakness and numbness.       Objective:   Physical Exam  Constitutional: She appears well-developed and well-nourished.  Cardiovascular: Normal rate and regular rhythm.  Pulmonary/Chest: Effort normal and breath sounds normal. No respiratory  distress. She has no wheezes. She has no rales.  Musculoskeletal: She exhibits no edema.  Straight leg raise are negative. Minimally tender left lower lumbar region  Neurological:  Full-strength lower extremities with symmetric reflexes knee and ankle bilaterally       Assessment:     Left-sided sciatica with nonfocal neuro exam    Plan:     -Reviewed some simple extension stretches -Refill tramadol 50 mg one every 6 hours as needed for pain #20 with no refill. She can also supplement with Tylenol as needed -Touch if pain not improving over the next couple weeks and sooner for any worsening pain, numbness, or weakness  [name] MD [facility] at [facility]

## 2018-01-09 NOTE — Telephone Encounter (Signed)
We just sent in rx for her this AM-during her visit.

## 2018-01-09 NOTE — Telephone Encounter (Signed)
This is a historical medication.  Okay to fill?

## 2018-01-09 NOTE — Patient Instructions (Signed)
Sciatica Sciatica is pain, numbness, weakness, or tingling along the path of the sciatic nerve. The sciatic nerve starts in the lower back and runs down the back of each leg. The nerve controls the muscles in the lower leg and in the back of the knee. It also provides feeling (sensation) to the back of the thigh, the lower leg, and the sole of the foot. Sciatica is a symptom of another medical condition that pinches or puts pressure on the sciatic nerve. Generally, sciatica only affects one side of the body. Sciatica usually goes away on its own or with treatment. In some cases, sciatica may keep coming back (recur). What are the causes? This condition is caused by pressure on the sciatic nerve, or pinching of the sciatic nerve. This may be the result of:  A disk in between the bones of the spine (vertebrae) bulging out too far (herniated disk).  Age-related changes in the spinal disks (degenerative disk disease).  A pain disorder that affects a muscle in the buttock (piriformis syndrome).  Extra bone growth (bone spur) near the sciatic nerve.  An injury or break (fracture) of the pelvis.  Pregnancy.  Tumor (rare).  What increases the risk? The following factors may make you more likely to develop this condition:  Playing sports that place pressure or stress on the spine, such as football or weight lifting.  Having poor strength and flexibility.  A history of back injury.  A history of back surgery.  Sitting for long periods of time.  Doing activities that involve repetitive bending or lifting.  Obesity.  What are the signs or symptoms? Symptoms can vary from mild to very severe, and they may include:  Any of these problems in the lower back, leg, hip, or buttock: ? Mild tingling or dull aches. ? Burning sensations. ? Sharp pains.  Numbness in the back of the calf or the sole of the foot.  Leg weakness.  Severe back pain that makes movement difficult.  These  symptoms may get worse when you cough, sneeze, or laugh, or when you sit or stand for long periods of time. Being overweight may also make symptoms worse. In some cases, symptoms may recur over time. How is this diagnosed? This condition may be diagnosed based on:  Your symptoms.  A physical exam. Your health care provider may ask you to do certain movements to check whether those movements trigger your symptoms.  You may have tests, including: ? Blood tests. ? X-rays. ? MRI. ? CT scan.  How is this treated? In many cases, this condition improves on its own, without any treatment. However, treatment may include:  Reducing or modifying physical activity during periods of pain.  Exercising and stretching to strengthen your abdomen and improve the flexibility of your spine.  Icing and applying heat to the affected area.  Medicines that help: ? To relieve pain and swelling. ? To relax your muscles.  Injections of medicines that help to relieve pain, irritation, and inflammation around the sciatic nerve (steroids).  Surgery.  Follow these instructions at home: Medicines  Take over-the-counter and prescription medicines only as told by your health care provider.  Do not drive or operate heavy machinery while taking prescription pain medicine. Managing pain  If directed, apply ice to the affected area. ? Put ice in a plastic bag. ? Place a towel between your skin and the bag. ? Leave the ice on for 20 minutes, 2-3 times a day.  After icing, apply   heat to the affected area before you exercise or as often as told by your health care provider. Use the heat source that your health care provider recommends, such as a moist heat pack or a heating pad. ? Place a towel between your skin and the heat source. ? Leave the heat on for 20-30 minutes. ? Remove the heat if your skin turns bright red. This is especially important if you are unable to feel pain, heat, or cold. You may have a  greater risk of getting burned. Activity  Return to your normal activities as told by your health care provider. Ask your health care provider what activities are safe for you. ? Avoid activities that make your symptoms worse.  Take brief periods of rest throughout the day. Resting in a lying or standing position is usually better than sitting to rest. ? When you rest for longer periods, mix in some mild activity or stretching between periods of rest. This will help to prevent stiffness and pain. ? Avoid sitting for long periods of time without moving. Get up and move around at least one time each hour.  Exercise and stretch regularly, as told by your health care provider.  Do not lift anything that is heavier than 10 lb (4.5 kg) while you have symptoms of sciatica. When you do not have symptoms, you should still avoid heavy lifting, especially repetitive heavy lifting.  When you lift objects, always use proper lifting technique, which includes: ? Bending your knees. ? Keeping the load close to your body. ? Avoiding twisting. General instructions  Use good posture. ? Avoid leaning forward while sitting. ? Avoid hunching over while standing.  Maintain a healthy weight. Excess weight puts extra stress on your back and makes it difficult to maintain good posture.  Wear supportive, comfortable shoes. Avoid wearing high heels.  Avoid sleeping on a mattress that is too soft or too hard. A mattress that is firm enough to support your back when you sleep may help to reduce your pain.  Keep all follow-up visits as told by your health care provider. This is important. Contact a health care provider if:  You have pain that wakes you up when you are sleeping.  You have pain that gets worse when you lie down.  Your pain is worse than you have experienced in the past.  Your pain lasts longer than 4 weeks.  You experience unexplained weight loss. Get help right away if:  You lose control  of your bowel or bladder (incontinence).  You have: ? Weakness in your lower back, pelvis, buttocks, or legs that gets worse. ? Redness or swelling of your back. ? A burning sensation when you urinate. This information is not intended to replace advice given to you by your health care provider. Make sure you discuss any questions you have with your health care provider. Document Released: 09/21/2001 Document Revised: 03/02/2016 Document Reviewed: 06/06/2015 Elsevier Interactive Patient Education  2018 [employer]  

## 2018-01-11 DIAGNOSIS — Z947 Corneal transplant status: Secondary | ICD-10-CM | POA: Diagnosis not present

## 2018-01-11 DIAGNOSIS — Z961 Presence of intraocular lens: Secondary | ICD-10-CM | POA: Diagnosis not present

## 2018-01-11 DIAGNOSIS — Z9841 Cataract extraction status, right eye: Secondary | ICD-10-CM | POA: Diagnosis not present

## 2018-02-08 ENCOUNTER — Other Ambulatory Visit: Payer: Self-pay | Admitting: Family Medicine

## 2018-03-15 DIAGNOSIS — L259 Unspecified contact dermatitis, unspecified cause: Secondary | ICD-10-CM | POA: Diagnosis not present

## 2018-03-15 DIAGNOSIS — D229 Melanocytic nevi, unspecified: Secondary | ICD-10-CM | POA: Diagnosis not present

## 2018-03-15 DIAGNOSIS — L570 Actinic keratosis: Secondary | ICD-10-CM | POA: Diagnosis not present

## 2018-04-12 DIAGNOSIS — M79671 Pain in right foot: Secondary | ICD-10-CM | POA: Diagnosis not present

## 2018-04-24 ENCOUNTER — Other Ambulatory Visit: Payer: Self-pay | Admitting: Neurology

## 2018-04-24 ENCOUNTER — Other Ambulatory Visit: Payer: Self-pay | Admitting: Family Medicine

## 2018-05-18 ENCOUNTER — Telehealth: Payer: Self-pay | Admitting: Neurology

## 2018-05-18 NOTE — Telephone Encounter (Signed)
Patient called needing to get sooner appointment and I have her on the wait list but she said her Tremors are worse

## 2018-05-18 NOTE — Telephone Encounter (Signed)
From last office note in January 2019:  Essential tremor             -She will stay on primidone 100 mg in AM and 150 at night.  Talked to her about her propranolol 20 mg twice a day.  She thought that tremor had worsened but despite doing archimedes spirals, pouring water, intention, I saw no tremor at all in the head or hand.  She asked about DBS and I explained it to her but told her I dont recommend it for her.

## 2018-05-18 NOTE — Telephone Encounter (Signed)
Spoke with patient. She states tremor increased in last 2-3 weeks. No medicine changes. No recent illnesses. I let her know she would need to be seen prior to changing medications since her last visit was over 6 months ago. She agrees with this plan. She is on the wait list. She has a follow up at the end of September. Dr. Carles Collet Juluis Rainier.

## 2018-05-18 NOTE — Telephone Encounter (Signed)
Should she be seen sooner? Thanks

## 2018-05-18 NOTE — Telephone Encounter (Signed)
Agree.  Wouldn't want to change med if just been a short term thing as it may decrease on own.

## 2018-05-29 ENCOUNTER — Other Ambulatory Visit: Payer: Self-pay | Admitting: Family Medicine

## 2018-06-13 ENCOUNTER — Encounter: Payer: Self-pay | Admitting: Neurology

## 2018-06-13 ENCOUNTER — Ambulatory Visit: Payer: PPO | Admitting: Neurology

## 2018-06-13 VITALS — BP 92/60 | HR 60 | Ht 66.0 in | Wt 171.0 lb

## 2018-06-13 DIAGNOSIS — G4440 Drug-induced headache, not elsewhere classified, not intractable: Secondary | ICD-10-CM | POA: Diagnosis not present

## 2018-06-13 DIAGNOSIS — G250 Essential tremor: Secondary | ICD-10-CM | POA: Diagnosis not present

## 2018-06-13 MED ORDER — PRIMIDONE 50 MG PO TABS: 150.0000 mg | ORAL_TABLET | Freq: Three times a day (TID) | ORAL | 1 refills | Status: DC

## 2018-06-13 NOTE — Patient Instructions (Signed)
1. Decrease propranolol 10 mg three times per day (instead of 20 mg twice per day) 2.  Increase primidone - 50 mg - 3 in the AM, 3 in the PM
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
## 2018-07-05 ENCOUNTER — Encounter

## 2018-07-05 ENCOUNTER — Ambulatory Visit: Payer: PPO | Admitting: Neurology

## 2018-07-19 ENCOUNTER — Other Ambulatory Visit: Payer: Self-pay

## 2018-07-19 ENCOUNTER — Ambulatory Visit (INDEPENDENT_AMBULATORY_CARE_PROVIDER_SITE_OTHER): Payer: PPO | Admitting: Family Medicine

## 2018-07-19 ENCOUNTER — Encounter: Payer: Self-pay | Admitting: Family Medicine

## 2018-07-19 VITALS — BP 120/72 | HR 66 | Temp 98.0°F | Ht 66.0 in | Wt 168.7 lb

## 2018-07-19 DIAGNOSIS — E038 Other specified hypothyroidism: Secondary | ICD-10-CM | POA: Diagnosis not present

## 2018-07-19 DIAGNOSIS — E785 Hyperlipidemia, unspecified: Secondary | ICD-10-CM | POA: Diagnosis not present

## 2018-07-19 DIAGNOSIS — Z23 Encounter for immunization: Secondary | ICD-10-CM

## 2018-07-19 DIAGNOSIS — E119 Type 2 diabetes mellitus without complications: Secondary | ICD-10-CM | POA: Diagnosis not present

## 2018-07-19 LAB — LIPID PANEL
CHOL/HDL RATIO: 4
CHOLESTEROL: 151 mg/dL (ref 0–200)
HDL: 42.2 mg/dL (ref >39.00)
LDL CALC: 85 mg/dL (ref 0–99)
NonHDL: 108.8
Triglycerides: 117 mg/dL (ref 0.0–149.0)
VLDL: 23.4 mg/dL (ref 0.0–40.0)

## 2018-07-19 LAB — BASIC METABOLIC PANEL
BUN: 17 mg/dL (ref 6–23)
CALCIUM: 9.9 mg/dL (ref 8.4–10.5)
CO2: 26 meq/L (ref 19–32)
Chloride: 107 mEq/L (ref 96–112)
Creatinine, Ser: 0.88 mg/dL (ref 0.40–1.20)
GFR: 66.65 mL/min (ref >60.00)
Glucose, Bld: 100 mg/dL — ABNORMAL HIGH (ref 70–99)
POTASSIUM: 4.2 meq/L (ref 3.5–5.1)
SODIUM: 141 meq/L (ref 135–145)

## 2018-07-19 LAB — HEPATIC FUNCTION PANEL
ALT: 12 U/L (ref 0–35)
AST: 16 U/L (ref 0–37)
Albumin: 4.5 g/dL (ref 3.5–5.2)
Alkaline Phosphatase: 45 U/L (ref 39–117)
BILIRUBIN DIRECT: 0 mg/dL (ref 0.0–0.3)
BILIRUBIN TOTAL: 0.2 mg/dL (ref 0.2–1.2)
TOTAL PROTEIN: 6.9 g/dL (ref 6.0–8.3)

## 2018-07-19 LAB — TSH: TSH: 2.49 u[IU]/mL (ref 0.35–4.50)

## 2018-07-19 LAB — HEMOGLOBIN A1C: Hgb A1c MFr Bld: 5.9 % (ref 4.6–6.5)

## 2018-07-19 LAB — MICROALBUMIN / CREATININE URINE RATIO
CREATININE, U: 131.2 mg/dL
MICROALB/CREAT RATIO: 0.5 mg/g (ref 0.0–30.0)

## 2018-07-19 MED ORDER — GABAPENTIN 600 MG PO TABS: 600.0000 mg | ORAL_TABLET | Freq: Three times a day (TID) | ORAL | 3 refills | Status: DC

## 2018-07-19 NOTE — Progress Notes (Signed)
  Subjective:     Patient ID: Laura Farrell, female   DOB: 1946/02/22, 72 y.o.   MRN: [id number]  HPI   Here for medical follow-up.  She is followed by neurology for essential tremor and has had some rebound type headaches and they recommend increasing her gabapentin 600 mg 3 times daily.  She also has significant postmenopausal hot flashes.    She has hypothyroidism treated with levothyroxine 75 mcg daily.  She has hyperlipidemia treated with Lipitor and also on fenofibrate.  She has type 2 diabetes which has been controlled with metformin but is overdue for lab work.  Denies any side effects from medications.  No peripheral edema with her gabapentin and she has no daytime sedation.  Past Medical History:  Diagnosis Date  . Diabetes mellitus   . Headache(784.0)   . HLP (hyperkeratosis lenticularis perstans)   . Hot flashes   . Hyperlipidemia   . Nephrolithiasis   . Over weight   . Thyroid disease   . Urine incontinence    Past Surgical History:  Procedure Laterality Date  . ABDOMINAL HYSTERECTOMY    . APPENDECTOMY    . BREAST EXCISIONAL BIOPSY Left   . BREAST EXCISIONAL BIOPSY Left   . BREAST EXCISIONAL BIOPSY Left   . CATARACT EXTRACTION Right 2017  . cornea implant    . left shoulder    . lower back surgery    . [place]  2005  . tubal ligation  1980  . VESICOVAGINAL FISTULA CLOSURE W/ TAH  1995    reports that she has never smoked. She has never used smokeless tobacco. She reports that she does not drink alcohol or use drugs. family history includes Breast cancer (age of onset: 35) in her sister; Cancer in her father; Pancreatic cancer in her mother; Tremor in her brother, father, mother, and sister. No Known Allergies   Review of Systems  Constitutional: Negative for appetite change, chills, fatigue and fever.  Eyes: Negative for visual disturbance.  Respiratory: Negative for cough, chest tightness, shortness of breath and wheezing.   Cardiovascular: Negative  for chest pain, palpitations and leg swelling.  Gastrointestinal: Negative for abdominal pain, constipation, diarrhea, nausea and vomiting.  Endocrine: Negative for polydipsia and polyuria.  Genitourinary: Negative for dysuria and frequency.  Musculoskeletal: Negative for back pain.  Neurological: Negative for dizziness, seizures, syncope, weakness, light-headedness and headaches.       Objective:   Physical Exam  Constitutional: She appears well-developed and well-nourished.  Eyes: Pupils are equal, round, and reactive to light.  Neck: Neck supple. No JVD present. No thyromegaly present.  Cardiovascular: Normal rate and regular rhythm. Exam reveals no gallop.  Pulmonary/Chest: Effort normal and breath sounds normal. No respiratory distress. She has no wheezes. She has no rales.  Musculoskeletal: She exhibits no edema.  Neurological: She is alert.       Assessment:     #1 type 2 diabetes with history of good control  #2 dyslipidemia  #3 hypothyroidism  #4 history of essential tremor    Plan:     -Refilled gabapentin 600 mg 3 times daily -Check labs with A1c, lipid, hepatic, basic metabolic panel, urine microalbumin screen, TSH -Flu vaccine given -Regular yearly eye checkups recommended -Routine medical follow-up in 6 months and sooner as needed  [name] MD [facility]

## 2018-07-24 ENCOUNTER — Other Ambulatory Visit: Payer: Self-pay | Admitting: Family Medicine

## 2018-07-24 MED ORDER — LEVOTHYROXINE SODIUM 75 MCG PO TABS: 75.0000 ug | ORAL_TABLET | Freq: Every day | ORAL | 0 refills | Status: DC

## 2018-07-24 MED ORDER — METFORMIN HCL 500 MG PO TABS: 500.0000 mg | ORAL_TABLET | Freq: Two times a day (BID) | ORAL | 1 refills | Status: DC

## 2018-07-25 ENCOUNTER — Other Ambulatory Visit: Payer: Self-pay

## 2018-07-25 MED ORDER — LEVOTHYROXINE SODIUM 75 MCG PO TABS: 75.0000 ug | ORAL_TABLET | Freq: Every day | ORAL | 0 refills | Status: DC

## 2018-07-28 ENCOUNTER — Telehealth: Payer: Self-pay | Admitting: Neurology

## 2018-07-28 NOTE — Telephone Encounter (Signed)
Received note from [employer] that they have reached out several times to patient about glove system and have not received correspondence.

## 2018-08-03 ENCOUNTER — Other Ambulatory Visit: Payer: Self-pay | Admitting: Physician Assistant

## 2018-08-03 DIAGNOSIS — L820 Inflamed seborrheic keratosis: Secondary | ICD-10-CM | POA: Diagnosis not present

## 2018-08-03 DIAGNOSIS — L570 Actinic keratosis: Secondary | ICD-10-CM | POA: Diagnosis not present

## 2018-08-03 DIAGNOSIS — D229 Melanocytic nevi, unspecified: Secondary | ICD-10-CM | POA: Diagnosis not present

## 2018-08-03 DIAGNOSIS — B078 Other viral warts: Secondary | ICD-10-CM | POA: Diagnosis not present

## 2018-08-03 DIAGNOSIS — D485 Neoplasm of uncertain behavior of skin: Secondary | ICD-10-CM | POA: Diagnosis not present

## 2018-08-04 ENCOUNTER — Encounter: Payer: Self-pay | Admitting: Family Medicine

## 2018-08-04 ENCOUNTER — Other Ambulatory Visit: Payer: Self-pay

## 2018-08-04 ENCOUNTER — Ambulatory Visit (INDEPENDENT_AMBULATORY_CARE_PROVIDER_SITE_OTHER): Payer: PPO | Admitting: Family Medicine

## 2018-08-04 VITALS — BP 112/70 | HR 68 | Temp 98.1°F | Ht 66.0 in | Wt 171.8 lb

## 2018-08-04 DIAGNOSIS — N390 Urinary tract infection, site not specified: Secondary | ICD-10-CM

## 2018-08-04 LAB — POCT URINALYSIS DIPSTICK OB
BILIRUBIN UA: NEGATIVE
Blood, UA: NEGATIVE
GLUCOSE, UA: NEGATIVE
KETONES UA: NEGATIVE
NITRITE UA: NEGATIVE
Spec Grav, UA: >=1.03 — AB (ref 1.010–1.025)
Urobilinogen, UA: 0.2 E.U./dL
pH, UA: 6 (ref 5.0–8.0)

## 2018-08-04 MED ORDER — CEPHALEXIN 500 MG PO CAPS: 500.0000 mg | ORAL_CAPSULE | Freq: Two times a day (BID) | ORAL | 0 refills | Status: DC

## 2018-08-04 NOTE — Patient Instructions (Signed)
Please follow up if symptoms do not improve or as needed.    Urinary Tract Infection, Adult A urinary tract infection (UTI) is an infection of any part of the urinary tract, which includes the kidneys, ureters, bladder, and urethra. These organs make, store, and get rid of urine in the body. UTI can be a bladder infection (cystitis) or kidney infection (pyelonephritis). What are the causes? This infection may be caused by fungi, viruses, or bacteria. Bacteria are the most common cause of UTIs. This condition can also be caused by repeated incomplete emptying of the bladder during urination. What increases the risk? This condition is more likely to develop if:  You ignore your need to urinate or hold urine for long periods of time.  You do not empty your bladder completely during urination.  You wipe back to front after urinating or having a bowel movement, if you are female.  You are uncircumcised, if you are female.  You are constipated.  You have a urinary catheter that stays in place (indwelling).  You have a weak defense (immune) system.  You have a medical condition that affects your bowels, kidneys, or bladder.  You have diabetes.  You take antibiotic medicines frequently or for long periods of time, and the antibiotics no longer work well against certain types of infections (antibiotic resistance).  You take medicines that irritate your urinary tract.  You are exposed to chemicals that irritate your urinary tract.  You are female.  What are the signs or symptoms? Symptoms of this condition include:  Fever.  Frequent urination or passing small amounts of urine frequently.  Needing to urinate urgently.  Pain or burning with urination.  Urine that smells bad or unusual.  Cloudy urine.  Pain in the lower abdomen or back.  Trouble urinating.  Blood in the urine.  Vomiting or being less hungry than normal.  Diarrhea or abdominal pain.  Vaginal discharge, if  you are female.  How is this diagnosed? This condition is diagnosed with a medical history and physical exam. You will also need to provide a urine sample to test your urine. Other tests may be done, including:  Blood tests.  Sexually transmitted disease (STD) testing.  If you have had more than one UTI, a cystoscopy or imaging studies may be done to determine the cause of the infections. How is this treated? Treatment for this condition often includes a combination of two or more of the following:  Antibiotic medicine.  Other medicines to treat less common causes of UTI.  Over-the-counter medicines to treat pain.  Drinking enough water to stay hydrated.  Follow these instructions at home:  Take over-the-counter and prescription medicines only as told by your health care provider.  If you were prescribed an antibiotic, take it as told by your health care provider. Do not stop taking the antibiotic even if you start to feel better.  Avoid alcohol, caffeine, tea, and carbonated beverages. They can irritate your bladder.  Drink enough fluid to keep your urine clear or pale yellow.  Keep all follow-up visits as told by your health care provider. This is important.  Make sure to: ? Empty your bladder often and completely. Do not hold urine for long periods of time. ? Empty your bladder before and after sex. ? Wipe from front to back after a bowel movement if you are female. Use each tissue one time when you wipe. Contact a health care provider if:  You have back pain.  You   have a fever.  You feel nauseous or vomit.  Your symptoms do not get better after 3 days.  Your symptoms go away and then return. Get help right away if:  You have severe back pain or lower abdominal pain.  You are vomiting and cannot keep down any medicines or water. This information is not intended to replace advice given to you by your health care provider. Make sure you discuss any questions you  have with your health care provider. Document Released: 07/07/2005 Document Revised: 03/10/2016 Document Reviewed: 08/18/2015 Elsevier Interactive Patient Education  2018 [employer]vier Inc.  

## 2018-08-04 NOTE — Progress Notes (Signed)
Subjective   CC:  Chief Complaint  Patient presents with  . Dysuria    burning with urination x 3 days     HPI: Laura Farrell is a 72 y.o. female who presents to the office today to address the problems listed above in the chief complaint.  Patient reports dysuria and urinary frequency.  She has sensation of increased urinary pressure.  She denies fevers flank pain nausea vomiting or gross hematuria.  Symptoms have been present for several hours to days.  She denies history of interstitial cystitis.  She denies vaginal symptoms including vaginal discharge or pelvic pain.  Reports diabetes is well controlled.  Assessment  1. Urinary tract infection without hematuria, site unspecified      Plan   UTI: keflex and urine culture. Supportive care. See AVS  Pt requesting to change her PCP to me. I'm ok with that. Told to ask for change at PCP office.   Follow up: prn  Orders Placed This Encounter  Procedures  . Urine Culture  . POC Urinalysis Dipstick OB   Meds ordered this encounter  Medications  . cephALEXin (KEFLEX) 500 MG capsule    Sig: Take 1 capsule (500 mg total) by mouth 2 (two) times daily.    Dispense:  10 capsule    Refill:  0      I reviewed the patients updated PMH, FH, and SocHx.    Patient Active Problem List   Diagnosis Date Noted  . At high risk for breast cancer 09/13/2016  . History of total hysterectomy 09/13/2016  . Hot flashes 03/12/2015  . Diet-controlled type 2 diabetes mellitus ([place]) 02/12/2015  . Dense breast tissue 09/12/2014  . Bronchitis, acute 09/12/2014  . Bilateral hydronephrosis 09/10/2013  . Type 2 diabetes mellitus ([place]) 06/28/2012  . GERD (gastroesophageal reflux disease) 06/28/2012  . Hyperlipidemia 06/28/2012  . Hypothyroid 06/28/2012  . History of cluster headache 06/28/2012  . OSA (obstructive sleep apnea) 05/24/2012  . HLP (hyperkeratosis lenticularis perstans)   . Essential tremor 03/20/2012   Current Meds    Medication Sig  . aspirin 81 MG tablet Take 81 mg by mouth daily.  Marland Kitchen atorvastatin (LIPITOR) 80 MG tablet TAKE ONE TABLET BY MOUTH EVERY DAY  . fenofibrate 54 MG tablet TAKE ONE TABLET BY MOUTH EVERY DAY WITH MEALS  . gabapentin (NEURONTIN) 600 MG tablet Take 1 tablet (600 mg total) by mouth 3 (three) times daily.  Marland Kitchen levothyroxine (SYNTHROID, LEVOTHROID) 75 MCG tablet Take 1 tablet (75 mcg total) by mouth daily.  . metFORMIN (GLUCOPHAGE) 500 MG tablet Take 1 tablet (500 mg total) by mouth 2 (two) times daily.  Marland Kitchen omeprazole (PRILOSEC) 40 MG capsule TAKE 1 CAPSULE BY MOUTH EVERY DAY  . prednisoLONE acetate (PRED FORTE) 1 % ophthalmic suspension PLACE 1 DROP INTO LEFT EYE DAILY  . primidone (MYSOLINE) 50 MG tablet Take 3 tablets (150 mg total) by mouth 3 (three) times daily.  . propranolol (INDERAL) 20 MG tablet TAKE ONE TABLET BY MOUTH TWICE DAILY  . venlafaxine XR (EFFEXOR-XR) 37.5 MG 24 hr capsule TAKE 1 CAPSULE BY MOUTH EVERY DAY WITH FOOD    Review of Systems: Cardiovascular: negative for chest pain Respiratory: negative for SOB or persistent cough Gastrointestinal: negative for abdominal pain Constitutional: Negative for fever malaise or anorexia  Objective  Vitals: BP 112/70   Pulse 68   Temp 98.1 F (36.7 C)   Ht 5\' 6"  (1.676 m)   Wt 171 lb 12.8 oz (77.9 kg)  SpO2 96%   BMI 27.73 kg/m  General: no acute distress  Psych:  Alert and oriented, normal mood and affect Cardiovascular:  RRR without murmur or gallop. no peripheral edema Respiratory:  Good breath sounds bilaterally, CTAB with normal respiratory effort Gastrointestinal: soft, flat abdomen, normal active bowel sounds, no palpable masses, no hepatosplenomegaly, no appreciated hernias, NO CVAT, mild suprapubic ttp w/o rebound or guarding Skin:  Warm, no rashes Neurologic:   Mental status is normal. normal gait Office Visit on 08/04/2018  Component Date Value Ref Range Status  . Color, UA 08/04/2018 amber   Final  .  Clarity, UA 08/04/2018 cloudy   Final  . Glucose, UA 08/04/2018 Negative  Negative Final  . Bilirubin, UA 08/04/2018 neg   Final  . Ketones, UA 08/04/2018 neg   Final  . Spec Grav, UA 08/04/2018 >=1.030* 1.010 - 1.025 Final  . Blood, UA 08/04/2018 neg   Final  . pH, UA 08/04/2018 6.0  5.0 - 8.0 Final  . POC Protein UA 08/04/2018 Trace  Negative, Trace Final  . Urobilinogen, UA 08/04/2018 0.2  0.2 or 1.0 E.U./dL Final  . Nitrite, UA 08/04/2018 neg   Final  . Leukocytes, UA 08/04/2018 Trace* Negative Final    Commons side effects, risks, benefits, and alternatives for medications and treatment plan prescribed today were discussed, and the patient expressed understanding of the given instructions. Patient is instructed to call or message via MyChart if he/she has any questions or concerns regarding our treatment plan. No barriers to understanding were identified. We discussed Red Flag symptoms and signs in detail. Patient expressed understanding regarding what to do in case of urgent or emergency type symptoms.   Medication list was reconciled, printed and provided to the patient in AVS. Patient instructions and summary information was reviewed with the patient as documented in the AVS. This note was prepared with assistance of Dragon voice recognition software. Occasional wrong-word or sound-a-like substitutions may have occurred due to the inherent limitations of voice recognition software

## 2018-08-06 LAB — URINE CULTURE
MICRO NUMBER: 91286668
SPECIMEN QUALITY:: ADEQUATE

## 2018-08-07 NOTE — Progress Notes (Signed)
+   UTI sensitive to abx used. No further action needed

## 2018-08-14 ENCOUNTER — Encounter: Payer: Self-pay | Admitting: Family Medicine

## 2018-08-14 ENCOUNTER — Ambulatory Visit (INDEPENDENT_AMBULATORY_CARE_PROVIDER_SITE_OTHER): Payer: PPO | Admitting: Family Medicine

## 2018-08-14 ENCOUNTER — Other Ambulatory Visit: Payer: Self-pay

## 2018-08-14 VITALS — BP 122/78 | HR 68 | Temp 98.2°F | Ht 66.0 in | Wt 170.6 lb

## 2018-08-14 DIAGNOSIS — E2839 Other primary ovarian failure: Secondary | ICD-10-CM | POA: Diagnosis not present

## 2018-08-14 DIAGNOSIS — Z803 Family history of malignant neoplasm of breast: Secondary | ICD-10-CM | POA: Insufficient documentation

## 2018-08-14 DIAGNOSIS — E119 Type 2 diabetes mellitus without complications: Secondary | ICD-10-CM

## 2018-08-14 DIAGNOSIS — G8929 Other chronic pain: Secondary | ICD-10-CM | POA: Insufficient documentation

## 2018-08-14 DIAGNOSIS — G250 Essential tremor: Secondary | ICD-10-CM

## 2018-08-14 DIAGNOSIS — N951 Menopausal and female climacteric states: Secondary | ICD-10-CM

## 2018-08-14 DIAGNOSIS — E039 Hypothyroidism, unspecified: Secondary | ICD-10-CM | POA: Diagnosis not present

## 2018-08-14 DIAGNOSIS — G44229 Chronic tension-type headache, not intractable: Secondary | ICD-10-CM

## 2018-08-14 HISTORY — DX: Menopausal and female climacteric states: N95.1

## 2018-08-14 HISTORY — DX: Chronic tension-type headache, not intractable: G44.229

## 2018-08-14 MED ORDER — LEVOTHYROXINE SODIUM 75 MCG PO TABS: 75.0000 ug | ORAL_TABLET | Freq: Every day | ORAL | 3 refills | Status: DC

## 2018-08-14 MED ORDER — VENLAFAXINE HCL 25 MG PO TABS: 12.5000 mg | ORAL_TABLET | Freq: Two times a day (BID) | ORAL | 3 refills | Status: DC

## 2018-08-14 MED ORDER — OMEPRAZOLE 20 MG PO CPDR: 20.0000 mg | DELAYED_RELEASE_CAPSULE | Freq: Every day | ORAL | 3 refills | Status: DC

## 2018-08-14 NOTE — Patient Instructions (Signed)
Please return in 6 weeks for cpe And follow up on medication changes.  May schedule an AWV as well anytime.   Stop metformin. Decrease the prilosec and effexor doses.(sent to your pharmacy)  We will call you with information regarding your referral appointment. Bone Density.   If you do not hear from [place] within the next 2 weeks, please let me know. It can take 1-2 weeks to get appointments set up with the specialists.   Medicare recommends an Annual Wellness Visit for all patients. Please schedule this to be done with our Nurse Educator, Maudie Mercury. This is an informative "talk" visit; it's goals are to ensure that your health care needs are being met and to give you education regarding avoiding falls, ensuring you are not suffering from depression or problems with memory or thinking, and to educate you on Advance Care Planning. It helps me take good care of you!  It was a pleasure meeting you today! Thank you for choosing [place] to meet your healthcare needs! I truly look forward to working with you. If you have any questions or concerns, please send me a message via Mychart or call the office at [phone].

## 2018-08-14 NOTE — Progress Notes (Signed)
Subjective  CC:  Chief Complaint  Patient presents with  . Establish Care    Transfer from Dr. Elease Hashimoto, last seen for Diabetes follow-up on 07/19/2018, never had a physical there  . Headache    sees Dr. Carles Collet gave her extra gabapentin and no help     HPI: Laura Farrell is a 72 y.o. female who presents to [place] at [facility] today to establish care with me as a new patient. Had been a patient of Dr. Sheryn Bison for many years until he retired. Then saw Dr. Elease Hashimoto.   She has the following concerns or needs:  We reviewed her PMH in detail. See PL - updated.   Has well controlled diabetes on low dose metformin. No complications.   Has chronic headaches; weekly. Never has seen headache specialist.   Neuro manages complicated essential tremor.   Menopausal hot flashes: placed on effexor and neurontin about 10 years ago while in hot flush study and has never stopped meds. Needs cheaper effexor. Not sure if helps or not. Still has sxs.   HR for Breast cancer: has had multiple biopsies. Dense breast. Took tamoxifen as a preventive for 5 years. Sister with breast cancer, cured.   gerd on high dose PPI  Hypothyroidism: well controlled. Recent testing at goal.  Reports mild osa - doesn't qualify for cpap.  Lab Results  Component Value Date   HGBA1C 5.9 07/19/2018   Lab Results  Component Value Date   TSH 2.49 07/19/2018   Lab Results  Component Value Date   CHOL 151 07/19/2018   CHOL 178 01/16/2014   CHOL 220 (H) 07/17/2013   Lab Results  Component Value Date   HDL 42.20 07/19/2018   HDL 44.80 01/16/2014   HDL 47.10 07/17/2013   Lab Results  Component Value Date   LDLCALC 85 07/19/2018   LDLCALC 93 01/16/2014   LDLCALC 98 03/19/2013   Lab Results  Component Value Date   TRIG 117.0 07/19/2018   TRIG 199.0 (H) 01/16/2014   TRIG 303.0 (H) 07/17/2013   Lab Results  Component Value Date   CHOLHDL 4 07/19/2018   CHOLHDL 4 01/16/2014   CHOLHDL 5 07/17/2013   Lab Results  Component Value Date   LDLDIRECT 132.4 07/17/2013   LDLDIRECT 93.8 06/28/2012    Assessment  1. Acquired hypothyroidism   2. Essential tremor   3. Chronic tension-type headache, not intractable   4. Menopausal hot flushes   5. Family history of breast cancer in sister   36. Diet-controlled type 2 diabetes mellitus ([place])   7. Hypoestrogenism      Plan   Doing well. We discussed adjusting down medications; will try off metformin to see if can control DM with diet.   Decrease effexor and change ot bid dosing. Will see if can slowly wean or if it is helping hot flushes. Same with neurontin.  Continue with Dr. Carles Collet for tremor mgt.   Consider sending to headache specialist. Sounds like tension headaches. Unknown triggers.   Gerd: decrease PPI dose to 20mg  daily.   Set up bone density testing.   Follow up:  Return in about 6 weeks (around 09/25/2018) for complete physical, AWV at patient's convenience. Orders Placed This Encounter  Procedures  . DG Bone Density   Meds ordered this encounter  Medications  . venlafaxine (EFFEXOR) 25 MG tablet    Sig: Take 0.5 tablets (12.5 mg total) by mouth 2 (two) times daily.    Dispense:  90 tablet    Refill:  3  . levothyroxine (SYNTHROID, LEVOTHROID) 75 MCG tablet    Sig: Take 1 tablet (75 mcg total) by mouth daily.    Dispense:  90 tablet    Refill:  3  . omeprazole (PRILOSEC) 20 MG capsule    Sig: Take 1 capsule (20 mg total) by mouth daily.    Dispense:  90 capsule    Refill:  3     Depression screen [facility] 2/9 07/19/2018 12/24/2016 02/12/2015 08/16/2013  Decreased Interest 0 - 0 0  Down, Depressed, Hopeless 0 0 0 0  PHQ - 2 Score 0 0 0 0    We updated and reviewed the patient's past history in detail and it is documented below.  Patient Active Problem List   Diagnosis Date Noted  . Chronic tension headaches 08/14/2018  . Menopausal hot flushes 08/14/2018    On effexor and gabapentin   .  Family history of breast cancer in sister 08/14/2018  . At high risk for breast cancer 09/13/2016  . Diet-controlled type 2 diabetes mellitus ([place]) 02/12/2015  . Bilateral hydronephrosis 09/10/2013    Mild per MRI LS spine 02-2013   . GERD (gastroesophageal reflux disease) 06/28/2012  . Hyperlipidemia 06/28/2012  . Hypothyroidism 06/28/2012  . History of cluster headache 06/28/2012  . OSA (obstructive sleep apnea) 05/24/2012    HST 2013:  AHI 12/hr   . HLP (hyperkeratosis lenticularis perstans)   . Essential tremor 03/20/2012    Sees Dr. Carles Collet, neurology    Health Maintenance  Topic Date Due  . Hepatitis C Screening  Feb 13, 1946  . DEXA SCAN  10/14/2010  . COLONOSCOPY  06/28/2017  . MAMMOGRAM  12/15/2018  . HEMOGLOBIN A1C  01/18/2019  . OPHTHALMOLOGY EXAM  04/18/2019  . URINE MICROALBUMIN  07/20/2019  . FOOT EXAM  08/15/2019  . TETANUS/TDAP  06/28/2022  . INFLUENZA VACCINE  Completed  . PNA vac Low Risk Adult  Completed   Immunization History  Administered Date(s) Administered  . Influenza Split 09/11/2011, 06/28/2012, 08/01/2014  . Influenza, High Dose Seasonal PF 08/02/2013, 08/28/2015, 05/31/2017, 07/19/2018  . Influenza,inj,quad, With Preservative 08/05/2017  . Influenza-Unspecified 08/11/2016, 08/05/2017  . Pneumococcal Conjugate-13 10/29/2003, 12/24/2016  . Pneumococcal Polysaccharide-23 08/21/2012  . Tdap 06/28/2012, 06/28/2012  . Zoster 10/28/2008  . Zoster Recombinat (Shingrix) 01/24/2017, 04/26/2017   Current Meds  Medication Sig  . aspirin 81 MG tablet Take 81 mg by mouth daily.  Marland Kitchen atorvastatin (LIPITOR) 80 MG tablet TAKE ONE TABLET BY MOUTH EVERY DAY  . fenofibrate 54 MG tablet TAKE ONE TABLET BY MOUTH EVERY DAY WITH MEALS  . gabapentin (NEURONTIN) 600 MG tablet Take 1 tablet (600 mg total) by mouth 3 (three) times daily.  Marland Kitchen levothyroxine (SYNTHROID, LEVOTHROID) 75 MCG tablet Take 1 tablet (75 mcg total) by mouth daily.  Marland Kitchen omeprazole (PRILOSEC) 20 MG capsule  Take 1 capsule (20 mg total) by mouth daily.  . prednisoLONE acetate (PRED FORTE) 1 % ophthalmic suspension PLACE 1 DROP INTO LEFT EYE DAILY  . primidone (MYSOLINE) 50 MG tablet Take 3 tablets (150 mg total) by mouth 3 (three) times daily.  . propranolol (INDERAL) 20 MG tablet TAKE ONE TABLET BY MOUTH TWICE DAILY  . [DISCONTINUED] levothyroxine (SYNTHROID, LEVOTHROID) 75 MCG tablet Take 1 tablet (75 mcg total) by mouth daily.  . [DISCONTINUED] metFORMIN (GLUCOPHAGE) 500 MG tablet Take 1 tablet (500 mg total) by mouth 2 (two) times daily.  . [DISCONTINUED] omeprazole (PRILOSEC) 40 MG capsule TAKE 1 CAPSULE  BY MOUTH EVERY DAY  . [DISCONTINUED] venlafaxine XR (EFFEXOR-XR) 37.5 MG 24 hr capsule TAKE 1 CAPSULE BY MOUTH EVERY DAY WITH FOOD    Allergies: Patient has No Known Allergies. Past Medical History Patient  has a past medical history of Chronic tension headaches (08/14/2018), Diabetes mellitus, Headache(784.0), HLP (hyperkeratosis lenticularis perstans), Hot flashes, Hyperlipidemia, Menopausal hot flushes (08/14/2018), Nephrolithiasis, Over weight, Thyroid disease, and Urine incontinence. Past Surgical History Patient  has a past surgical history that includes Abdominal hysterectomy; left shoulder; lower back surgery; cornea implant; tubal ligation (1980); Vesicovaginal fistula closure w/ TAH (1995); Spine surgery (2005); Appendectomy; Cataract extraction (Right, 2017); Breast excisional biopsy (Left); Breast excisional biopsy (Left); and Breast excisional biopsy (Left). Family History: Patient family history includes Breast cancer (age of onset: 34) in her sister; Cancer in her father; Pancreatic cancer in her mother; Tremor in her brother, father, mother, and sister. Social History:  Patient  reports that she has never smoked. She has never used smokeless tobacco. She reports that she does not drink alcohol or use drugs.  Review of Systems: Constitutional: negative for fever or  malaise Ophthalmic: negative for photophobia, double vision or loss of vision Cardiovascular: negative for chest pain, dyspnea on exertion, or new LE swelling Respiratory: negative for SOB or persistent cough Gastrointestinal: negative for abdominal pain, change in bowel habits or melena Genitourinary: negative for dysuria or gross hematuria Musculoskeletal: negative for new gait disturbance or muscular weakness Integumentary: negative for new or persistent rashes Neurological: negative for TIA or stroke symptoms Psychiatric: negative for SI or delusions Allergic/Immunologic: negative for hives  Patient Care Team    Relationship Specialty Notifications Start End  Leamon Arnt, MD PCP - General Family Medicine  08/14/18   Tat, Eustace Quail, DO Consulting Physician Neurology  08/14/18   Ronald Lobo, MD Consulting Physician Gastroenterology  08/14/18     Objective  Vitals: BP 122/78   Pulse 68   Temp 98.2 F (36.8 C)   Ht 5\' 6"  (1.676 m)   Wt 170 lb 9.6 oz (77.4 kg)   BMI 27.54 kg/m  General:  Well developed, well nourished, no acute distress  Psych:  Alert and oriented,normal mood and affect HEENT:  Normocephalic, atraumatic, non-icteric sclera, PERRL, oropharynx is without mass or exudate, supple neck without adenopathy, mass or thyromegaly Cardiovascular:  RRR without gallop, rub or murmur, nondisplaced PMI Respiratory:  Good breath sounds bilaterally, CTAB with normal respiratory effort Gastrointestinal: normal bowel sounds, soft, non-tender, no noted masses. No HSM MSK: OA changes in hands, contusions. Joints are without erythema or swelling Skin:  Warm, no rashes or suspicious lesions noted Neurologic:    Mental status is normal. Gross motor and sensory exams are normal. Normal gait   Commons side effects, risks, benefits, and alternatives for medications and treatment plan prescribed today were discussed, and the patient expressed understanding of the given instructions.  Patient is instructed to call or message via MyChart if he/she has any questions or concerns regarding our treatment plan. No barriers to understanding were identified. We discussed Red Flag symptoms and signs in detail. Patient expressed understanding regarding what to do in case of urgent or emergency type symptoms.   Medication list was reconciled, printed and provided to the patient in AVS. Patient instructions and summary information was reviewed with the patient as documented in the AVS. This note was prepared with assistance of Dragon voice recognition software. Occasional wrong-word or sound-a-like substitutions may have occurred due to the inherent limitations of voice  recognition software

## 2018-08-23 ENCOUNTER — Ambulatory Visit: Payer: PPO

## 2018-08-24 ENCOUNTER — Ambulatory Visit: Payer: PPO

## 2018-08-28 ENCOUNTER — Other Ambulatory Visit: Payer: PPO

## 2018-09-12 ENCOUNTER — Ambulatory Visit
Admission: RE | Admit: 2018-09-12 | Discharge: 2018-09-12 | Disposition: A | Discharge status: Home / Self Care | Payer: PPO | Source: Ambulatory Visit | Attending: Family Medicine | Admitting: Family Medicine

## 2018-09-12 DIAGNOSIS — Z780 Asymptomatic menopausal state: Secondary | ICD-10-CM | POA: Diagnosis not present

## 2018-09-12 DIAGNOSIS — E2839 Other primary ovarian failure: Secondary | ICD-10-CM

## 2018-09-12 DIAGNOSIS — Z13820 Encounter for screening for osteoporosis: Secondary | ICD-10-CM | POA: Diagnosis not present

## 2018-09-13 DIAGNOSIS — Z9841 Cataract extraction status, right eye: Secondary | ICD-10-CM | POA: Diagnosis not present

## 2018-09-13 DIAGNOSIS — H43819 Vitreous degeneration, unspecified eye: Secondary | ICD-10-CM | POA: Diagnosis not present

## 2018-09-13 DIAGNOSIS — Z947 Corneal transplant status: Secondary | ICD-10-CM | POA: Diagnosis not present

## 2018-09-13 DIAGNOSIS — Z961 Presence of intraocular lens: Secondary | ICD-10-CM | POA: Diagnosis not present

## 2018-09-14 NOTE — Progress Notes (Signed)
Subjective:   Laura Farrell is a 72 y.o. female who presents for Medicare Annual (Subsequent) preventive examination.  Review of Systems:  No ROS.  Medicare Wellness Visit. Additional risk factors are reflected in the social history.    Sleep patterns: sleeps 7-8 hours, up to void 0-2.  Home Safety/Smoke Alarms: Feels safe in home. Smoke alarms in place.  Living environment; residence and Firearm Safety: Lives with husband in 1 story home. 4 steps at door with rail.  Seat Belt Safety/Bike Helmet: Wears seat belt.   Female:   Pap-N/A     Mammo-12/14/2017, BI-RADS CATEGORY  1: Negative.      Dexa scan-09/12/2018, normal.          CCS-Colonoscopy 04/11/2015, pt reports normal. [facility].      Objective:     Vitals: There were no vitals taken for this visit.  There is no height or weight on file to calculate BMI.  Advanced Directives 09/13/2016 09/12/2014  Does Patient Have a Medical Advance Directive? Yes Yes  Type of Paramedic of [place];Living will [place];Living will  Copy of [place] in Chart? - No - copy requested    Tobacco Social History   Tobacco Use  Smoking Status Never Smoker  Smokeless Tobacco Never Used     Counseling given: Not Answered   Past Medical History:  Diagnosis Date  . Chronic tension headaches 08/14/2018  . Diabetes mellitus   . Headache(784.0)   . HLP (hyperkeratosis lenticularis perstans)   . Hot flashes   . Hyperlipidemia   . Menopausal hot flushes 08/14/2018   On effexor and gabapentin  . Nephrolithiasis   . Over weight   . Thyroid disease   . Urine incontinence    Past Surgical History:  Procedure Laterality Date  . ABDOMINAL HYSTERECTOMY    . APPENDECTOMY    . BREAST EXCISIONAL BIOPSY Left   . BREAST EXCISIONAL BIOPSY Left   . BREAST EXCISIONAL BIOPSY Left   . CATARACT EXTRACTION Right 2017  . cornea implant    . left shoulder    . lower back surgery    .  [place]  2005  . tubal ligation  1980  . VESICOVAGINAL FISTULA CLOSURE W/ TAH  1995   Family History  Problem Relation Age of Onset  . Tremor Mother   . Pancreatic cancer Mother   . Tremor Father   . Cancer Father        lung  . Breast cancer Sister 55  . Tremor Sister   . Tremor Brother    Social History   Socioeconomic History  . Marital status: Married    Spouse name: Not on file  . Number of children: Not on file  . Years of education: Not on file  . Highest education level: Not on file  Occupational History  . Occupation: Retired Transport planner: RETIRED  Social Needs  . Financial resource strain: Not on file  . Food insecurity:    Worry: Not on file    Inability: Not on file  . Transportation needs:    Medical: Not on file    Non-medical: Not on file  Tobacco Use  . Smoking status: Never Smoker  . Smokeless tobacco: Never Used  Substance and Sexual Activity  . Alcohol use: No  . Drug use: No  . Sexual activity: Not on file  Lifestyle  . Physical activity:    Days per  week: Not on file    Minutes per session: Not on file  . Stress: Not on file  Relationships  . Social connections:    Talks on phone: Not on file    Gets together: Not on file    Attends religious service: Not on file    Active member of club or organization: Not on file    Attends meetings of clubs or organizations: Not on file    Relationship status: Not on file  Other Topics Concern  . Not on file  Social History Narrative  . Not on file    Outpatient Encounter Medications as of 09/15/2018  Medication Sig  . aspirin 81 MG tablet Take 81 mg by mouth daily.  Marland Kitchen atorvastatin (LIPITOR) 80 MG tablet TAKE ONE TABLET BY MOUTH EVERY DAY  . fenofibrate 54 MG tablet TAKE ONE TABLET BY MOUTH EVERY DAY WITH MEALS  . gabapentin (NEURONTIN) 600 MG tablet Take 1 tablet (600 mg total) by mouth 3 (three) times daily.  Marland Kitchen levothyroxine (SYNTHROID, LEVOTHROID) 75 MCG tablet Take 1 tablet  (75 mcg total) by mouth daily.  Marland Kitchen omeprazole (PRILOSEC) 20 MG capsule Take 1 capsule (20 mg total) by mouth daily.  . prednisoLONE acetate (PRED FORTE) 1 % ophthalmic suspension PLACE 1 DROP INTO LEFT EYE DAILY  . primidone (MYSOLINE) 50 MG tablet Take 3 tablets (150 mg total) by mouth 3 (three) times daily.  . propranolol (INDERAL) 20 MG tablet TAKE ONE TABLET BY MOUTH TWICE DAILY  . venlafaxine (EFFEXOR) 25 MG tablet Take 0.5 tablets (12.5 mg total) by mouth 2 (two) times daily.   No facility-administered encounter medications on file as of 09/15/2018.     Activities of Daily Living In your present state of health, do you have any difficulty performing the following activities: 07/19/2018  Hearing? N  Vision? N  Difficulty concentrating or making decisions? N  Walking or climbing stairs? N  Dressing or bathing? N  Doing errands, shopping? N  Some recent data might be hidden    Patient Care Team: Leamon Arnt, MD as PCP - General (Family Medicine) Tat, Eustace Quail, DO as Consulting Physician (Neurology) Ronald Lobo, MD as Consulting Physician (Gastroenterology)    Assessment:   This is a routine wellness examination for Laura Farrell.  Exercise Activities and Dietary recommendations   Diet (meal preparation, eat out, water intake, caffeinated beverages, dairy products, fruits and vegetables): Drinks water, tea and Diet soda, occasional milk/OJ.   Breakfast: sausage/bacon and egg.  Lunch: skips Dinner: protein and vegetables.   Avoids breads.     Goals   None     Fall Risk Fall Risk  06/13/2018 11/02/2017 12/24/2016 09/15/2016 02/12/2015  Falls in the past year? No Yes No No No  Number falls in past yr: - 1 - - -  Injury with Fall? - No - - -  Follow up - Falls evaluation completed - - -    Depression Screen PHQ 2/9 Scores 07/19/2018 12/24/2016 02/12/2015 08/16/2013  PHQ - 2 Score 0 0 0 0     Cognitive Function        Immunization History  Administered Date(s)  Administered  . Influenza Split 09/11/2011, 06/28/2012, 08/01/2014  . Influenza, High Dose Seasonal PF 08/02/2013, 08/28/2015, 05/31/2017, 07/19/2018  . Influenza,inj,quad, With Preservative 08/05/2017  . Influenza-Unspecified 08/11/2016, 08/05/2017  . Pneumococcal Conjugate-13 10/29/2003, 12/24/2016  . Pneumococcal Polysaccharide-23 08/21/2012  . Tdap 06/28/2012, 06/28/2012  . Zoster 10/28/2008  . Zoster Recombinat (Shingrix) 01/24/2017, 04/26/2017  Screening Tests Health Maintenance  Topic Date Due  . Hepatitis C Screening  [birth year]  . COLONOSCOPY  06/28/2017  . MAMMOGRAM  12/15/2018  . HEMOGLOBIN A1C  01/18/2019  . OPHTHALMOLOGY EXAM  04/18/2019  . URINE MICROALBUMIN  07/20/2019  . FOOT EXAM  08/15/2019  . DEXA SCAN  09/12/2020  . TETANUS/TDAP  06/28/2022  . INFLUENZA VACCINE  Completed  . PNA vac Low Risk Adult  Completed        Plan:     Bring a copy of your living will and/or healthcare power of attorney to your next office visit.  Continue doing brain stimulating activities (puzzles, reading, adult coloring books, staying active) to keep memory sharp.   I have personally reviewed and noted the following in the patient's chart:   . Medical and social history . Use of alcohol, tobacco or illicit drugs  . Current medications and supplements . Functional ability and status . Nutritional status . Physical activity . Advanced directives . List of other physicians . Hospitalizations, surgeries, and ER visits in previous 12 months . Vitals . Screenings to include cognitive, depression, and falls . Referrals and appointments  In addition, I have reviewed and discussed with patient certain preventive protocols, quality metrics, and best practice recommendations. A written personalized care plan for preventive services as well as general preventive health recommendations were provided to patient.     Gerilyn Nestle, RN  09/14/2018  PCP Notes: -Prefers to  complete Hep C screening at next OV -F/U PCP 09/25/18 for CPE

## 2018-09-15 ENCOUNTER — Ambulatory Visit (INDEPENDENT_AMBULATORY_CARE_PROVIDER_SITE_OTHER): Payer: PPO

## 2018-09-15 ENCOUNTER — Other Ambulatory Visit: Payer: Self-pay

## 2018-09-15 VITALS — BP 128/70 | HR 65 | Ht 66.0 in | Wt 177.1 lb

## 2018-09-15 DIAGNOSIS — Z0000 Encounter for general adult medical examination without abnormal findings: Secondary | ICD-10-CM | POA: Diagnosis not present

## 2018-09-15 NOTE — Progress Notes (Signed)
I have reviewed the documentation from the recent AWV done by Kim Broome; I agree with the documentation and will follow up on any recommendations or abnormal findings as suggested.  

## 2018-09-15 NOTE — Patient Instructions (Addendum)

## 2018-09-25 ENCOUNTER — Encounter: Payer: Self-pay | Admitting: Family Medicine

## 2018-09-25 ENCOUNTER — Other Ambulatory Visit: Payer: Self-pay

## 2018-09-25 ENCOUNTER — Ambulatory Visit (INDEPENDENT_AMBULATORY_CARE_PROVIDER_SITE_OTHER): Payer: PPO | Admitting: Family Medicine

## 2018-09-25 VITALS — BP 112/78 | HR 59 | Temp 98.0°F | Ht 66.0 in | Wt 173.4 lb

## 2018-09-25 DIAGNOSIS — N951 Menopausal and female climacteric states: Secondary | ICD-10-CM | POA: Diagnosis not present

## 2018-09-25 DIAGNOSIS — E119 Type 2 diabetes mellitus without complications: Secondary | ICD-10-CM | POA: Diagnosis not present

## 2018-09-25 DIAGNOSIS — Z0000 Encounter for general adult medical examination without abnormal findings: Secondary | ICD-10-CM | POA: Diagnosis not present

## 2018-09-25 DIAGNOSIS — R202 Paresthesia of skin: Secondary | ICD-10-CM | POA: Diagnosis not present

## 2018-09-25 DIAGNOSIS — E039 Hypothyroidism, unspecified: Secondary | ICD-10-CM | POA: Diagnosis not present

## 2018-09-25 DIAGNOSIS — E782 Mixed hyperlipidemia: Secondary | ICD-10-CM | POA: Diagnosis not present

## 2018-09-25 DIAGNOSIS — K219 Gastro-esophageal reflux disease without esophagitis: Secondary | ICD-10-CM | POA: Diagnosis not present

## 2018-09-25 LAB — CBC WITH DIFFERENTIAL/PLATELET
BASOS ABS: 0 10*3/uL (ref 0.0–0.1)
Basophils Relative: 0.5 % (ref 0.0–3.0)
EOS ABS: 0.1 10*3/uL (ref 0.0–0.7)
Eosinophils Relative: 1.6 % (ref 0.0–5.0)
HCT: 39.5 % (ref 36.0–46.0)
Hemoglobin: 12.9 g/dL (ref 12.0–15.0)
LYMPHS ABS: 2.5 10*3/uL (ref 0.7–4.0)
Lymphocytes Relative: 47.7 % — ABNORMAL HIGH (ref 12.0–46.0)
MCHC: 32.6 g/dL (ref 30.0–36.0)
MCV: 91.1 fl (ref 78.0–100.0)
MONO ABS: 0.3 10*3/uL (ref 0.1–1.0)
MONOS PCT: 5.6 % (ref 3.0–12.0)
NEUTROS PCT: 44.6 % (ref 43.0–77.0)
Neutro Abs: 2.3 10*3/uL (ref 1.4–7.7)
Platelets: 190 10*3/uL (ref 150.0–400.0)
RBC: 4.33 Mil/uL (ref 3.87–5.11)
RDW: 14 % (ref 11.5–15.5)
WBC: 5.2 10*3/uL (ref 4.0–10.5)

## 2018-09-25 LAB — LIPID PANEL
CHOL/HDL RATIO: 3
Cholesterol: 184 mg/dL (ref 0–200)
HDL: 53.4 mg/dL (ref >39.00)
LDL Cholesterol: 100 mg/dL — ABNORMAL HIGH (ref 0–99)
NONHDL: 130.16
Triglycerides: 153 mg/dL — ABNORMAL HIGH (ref 0.0–149.0)
VLDL: 30.6 mg/dL (ref 0.0–40.0)

## 2018-09-25 LAB — TSH: TSH: 3.86 u[IU]/mL (ref 0.35–4.50)

## 2018-09-25 LAB — COMPREHENSIVE METABOLIC PANEL
ALK PHOS: 49 U/L (ref 39–117)
ALT: 15 U/L (ref 0–35)
AST: 17 U/L (ref 0–37)
Albumin: 4.7 g/dL (ref 3.5–5.2)
BILIRUBIN TOTAL: 0.3 mg/dL (ref 0.2–1.2)
BUN: 22 mg/dL (ref 6–23)
CO2: 27 meq/L (ref 19–32)
Calcium: 10.3 mg/dL (ref 8.4–10.5)
Chloride: 105 mEq/L (ref 96–112)
Creatinine, Ser: 0.85 mg/dL (ref 0.40–1.20)
GFR: 69.34 mL/min (ref >60.00)
GLUCOSE: 119 mg/dL — AB (ref 70–99)
Potassium: 5.2 mEq/L — ABNORMAL HIGH (ref 3.5–5.1)
SODIUM: 140 meq/L (ref 135–145)
Total Protein: 7.5 g/dL (ref 6.0–8.3)

## 2018-09-25 LAB — POCT GLYCOSYLATED HEMOGLOBIN (HGB A1C): Hemoglobin A1C: 5.7 % — AB (ref 4.0–5.6)

## 2018-09-25 NOTE — Progress Notes (Signed)
Subjective  Chief Complaint  Patient presents with  . Annual Exam    doing well, patient is fasting  . Knee Pain    left knee, feels unstable from time to time.     HPI: Laura Farrell is a 72 y.o. female who presents to [place] [facility] today for a Female Wellness Visit. She also has the concerns and/or needs as listed above in the chief complaint. These will be addressed in addition to the Health Maintenance Visit.   Wellness Visit: annual visit with health maintenance review and exam without Pap   AWV a few weeks ago. No concerns.  HM: screens are up to date as are imms. Feeling well. Would like to lose a few pounds. Eye exam up to date: reviewed in care everywhere.  Chronic disease f/u and/or acute problem visit: (deemed necessary to be done in addition to the wellness visit):  DM f/u: off metformin and doing well. No sxs of hyperglycemia. No foot sores or paresthesias. No retinopathy. Urine mac negative. Not on ace; takes statin and fenofibrate. imms up to date.  Hyperlipidemia on meds feels fine. Fasting for recheck  Hot flushes; decreased zoloft dose to half a few months ago: feels the same. Would like to try to get off it. On gabapentin as well for flushes. Has an occasional hot flush now  Low thyroid; euthyroid clinically  C/o itching mid back: for years. No rash.  Lab Results  Component Value Date   HGBA1C 5.7 (A) 09/25/2018   HGBA1C 5.9 07/19/2018   HGBA1C 5.9 05/31/2017     Assessment  1. Annual physical exam   2. Menopausal hot flushes   3. Diet-controlled type 2 diabetes mellitus ([place])   4. Mixed hyperlipidemia   5. Acquired hypothyroidism   6. Notalgia paresthetica   7. Gastroesophageal reflux disease without esophagitis      Plan  Female Wellness Visit:  Age appropriate Health Maintenance and Prevention measures were discussed with patient. Included topics are cancer screening recommendations, ways to keep healthy (see AVS)  including dietary and exercise recommendations, regular eye and dental care, use of seat belts, and avoidance of moderate alcohol use and tobacco use.   BMI: discussed patient's BMI and encouraged positive lifestyle modifications to help get to or maintain a target BMI.  HM needs and immunizations were addressed and ordered. See below for orders. See HM and immunization section for updates.  Routine labs and screening tests ordered including cmp, cbc and lipids where appropriate.  Discussed recommendations regarding Vit D and calcium supplementation (see AVS)  Chronic disease management visit and/or acute problem visit:  Diet controlled diabetes: improved control off metformin! Keep with diet.   Lipids: recheck today on meds.   Hot flushes: wean effexor completely. Monitor.   GERD is well controlled on lower dose ppi. No change.  Check thyroid on meds.   Notalgia paresthetica: sxs control with itch-x. Education given.   Knee OA and possible meniscal injury: supportive care  Follow up: No follow-ups on file.  Orders Placed This Encounter  Procedures  . CBC with Differential/Platelet  . Comprehensive metabolic panel  . Lipid panel  . TSH  . Hepatitis C antibody  . POCT glycosylated hemoglobin (Hb A1C)  . HM DIABETES EYE EXAM   No orders of the defined types were placed in this encounter.     Lifestyle: Body mass index is 27.99 kg/m. Wt Readings from Last 3 Encounters:  09/25/18 173 lb 6.4 oz (  78.7 kg)  09/15/18 177 lb 2 oz (80.3 kg)  08/14/18 170 lb 9.6 oz (77.4 kg)   Diet: general Exercise: intermittently  Patient Active Problem List   Diagnosis Date Noted  . Notalgia paresthetica 09/25/2018  . Chronic tension headaches 08/14/2018  . Menopausal hot flushes 08/14/2018    On effexor and gabapentin   . Family history of breast cancer in sister 08/14/2018  . At high risk for breast cancer 09/13/2016  . Diet-controlled type 2 diabetes mellitus ([place]) 02/12/2015    . Bilateral hydronephrosis 09/10/2013    Mild per MRI LS spine 02-2013   . GERD (gastroesophageal reflux disease) 06/28/2012  . Hyperlipidemia 06/28/2012  . Hypothyroidism 06/28/2012  . History of cluster headache 06/28/2012  . OSA (obstructive sleep apnea) 05/24/2012    HST 2013:  AHI 12/hr   . HLP (hyperkeratosis lenticularis perstans)   . Essential tremor 03/20/2012    Sees Dr. Carles Collet, neurology    Health Maintenance  Topic Date Due  . Hepatitis C Screening  1946-05-24  . MAMMOGRAM  12/15/2018  . HEMOGLOBIN A1C  03/27/2019  . URINE MICROALBUMIN  07/20/2019  . FOOT EXAM  08/15/2019  . OPHTHALMOLOGY EXAM  09/26/2019  . DEXA SCAN  09/12/2020  . TETANUS/TDAP  06/28/2022  . COLONOSCOPY  04/15/2028  . INFLUENZA VACCINE  Completed  . PNA vac Low Risk Adult  Completed   Immunization History  Administered Date(s) Administered  . Influenza Split 09/11/2011, 06/28/2012, 08/01/2014  . Influenza, High Dose Seasonal PF 08/02/2013, 08/28/2015, 05/31/2017, 07/19/2018  . Influenza,inj,quad, With Preservative 08/05/2017  . Influenza-Unspecified 08/11/2016, 08/05/2017  . Pneumococcal Conjugate-13 10/29/2003, 12/24/2016  . Pneumococcal Polysaccharide-23 08/21/2012  . Tdap 06/28/2012, 06/28/2012  . Zoster 10/28/2008  . Zoster Recombinat (Shingrix) 01/24/2017, 04/26/2017   We updated and reviewed the patient's past history in detail and it is documented below. Allergies: Patient has No Known Allergies. Past Medical History Patient  has a past medical history of Chronic tension headaches (08/14/2018), Diabetes mellitus, Headache(784.0), HLP (hyperkeratosis lenticularis perstans), Hot flashes, Hyperlipidemia, Menopausal hot flushes (08/14/2018), Nephrolithiasis, Over weight, Thyroid disease, and Urine incontinence. Past Surgical History Patient  has a past surgical history that includes Abdominal hysterectomy; left shoulder; lower back surgery; cornea implant; tubal ligation (1980);  Vesicovaginal fistula closure w/ TAH (1995); Spine surgery (2005); Appendectomy; Cataract extraction (Right, 2017); Breast excisional biopsy (Left); Breast excisional biopsy (Left); and Breast excisional biopsy (Left). Family History: Patient family history includes Breast cancer (age of onset: 25) in her sister; Cancer in her father; Pancreatic cancer in her mother; Tremor in her brother, father, mother, and sister. Social History:  Patient  reports that she has never smoked. She has never used smokeless tobacco. She reports that she does not drink alcohol or use drugs.  Review of Systems: Constitutional: negative for fever or malaise Ophthalmic: negative for photophobia, double vision or loss of vision Cardiovascular: negative for chest pain, dyspnea on exertion, or new LE swelling Respiratory: negative for SOB or persistent cough Gastrointestinal: negative for abdominal pain, change in bowel habits or melena Genitourinary: negative for dysuria or gross hematuria, no abnormal uterine bleeding or disharge Musculoskeletal: negative for new gait disturbance or muscular weakness + left knee pain, medially with twisting, no injury Integumentary: negative for new or persistent rashes, no breast lumps Neurological: negative for TIA or stroke symptoms Psychiatric: negative for SI or delusions Allergic/Immunologic: negative for hives  Patient Care Team    Relationship Specialty Notifications Start End  Leamon Arnt, MD PCP -  General Family Medicine  08/14/18   Tat, Eustace Quail, DO Consulting Physician Neurology  08/14/18   Ronald Lobo, MD Consulting Physician Gastroenterology  08/14/18   Warren Danes, PA-C Physician Assistant Dermatology  09/15/18   Pa, [facility]  Specialist  09/15/18   Marilynne Halsted, MD Referring Physician Ophthalmology  09/15/18     Objective  Vitals: BP 112/78   Pulse (!) 59   Temp 98 F (36.7 C)   Ht _0  (1.676 m)   Wt 173 lb 6.4 oz (78.7  kg)   SpO2 97%   BMI 27.99 kg/m  General:  Well developed, well nourished, no acute distress  Psych:  Alert and orientedx3,normal mood and affect HEENT:  Normocephalic, atraumatic, non-icteric sclera, PERRL, oropharynx is clear without mass or exudate, supple neck without adenopathy, mass or thyromegaly Cardiovascular:  Normal S1, S2, RRR without gallop, rub or murmur, nondisplaced PMI Respiratory:  Good breath sounds bilaterally, CTAB with normal respiratory effort Gastrointestinal: normal bowel sounds, soft, non-tender, no noted masses. No HSM MSK: no deformities, contusions. Joints are without erythema or swelling. Spine and CVA region are nontender, left knee + mcmurray's and medial ttp w/o redness, swelling or warmth, + crepitus. No effusion. FROM Skin:  Warm, no rashes or suspicious lesions noted Neurologic:    Mental status is normal. CN 2-11 are normal. Gross motor and sensory exams are normal. Normal gait. No tremor Breast Exam: No mass, skin retraction or nipple discharge is appreciated in either breast. No axillary adenopathy. Fibrocystic changes are not noted    Commons side effects, risks, benefits, and alternatives for medications and treatment plan prescribed today were discussed, and the patient expressed understanding of the given instructions. Patient is instructed to call or message via MyChart if he/she has any questions or concerns regarding our treatment plan. No barriers to understanding were identified. We discussed Red Flag symptoms and signs in detail. Patient expressed understanding regarding what to do in case of urgent or emergency type symptoms.   Medication list was reconciled, printed and provided to the patient in AVS. Patient instructions and summary information was reviewed with the patient as documented in the AVS. This note was prepared with assistance of Dragon voice recognition software. Occasional wrong-word or sound-a-like substitutions may have occurred  due to the inherent limitations of voice recognition software

## 2018-09-25 NOTE — Patient Instructions (Signed)
Please return in 3 months for diabetes follow up  You may use ItchX for your back. You can get it at [facility].   Wean off the effexor: Take 1/2 tab daily for one week, Then take 1/2 tab M, W, F, then stop.  Monitor your hot flushes.   If you have any questions or concerns, please don't hesitate to send me a message via MyChart or call the office at [phone]. Thank you for visiting with [place] today! It's our pleasure caring for you.  Recommendations for women to keep healthy:   EXERCISE AND DIET: We recommended that you start or continue a regular exercise program for good health. Regular exercise means any activity that makes your heart beat faster and makes you sweat. We recommend exercising at least 30 minutes per day at least 3 days a week, preferably 4 or 5. We also recommend a diet low in fat and sugar. Inactivity, poor dietary choices and obesity can cause diabetes, heart attack, stroke, and kidney damage, among others.   ALCOHOL AND SMOKING: Women should limit their alcohol intake to no more than 7 drinks/beers/glasses of wine (combined, not each!) per week. Moderation of alcohol intake to this level decreases your risk of breast cancer and liver damage. And of course, no recreational drugs are part of a healthy lifestyle. And absolutely no smoking or even second hand smoke. Most people know smoking can cause heart and lung diseases, but did you know it also contributes to weakening of your bones? Aging of your skin? Yellowing of your teeth and nails?  CALCIUM AND VITAMIN D: Adequate intake of calcium and Vitamin D are recommended. The recommendations for exact amounts of these supplements seem to change often, but generally speaking 600 mg of calcium (either carbonate or citrate) and 800 units of Vitamin D per day seems prudent. Certain women may benefit from higher intake of Vitamin D. If you are among these women, your doctor will have told you during your visit.   PAP SMEARS: Pap  smears, to check for cervical cancer or precancers, have traditionally been done yearly, although recent scientific advances have shown that most women can have pap smears less often. However, every woman still should have a physical exam from her gynecologist every year. It will include a breast check, inspection of the vulva and vagina to check for abnormal growths or skin changes, a visual exam of the cervix, and then an exam to evaluate the size and shape of the uterus and ovaries. And after 74 years of age, a rectal exam is indicated to check for rectal cancers. We will also provide age appropriate advice regarding health maintenance, like when you should have certain vaccines, screening for sexually transmitted diseases, bone density testing, colonoscopy, mammograms, etc.   MAMMOGRAMS: All women over 69 years old should have a yearly mammogram. Many facilities now offer a "3D" mammogram, which may cost around $50 extra out of pocket. If possible, we recommend you accept the option to have the 3D mammogram performed. It both reduces the number of women who will be called back for extra views which then turn out to be normal, and it is better than the routine mammogram at detecting truly abnormal areas.   COLONOSCOPY: Colonoscopy to screen for colon cancer is recommended for all women at age 34. We know, you hate the idea of the prep. We agree, BUT, having colon cancer and not knowing it is worse!! Colon cancer so often starts as a polyp that can  be seen and removed at colonscopy, which can quite literally save your life! And if your first colonoscopy is normal and you have no family history of colon cancer, most women don't have to have it again for 10 years. Once every ten years, you can do something that may end up saving your life, right? We will be happy to help you get it scheduled when you are ready. Be sure to check your insurance coverage so you understand how much it will cost. It may be covered as  a preventative service at no cost, but you should check your particular policy.

## 2018-09-26 LAB — HEPATITIS C ANTIBODY
HEP C AB: NONREACTIVE
SIGNAL TO CUT-OFF: 0.01 (ref <1.00)

## 2018-09-27 MED ORDER — LEVOTHYROXINE SODIUM 88 MCG PO TABS: 88.0000 ug | ORAL_TABLET | Freq: Every day | ORAL | 3 refills | Status: DC

## 2018-09-27 NOTE — Addendum Note (Signed)
Addended by: Billey Chang on: 09/27/2018 09:54 AM   Modules accepted: Orders

## 2018-09-28 ENCOUNTER — Ambulatory Visit (INDEPENDENT_AMBULATORY_CARE_PROVIDER_SITE_OTHER): Payer: PPO | Admitting: Family Medicine

## 2018-09-28 ENCOUNTER — Encounter: Payer: Self-pay | Admitting: Family Medicine

## 2018-09-28 ENCOUNTER — Other Ambulatory Visit: Payer: PPO

## 2018-09-28 VITALS — BP 126/74 | HR 61 | Temp 98.0°F | Resp 16 | Ht 63.5 in | Wt 178.4 lb

## 2018-09-28 DIAGNOSIS — R300 Dysuria: Secondary | ICD-10-CM | POA: Diagnosis not present

## 2018-09-28 DIAGNOSIS — R8290 Unspecified abnormal findings in urine: Secondary | ICD-10-CM

## 2018-09-28 LAB — POCT URINALYSIS DIPSTICK
Bilirubin, UA: NEGATIVE
Blood, UA: NEGATIVE
Glucose, UA: NEGATIVE
Ketones, UA: NEGATIVE
Leukocytes, UA: NEGATIVE
Nitrite, UA: NEGATIVE
Protein, UA: NEGATIVE
Spec Grav, UA: 1.025 (ref 1.010–1.025)
Urobilinogen, UA: 0.2 E.U./dL
pH, UA: 5.5 (ref 5.0–8.0)

## 2018-09-28 NOTE — Progress Notes (Signed)
Subjective   CC:  Chief Complaint  Patient presents with  . Urinary Tract Infection    Urine odor, no pain with urination, frequent urination, no itching burning    HPI: Laura Farrell is a 72 y.o. female who presents to the office today to address the problems listed above in the chief complaint.  Patient reports about a month history of abnormal urine odor. Smells like "amonia". No other sxs: no dysuria, frequency, hesitancy, pain, blood in urine. Tries to drink plenty of water per day. Doesn't eat asparagus or other foods known to cause odors; no otc supplements or vitamins. She denies history of interstitial cystitis.  She denies vaginal symptoms including vaginal discharge or pelvic pain. She denies pelvic odor. She feels fine.   Recent cpe labs reviewed: nl lfts.  Assessment  1. Abnormal urine odor   2. Dysuria      Plan   abnomal urine odor with high spec grav but otherwise normal. See below. rec increasing water and reassured. F/u if sxs develop.   Follow up: No follow-ups on file.  Orders Placed This Encounter  Procedures  . Urine Culture  . POCT Urinalysis Dipstick   No orders of the defined types were placed in this encounter.     I reviewed the patients updated PMH, FH, and SocHx.    Patient Active Problem List   Diagnosis Date Noted  . Notalgia paresthetica 09/25/2018  . Chronic tension headaches 08/14/2018  . Menopausal hot flushes 08/14/2018  . Family history of breast cancer in sister 08/14/2018  . At high risk for breast cancer 09/13/2016  . Diet-controlled type 2 diabetes mellitus ([place]) 02/12/2015  . Bilateral hydronephrosis 09/10/2013  . GERD (gastroesophageal reflux disease) 06/28/2012  . Hyperlipidemia 06/28/2012  . Hypothyroidism 06/28/2012  . History of cluster headache 06/28/2012  . OSA (obstructive sleep apnea) 05/24/2012  . HLP (hyperkeratosis lenticularis perstans)   . Essential tremor 03/20/2012   Current Meds  Medication Sig  .  aspirin 81 MG tablet Take 81 mg by mouth daily.  Marland Kitchen atorvastatin (LIPITOR) 80 MG tablet TAKE ONE TABLET BY MOUTH EVERY DAY  . fenofibrate 54 MG tablet TAKE ONE TABLET BY MOUTH EVERY DAY WITH MEALS  . gabapentin (NEURONTIN) 600 MG tablet Take 1 tablet (600 mg total) by mouth 3 (three) times daily.  Marland Kitchen levothyroxine (SYNTHROID, LEVOTHROID) 88 MCG tablet Take 1 tablet (88 mcg total) by mouth daily.  Marland Kitchen omeprazole (PRILOSEC) 20 MG capsule Take 1 capsule (20 mg total) by mouth daily.  . prednisoLONE acetate (PRED FORTE) 1 % ophthalmic suspension PLACE 1 DROP INTO LEFT EYE DAILY  . primidone (MYSOLINE) 50 MG tablet Take 3 tablets (150 mg total) by mouth 3 (three) times daily.  . propranolol (INDERAL) 20 MG tablet TAKE ONE TABLET BY MOUTH TWICE DAILY    Review of Systems: Cardiovascular: negative for chest pain Respiratory: negative for SOB or persistent cough Gastrointestinal: negative for abdominal pain Constitutional: Negative for fever malaise or anorexia  Objective  Vitals: BP 126/74   Pulse 61   Temp 98 F (36.7 C) (Oral)   Resp 16   Ht 5' 3.5" (1.613 m)   Wt 178 lb 6.4 oz (80.9 kg)   SpO2 97%   BMI 31.11 kg/m  General: no acute distress  Gastrointestinal: no suprapubic ttp  Office Visit on 09/28/2018  Component Date Value Ref Range Status  . Clarity, UA 09/28/2018 cloudy   Final  . Glucose, UA 09/28/2018 Negative  Negative Final  .  Bilirubin, UA 09/28/2018 negative   Final  . Ketones, UA 09/28/2018 negative   Final  . Spec Grav, UA 09/28/2018 1.025  1.010 - 1.025 Final  . Blood, UA 09/28/2018 negative   Final  . pH, UA 09/28/2018 5.5  5.0 - 8.0 Final  . Protein, UA 09/28/2018 Negative  Negative Final  . Urobilinogen, UA 09/28/2018 0.2  0.2 or 1.0 E.U./dL Final  . Nitrite, UA 09/28/2018 neg   Final  . Leukocytes, UA 09/28/2018 Negative  Negative Final    Commons side effects, risks, benefits, and alternatives for medications and treatment plan prescribed today were  discussed, and the patient expressed understanding of the given instructions. Patient is instructed to call or message via MyChart if he/she has any questions or concerns regarding our treatment plan. No barriers to understanding were identified. We discussed Red Flag symptoms and signs in detail. Patient expressed understanding regarding what to do in case of urgent or emergency type symptoms.   Medication list was reconciled, printed and provided to the patient in AVS. Patient instructions and summary information was reviewed with the patient as documented in the AVS. This note was prepared with assistance of Dragon voice recognition software. Occasional wrong-word or sound-a-like substitutions may have occurred due to the inherent limitations of voice recognition software

## 2018-09-28 NOTE — Patient Instructions (Signed)
Please follow up if symptoms do not improve or as needed.   

## 2018-09-30 LAB — URINE CULTURE
MICRO NUMBER:: 91523308
Result:: NO GROWTH
SPECIMEN QUALITY:: ADEQUATE

## 2018-10-02 ENCOUNTER — Other Ambulatory Visit: Payer: Self-pay | Admitting: Family Medicine

## 2018-10-02 ENCOUNTER — Other Ambulatory Visit: Payer: Self-pay | Admitting: Neurology

## 2018-10-10 ENCOUNTER — Encounter: Payer: Self-pay | Admitting: *Deleted

## 2018-10-14 ENCOUNTER — Other Ambulatory Visit: Payer: Self-pay | Admitting: Neurology

## 2018-10-17 DIAGNOSIS — M25562 Pain in left knee: Secondary | ICD-10-CM | POA: Diagnosis not present

## 2018-10-24 DIAGNOSIS — M25562 Pain in left knee: Secondary | ICD-10-CM | POA: Diagnosis not present

## 2018-10-25 ENCOUNTER — Other Ambulatory Visit: Payer: Self-pay | Admitting: Family Medicine

## 2018-10-31 DIAGNOSIS — M1712 Unilateral primary osteoarthritis, left knee: Secondary | ICD-10-CM | POA: Diagnosis not present

## 2018-10-31 DIAGNOSIS — S83242A Other tear of medial meniscus, current injury, left knee, initial encounter: Secondary | ICD-10-CM | POA: Diagnosis not present

## 2018-10-31 DIAGNOSIS — D229 Melanocytic nevi, unspecified: Secondary | ICD-10-CM | POA: Diagnosis not present

## 2018-10-31 DIAGNOSIS — L820 Inflamed seborrheic keratosis: Secondary | ICD-10-CM | POA: Diagnosis not present

## 2018-11-06 ENCOUNTER — Other Ambulatory Visit: Payer: Self-pay | Admitting: Family Medicine

## 2018-11-06 DIAGNOSIS — Z1231 Encounter for screening mammogram for malignant neoplasm of breast: Secondary | ICD-10-CM

## 2018-11-07 DIAGNOSIS — S83232A Complex tear of medial meniscus, current injury, left knee, initial encounter: Secondary | ICD-10-CM | POA: Diagnosis not present

## 2018-11-07 DIAGNOSIS — X58XXXA Exposure to other specified factors, initial encounter: Secondary | ICD-10-CM | POA: Diagnosis not present

## 2018-11-07 DIAGNOSIS — G8918 Other acute postprocedural pain: Secondary | ICD-10-CM | POA: Diagnosis not present

## 2018-11-07 DIAGNOSIS — M94262 Chondromalacia, left knee: Secondary | ICD-10-CM | POA: Diagnosis not present

## 2018-11-07 DIAGNOSIS — M6752 Plica syndrome, left knee: Secondary | ICD-10-CM | POA: Diagnosis not present

## 2018-11-07 DIAGNOSIS — Y999 Unspecified external cause status: Secondary | ICD-10-CM | POA: Diagnosis not present

## 2018-11-07 DIAGNOSIS — M2342 Loose body in knee, left knee: Secondary | ICD-10-CM | POA: Diagnosis not present

## 2018-11-14 DIAGNOSIS — M25562 Pain in left knee: Secondary | ICD-10-CM | POA: Diagnosis not present

## 2018-11-14 DIAGNOSIS — Z5189 Encounter for other specified aftercare: Secondary | ICD-10-CM | POA: Diagnosis not present

## 2018-11-14 DIAGNOSIS — M25362 Other instability, left knee: Secondary | ICD-10-CM | POA: Diagnosis not present

## 2018-11-22 DIAGNOSIS — M25562 Pain in left knee: Secondary | ICD-10-CM | POA: Insufficient documentation

## 2018-11-27 DIAGNOSIS — M25562 Pain in left knee: Secondary | ICD-10-CM | POA: Diagnosis not present

## 2018-12-06 ENCOUNTER — Ambulatory Visit: Payer: PPO | Admitting: Family Medicine
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
## 2018-12-13 ENCOUNTER — Ambulatory Visit: Payer: PPO | Admitting: Neurology

## 2018-12-13 ENCOUNTER — Encounter: Payer: Self-pay | Admitting: Neurology

## 2018-12-13 VITALS — BP 102/78 | HR 60 | Ht 64.0 in | Wt 180.0 lb

## 2018-12-13 DIAGNOSIS — G4440 Drug-induced headache, not elsewhere classified, not intractable: Secondary | ICD-10-CM | POA: Diagnosis not present

## 2018-12-13 DIAGNOSIS — R413 Other amnesia: Secondary | ICD-10-CM | POA: Diagnosis not present

## 2018-12-13 DIAGNOSIS — R251 Tremor, unspecified: Secondary | ICD-10-CM

## 2018-12-13 MED ORDER — PRIMIDONE 50 MG PO TABS: 150.0000 mg | ORAL_TABLET | Freq: Two times a day (BID) | ORAL | 1 refills | Status: DC

## 2018-12-13 NOTE — Patient Instructions (Signed)
Increase primidone to 3 tablets in the AM and 3 tablets in the PM

## 2018-12-18 ENCOUNTER — Ambulatory Visit
Admission: RE | Admit: 2018-12-18 | Discharge: 2018-12-18 | Disposition: A | Discharge status: Home / Self Care | Payer: PPO | Source: Ambulatory Visit | Attending: Family Medicine | Admitting: Family Medicine

## 2018-12-18 DIAGNOSIS — Z1231 Encounter for screening mammogram for malignant neoplasm of breast: Secondary | ICD-10-CM

## 2018-12-21 ENCOUNTER — Encounter: Payer: Self-pay | Admitting: Family Medicine

## 2018-12-21 ENCOUNTER — Other Ambulatory Visit: Payer: Self-pay

## 2018-12-21 ENCOUNTER — Ambulatory Visit (INDEPENDENT_AMBULATORY_CARE_PROVIDER_SITE_OTHER): Payer: PPO | Admitting: Family Medicine

## 2018-12-21 DIAGNOSIS — E039 Hypothyroidism, unspecified: Secondary | ICD-10-CM | POA: Diagnosis not present

## 2018-12-21 DIAGNOSIS — E119 Type 2 diabetes mellitus without complications: Secondary | ICD-10-CM | POA: Diagnosis not present

## 2018-12-21 DIAGNOSIS — R5383 Other fatigue: Secondary | ICD-10-CM | POA: Diagnosis not present

## 2018-12-21 LAB — CBC WITH DIFFERENTIAL/PLATELET
Basophils Absolute: 0 10*3/uL (ref 0.0–0.1)
Basophils Relative: 0.5 % (ref 0.0–3.0)
Eosinophils Absolute: 0.1 10*3/uL (ref 0.0–0.7)
Eosinophils Relative: 2.9 % (ref 0.0–5.0)
HCT: 36.5 % (ref 36.0–46.0)
Hemoglobin: 11.9 g/dL — ABNORMAL LOW (ref 12.0–15.0)
Lymphocytes Relative: 48.6 % — ABNORMAL HIGH (ref 12.0–46.0)
Lymphs Abs: 2.3 10*3/uL (ref 0.7–4.0)
MCHC: 32.5 g/dL (ref 30.0–36.0)
MCV: 88.1 fl (ref 78.0–100.0)
Monocytes Absolute: 0.3 10*3/uL (ref 0.1–1.0)
Monocytes Relative: 6.2 % (ref 3.0–12.0)
Neutro Abs: 2 10*3/uL (ref 1.4–7.7)
Neutrophils Relative %: 41.8 % — ABNORMAL LOW (ref 43.0–77.0)
Platelets: 199 10*3/uL (ref 150.0–400.0)
RBC: 4.14 Mil/uL (ref 3.87–5.11)
RDW: 14 % (ref 11.5–15.5)
WBC: 4.7 10*3/uL (ref 4.0–10.5)

## 2018-12-21 LAB — COMPREHENSIVE METABOLIC PANEL
ALT: 12 U/L (ref 0–35)
AST: 15 U/L (ref 0–37)
Albumin: 4.7 g/dL (ref 3.5–5.2)
Alkaline Phosphatase: 52 U/L (ref 39–117)
BUN: 21 mg/dL (ref 6–23)
CO2: 24 meq/L (ref 19–32)
Calcium: 9.8 mg/dL (ref 8.4–10.5)
Chloride: 106 mEq/L (ref 96–112)
Creatinine, Ser: 0.88 mg/dL (ref 0.40–1.20)
GFR: 62.64 mL/min (ref >60.00)
Glucose, Bld: 115 mg/dL — ABNORMAL HIGH (ref 70–99)
Potassium: 4.2 mEq/L (ref 3.5–5.1)
Sodium: 139 mEq/L (ref 135–145)
Total Bilirubin: 0.3 mg/dL (ref 0.2–1.2)
Total Protein: 7.1 g/dL (ref 6.0–8.3)

## 2018-12-21 LAB — POCT GLYCOSYLATED HEMOGLOBIN (HGB A1C): Hemoglobin A1C: 6.2 % — AB (ref 4.0–5.6)

## 2018-12-21 LAB — TSH: TSH: 2.76 u[IU]/mL (ref 0.35–4.50)

## 2018-12-21 LAB — SEDIMENTATION RATE: Sed Rate: 6 mm/hr (ref 0–30)

## 2018-12-23 ENCOUNTER — Encounter: Payer: Self-pay | Admitting: Family Medicine

## 2018-12-23 NOTE — Progress Notes (Signed)
Subjective  CC:  Chief Complaint  Patient presents with  . Fatigue  . Diabetes  . Hypothyroidism    HPI: Laura Farrell is a 73 y.o. female who presents to the office today for follow up of diabetes and problems listed above in the chief complaint.   Diabetes follow up: Her diabetic control is reported as Unchanged.  Diet is fair. She denies exertional CP or SOB or symptomatic hypoglycemia. She denies foot sores or paresthesias.   Complains of persistent fatigue.  She reports she is tired all the time.  Feels like she is sleeping well but she does snore.  She did have a sleep apnea study done about 3 to 4 years ago.  It was borderline.  She was recommended to lose weight to help.  She does have hypothyroidism and levels have been fairly well-controlled.  She denies lower extremity edema skin changes or hair changes.  Mood is good.  She denies any symptoms of depression.  She denies melena, fevers, weight loss, chills.  She is on gabapentin twice a day.  She started this over a decade ago for HRT and hot flash treatment. Wt Readings from Last 3 Encounters:  12/13/18 180 lb (81.6 kg)  09/28/18 178 lb 6.4 oz (80.9 kg)  09/25/18 173 lb 6.4 oz (78.7 kg)    BP Readings from Last 3 Encounters:  12/13/18 102/78  09/28/18 126/74  09/25/18 112/78    Assessment  1. Diet-controlled type 2 diabetes mellitus ([place])   2. Acquired hypothyroidism   3. Fatigue, unspecified type      Plan   Diabetes is currently adequately controlled.  No changes  Recheck hypothyroidism  Fatigue: Unclear etiology.  Could be related to gabapentin.  Hold a.m. dose to see if that helps fatigue levels.  [place] lab work.  Consider repeat sleep study in the future.  Follow up: Return in about 3 months (around 03/23/2019) for follow up Diabetes.. Orders Placed This Encounter  Procedures  . Comprehensive metabolic panel  . CBC with Differential/Platelet  . TSH  . Sedimentation rate  . POCT HgB A1C   No  orders of the defined types were placed in this encounter.     Immunization History  Administered Date(s) Administered  . Influenza Split 09/11/2011, 06/28/2012, 08/01/2014  . Influenza, High Dose Seasonal PF 08/02/2013, 08/28/2015, 05/31/2017, 07/19/2018  . Influenza,inj,quad, With Preservative 08/05/2017  . Influenza-Unspecified 08/11/2016, 08/05/2017  . Pneumococcal Conjugate-13 10/29/2003, 12/24/2016  . Pneumococcal Polysaccharide-23 08/21/2012  . Tdap 06/28/2012, 06/28/2012  . Zoster 10/28/2008  . Zoster Recombinat (Shingrix) 01/24/2017, 04/26/2017    Diabetes Related Lab Review: Lab Results  Component Value Date   HGBA1C 6.2 (A) 12/21/2018   HGBA1C 5.7 (A) 09/25/2018   HGBA1C 5.9 07/19/2018    Lab Results  Component Value Date   MICROALBUR <0.7 07/19/2018   Lab Results  Component Value Date   CREATININE 0.88 12/21/2018   BUN 21 12/21/2018   NA 139 12/21/2018   K 4.2 12/21/2018   CL 106 12/21/2018   CO2 24 12/21/2018   Lab Results  Component Value Date   CHOL 184 09/25/2018   CHOL 151 07/19/2018   CHOL 178 01/16/2014   Lab Results  Component Value Date   HDL 53.40 09/25/2018   HDL 42.20 07/19/2018   HDL 44.80 01/16/2014   Lab Results  Component Value Date   LDLCALC 100 (H) 09/25/2018   LDLCALC 85 07/19/2018   LDLCALC 93 01/16/2014   Lab Results  Component  Value Date   TRIG 153.0 (H) 09/25/2018   TRIG 117.0 07/19/2018   TRIG 199.0 (H) 01/16/2014   Lab Results  Component Value Date   CHOLHDL 3 09/25/2018   CHOLHDL 4 07/19/2018   CHOLHDL 4 01/16/2014   Lab Results  Component Value Date   LDLDIRECT 132.4 07/17/2013   LDLDIRECT 93.8 06/28/2012   The 10-year ASCVD risk score Mikey Bussing DC Jr., et al., 2013) is: 19.4%   Values used to calculate the score:     Age: 17 years     Sex: Female     Is Non-Hispanic African American: No     Diabetic: Yes     Tobacco smoker: No     Systolic Blood Pressure: 465 mmHg     Is BP treated: No     HDL  Cholesterol: 53.4 mg/dL     Total Cholesterol: 184 mg/dL I have reviewed the PMH, Fam and Soc history. Patient Active Problem List   Diagnosis Date Noted  . Notalgia paresthetica 09/25/2018  . Chronic tension headaches 08/14/2018  . Menopausal hot flushes 08/14/2018    On effexor and gabapentin   . Family history of breast cancer in sister 08/14/2018  . At high risk for breast cancer 09/13/2016  . Diet-controlled type 2 diabetes mellitus ([place]) 02/12/2015  . Bilateral hydronephrosis 09/10/2013    Mild per MRI LS spine 02-2013   . GERD (gastroesophageal reflux disease) 06/28/2012  . Hyperlipidemia 06/28/2012  . Hypothyroidism 06/28/2012  . History of cluster headache 06/28/2012  . OSA (obstructive sleep apnea) 05/24/2012    HST 2013:  AHI 12/hr   . HLP (hyperkeratosis lenticularis perstans)   . Essential tremor 03/20/2012    Sees Dr. Carles Collet, neurology     Social History: Patient  reports that she has never smoked. She has never used smokeless tobacco. She reports that she does not drink alcohol or use drugs.  Review of Systems: Ophthalmic: negative for eye pain, loss of vision or double vision Cardiovascular: negative for chest pain Respiratory: negative for SOB or persistent cough Gastrointestinal: negative for abdominal pain Genitourinary: negative for dysuria or gross hematuria MSK: negative for foot lesions Neurologic: negative for weakness or gait disturbance  Objective  Vitals: There were no vitals taken for this visit. General: well appearing, no acute distress  Psych:  Alert and oriented, normal mood and affect HEENT:  Normocephalic, atraumatic, moist mucous membranes, supple neck  Cardiovascular:  Nl S1 and S2, RRR without murmur, gallop or rub. no edema Respiratory:  Good breath sounds bilaterally, CTAB with normal effort, no rales Gastrointestinal: normal BS, soft, nontender Skin:  Warm, no rashes Neurologic:   Mental status is normal. normal gait Foot exam: no  erythema, pallor, or cyanosis visible nl proprioception and sensation to monofilament testing bilaterally, +2 distal pulses bilaterally    Diabetic education: ongoing education regarding chronic disease management for diabetes was given today. We continue to reinforce the ABC's of diabetic management: A1c (<7 or 8 dependent upon patient), tight blood pressure control, and cholesterol management with goal LDL < 100 minimally. We discuss diet strategies, exercise recommendations, medication options and possible side effects. At each visit, we review recommended immunizations and preventive care recommendations for diabetics and stress that good diabetic control can prevent other problems. See below for this patient's data.    Commons side effects, risks, benefits, and alternatives for medications and treatment plan prescribed today were discussed, and the patient expressed understanding of the given instructions. Patient is instructed to call  or message via MyChart if he/she has any questions or concerns regarding our treatment plan. No barriers to understanding were identified. We discussed Red Flag symptoms and signs in detail. Patient expressed understanding regarding what to do in case of urgent or emergency type symptoms.   Medication list was reconciled, printed and provided to the patient in AVS. Patient instructions and summary information was reviewed with the patient as documented in the AVS. This note was prepared with assistance of Dragon voice recognition software. Occasional wrong-word or sound-a-like substitutions may have occurred due to the inherent limitations of voice recognition software

## 2018-12-26 ENCOUNTER — Ambulatory Visit: Payer: PPO | Admitting: Family Medicine

## 2019-01-02 ENCOUNTER — Ambulatory Visit: Payer: PPO | Admitting: Family Medicine

## 2019-01-03 ENCOUNTER — Telehealth: Payer: Self-pay | Admitting: Family Medicine

## 2019-01-03 NOTE — Telephone Encounter (Signed)
Copied from [place] [id number]. Topic: Quick Communication - Rx Refill/Question >> Jan 03, 2019  3:08 PM Reyne Dumas L wrote: Medication: Muscle relaxer  Pt called and left message on [facility] voicemail box stating that she is having severe back pain and would like more muscle relaxer's. Pt does not want to come into office. Pt can be reached at [phone] or [phone]

## 2019-01-04 ENCOUNTER — Telehealth: Payer: PPO | Admitting: Family

## 2019-01-04 DIAGNOSIS — M5440 Lumbago with sciatica, unspecified side: Secondary | ICD-10-CM | POA: Diagnosis not present

## 2019-01-04 MED ORDER — NAPROXEN 500 MG PO TABS: 500.0000 mg | ORAL_TABLET | Freq: Two times a day (BID) | ORAL | 0 refills | Status: DC

## 2019-01-04 MED ORDER — CYCLOBENZAPRINE HCL 10 MG PO TABS: 10.0000 mg | ORAL_TABLET | Freq: Three times a day (TID) | ORAL | 0 refills | Status: DC | PRN

## 2019-01-04 NOTE — Telephone Encounter (Signed)
Webex or telephone visit? I do not see that she has a muscle relaxer, I do see Gabapentin

## 2019-01-04 NOTE — Telephone Encounter (Signed)
She states Mychart would be better for her at this time, she is going to schedule a Mychart visit

## 2019-01-04 NOTE — Progress Notes (Signed)

## 2019-01-04 NOTE — Telephone Encounter (Signed)
FYI

## 2019-01-04 NOTE — Telephone Encounter (Signed)
Can she do a WEBex video visit? That would be preferred.  If not, then a mychart visit -- get consent for either please. Thanks.  This is a new problem.

## 2019-01-10 ENCOUNTER — Other Ambulatory Visit: Payer: Self-pay | Admitting: Neurology

## 2019-02-20 ENCOUNTER — Other Ambulatory Visit: Payer: Self-pay | Admitting: Family Medicine

## 2019-02-20 MED ORDER — ATORVASTATIN CALCIUM 80 MG PO TABS: 80.0000 mg | ORAL_TABLET | Freq: Every day | ORAL | 3 refills | Status: DC

## 2019-02-20 NOTE — Telephone Encounter (Signed)
Lab Results  Component Value Date   CHOL 184 09/25/2018   HDL 53.40 09/25/2018   LDLCALC 100 (H) 09/25/2018   LDLDIRECT 132.4 07/17/2013   TRIG 153.0 (H) 09/25/2018   CHOLHDL 3 09/25/2018

## 2019-03-17 ENCOUNTER — Other Ambulatory Visit: Payer: Self-pay | Admitting: Family Medicine

## 2019-03-21 DIAGNOSIS — M1712 Unilateral primary osteoarthritis, left knee: Secondary | ICD-10-CM | POA: Diagnosis not present

## 2019-03-21 DIAGNOSIS — Z9889 Other specified postprocedural states: Secondary | ICD-10-CM | POA: Diagnosis not present

## 2019-04-10 DIAGNOSIS — M1712 Unilateral primary osteoarthritis, left knee: Secondary | ICD-10-CM | POA: Insufficient documentation

## 2019-05-19 ENCOUNTER — Other Ambulatory Visit: Payer: Self-pay | Admitting: Family Medicine

## 2019-05-21 ENCOUNTER — Other Ambulatory Visit: Payer: Self-pay

## 2019-05-21 ENCOUNTER — Encounter: Payer: Self-pay | Admitting: Family Medicine

## 2019-05-21 ENCOUNTER — Ambulatory Visit (INDEPENDENT_AMBULATORY_CARE_PROVIDER_SITE_OTHER): Payer: PPO | Admitting: Family Medicine

## 2019-05-21 VITALS — BP 122/74 | HR 61 | Temp 98.0°F | Resp 16 | Ht 63.5 in | Wt 178.2 lb

## 2019-05-21 DIAGNOSIS — E119 Type 2 diabetes mellitus without complications: Secondary | ICD-10-CM

## 2019-05-21 DIAGNOSIS — M19042 Primary osteoarthritis, left hand: Secondary | ICD-10-CM | POA: Diagnosis not present

## 2019-05-21 MED ORDER — DICLOFENAC SODIUM 1 % TD GEL: 2.0000 g | Freq: Four times a day (QID) | TRANSDERMAL | 3 refills | Status: DC

## 2019-05-21 NOTE — Progress Notes (Signed)
Subjective  CC:  Chief Complaint  Patient presents with  . Thumb pain    left side.. Started Friday denies injury and has not taking any medication.. Reports movement causes pain to radiate down wrist    HPI: Laura Farrell is a 73 y.o. female who presents to the office today for follow up of diabetes and problems listed above in the chief complaint.   Diabetes follow up: Her diabetic control is reported as Unchanged. remaind diet controlled.  She denies exertional CP or SOB or symptomatic hypoglycemia. She denies foot sores or paresthesias.  Wt Readings from Last 3 Encounters:  05/21/19 178 lb 3.2 oz (80.8 kg)  12/13/18 180 lb (81.6 kg)  09/28/18 178 lb 6.4 oz (80.9 kg)    BP Readings from Last 3 Encounters:  05/21/19 122/74  12/13/18 102/78  09/28/18 126/74   right thumb hurts x 3-4 days. At base of thumb. No injury. Holds books with her thumb for much of many days as she is an avid reader. New pain. Has OA elsewhere.   Assessment  1. Primary osteoarthritis of left hand   2. Diet-controlled type 2 diabetes mellitus ([place])      Plan   Diabetes is currently well controlled. Continue diet.   OA 1st MCP: education; advil tid prn and voltaren gel.   Follow up: dec for cpe. No orders of the defined types were placed in this encounter.  Meds ordered this encounter  Medications  . diclofenac sodium (VOLTAREN) 1 % GEL    Sig: Apply 2 g topically 4 (four) times daily.    Dispense:  400 g    Refill:  3      Immunization History  Administered Date(s) Administered  . Influenza Split 09/11/2011, 06/28/2012, 08/01/2014  . Influenza, High Dose Seasonal PF 08/02/2013, 08/28/2015, 05/31/2017, 07/19/2018  . Influenza, Quadrivalent, Recombinant, Inj, Pf 08/05/2017  . Influenza,inj,quad, With Preservative 08/05/2017  . Influenza-Unspecified 08/11/2016, 08/05/2017  . Pneumococcal Conjugate-13 10/29/2003, 12/24/2016  . Pneumococcal Polysaccharide-23 08/21/2012  . Tdap  06/28/2012, 06/28/2012  . Zoster 10/28/2008  . Zoster Recombinat (Shingrix) 01/24/2017, 04/26/2017    Diabetes Related Lab Review: Lab Results  Component Value Date   HGBA1C 6.2 (A) 12/21/2018   HGBA1C 5.7 (A) 09/25/2018   HGBA1C 5.9 07/19/2018    Lab Results  Component Value Date   MICROALBUR <0.7 07/19/2018   Lab Results  Component Value Date   CREATININE 0.88 12/21/2018   BUN 21 12/21/2018   NA 139 12/21/2018   K 4.2 12/21/2018   CL 106 12/21/2018   CO2 24 12/21/2018   Lab Results  Component Value Date   CHOL 184 09/25/2018   CHOL 151 07/19/2018   CHOL 178 01/16/2014   Lab Results  Component Value Date   HDL 53.40 09/25/2018   HDL 42.20 07/19/2018   HDL 44.80 01/16/2014   Lab Results  Component Value Date   LDLCALC 100 (H) 09/25/2018   LDLCALC 85 07/19/2018   LDLCALC 93 01/16/2014   Lab Results  Component Value Date   TRIG 153.0 (H) 09/25/2018   TRIG 117.0 07/19/2018   TRIG 199.0 (H) 01/16/2014   Lab Results  Component Value Date   CHOLHDL 3 09/25/2018   CHOLHDL 4 07/19/2018   CHOLHDL 4 01/16/2014   Lab Results  Component Value Date   LDLDIRECT 132.4 07/17/2013   LDLDIRECT 93.8 06/28/2012   The 10-year ASCVD risk score Mikey Bussing DC Jr., et al., 2013) is: 33.8%   Values used to  calculate the score:     Age: 29 years     Sex: Female     Is Non-Hispanic African American: No     Diabetic: Yes     Tobacco smoker: No     Systolic Blood Pressure: 277 mmHg     Is BP treated: Yes     HDL Cholesterol: 53.4 mg/dL     Total Cholesterol: 184 mg/dL I have reviewed the PMH, Fam and Soc history. Patient Active Problem List   Diagnosis Date Noted  . Osteoarthritis of left knee 04/10/2019  . Notalgia paresthetica 09/25/2018  . Chronic tension headaches 08/14/2018  . Menopausal hot flushes 08/14/2018    On effexor and gabapentin   . Family history of breast cancer in sister 08/14/2018  . At high risk for breast cancer 09/13/2016  . Diet-controlled type 2  diabetes mellitus ([place]) 02/12/2015  . Bilateral hydronephrosis 09/10/2013    Mild per MRI LS spine 02-2013   . GERD (gastroesophageal reflux disease) 06/28/2012  . Hyperlipidemia 06/28/2012  . Hypothyroidism 06/28/2012  . History of cluster headache 06/28/2012  . OSA (obstructive sleep apnea) 05/24/2012    HST 2013:  AHI 12/hr   . HLP (hyperkeratosis lenticularis perstans)   . Essential tremor 03/20/2012    Sees Dr. Carles Collet, neurology     Social History: Patient  reports that she has never smoked. She has never used smokeless tobacco. She reports that she does not drink alcohol or use drugs.  Review of Systems: Ophthalmic: negative for eye pain, loss of vision or double vision Cardiovascular: negative for chest pain Respiratory: negative for SOB or persistent cough Gastrointestinal: negative for abdominal pain Genitourinary: negative for dysuria or gross hematuria MSK: negative for foot lesions Neurologic: negative for weakness or gait disturbance  Objective  Vitals: BP 122/74   Pulse 61   Temp 98 F (36.7 C) (Tympanic)   Resp 16   Ht 5' 3.5" (1.613 m)   Wt 178 lb 3.2 oz (80.8 kg)   SpO2 94%   BMI 31.07 kg/m  General: well appearing, no acute distress  Psych:  Alert and oriented, normal mood and affect MSK: 1st mcp is very tender with mild swelling. Neg finklesteins. Normal wrist.  Cardiovascular:  Nl S1 and S2, RRR without murmur, gallop or rub. no edema Respiratory:  Good breath sounds bilaterally, CTAB with normal effort, no rales Skin:  Warm, no rashes Neurologic:   Mental status is normal. normal gait Foot exam: no erythema, pallor, or cyanosis visible nl proprioception and sensation to monofilament testing bilaterally, +2 distal pulses bilaterally    Diabetic education: ongoing education regarding chronic disease management for diabetes was given today. We continue to reinforce the ABC's of diabetic management: A1c (<7 or 8 dependent upon patient), tight blood  pressure control, and cholesterol management with goal LDL < 100 minimally. We discuss diet strategies, exercise recommendations, medication options and possible side effects. At each visit, we review recommended immunizations and preventive care recommendations for diabetics and stress that good diabetic control can prevent other problems. See below for this patient's data.    Commons side effects, risks, benefits, and alternatives for medications and treatment plan prescribed today were discussed, and the patient expressed understanding of the given instructions. Patient is instructed to call or message via MyChart if he/she has any questions or concerns regarding our treatment plan. No barriers to understanding were identified. We discussed Red Flag symptoms and signs in detail. Patient expressed understanding regarding what to do in  case of urgent or emergency type symptoms.   Medication list was reconciled, printed and provided to the patient in AVS. Patient instructions and summary information was reviewed with the patient as documented in the AVS. This note was prepared with assistance of Dragon voice recognition software. Occasional wrong-word or sound-a-like substitutions may have occurred due to the inherent limitations of voice recognition software

## 2019-05-21 NOTE — Patient Instructions (Signed)
Please return in December 2020 for your annual complete physical; please come fasting.   If you have any questions or concerns, please don't hesitate to send me a message via MyChart or call the office at [phone]. Thank you for visiting with [place] today! It's our pleasure caring for you.

## 2019-06-19 ENCOUNTER — Other Ambulatory Visit: Payer: Self-pay | Admitting: Neurology

## 2019-06-19 ENCOUNTER — Other Ambulatory Visit: Payer: Self-pay | Admitting: Family Medicine

## 2019-06-19 NOTE — Telephone Encounter (Signed)
Requested Prescriptions   Pending Prescriptions Disp Refills  . primidone (MYSOLINE) 50 MG tablet [Pharmacy Med Name: primidone 50 mg tablet] 540 tablet 1    Sig: Take 3 tablets (150 mg total) by mouth 2 (two) times daily.   Rx last filled: 12/13/18 #540 1 refills  Pt last seen: 12/13/18   Follow up appt scheduled: NONE

## 2019-06-27 ENCOUNTER — Other Ambulatory Visit: Payer: Self-pay | Admitting: Family Medicine

## 2019-07-18 ENCOUNTER — Ambulatory Visit (INDEPENDENT_AMBULATORY_CARE_PROVIDER_SITE_OTHER): Payer: PPO

## 2019-07-18 ENCOUNTER — Other Ambulatory Visit: Payer: Self-pay

## 2019-07-18 DIAGNOSIS — Z23 Encounter for immunization: Secondary | ICD-10-CM

## 2019-09-10 ENCOUNTER — Other Ambulatory Visit: Payer: Self-pay

## 2019-09-11 ENCOUNTER — Encounter: Payer: Self-pay | Admitting: Family Medicine

## 2019-09-11 ENCOUNTER — Ambulatory Visit (INDEPENDENT_AMBULATORY_CARE_PROVIDER_SITE_OTHER): Payer: PPO | Admitting: Family Medicine

## 2019-09-11 ENCOUNTER — Ambulatory Visit (INDEPENDENT_AMBULATORY_CARE_PROVIDER_SITE_OTHER): Payer: PPO

## 2019-09-11 ENCOUNTER — Encounter

## 2019-09-11 VITALS — BP 118/83 | HR 68 | Temp 97.6°F | Ht 63.5 in | Wt 184.6 lb

## 2019-09-11 DIAGNOSIS — S8992XA Unspecified injury of left lower leg, initial encounter: Secondary | ICD-10-CM | POA: Diagnosis not present

## 2019-09-11 DIAGNOSIS — S8002XA Contusion of left knee, initial encounter: Secondary | ICD-10-CM | POA: Diagnosis not present

## 2019-09-11 DIAGNOSIS — M1712 Unilateral primary osteoarthritis, left knee: Secondary | ICD-10-CM | POA: Diagnosis not present

## 2019-09-11 DIAGNOSIS — E119 Type 2 diabetes mellitus without complications: Secondary | ICD-10-CM | POA: Diagnosis not present

## 2019-09-11 DIAGNOSIS — E782 Mixed hyperlipidemia: Secondary | ICD-10-CM | POA: Diagnosis not present

## 2019-09-11 LAB — MICROALBUMIN / CREATININE URINE RATIO
Creatinine,U: 164.6 mg/dL
Microalb Creat Ratio: 0.7 mg/g (ref 0.0–30.0)
Microalb, Ur: 1.2 mg/dL (ref 0.0–1.9)

## 2019-09-11 LAB — POCT GLYCOSYLATED HEMOGLOBIN (HGB A1C): Hemoglobin A1C: 5.9 % — AB (ref 4.0–5.6)

## 2019-09-11 NOTE — Patient Instructions (Signed)
Please return in 3 months for your annual complete physical; please come fasting.  Medicare recommends an Annual Wellness Visit for all patients. Yours is now due. Please schedule this to be done with our Nurse Educator, Courtney. This is an informative "talk" visit; it's goals are to ensure that your health care needs are being met and to give you education regarding avoiding falls, ensuring you are not suffering from depression or problems with memory or thinking, and to educate you on Advance Care Planning. It helps me take good care of you!  If you have any questions or concerns, please don't hesitate to send me a message via MyChart or call the office at [phone]. Thank you for visiting with us today! It's our pleasure caring for you.   

## 2019-09-11 NOTE — Progress Notes (Signed)
Subjective  CC:  Chief Complaint  Patient presents with  . L knee pain    s/p fall on 11/25.  Pain somewhat better today  . Diabetes  . Hyperlipidemia  . Hypothyroidism    HPI: Laura Farrell is a 73 y.o. female who presents to the office today to address the problems listed above in the chief complaint.  [place] 11/25. Tripped over 3x5 piece of wood landing directly onto hands and knees; left knee landing on the piece of wood. She had acute knee pain with swelling and limp. Has OA in that knee as well. Sees ortho for management. Needs replacement but holding off. Prior to fall, knee was doing well with rare pain. Has bruising. Swelling as improved. No locking or giveway. Less pain now. Left palm was sore as well but that has improved. No other injuries.     Diabetes follow up: Her diabetic control is reported as Unchanged. Diet controlled w/o compliations. Has gained a few pounds. 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  She denies exertional CP or SOB or symptomatic hypoglycemia. She denies foot sores or paresthesias.  HLD on statin.   Low thyroid: normal energy. Last checked in march and was at goal.   Immunization History  Administered Date(s) Administered  . Fluad Quad(high Dose 65+) 07/18/2019  . Influenza Split 09/11/2011, 06/28/2012, 08/01/2014  . Influenza, High Dose Seasonal PF 08/02/2013, 08/28/2015, 05/31/2017, 07/19/2018  . Influenza, Quadrivalent, Recombinant, Inj, Pf 08/05/2017  . Influenza,inj,quad, With Preservative 08/05/2017  . Influenza-Unspecified 08/11/2016, 08/05/2017  . Pneumococcal Conjugate-13 10/29/2003, 12/24/2016  . Pneumococcal Polysaccharide-23 08/21/2012  . Tdap 06/28/2012, 06/28/2012  . Zoster 10/28/2008  . Zoster Recombinat (Shingrix) 01/24/2017, 04/26/2017    Diabetes Related Lab Review: Lab Results  Component Value Date   HGBA1C 5.9 (A) 09/11/2019   HGBA1C 6.2 (A) 12/21/2018   HGBA1C 5.7 (A) 09/25/2018    Lab Results   Component Value Date   MICROALBUR 1.2 09/11/2019   Lab Results  Component Value Date   CREATININE 0.88 12/21/2018   BUN 21 12/21/2018   NA 139 12/21/2018   K 4.2 12/21/2018   CL 106 12/21/2018   CO2 24 12/21/2018   Lab Results  Component Value Date   CHOL 184 09/25/2018   CHOL 151 07/19/2018   CHOL 178 01/16/2014   Lab Results  Component Value Date   HDL 53.40 09/25/2018   HDL 42.20 07/19/2018   HDL 44.80 01/16/2014   Lab Results  Component Value Date   LDLCALC 100 (H) 09/25/2018   LDLCALC 85 07/19/2018   LDLCALC 93 01/16/2014   Lab Results  Component Value Date   TRIG 153.0 (H) 09/25/2018   TRIG 117.0 07/19/2018   TRIG 199.0 (H) 01/16/2014   Lab Results  Component Value Date   CHOLHDL 3 09/25/2018   CHOLHDL 4 07/19/2018   CHOLHDL 4 01/16/2014   Lab Results  Component Value Date   LDLDIRECT 132.4 07/17/2013   LDLDIRECT 93.8 06/28/2012   The 10-year ASCVD risk score Mikey Bussing DC Jr., et al., 2013) is: 32%   Values used to calculate the score:     Age: 82 years     Sex: Female     Is Non-Hispanic African American: No     Diabetic: Yes     Tobacco smoker: No     Systolic Blood Pressure: 540 mmHg     Is BP treated: Yes     HDL Cholesterol: 53.4 mg/dL  Total Cholesterol: 184 mg/dL  BP Readings from Last 3 Encounters:  09/11/19 118/83  05/21/19 122/74  12/13/18 102/78   Wt Readings from Last 3 Encounters:  09/11/19 184 lb 9.6 oz (83.7 kg)  05/21/19 178 lb 3.2 oz (80.8 kg)  12/13/18 180 lb (81.6 kg)    Health Maintenance  Topic Date Due  . HEMOGLOBIN A1C  06/23/2019  . URINE MICROALBUMIN  07/20/2019  . OPHTHALMOLOGY EXAM  09/26/2019  . MAMMOGRAM  12/18/2019  . COLONOSCOPY  04/15/2020  . FOOT EXAM  05/20/2020  . DEXA SCAN  09/12/2020  . TETANUS/TDAP  06/28/2022  . INFLUENZA VACCINE  Completed  . Hepatitis C Screening  Completed  . PNA vac Low Risk Adult  Completed    Assessment  1. Contusion of left knee, initial encounter   2.  Diet-controlled type 2 diabetes mellitus ([place])   3. Primary osteoarthritis of left knee   4. Mixed hyperlipidemia      Plan   Knee contusion:  No serious internal derangement. Small effusion present. Xray to r/o patellar fracture. Supportive care, nsaids, and time.   DM: remains diet controlled. Discussed diet changes. Difficult due to her husband's strong preferences.   OA: flared with fall. Ice and nsaids.   HLD: stable on statin  Follow up: Return in about 3 months (around 12/10/2019) for complete physical, follow up Diabetes.  Visit date not found  Orders Placed This Encounter  Procedures  . DG Knee AP/LAT W/[place] Left (Use Per Paulla Fore if unsure which knee order to select)  . Urine MAC  . A1C POCT   No orders of the defined types were placed in this encounter.     I reviewed the patients updated PMH, FH, and SocHx.    Patient Active Problem List   Diagnosis Date Noted  . Family history of breast cancer in sister 08/14/2018    Priority: High  . Diet-controlled type 2 diabetes mellitus ([place]) 02/12/2015    Priority: High  . Hyperlipidemia 06/28/2012    Priority: High  . Hypothyroidism 06/28/2012    Priority: High  . OSA (obstructive sleep apnea) 05/24/2012    Priority: High  . Essential tremor 03/20/2012    Priority: High  . Osteoarthritis of left knee 04/10/2019    Priority: Medium  . Chronic tension headaches 08/14/2018    Priority: Medium  . Menopausal hot flushes 08/14/2018    Priority: Medium  . Bilateral hydronephrosis 09/10/2013    Priority: Medium  . GERD (gastroesophageal reflux disease) 06/28/2012    Priority: Medium  . History of cluster headache 06/28/2012    Priority: Medium  . Notalgia paresthetica 09/25/2018    Priority: Low  . HLP (hyperkeratosis lenticularis perstans)     Priority: Low  . At high risk for breast cancer 09/13/2016   Current Meds  Medication Sig  . aspirin 81 MG tablet Take 81 mg by mouth daily.  Marland Kitchen atorvastatin (LIPITOR)  80 MG tablet Take 1 tablet (80 mg total) by mouth daily.  . diclofenac sodium (VOLTAREN) 1 % GEL Apply 2 g topically 4 (four) times daily.  . fenofibrate 54 MG tablet TAKE ONE TABLET BY MOUTH DAILY WITH MEALS  . gabapentin (NEURONTIN) 600 MG tablet Take 600 mg by mouth 2 (two) times daily.  Marland Kitchen levothyroxine (SYNTHROID) 88 MCG tablet Take 1 tablet (88 mcg total) by mouth daily.  Marland Kitchen omeprazole (PRILOSEC) 20 MG capsule Take 1 capsule (20 mg total) by mouth daily.  . prednisoLONE acetate (PRED FORTE) 1 %  ophthalmic suspension PLACE 1 DROP INTO LEFT EYE DAILY  . primidone (MYSOLINE) 50 MG tablet TAKE 3 TABLETS IN AM, 3 TABLETS IN PM  . propranolol (INDERAL) 10 MG tablet TAKE ONE TABLET BY MOUTH THREE TIMES DAILY    Allergies: Patient has No Known Allergies. Family History: Patient family history includes Breast cancer (age of onset: 41) in her sister; Cancer in her father; Pancreatic cancer in her mother; Tremor in her brother, father, mother, and sister. Social History:  Patient  reports that she has never smoked. She has never used smokeless tobacco. She reports that she does not drink alcohol or use drugs.  Review of Systems: Constitutional: Negative for fever malaise or anorexia Cardiovascular: negative for chest pain Respiratory: negative for SOB or persistent cough Gastrointestinal: negative for abdominal pain Wt Readings from Last 3 Encounters:  09/11/19 184 lb 9.6 oz (83.7 kg)  05/21/19 178 lb 3.2 oz (80.8 kg)  12/13/18 180 lb (81.6 kg)    Objective  Vitals: BP 118/83 (BP Location: Left Arm, Patient Position: Sitting, Cuff Size: Large)   Pulse 68   Temp 97.6 F (36.4 C) (Skin)   Ht 5' 3.5" (1.613 m)   Wt 184 lb 9.6 oz (83.7 kg)   SpO2 96%   BMI 32.19 kg/m  General: no acute distress , A&Ox3 HEENT: PEERL, conjunctiva normal, Oropharynx moist,neck is supple Cardiovascular:  RRR without murmur or gallop.  Respiratory:  Good breath sounds bilaterally, CTAB with normal  respiratory effort Skin:  Warm, no rashes Normal gait Left knee: ecchymosis over knee cap. Some ttp over mid patellar. Small effusion w/o warmth or redness. From. Neg joint line ttp. Neg laxity     Commons side effects, risks, benefits, and alternatives for medications and treatment plan prescribed today were discussed, and the patient expressed understanding of the given instructions. Patient is instructed to call or message via MyChart if he/she has any questions or concerns regarding our treatment plan. No barriers to understanding were identified. We discussed Red Flag symptoms and signs in detail. Patient expressed understanding regarding what to do in case of urgent or emergency type symptoms.   Medication list was reconciled, printed and provided to the patient in AVS. Patient instructions and summary information was reviewed with the patient as documented in the AVS. This note was prepared with assistance of Dragon voice recognition software. Occasional wrong-word or sound-a-like substitutions may have occurred due to the inherent limitations of voice recognition software  This visit occurred during the SARS-CoV-2 public health emergency.  Safety protocols were in place, including screening questions prior to the visit, additional usage of staff PPE, and extensive cleaning of exam room while observing appropriate contact time as indicated for disinfecting solutions.

## 2019-09-12 DIAGNOSIS — Z961 Presence of intraocular lens: Secondary | ICD-10-CM | POA: Diagnosis not present

## 2019-09-12 DIAGNOSIS — T85398D Other mechanical complication of other ocular prosthetic devices, implants and grafts, subsequent encounter: Secondary | ICD-10-CM | POA: Diagnosis not present

## 2019-09-12 DIAGNOSIS — Z947 Corneal transplant status: Secondary | ICD-10-CM | POA: Diagnosis not present

## 2019-09-15 ENCOUNTER — Other Ambulatory Visit: Payer: Self-pay | Admitting: Family Medicine

## 2019-09-17 ENCOUNTER — Other Ambulatory Visit: Payer: Self-pay | Admitting: Family Medicine

## 2019-09-19 ENCOUNTER — Other Ambulatory Visit: Payer: Self-pay | Admitting: Family Medicine

## 2019-11-05 ENCOUNTER — Ambulatory Visit: Payer: PPO | Attending: Internal Medicine

## 2019-11-05 DIAGNOSIS — Z23 Encounter for immunization: Secondary | ICD-10-CM | POA: Insufficient documentation

## 2019-11-05 NOTE — Progress Notes (Signed)
   Covid-19 Vaccination Clinic  Name:  Laura Farrell    MRN: [id number] DOB: [birth year]  11/05/2019  [name] was observed post Covid-19 immunization for 15 minutes without incidence. She was provided with Vaccine Information Sheet and instruction to access the V-Safe system.   [name] was instructed to call 911 with any severe reactions post vaccine: Marland Kitchen Difficulty breathing  . Swelling of your face and throat  . A fast heartbeat  . A bad rash all over your body  . Dizziness and weakness    Immunizations Administered    Name Date Dose VIS Date Route   Pfizer COVID-19 Vaccine 11/05/2019  9:25 AM 0.3 mL 09/21/2019 Intramuscular   Manufacturer: [place]   Lot: [id number]   [place]: [id number]

## 2019-11-07 DIAGNOSIS — M545 Low back pain: Secondary | ICD-10-CM | POA: Diagnosis not present

## 2019-11-07 DIAGNOSIS — M4316 Spondylolisthesis, lumbar region: Secondary | ICD-10-CM | POA: Diagnosis not present

## 2019-11-07 DIAGNOSIS — M4310 Spondylolisthesis, site unspecified: Secondary | ICD-10-CM | POA: Diagnosis not present

## 2019-11-07 DIAGNOSIS — M5136 Other intervertebral disc degeneration, lumbar region: Secondary | ICD-10-CM | POA: Diagnosis not present

## 2019-11-07 DIAGNOSIS — M5416 Radiculopathy, lumbar region: Secondary | ICD-10-CM | POA: Diagnosis not present

## 2019-11-13 DIAGNOSIS — M4310 Spondylolisthesis, site unspecified: Secondary | ICD-10-CM | POA: Diagnosis not present

## 2019-11-16 ENCOUNTER — Other Ambulatory Visit: Payer: Self-pay | Admitting: Family Medicine

## 2019-11-16 DIAGNOSIS — Z1231 Encounter for screening mammogram for malignant neoplasm of breast: Secondary | ICD-10-CM

## 2019-11-17 ENCOUNTER — Other Ambulatory Visit: Payer: Self-pay | Admitting: Family Medicine

## 2019-11-22 DIAGNOSIS — M5126 Other intervertebral disc displacement, lumbar region: Secondary | ICD-10-CM | POA: Diagnosis not present

## 2019-11-22 DIAGNOSIS — M4310 Spondylolisthesis, site unspecified: Secondary | ICD-10-CM | POA: Diagnosis not present

## 2019-11-26 ENCOUNTER — Ambulatory Visit: Payer: PPO | Attending: Internal Medicine

## 2019-11-26 DIAGNOSIS — M4316 Spondylolisthesis, lumbar region: Secondary | ICD-10-CM | POA: Diagnosis not present

## 2019-11-26 DIAGNOSIS — Z23 Encounter for immunization: Secondary | ICD-10-CM | POA: Insufficient documentation

## 2019-11-26 DIAGNOSIS — M545 Low back pain: Secondary | ICD-10-CM | POA: Diagnosis not present

## 2019-11-26 DIAGNOSIS — M5416 Radiculopathy, lumbar region: Secondary | ICD-10-CM | POA: Diagnosis not present

## 2019-11-26 DIAGNOSIS — R030 Elevated blood-pressure reading, without diagnosis of hypertension: Secondary | ICD-10-CM | POA: Diagnosis not present

## 2019-11-26 NOTE — Progress Notes (Signed)
   Covid-19 Vaccination Clinic  Name:  Laura Farrell    MRN: [id number] DOB: 14-Oct-1945  11/26/2019  Laura Farrell was observed post Covid-19 immunization for 15 minutes without incidence. She was provided with Vaccine Information Sheet and instruction to access the V-Safe system.   Laura Farrell was instructed to call 911 with any severe reactions post vaccine: Marland Kitchen Difficulty breathing  . Swelling of your face and throat  . A fast heartbeat  . A bad rash all over your body  . Dizziness and weakness    Immunizations Administered    Name Date Dose VIS Date Route   Pfizer COVID-19 Vaccine 11/26/2019  9:30 AM 0.3 mL 09/21/2019 Intramuscular   Manufacturer: [place]   Lot: [id number]   [place]: [id number]

## 2019-12-12 DIAGNOSIS — R030 Elevated blood-pressure reading, without diagnosis of hypertension: Secondary | ICD-10-CM | POA: Diagnosis not present

## 2019-12-12 DIAGNOSIS — M4126 Other idiopathic scoliosis, lumbar region: Secondary | ICD-10-CM | POA: Diagnosis not present

## 2019-12-12 DIAGNOSIS — M545 Low back pain: Secondary | ICD-10-CM | POA: Diagnosis not present

## 2019-12-12 DIAGNOSIS — M5416 Radiculopathy, lumbar region: Secondary | ICD-10-CM | POA: Diagnosis not present

## 2019-12-17 ENCOUNTER — Other Ambulatory Visit: Payer: Self-pay | Admitting: Neurology

## 2019-12-17 NOTE — Telephone Encounter (Signed)
Rx(s) sent to pharmacy electronically.  

## 2019-12-20 ENCOUNTER — Other Ambulatory Visit: Payer: Self-pay | Admitting: Family Medicine

## 2019-12-25 ENCOUNTER — Other Ambulatory Visit: Payer: Self-pay

## 2019-12-25 ENCOUNTER — Ambulatory Visit
Admission: RE | Admit: 2019-12-25 | Discharge: 2019-12-25 | Disposition: A | Discharge status: Home / Self Care | Payer: PPO | Source: Ambulatory Visit | Attending: Family Medicine | Admitting: Family Medicine

## 2019-12-25 DIAGNOSIS — Z1231 Encounter for screening mammogram for malignant neoplasm of breast: Secondary | ICD-10-CM | POA: Diagnosis not present

## 2019-12-27 ENCOUNTER — Ambulatory Visit (INDEPENDENT_AMBULATORY_CARE_PROVIDER_SITE_OTHER): Payer: PPO | Admitting: Family Medicine

## 2019-12-27 ENCOUNTER — Other Ambulatory Visit: Payer: Self-pay

## 2019-12-27 ENCOUNTER — Encounter: Payer: Self-pay | Admitting: Family Medicine

## 2019-12-27 ENCOUNTER — Ambulatory Visit (INDEPENDENT_AMBULATORY_CARE_PROVIDER_SITE_OTHER): Payer: PPO

## 2019-12-27 VITALS — BP 116/78 | HR 75 | Temp 97.3°F | Ht 63.5 in | Wt 179.2 lb

## 2019-12-27 VITALS — BP 116/78 | HR 75 | Temp 97.3°F | Ht 64.0 in | Wt 179.2 lb

## 2019-12-27 DIAGNOSIS — Z0000 Encounter for general adult medical examination without abnormal findings: Secondary | ICD-10-CM | POA: Diagnosis not present

## 2019-12-27 DIAGNOSIS — E039 Hypothyroidism, unspecified: Secondary | ICD-10-CM

## 2019-12-27 DIAGNOSIS — D126 Benign neoplasm of colon, unspecified: Secondary | ICD-10-CM | POA: Insufficient documentation

## 2019-12-27 DIAGNOSIS — G4733 Obstructive sleep apnea (adult) (pediatric): Secondary | ICD-10-CM

## 2019-12-27 DIAGNOSIS — G250 Essential tremor: Secondary | ICD-10-CM | POA: Diagnosis not present

## 2019-12-27 DIAGNOSIS — N951 Menopausal and female climacteric states: Secondary | ICD-10-CM

## 2019-12-27 DIAGNOSIS — R7303 Prediabetes: Secondary | ICD-10-CM

## 2019-12-27 DIAGNOSIS — K219 Gastro-esophageal reflux disease without esophagitis: Secondary | ICD-10-CM

## 2019-12-27 DIAGNOSIS — E782 Mixed hyperlipidemia: Secondary | ICD-10-CM

## 2019-12-27 HISTORY — DX: Benign neoplasm of colon, unspecified: D12.6

## 2019-12-27 HISTORY — DX: Prediabetes: R73.03

## 2019-12-27 LAB — COMPREHENSIVE METABOLIC PANEL
ALT: 25 U/L (ref 0–35)
AST: 27 U/L (ref 0–37)
Albumin: 4.1 g/dL (ref 3.5–5.2)
Alkaline Phosphatase: 68 U/L (ref 39–117)
BUN: 16 mg/dL (ref 6–23)
CO2: 25 mEq/L (ref 19–32)
Calcium: 9.6 mg/dL (ref 8.4–10.5)
Chloride: 107 mEq/L (ref 96–112)
Creatinine, Ser: 0.74 mg/dL (ref 0.40–1.20)
GFR: 76.29 mL/min (ref >60.00)
Glucose, Bld: 112 mg/dL — ABNORMAL HIGH (ref 70–99)
Potassium: 4.3 mEq/L (ref 3.5–5.1)
Sodium: 141 mEq/L (ref 135–145)
Total Bilirubin: 0.3 mg/dL (ref 0.2–1.2)
Total Protein: 6.8 g/dL (ref 6.0–8.3)

## 2019-12-27 LAB — CBC WITH DIFFERENTIAL/PLATELET
Basophils Absolute: 0 10*3/uL (ref 0.0–0.1)
Basophils Relative: 0.4 % (ref 0.0–3.0)
Eosinophils Absolute: 0.1 10*3/uL (ref 0.0–0.7)
Eosinophils Relative: 2.6 % (ref 0.0–5.0)
HCT: 39.2 % (ref 36.0–46.0)
Hemoglobin: 13 g/dL (ref 12.0–15.0)
Lymphocytes Relative: 51 % — ABNORMAL HIGH (ref 12.0–46.0)
Lymphs Abs: 2.6 10*3/uL (ref 0.7–4.0)
MCHC: 33.1 g/dL (ref 30.0–36.0)
MCV: 90.1 fl (ref 78.0–100.0)
Monocytes Absolute: 0.3 10*3/uL (ref 0.1–1.0)
Monocytes Relative: 6.4 % (ref 3.0–12.0)
Neutro Abs: 2 10*3/uL (ref 1.4–7.7)
Neutrophils Relative %: 39.6 % — ABNORMAL LOW (ref 43.0–77.0)
Platelets: 168 10*3/uL (ref 150.0–400.0)
RBC: 4.35 Mil/uL (ref 3.87–5.11)
RDW: 13.6 % (ref 11.5–15.5)
WBC: 5 10*3/uL (ref 4.0–10.5)

## 2019-12-27 LAB — POCT GLYCOSYLATED HEMOGLOBIN (HGB A1C): Hemoglobin A1C: 5.9 % — AB (ref 4.0–5.6)

## 2019-12-27 LAB — LIPID PANEL
Cholesterol: 170 mg/dL (ref 0–200)
HDL: 44.5 mg/dL (ref >39.00)
LDL Cholesterol: 97 mg/dL (ref 0–99)
NonHDL: 125.81
Total CHOL/HDL Ratio: 4
Triglycerides: 144 mg/dL (ref 0.0–149.0)
VLDL: 28.8 mg/dL (ref 0.0–40.0)

## 2019-12-27 LAB — TSH: TSH: 2.36 u[IU]/mL (ref 0.35–4.50)

## 2019-12-27 NOTE — Progress Notes (Signed)
Subjective:   Laura Farrell is a 74 y.o. female who presents for Medicare Annual (Subsequent) preventive examination.  Review of Systems:   Cardiac Risk Factors include: advanced age (>36men, >60 women);diabetes mellitus;dyslipidemia    Objective:     Vitals: BP 116/78   Pulse 75   Temp (!) 97.3 F (36.3 C) (Temporal)   Ht 5\' 4"  (1.626 m)   Wt 179 lb 3.7 oz (81.3 kg)   SpO2 94%   BMI 30.77 kg/m   Body mass index is 30.77 kg/m.  Advanced Directives 12/27/2019 12/13/2018 09/15/2018 09/13/2016 09/12/2014  Does Patient Have a Medical Advance Directive? Yes Yes Yes Yes Yes  Type of Paramedic of [place];Living will - [place];Living will [place];Living will [place];Living will  Does patient want to make changes to medical advance directive? No - Patient declined - - - -  Copy of [place] in Chart? No - copy requested - No - copy requested - No - copy requested    Tobacco Social History   Tobacco Use  Smoking Status Never Smoker  Smokeless Tobacco Never Used     Counseling given: Not Answered   Clinical Intake:  Pre-visit preparation completed: Yes  Pain : No/denies pain  Diabetes: Yes(Labs today) CBG done?: No Did pt. bring in CBG monitor from home?: No  How often do you need to have someone help you when you read instructions, pamphlets, or other written materials from your doctor or pharmacy?: 1 - Never  Interpreter Needed?: No  Information entered by :: Lisette Abu, LPN  Past Medical History:  Diagnosis Date  . Chronic tension headaches 08/14/2018  . Diabetes mellitus   . Headache(784.0)   . HLP (hyperkeratosis lenticularis perstans)   . Hot flashes   . Hyperlipidemia   . Menopausal hot flushes 08/14/2018   On effexor and gabapentin  . Nephrolithiasis   . Over weight   . Thyroid disease   . Tubular adenoma of colon 12/27/2019   Colonoscopy,  Dr. Cristina Gong, 02/2015: tubular adenoma, 5 year recall  . Urine incontinence    Past Surgical History:  Procedure Laterality Date  . ABDOMINAL HYSTERECTOMY    . APPENDECTOMY    . BREAST EXCISIONAL BIOPSY Left   . BREAST EXCISIONAL BIOPSY Left   . BREAST EXCISIONAL BIOPSY Left   . CATARACT EXTRACTION Right 2017  . cornea implant    . left shoulder    . lower back surgery    . [place]  2005  . tubal ligation  1980  . VESICOVAGINAL FISTULA CLOSURE W/ TAH  1995   Family History  Problem Relation Age of Onset  . Tremor Mother   . Pancreatic cancer Mother   . Tremor Father   . Cancer Father        lung  . Breast cancer Sister 39  . Tremor Sister   . Tremor Brother    Social History   Socioeconomic History  . Marital status: Married    Spouse name: Not on file  . Number of children: Not on file  . Years of education: Not on file  . Highest education level: Not on file  Occupational History  . Occupation: Retired Transport planner: RETIRED  Tobacco Use  . Smoking status: Never Smoker  . Smokeless tobacco: Never Used  Substance and Sexual Activity  . Alcohol use: No  . Drug use: No  .  Sexual activity: Not on file  Other Topics Concern  . Not on file  Social History Narrative   Hobbies:  Enjoys outside sports (snowboarding)    Social Determinants of Health   Financial Resource Strain:   . Difficulty of Paying Living Expenses:   Food Insecurity:   . Worried About Charity fundraiser in the Last Year:   . Arboriculturist in the Last Year:   Transportation Needs:   . Film/video editor (Medical):   Marland Kitchen Lack of Transportation (Non-Medical):   Physical Activity:   . Days of Exercise per Week:   . Minutes of Exercise per Session:   Stress:   . Feeling of Stress :   Social Connections:   . Frequency of Communication with Friends and Family:   . Frequency of Social Gatherings with Friends and Family:   . Attends Religious Services:   . Active Member of  Clubs or Organizations:   . Attends Archivist Meetings:   Marland Kitchen Marital Status:     Outpatient Encounter Medications as of 12/27/2019  Medication Sig  . aspirin 81 MG tablet Take 81 mg by mouth daily.  Marland Kitchen atorvastatin (LIPITOR) 80 MG tablet Take 1 tablet (80 mg total) by mouth daily.  . diclofenac sodium (VOLTAREN) 1 % GEL Apply 2 g topically 4 (four) times daily.  . diclofenac Sodium (VOLTAREN) 1 % GEL Apply 1 application topically as needed.  . fenofibrate 54 MG tablet TAKE ONE TABLET BY MOUTH DAILY WITH MEALS  . gabapentin (NEURONTIN) 600 MG tablet Take 600 mg by mouth 2 (two) times daily.  Marland Kitchen levothyroxine (SYNTHROID) 88 MCG tablet Take 1 tablet (88 mcg total) by mouth daily.  Marland Kitchen omeprazole (PRILOSEC) 20 MG capsule Take 1 capsule (20 mg total) by mouth daily.  . prednisoLONE acetate (PRED FORTE) 1 % ophthalmic suspension PLACE 1 DROP INTO LEFT EYE DAILY  . primidone (MYSOLINE) 50 MG tablet TAKE THREE TABLETS BY MOUTH EVERY MORNING AND 3 TABLETS BY MOUTH EVERY EVENING  . propranolol (INDERAL) 10 MG tablet TAKE ONE TABLET BY MOUTH THREE TIMES DAILY  . traMADol (ULTRAM) 50 MG tablet Take 50 mg by mouth every 6 (six) hours as needed.   No facility-administered encounter medications on file as of 12/27/2019.    Activities of Daily Living In your present state of health, do you have any difficulty performing the following activities: 12/27/2019  Hearing? N  Vision? N  Difficulty concentrating or making decisions? N  Walking or climbing stairs? N  Dressing or bathing? N  Doing errands, shopping? N  Preparing Food and eating ? N  Using the Toilet? N  In the past six months, have you accidently leaked urine? N  Do you have problems with loss of bowel control? N  Managing your Medications? N  Managing your Finances? N  Housekeeping or managing your Housekeeping? N  Some recent data might be hidden    Patient Care Team: Leamon Arnt, MD as PCP - General (Family  Medicine) Ronald Lobo, MD as Consulting Physician (Gastroenterology) Warren Danes, PA-C as Physician Assistant (Dermatology) Manson Passey, [place] (Specialist) Marilynne Halsted, MD as Referring Physician (Ophthalmology) Tat, Eustace Quail, DO as Consulting Physician (Neurology) Gordy Levan, MD (Oncology) Sydnee Cabal, MD as Consulting Physician (Orthopedic Surgery) Erline Levine, MD as Consulting Physician (Neurosurgery)    Assessment:   This is a routine wellness examination for [place].  Exercise Activities and Dietary recommendations Current Exercise Habits: Home exercise routine, Type of  exercise: Other - see comments;walking(outside activities), Time (Minutes): 30, Frequency (Times/Week): 4, Weekly Exercise (Minutes/Week): 120, Intensity: Moderate  Goals    . Weight (lb) < 165 lb (74.8 kg)     Lose weight by decreasing caloric intake.        Fall Risk Fall Risk  12/27/2019 12/27/2019 12/21/2018 12/13/2018 09/25/2018  Falls in the past year? 0 0 0 0 0  Number falls in past yr: 0 - 0 0 -  Injury with Fall? 0 - 0 0 -  Follow up Falls evaluation completed;Education provided;Falls prevention discussed Falls evaluation completed;Education provided - Falls evaluation completed -   Is the patient's home free of loose throw rugs in walkways, pet beds, electrical cords, etc?   yes      Grab bars in the bathroom? yes      Handrails on the stairs?   yes      Adequate lighting?   yes  Timed Get Up and Go performed: completed and within normal timeframe; no gait abnormalities noted   Depression Screen PHQ 2/9 Scores 12/27/2019 12/27/2019 12/21/2018 09/25/2018  PHQ - 2 Score 0 0 0 0     Cognitive Function MMSE - Mini Mental State Exam 09/15/2018  Orientation to time 5  Orientation to Place 5  Registration 3  Attention/ Calculation 5  Recall 2  Language- name 2 objects 2  Language- repeat 1  Language- follow 3 step command 3  Language- read & follow direction 1  Write a  sentence 1  Copy design 1  Total score 29     6CIT Screen 12/27/2019  What Year? 0 points  What month? 0 points  What time? 0 points  Count back from 20 0 points  Months in reverse 0 points  Repeat phrase 0 points  Total Score 0    Immunization History  Administered Date(s) Administered  . Fluad Quad(high Dose 65+) 07/18/2019  . Influenza Split 09/11/2011, 06/28/2012, 08/01/2014  . Influenza, High Dose Seasonal PF 08/02/2013, 08/28/2015, 05/31/2017, 07/19/2018  . Influenza, Quadrivalent, Recombinant, Inj, Pf 08/05/2017  . Influenza,inj,quad, With Preservative 08/05/2017  . Influenza-Unspecified 08/11/2016, 08/05/2017  . PFIZER SARS-COV-2 Vaccination 11/05/2019, 11/26/2019  . Pneumococcal Conjugate-13 10/29/2003, 12/24/2016  . Pneumococcal Polysaccharide-23 08/21/2012  . Tdap 06/28/2012, 06/28/2012  . Zoster 10/28/2008  . Zoster Recombinat (Shingrix) 01/24/2017, 04/26/2017    Qualifies for Shingles Vaccine? Shingrix completed   Screening Tests Health Maintenance  Topic Date Due  . COLONOSCOPY  04/15/2020  . FOOT EXAM  05/20/2020  . HEMOGLOBIN A1C  06/28/2020  . URINE MICROALBUMIN  09/10/2020  . DEXA SCAN  09/12/2020  . OPHTHALMOLOGY EXAM  09/29/2020  . MAMMOGRAM  12/24/2020  . TETANUS/TDAP  06/28/2022  . INFLUENZA VACCINE  Completed  . Hepatitis C Screening  Completed  . PNA vac Low Risk Adult  Completed    Cancer Screenings: Lung: Low Dose CT Chest recommended if Age 28-80 years, 30 pack-year currently smoking OR have quit w/in 15years. Patient does not qualify. Breast:  Up to date on Mammogram? Yes   Up to date of Bone Density/Dexa? Yes Colorectal: colonoscopy 04/15/18 (repeat in 5 years)     Plan:  I have personally reviewed and addressed the Medicare Annual Wellness questionnaire and have noted the following in the patient's chart:  A. Medical and social history B. Use of alcohol, tobacco or illicit drugs  C. Current medications and  supplements D. Functional ability and status E.  Nutritional status F.  Physical activity G. Advance directives  H. List of other physicians I.  Hospitalizations, surgeries, and ER visits in previous 12 months J.  [place] such as hearing and vision if needed, cognitive and depression L. Referrals, records requested, and appointments- none   In addition, I have reviewed and discussed with patient certain preventive protocols, quality metrics, and best practice recommendations. A written personalized care plan for preventive services as well as general preventive health recommendations were provided to patient.   Signed,  Denman George, LPN  Nurse Health Advisor   Nurse Notes: no additional

## 2019-12-27 NOTE — Patient Instructions (Signed)
[name] , Thank you for taking time to come for your Medicare Wellness Visit. I appreciate your ongoing commitment to your health goals. Please review the following plan we discussed and let me know if I can assist you in the future.   Screening recommendations/referrals: Colorectal Screening: up to date; last colonoscopy 04/15/18  Mammogram: up to date; last 12/25/19 Bone Density: up to date; last 09/12/18    Vision and Dental Exams: Recommended annual ophthalmology exams for early detection of glaucoma and other disorders of the eye Recommended annual dental exams for proper oral hygiene  Diabetic Exams: Diabetic Eye Exam: recommended yearly Diabetic Foot Exam: recommended yearly   Vaccinations: Influenza vaccine: completed 07/18/19 Pneumococcal vaccine: up to date; last 12/24/16 Tdap vaccine: up to date; last 08/02/14   Shingles vaccine: Shingrix completed   Advanced directives: Please bring a copy of your POA (Power of [place]) and/or Living Will to your next appointment.  Goals: Recommend to drink at least 6-8 8oz glasses of water per day and consume a balanced diet rich in fresh fruits and vegetables.   Next appointment: Please schedule your Annual Wellness Visit with your Nurse Health Advisor in one year.  Preventive Care 34 Years and Older, Female Preventive care refers to lifestyle choices and visits with your health care provider that can promote health and wellness. What does preventive care include?  A yearly physical exam. This is also called an annual well check.  Dental exams once or twice a year.  Routine eye exams. Ask your health care provider how often you should have your eyes checked.  Personal lifestyle choices, including:  Daily care of your teeth and gums.  Regular physical activity.  Eating a healthy diet.  Avoiding tobacco and drug use.  Limiting alcohol use.  Practicing safe sex.  Taking low-dose aspirin every day if recommended by your  health care provider.  Taking vitamin and mineral supplements as recommended by your health care provider. What happens during an annual well check? The services and screenings done by your health care provider during your annual well check will depend on your age, overall health, lifestyle risk factors, and family history of disease. Counseling  Your health care provider may ask you questions about your:  Alcohol use.  Tobacco use.  Drug use.  Emotional well-being.  Home and relationship well-being.  Sexual activity.  Eating habits.  History of falls.  Memory and ability to understand (cognition).  Work and work Statistician.  Reproductive health. Screening  You may have the following tests or measurements:  Height, weight, and BMI.  Blood pressure.  Lipid and cholesterol levels. These may be checked every 5 years, or more frequently if you are over 20 years old.  Skin check.  Lung cancer screening. You may have this screening every year starting at age 48 if you have a 30-pack-year history of smoking and currently smoke or have quit within the past 15 years.  Fecal occult blood test (FOBT) of the stool. You may have this test every year starting at age 80.  Flexible sigmoidoscopy or colonoscopy. You may have a sigmoidoscopy every 5 years or a colonoscopy every 10 years starting at age 83.  Hepatitis C blood test.  Hepatitis B blood test.  Sexually transmitted disease (STD) testing.  Diabetes screening. This is done by checking your blood sugar (glucose) after you have not eaten for a while (fasting). You may have this done every 1-3 years.  Bone density scan. This is done to  screen for osteoporosis. You may have this done starting at age 22.  Mammogram. This may be done every 1-2 years. Talk to your health care provider about how often you should have regular mammograms. Talk with your health care provider about your test results, treatment options, and if  necessary, the need for more tests. Vaccines  Your health care provider may recommend certain vaccines, such as:  Influenza vaccine. This is recommended every year.  Tetanus, diphtheria, and acellular pertussis (Tdap, Td) vaccine. You may need a Td booster every 10 years.  Zoster vaccine. You may need this after age 2.  Pneumococcal 13-valent conjugate (PCV13) vaccine. One dose is recommended after age 31.  Pneumococcal polysaccharide (PPSV23) vaccine. One dose is recommended after age 51. Talk to your health care provider about which screenings and vaccines you need and how often you need them. This information is not intended to replace advice given to you by your health care provider. Make sure you discuss any questions you have with your health care provider. Document Released: 10/24/2015 Document Revised: 06/16/2016 Document Reviewed: 07/29/2015 Elsevier Interactive Patient Education  2017 [place] Prevention in the Home Falls can cause injuries. They can happen to people of all ages. There are many things you can do to make your home safe and to help prevent falls. What can I do on the outside of my home?  Regularly fix the edges of walkways and driveways and fix any cracks.  Remove anything that might make you trip as you walk through a door, such as a raised step or threshold.  Trim any bushes or trees on the path to your home.  Use bright outdoor lighting.  Clear any walking paths of anything that might make someone trip, such as rocks or tools.  Regularly check to see if handrails are loose or broken. Make sure that both sides of any steps have handrails.  Any raised decks and porches should have guardrails on the edges.  Have any leaves, snow, or ice cleared regularly.  Use sand or salt on walking paths during winter.  Clean up any spills in your garage right away. This includes oil or grease spills. What can I do in the bathroom?  Use night  lights.  Install grab bars by the toilet and in the tub and shower. Do not use towel bars as grab bars.  Use non-skid mats or decals in the tub or shower.  If you need to sit down in the shower, use a plastic, non-slip stool.  Keep the floor dry. Clean up any water that spills on the floor as soon as it happens.  Remove soap buildup in the tub or shower regularly.  Attach bath mats securely with double-sided non-slip rug tape.  Do not have throw rugs and other things on the floor that can make you trip. What can I do in the bedroom?  Use night lights.  Make sure that you have a light by your bed that is easy to reach.  Do not use any sheets or blankets that are too big for your bed. They should not hang down onto the floor.  Have a firm chair that has side arms. You can use this for support while you get dressed.  Do not have throw rugs and other things on the floor that can make you trip. What can I do in the kitchen?  Clean up any spills right away.  Avoid walking on wet floors.  Keep items that  you use a lot in easy-to-reach places.  If you need to reach something above you, use a strong step stool that has a grab bar.  Keep electrical cords out of the way.  Do not use floor polish or wax that makes floors slippery. If you must use wax, use non-skid floor wax.  Do not have throw rugs and other things on the floor that can make you trip. What can I do with my stairs?  Do not leave any items on the stairs.  Make sure that there are handrails on both sides of the stairs and use them. Fix handrails that are broken or loose. Make sure that handrails are as long as the stairways.  Check any carpeting to make sure that it is firmly attached to the stairs. Fix any carpet that is loose or worn.  Avoid having throw rugs at the top or bottom of the stairs. If you do have throw rugs, attach them to the floor with carpet tape.  Make sure that you have a light switch at the  top of the stairs and the bottom of the stairs. If you do not have them, ask someone to add them for you. What else can I do to help prevent falls?  Wear shoes that:  Do not have high heels.  Have rubber bottoms.  Are comfortable and fit you well.  Are closed at the toe. Do not wear sandals.  If you use a stepladder:  Make sure that it is fully opened. Do not climb a closed stepladder.  Make sure that both sides of the stepladder are locked into place.  Ask someone to hold it for you, if possible.  Clearly mark and make sure that you can see:  Any grab bars or handrails.  First and last steps.  Where the edge of each step is.  Use tools that help you move around (mobility aids) if they are needed. These include:  Canes.  Walkers.  Scooters.  Crutches.  Turn on the lights when you go into a dark area. Replace any light bulbs as soon as they burn out.  Set up your furniture so you have a clear path. Avoid moving your furniture around.  If any of your floors are uneven, fix them.  If there are any pets around you, be aware of where they are.  Review your medicines with your doctor. Some medicines can make you feel dizzy. This can increase your chance of falling. Ask your doctor what other things that you can do to help prevent falls. This information is not intended to replace advice given to you by your health care provider. Make sure you discuss any questions you have with your health care provider. Document Released: 07/24/2009 Document Revised: 03/04/2016 Document Reviewed: 11/01/2014 Elsevier Interactive Patient Education  2017 [employer].

## 2019-12-27 NOTE — Patient Instructions (Signed)
Please return in 6 months for sugar recheck.   I will release your lab results to you on your MyChart account with further instructions. Please reply with any questions.   Things look good!  If you have any questions or concerns, please don't hesitate to send me a message via MyChart or call the office at [phone]. Thank you for visiting with [place] today! It's our pleasure caring for you.

## 2019-12-27 NOTE — Progress Notes (Signed)
Subjective  Chief Complaint  Patient presents with  . Annual Exam    fasting  . Diabetes    does not take any medication for dm. Diet has not been good  . Hyperlipidemia    taking atorvastatin 80 daily    HPI: Laura Farrell is a 74 y.o. female who presents to [facility] today for a Female Wellness Visit. She also has the concerns and/or needs as listed above in the chief complaint. These will be addressed in addition to the Health Maintenance Visit.   Wellness Visit: annual visit with health maintenance review and exam without Pap   HM: mammo nl; AWV to be done today. Colonoscopy due this summer. dexa at the end of this year. Doing great! Went to [place] last week for snowmobiling. imms up to date  Chronic disease f/u and/or acute problem visit: (deemed necessary to be done in addition to the wellness visit):  Prediabetes: continues to try to eat a healthy diet. Weight is stable. No sxs of hyperglycemia. Eye exam is up to date w/o retinopathy records reviewed in care everywhere. On statin.   HLD on statin: due for recheck  Low thyroid: feels tired a lot. No swelling or skin/hair changes. No cold intolerance. No cp or sob. Takes meds daily. Sleep is interrupted. Due for recheck.   Tremor controlled on two meds per neuro  Back Pain: seeing Dr. Vertell Limber, NS; had MRI: will request records. Recommend epidural steroid injections due to DJD changes, lumbar. No stenosis by her report. She has questions about this. Pain is mostly with prolonged standing and limits activities w/o radicular sxs or leg weakness. Wears a back brace and does well with activities (snowmobiling and travel).   GERd on chronic ppis' with fair control.   On neurontin and gabapentin for peristent menopausal hot flushes.   Assessment  1. Annual physical exam   2. Prediabetes   3. Acquired hypothyroidism   4. Essential tremor   5. Mixed hyperlipidemia   6. OSA (obstructive sleep apnea)    7. Gastroesophageal reflux disease without esophagitis   8. Menopausal hot flushes   9. Tubular adenoma of colon      Plan  Female Wellness Visit:  Age appropriate Health Maintenance and Prevention measures were discussed with patient. Included topics are cancer screening recommendations, ways to keep healthy (see AVS) including dietary and exercise recommendations, regular eye and dental care, use of seat belts, and avoidance of moderate alcohol use and tobacco use. Screens up to date: colonoscopy due in July.   BMI: discussed patient's BMI and encouraged positive lifestyle modifications to help get to or maintain a target BMI.  HM needs and immunizations were addressed and ordered. See below for orders. See HM and immunization section for updates. utd  Routine labs and screening tests ordered including cmp, cbc and lipids where appropriate.  Discussed recommendations regarding Vit D and calcium supplementation (see AVS)  Chronic disease management visit and/or acute problem visit:  HLD: recheck today on statin   Low thyroid; has been well controlled. Recheck today and adjust if needed. Discussed improving sleep and back pain control to help with fatigue  Prediabetes: stable. Continue diet.   GERD and tremor are fairly well controlled. No indication for med changes  Continue meds chronic for hot flushes.   Follow up: 6 months for recheck prediabetes  Orders Placed This Encounter  Procedures  . TSH  . CBC with Differential/Platelet  . Comprehensive metabolic  panel  . Lipid panel  . POCT glycosylated hemoglobin (Hb A1C)   No orders of the defined types were placed in this encounter.     Lifestyle: Body mass index is 31.25 kg/m. Wt Readings from Last 3 Encounters:  12/27/19 179 lb 3.7 oz (81.3 kg)  12/27/19 179 lb 3.2 oz (81.3 kg)  09/11/19 184 lb 9.6 oz (83.7 kg)    Patient Active Problem List   Diagnosis Date Noted  . Prediabetes 12/27/2019    Priority: High    . Tubular adenoma of colon 12/27/2019    Priority: High    Colonoscopy, Dr. Cristina Gong, 02/2015: tubular adenoma, 5 year recall   . Family history of breast cancer in sister 08/14/2018    Priority: High  . Hyperlipidemia 06/28/2012    Priority: High  . Hypothyroidism 06/28/2012    Priority: High  . OSA (obstructive sleep apnea) 05/24/2012    Priority: High    HST 2013:  AHI 12/hr   . Essential tremor 03/20/2012    Priority: High    Sees Dr. Carles Collet, neurology   . Osteoarthritis of left knee 04/10/2019    Priority: Medium  . Chronic tension headaches 08/14/2018    Priority: Medium  . Menopausal hot flushes 08/14/2018    Priority: Medium    On effexor and gabapentin   . Bilateral hydronephrosis 09/10/2013    Priority: Medium    Mild per MRI LS spine 02-2013   . GERD (gastroesophageal reflux disease) 06/28/2012    Priority: Medium  . History of cluster headache 06/28/2012    Priority: Medium  . Notalgia paresthetica 09/25/2018    Priority: Low  . HLP (hyperkeratosis lenticularis perstans)     Priority: Low  . At high risk for breast cancer 09/13/2016  . Status post corneal transplant 11/06/2009    Formatting of this note might be different from the original. [facility] Dr. Susa Simmonds    Health Maintenance  Topic Date Due  . COLONOSCOPY  04/15/2020  . FOOT EXAM  05/20/2020  . HEMOGLOBIN A1C  06/28/2020  . URINE MICROALBUMIN  09/10/2020  . DEXA SCAN  09/12/2020  . OPHTHALMOLOGY EXAM  09/29/2020  . MAMMOGRAM  12/24/2020  . TETANUS/TDAP  06/28/2022  . INFLUENZA VACCINE  Completed  . Hepatitis C Screening  Completed  . PNA vac Low Risk Adult  Completed   Immunization History  Administered Date(s) Administered  . Fluad Quad(high Dose 65+) 07/18/2019  . Influenza Split 09/11/2011, 06/28/2012, 08/01/2014  . Influenza, High Dose Seasonal PF 08/02/2013, 08/28/2015, 05/31/2017, 07/19/2018  . Influenza, Quadrivalent, Recombinant, Inj, Pf 08/05/2017  . Influenza,inj,quad, With  Preservative 08/05/2017  . Influenza-Unspecified 08/11/2016, 08/05/2017  . PFIZER SARS-COV-2 Vaccination 11/05/2019, 11/26/2019  . Pneumococcal Conjugate-13 10/29/2003, 12/24/2016  . Pneumococcal Polysaccharide-23 08/21/2012  . Tdap 06/28/2012, 06/28/2012  . Zoster 10/28/2008  . Zoster Recombinat (Shingrix) 01/24/2017, 04/26/2017   We updated and reviewed the patient's past history in detail and it is documented below. Allergies: Patient has No Known Allergies. Past Medical History Patient  has a past medical history of Chronic tension headaches (08/14/2018), Diabetes mellitus, Headache(784.0), HLP (hyperkeratosis lenticularis perstans), Hot flashes, Hyperlipidemia, Menopausal hot flushes (08/14/2018), Nephrolithiasis, Over weight, Thyroid disease, Tubular adenoma of colon (12/27/2019), and Urine incontinence. Past Surgical History Patient  has a past surgical history that includes Abdominal hysterectomy; left shoulder; lower back surgery; cornea implant; tubal ligation (1980); Vesicovaginal fistula closure w/ TAH (1995); Spine surgery (2005); Appendectomy; Cataract extraction (Right, 2017); Breast excisional biopsy (Left); Breast excisional biopsy (Left);  and Breast excisional biopsy (Left). Family History: Patient family history includes Breast cancer (age of onset: 64) in her sister; Cancer in her father; Pancreatic cancer in her mother; Tremor in her brother, father, mother, and sister. Social History:  Patient  reports that she has never smoked. She has never used smokeless tobacco. She reports that she does not drink alcohol or use drugs.  Review of Systems: Constitutional: negative for fever or malaise Ophthalmic: negative for photophobia, double vision or loss of vision Cardiovascular: negative for chest pain, dyspnea on exertion, or new LE swelling Respiratory: negative for SOB or persistent cough Gastrointestinal: negative for abdominal pain, change in bowel habits or  melena Genitourinary: negative for dysuria or gross hematuria, no abnormal uterine bleeding or disharge Musculoskeletal: negative for new gait disturbance or muscular weakness Integumentary: negative for new or persistent rashes, no breast lumps Neurological: negative for TIA or stroke symptoms Psychiatric: negative for SI or delusions Allergic/Immunologic: negative for hives  Patient Care Team    Relationship Specialty Notifications Start End  Leamon Arnt, MD PCP - General Family Medicine  08/14/18   Tat, Eustace Quail, DO Consulting Physician Neurology  08/14/18   Ronald Lobo, MD Consulting Physician Gastroenterology  08/14/18   Warren Danes, PA-C Physician Assistant Dermatology  09/15/18   Ortho, [facility]  Specialist  09/15/18   Marilynne Halsted, MD Referring Physician Ophthalmology  09/15/18   Tat, Eustace Quail, DO Consulting Physician Neurology  12/13/18   Gordy Levan, MD  Oncology  09/11/19   Sydnee Cabal, MD Consulting Physician Orthopedic Surgery  09/11/19     Objective  Vitals: BP 116/78 (BP Location: Left Arm, Patient Position: Sitting, Cuff Size: Normal)   Pulse 75   Temp (!) 97.3 F (36.3 C) (Temporal)   Ht 5' 3.5" (1.613 m)   Wt 179 lb 3.2 oz (81.3 kg)   SpO2 94%   BMI 31.25 kg/m  General:  Well developed, well nourished, no acute distress  Psych:  Alert and orientedx3,normal mood and affect HEENT:  Normocephalic, atraumatic, non-icteric sclera,  Left corneal implant present, supple neck without adenopathy, mass or thyromegaly Cardiovascular:  Normal S1, S2, RRR without gallop, rub or murmur Respiratory:  Good breath sounds bilaterally, CTAB with normal respiratory effort Gastrointestinal: normal bowel sounds, soft, non-tender, no noted masses. No HSM MSK: no contusions. Joints are without erythema or swelling.  Skin:  Warm, no rashes or suspicious lesions noted Neurologic:    Mental status is normal.  Gross motor and sensory exams are normal. Normal gait.  Minimal tremor Breast Exam: No mass, skin retraction or nipple discharge is appreciated in either breast. No axillary adenopathy. Fibrocystic changes are not noted  Lab Results  Component Value Date   HGBA1C 5.9 (A) 12/27/2019   HGBA1C 5.9 (A) 09/11/2019   HGBA1C 6.2 (A) 12/21/2018       Commons side effects, risks, benefits, and alternatives for medications and treatment plan prescribed today were discussed, and the patient expressed understanding of the given instructions. Patient is instructed to call or message via MyChart if he/she has any questions or concerns regarding our treatment plan. No barriers to understanding were identified. We discussed Red Flag symptoms and signs in detail. Patient expressed understanding regarding what to do in case of urgent or emergency type symptoms.   Medication list was reconciled, printed and provided to the patient in AVS. Patient instructions and summary information was reviewed with the patient as documented in the AVS. This note was prepared with  assistance of Systems analyst. Occasional wrong-word or sound-a-like substitutions may have occurred due to the inherent limitations of voice recognition software  This visit occurred during the SARS-CoV-2 public health emergency.  Safety protocols were in place, including screening questions prior to the visit, additional usage of staff PPE, and extensive cleaning of exam room while observing appropriate contact time as indicated for disinfecting solutions.

## 2020-01-31 NOTE — Progress Notes (Signed)
Assessment/Plan:    1.  Tremor  Her tremor has been difficult to treat, mostly because tremor has been very bothersome to her, but has not been very visible to the examiner.  She does have some L pinky and ring finger tremor today and she reports that this can get worse on other days  -continue primidone, 50 mg, 3 po bid  -increase propranolol, 10 mg, tid.  Watch BP and pulse closely.  Let me know if lightheaded or dizzy.  2.  F/u 1 year  Subjective:   Laura Farrell was seen today in follow up for tremor.  My previous records were reviewed prior to todays visit. Some days I am terrible and other days I am good.  Not sure what makes a good tremor day versus a bad tremor day.  Pt denies falls.  Pt denies lightheadedness, near syncope.  No hallucinations.  Mood has been good.  Current prescribed movement disorder medications: Propranolol, 10 mg twice per day Primidone, 150 mg twice per day (increased last visit).   ALLERGIES:  No Known Allergies  CURRENT MEDICATIONS:  Outpatient Encounter Medications as of 02/04/2020  Medication Sig  . aspirin 81 MG tablet Take 81 mg by mouth daily.  Marland Kitchen atorvastatin (LIPITOR) 80 MG tablet Take 1 tablet (80 mg total) by mouth daily.  . diclofenac sodium (VOLTAREN) 1 % GEL Apply 2 g topically 4 (four) times daily.  . fenofibrate 54 MG tablet TAKE ONE TABLET BY MOUTH DAILY WITH MEALS  . gabapentin (NEURONTIN) 600 MG tablet Take 600 mg by mouth at bedtime.   Marland Kitchen levothyroxine (SYNTHROID) 88 MCG tablet Take 1 tablet (88 mcg total) by mouth daily.  Marland Kitchen omeprazole (PRILOSEC) 20 MG capsule Take 1 capsule (20 mg total) by mouth daily.  . prednisoLONE acetate (PRED FORTE) 1 % ophthalmic suspension PLACE 1 DROP INTO LEFT EYE DAILY  . primidone (MYSOLINE) 50 MG tablet TAKE THREE TABLETS BY MOUTH EVERY MORNING AND 3 TABLETS BY MOUTH EVERY EVENING  . propranolol (INDERAL) 10 MG tablet TAKE ONE TABLET BY MOUTH THREE TIMES DAILY (Patient taking differently: 10 mg 2  (two) times daily. )  . traMADol (ULTRAM) 50 MG tablet Take 50 mg by mouth every 6 (six) hours as needed.  . [DISCONTINUED] diclofenac Sodium (VOLTAREN) 1 % GEL Apply 1 application topically as needed.   No facility-administered encounter medications on file as of 02/04/2020.     Objective:    PHYSICAL EXAMINATION:    VITALS:   Vitals:   02/04/20 1401  BP: 114/72  Pulse: 71  SpO2: 97%  Weight: 176 lb (79.8 kg)  Height: 5\' 2"  (1.575 m)    GEN:  The patient appears stated age and is in NAD. HEENT:  Normocephalic, atraumatic.  The mucous membranes are moist. The superficial temporal arteries are without ropiness or tenderness. CV:  RRR Lungs:  CTAB Neck/HEME:  There are no carotid bruits bilaterally.  Neurological examination:  Orientation: The patient is alert and oriented x3. Cranial nerves: There is good facial symmetry. The speech is fluent and clear. Soft palate rises symmetrically and there is no tongue deviation. Hearing is intact to conversational tone. Sensation: Sensation is intact to light touch throughout Motor: Strength is at least antigravity x4.  Movement examination: Tone: There is normal tone in the UE/LE Abnormal movements: she has no rest tremor.  Mild postural tremor in the L pinky and ring fingers.  Mild trouble with archimedes spirals on the L Coordination:  There  is no decremation with RAM's, Gait and Station: The patient has no difficulty arising out of a deep-seated chair without the use of the hands. The patient's stride length is good I have reviewed and interpreted the following labs independently   Chemistry      Component Value Date/Time   NA 141 12/27/2019 0910   NA 141 09/13/2016 0803   K 4.3 12/27/2019 0910   K 4.4 09/13/2016 0803   CL 107 12/27/2019 0910   CO2 25 12/27/2019 0910   CO2 23 09/13/2016 0803   BUN 16 12/27/2019 0910   BUN 15.8 09/13/2016 0803   CREATININE 0.74 12/27/2019 0910   CREATININE 0.9 09/13/2016 0803       Component Value Date/Time   CALCIUM 9.6 12/27/2019 0910   CALCIUM 9.8 09/13/2016 0803   ALKPHOS 68 12/27/2019 0910   ALKPHOS 73 09/13/2016 0803   AST 27 12/27/2019 0910   AST 20 09/13/2016 0803   ALT 25 12/27/2019 0910   ALT 19 09/13/2016 0803   BILITOT 0.3 12/27/2019 0910   BILITOT 0.25 09/13/2016 0803     Lab Results  Component Value Date   TSH 2.36 12/27/2019      Total time spent on today's visit was 30 minutes, including both face-to-face time and nonface-to-face time.  Time included that spent on review of records (prior notes available to me/labs/imaging if pertinent), discussing treatment and goals, answering patient's questions and coordinating care.  Cc:  Leamon Arnt, MD

## 2020-02-04 ENCOUNTER — Ambulatory Visit: Payer: PPO | Admitting: Neurology

## 2020-02-04 ENCOUNTER — Other Ambulatory Visit: Payer: Self-pay

## 2020-02-04 ENCOUNTER — Encounter: Payer: Self-pay | Admitting: Neurology

## 2020-02-04 VITALS — BP 114/72 | HR 71 | Ht 62.0 in | Wt 176.0 lb

## 2020-02-04 DIAGNOSIS — G250 Essential tremor: Secondary | ICD-10-CM | POA: Diagnosis not present

## 2020-02-04 MED ORDER — PROPRANOLOL HCL 10 MG PO TABS: 10.0000 mg | ORAL_TABLET | Freq: Three times a day (TID) | ORAL | 3 refills | Status: DC

## 2020-02-04 MED ORDER — PRIMIDONE 50 MG PO TABS: ORAL_TABLET | ORAL | 3 refills | Status: DC

## 2020-02-04 NOTE — Patient Instructions (Signed)
1.  continue primidone, 50 mg, 3 tabs twice per day 2.  increase propranolol, 10 mg, 1 tablet THREE times per day.  Watch BP and pulse closely.  Let me know if lightheaded or dizzy.

## 2020-02-19 ENCOUNTER — Other Ambulatory Visit: Payer: Self-pay | Admitting: Family Medicine

## 2020-02-26 ENCOUNTER — Ambulatory Visit (INDEPENDENT_AMBULATORY_CARE_PROVIDER_SITE_OTHER): Payer: PPO | Admitting: Family Medicine

## 2020-02-26 ENCOUNTER — Encounter: Payer: Self-pay | Admitting: Family Medicine

## 2020-02-26 ENCOUNTER — Other Ambulatory Visit: Payer: Self-pay

## 2020-02-26 VITALS — BP 124/78 | HR 58 | Temp 98.0°F | Resp 18 | Ht 62.0 in | Wt 176.4 lb

## 2020-02-26 DIAGNOSIS — M1712 Unilateral primary osteoarthritis, left knee: Secondary | ICD-10-CM

## 2020-02-26 DIAGNOSIS — M7061 Trochanteric bursitis, right hip: Secondary | ICD-10-CM | POA: Diagnosis not present

## 2020-02-26 MED ORDER — TRIAMCINOLONE ACETONIDE 40 MG/ML IJ SUSP
20.0000 mg | Freq: Once | INTRAMUSCULAR | Status: AC
  Administered 2020-02-26: 20 mg via INTRA_ARTICULAR

## 2020-02-26 MED ORDER — LIDOCAINE HCL (PF) 1 % IJ SOLN
4.0000 mg | Freq: Once | INTRAMUSCULAR | Status: AC
  Administered 2020-02-26: 4 mg

## 2020-02-26 MED ORDER — LIDOCAINE HCL (PF) 2 % IJ SOLN
1.5000 mL | Freq: Once | INTRAMUSCULAR | Status: AC
  Administered 2020-02-26: 1.5 mL

## 2020-02-26 MED ORDER — TRIAMCINOLONE ACETONIDE 40 MG/ML IJ SUSP
40.0000 mg | Freq: Once | INTRAMUSCULAR | Status: AC
  Administered 2020-02-26: 40 mg via INTRA_ARTICULAR

## 2020-02-26 NOTE — Progress Notes (Signed)
Subjective  CC:  Chief Complaint  Patient presents with  . Joint Pain    Left knee feels as if it slips or pops out of place, she has had surgery on this knee in the past. Bilateral hip pain more on the right than the left. She has only taken Advil for her hip pain.     HPI: Laura Farrell is a 74 y.o. female who presents to the office today to address the problems listed above in the chief complaint.  74 yo with DJD of left knee, reviewed xrays done in December 2020 c/o persistent knee pain, worse with certain mvts. Mild swelling. No new injury (fell in December). No calf pain. No give way but "pops". Also c/o R>L hip pain, has pain when lying on right side. Interfering with sleep. Has chronic low back pain that has improved due to djd. No groin pain or leg weakness. No radicular sxs. advil helps some.    Assessment  1. Primary osteoarthritis of left knee   2. Greater trochanteric bursitis of right hip      Plan   OA knee +/- meniscal:  ICE and s/p steroid injection. See AVS for instructions. Routine post procedure care instructions were given to patient in detail.  Hip bursitis: ice and education. S/p steroid injection. Stretching. Should do well.   Follow up: Return if symptoms worsen or fail to improve.  06/06/2020  No orders of the defined types were placed in this encounter.  No orders of the defined types were placed in this encounter.     I reviewed the patients updated PMH, FH, and SocHx.    Patient Active Problem List   Diagnosis Date Noted  . Prediabetes 12/27/2019    Priority: High  . Tubular adenoma of colon 12/27/2019    Priority: High  . Family history of breast cancer in sister 08/14/2018    Priority: High  . Hyperlipidemia 06/28/2012    Priority: High  . Hypothyroidism 06/28/2012    Priority: High  . OSA (obstructive sleep apnea) 05/24/2012    Priority: High  . Essential tremor 03/20/2012    Priority: High  . Osteoarthritis of left knee  04/10/2019    Priority: Medium  . Chronic tension headaches 08/14/2018    Priority: Medium  . Menopausal hot flushes 08/14/2018    Priority: Medium  . Bilateral hydronephrosis 09/10/2013    Priority: Medium  . GERD (gastroesophageal reflux disease) 06/28/2012    Priority: Medium  . History of cluster headache 06/28/2012    Priority: Medium  . Notalgia paresthetica 09/25/2018    Priority: Low  . HLP (hyperkeratosis lenticularis perstans)     Priority: Low  . At high risk for breast cancer 09/13/2016  . Status post corneal transplant 11/06/2009   Current Meds  Medication Sig  . aspirin 81 MG tablet Take 81 mg by mouth daily.  Marland Kitchen atorvastatin (LIPITOR) 80 MG tablet TAKE ONE TABLET BY MOUTH DAILY  . diclofenac sodium (VOLTAREN) 1 % GEL Apply 2 g topically 4 (four) times daily.  . fenofibrate 54 MG tablet TAKE ONE TABLET BY MOUTH DAILY WITH MEALS  . gabapentin (NEURONTIN) 600 MG tablet Take 600 mg by mouth at bedtime.   Marland Kitchen levothyroxine (SYNTHROID) 88 MCG tablet Take 1 tablet (88 mcg total) by mouth daily.  Marland Kitchen omeprazole (PRILOSEC) 20 MG capsule Take 1 capsule (20 mg total) by mouth daily.  . prednisoLONE acetate (PRED FORTE) 1 % ophthalmic suspension PLACE 1 DROP INTO LEFT  EYE DAILY  . primidone (MYSOLINE) 50 MG tablet TAKE THREE TABLETS BY MOUTH EVERY MORNING AND 3 TABLETS BY MOUTH EVERY EVENING  . propranolol (INDERAL) 10 MG tablet Take 1 tablet (10 mg total) by mouth in the morning, at noon, and at bedtime.  . traMADol (ULTRAM) 50 MG tablet Take 50 mg by mouth every 6 (six) hours as needed.    Allergies: Patient has No Known Allergies. Family History: Patient family history includes Breast cancer (age of onset: 34) in her sister; Cancer in her father; Pancreatic cancer in her mother; Tremor in her brother, father, mother, and sister. Social History:  Patient  reports that she has never smoked. She has never used smokeless tobacco. She reports that she does not drink alcohol or use  drugs.  Review of Systems: Constitutional: Negative for fever malaise or anorexia Cardiovascular: negative for chest pain Respiratory: negative for SOB or persistent cough Gastrointestinal: negative for abdominal pain  Objective  Vitals: BP 124/78   Pulse (!) 58   Temp 98 F (36.7 C) (Temporal)   Resp 18   Ht 5\' 2"  (1.575 m)   Wt 176 lb 6.4 oz (80 kg)   SpO2 97%   BMI 32.26 kg/m  General: no acute distress , A&Ox3 HEENT: PEERL, conjunctiva normal, neck is supple Cardiovascular:  RRR without murmur or gallop.  Respiratory:  Good breath sounds bilaterally, CTAB with normal respiratory effort Skin:  Warm, no rashes  Knee Arthrocentesis with Injection Procedure Note  Pre-operative Diagnosis: left knee djd  Post-operative Diagnosis: same  Indications: Symptom relief from osteoarthritis  Anesthesia: Lidocaine 1% without epinephrine without added sodium bicarbonate  Procedure Details   Verbal consent was obtained for the procedure. Universal time out taken.  The Knee joint was prepped with alcohol and an 18 gauge needle was inserted into the joint from the lateral approach. Four ml 1% lidocaine and one ml of triamcinolone (KENALOG) 40mg /ml was then injected into the joint through the same needle. The needle was removed and the area cleansed and dressed.  Complications:  None; patient tolerated the procedure well.  GR Trochanteric Bursa steroid injection  Procedure Note   Pre-operative Diagnosis: right hip bursitis   Post-operative Diagnosis: same   Indications: pain   Anesthesia: cold spray   Procedure Details    Verbal consent was obtained for the procedure. Universal time out done. The point of maximum tenderness was identified and marked over the hip bursa. The skin prepped with alcohol and cold spray used for anesthesia. A needle was advanced into the bursa and the steroid/lido (20mg  kenalog: 1.59ml lidocaine w/o epi) was administered easily.    Complications:   None; patient tolerated the procedure well.    Commons side effects, risks, benefits, and alternatives for medications and treatment plan prescribed today were discussed, and the patient expressed understanding of the given instructions. Patient is instructed to call or message via MyChart if he/she has any questions or concerns regarding our treatment plan. No barriers to understanding were identified. We discussed Red Flag symptoms and signs in detail. Patient expressed understanding regarding what to do in case of urgent or emergency type symptoms.   Medication list was reconciled, printed and provided to the patient in AVS. Patient instructions and summary information was reviewed with the patient as documented in the AVS. This note was prepared with assistance of Dragon voice recognition software. Occasional wrong-word or sound-a-like substitutions may have occurred due to the inherent limitations of voice recognition software  This visit  occurred during the SARS-CoV-2 public health emergency.  Safety protocols were in place, including screening questions prior to the visit, additional usage of staff PPE, and extensive cleaning of exam room while observing appropriate contact time as indicated for disinfecting solutions.

## 2020-02-26 NOTE — Addendum Note (Signed)
Addended by: Doran Clay A on: 02/26/2020 09:46 AM   Modules accepted: Orders

## 2020-02-26 NOTE — Patient Instructions (Signed)
Please follow up if symptoms do not improve or as needed.   Ice the knee and hip for about 10 minutes twice a day for the next several days. You may wear a knee support as needed.   You had a steroid injection today.   Things to be aware of after this injection are listed below:  You may experience no significant improvement or even a slight worsening in your symptoms during the first 24 to 48 hours.  After that we expect your symptoms to improve gradually over the next 2 weeks for the medicine to have its maximal effect.  You should continue to have improvement out to 6 weeks after your injection.  I recommend icing the site of the injection for 20 minutes  1-2 times the day of your injection  You may shower but no swimming, tub bath or Jacuzzi for 24 hours.  If your bandage falls off this does not need to be replaced.  It is appropriate to remove the bandage after 4 hours.  You may resume light activities as tolerated.     POSSIBLE PROCEDURE SIDE EFFECTS: The side effects of the injection are usually fairly minimal however if you may experience some of the following side effects that are usually self-limited and will is off on their own.  If you are concerned please feel free to call the office with questions:             Increased numbness or tingling             Nausea or vomiting             Swelling or bruising at the injection site    Please call our office if if you experience any of the following symptoms over the next week as these can be signs of infection:              Fever greater than 100.10F             Significant swelling at the injection site             Significant redness or drainage from the injection site     Hip Bursitis  Hip bursitis is inflammation of a fluid-filled sac (bursa) in the hip joint. The bursa prevents the bones in the hip joint from rubbing against each other. Hip bursitis can cause mild to moderate pain, and symptoms often come and go over  time. What are the causes? This condition may be caused by:  Injury to the hip.  Overuse of the muscles that surround the hip joint.  Previous injury or surgery of the hip.  Arthritis or gout.  Diabetes.  Thyroid disease.  Infection. In some cases, the cause may not be known. What are the signs or symptoms? Symptoms of this condition include:  Mild or moderate pain in the hip area. Pain may get worse with movement.  Tenderness and swelling of the hip, especially on the outer side of the hip.  In rare cases, the bursa may become infected. This may cause a fever, as well as warmth and redness in the area. Symptoms may come and go. How is this diagnosed? This condition may be diagnosed based on:  A physical exam.  Your medical history.  X-rays.  Removal of fluid from your inflamed bursa for testing (biopsy). You may be sent to a health care provider who specializes in bone diseases (orthopedist) or a provider who specializes in joint inflammation (rheumatologist). How  is this treated? This condition is treated by resting, icing, applying pressure (compression), and raising (elevating) the injured area. This is called RICE treatment. In some cases, this may be enough to make your symptoms go away. Treatment may also include:  Using crutches.  Draining fluid out of the bursa to help relieve swelling.  Injecting medicine that helps to reduce inflammation (cortisone).  Additional medicines if the bursa is infected. Follow these instructions at home: Managing pain, stiffness, and swelling   If directed, put ice on the painful area. ? Put ice in a plastic bag. ? Place a towel between your skin and the bag. ? Leave the ice on for 20 minutes, 2-3 times a day. ? Raise (elevate) your hip above the level of your heart as much as you can without pain. To do this, try putting a pillow under your hips while you lie down. Activity  Return to your normal activities as told by  your health care provider. Ask your health care provider what activities are safe for you.  Rest and protect your hip as much as possible until your pain and swelling get better. General instructions  Take over-the-counter and prescription medicines only as told by your health care provider.  Wear compression wraps only as told by your health care provider.  Do not use your hip to support your body weight until your health care provider says that you can. Use crutches as told by your health care provider.  Gently massage and stretch your injured area as often as is comfortable.  Keep all follow-up visits as told by your health care provider. This is important. How is this prevented?  Exercise regularly, as told by your health care provider.  Warm up and stretch before being active.  Cool down and stretch after being active.  If an activity irritates your hip or causes pain, avoid the activity as much as possible.  Avoid sitting down for long periods at a time. Contact a health care provider if you:  Have a fever.  Develop new symptoms.  Have difficulty walking or doing everyday activities.  Have pain that gets worse or does not get better with medicine.  Develop red skin or a feeling of warmth in your hip area. Get help right away if you:  Cannot move your hip.  Have severe pain. Summary  Hip bursitis is inflammation of a fluid-filled sac (bursa) in the hip joint.  Hip bursitis can cause mild to moderate pain, and symptoms often come and go over time.  This condition is treated with rest, ice, compression, elevation, and medicines. This information is not intended to replace advice given to you by your health care provider. Make sure you discuss any questions you have with your health care provider. Document Revised: 06/05/2018 Document Reviewed: 06/05/2018 Elsevier Patient Education  [place].

## 2020-02-28 ENCOUNTER — Encounter: Payer: Self-pay | Admitting: Physician Assistant

## 2020-02-28 ENCOUNTER — Ambulatory Visit: Payer: PPO | Admitting: Physician Assistant

## 2020-02-28 ENCOUNTER — Other Ambulatory Visit: Payer: Self-pay

## 2020-02-28 DIAGNOSIS — L578 Other skin changes due to chronic exposure to nonionizing radiation: Secondary | ICD-10-CM | POA: Diagnosis not present

## 2020-02-28 DIAGNOSIS — Z1283 Encounter for screening for malignant neoplasm of skin: Secondary | ICD-10-CM | POA: Diagnosis not present

## 2020-02-28 DIAGNOSIS — L821 Other seborrheic keratosis: Secondary | ICD-10-CM

## 2020-02-28 DIAGNOSIS — L719 Rosacea, unspecified: Secondary | ICD-10-CM

## 2020-02-28 DIAGNOSIS — D229 Melanocytic nevi, unspecified: Secondary | ICD-10-CM | POA: Diagnosis not present

## 2020-02-28 DIAGNOSIS — L820 Inflamed seborrheic keratosis: Secondary | ICD-10-CM | POA: Diagnosis not present

## 2020-02-28 DIAGNOSIS — D1800 Hemangioma unspecified site: Secondary | ICD-10-CM | POA: Diagnosis not present

## 2020-02-28 DIAGNOSIS — L814 Other melanin hyperpigmentation: Secondary | ICD-10-CM | POA: Diagnosis not present

## 2020-04-16 DIAGNOSIS — M4316 Spondylolisthesis, lumbar region: Secondary | ICD-10-CM | POA: Diagnosis not present

## 2020-04-16 DIAGNOSIS — M4126 Other idiopathic scoliosis, lumbar region: Secondary | ICD-10-CM | POA: Diagnosis not present

## 2020-04-16 DIAGNOSIS — M5416 Radiculopathy, lumbar region: Secondary | ICD-10-CM | POA: Diagnosis not present

## 2020-04-16 DIAGNOSIS — M5136 Other intervertebral disc degeneration, lumbar region: Secondary | ICD-10-CM | POA: Diagnosis not present

## 2020-05-09 ENCOUNTER — Ambulatory Visit: Payer: PPO | Admitting: Physician Assistant

## 2020-05-16 NOTE — Progress Notes (Signed)
   Follow-Up Visit   Subjective  Laura Farrell is a 74 y.o. female who presents for the following: Skin Problem (Check places on neck and face. skin tag like spots.).   The following portions of the chart were reviewed this encounter and updated as appropriate: Tobacco  Allergies  Meds  Problems  Med Hx  Surg Hx  Fam Hx      Objective  Well appearing patient in no apparent distress; mood and affect are within normal limits.  A full examination was performed including scalp, head, eyes, ears, nose, lips, neck, chest, axillae, abdomen, back, buttocks, bilateral upper extremities, bilateral lower extremities, hands, feet, fingers, toes, fingernails, and toenails. All findings within normal limits unless otherwise noted below.  Objective  Waist Up: No Dn or signs of NMSC  Objective  Neck - Anterior (12): Erythematous stuck-on, waxy papule or plaque.   Objective  Left Malar Cheek, Right Malar Cheek: Centrifacial erythema with or without papules/pustules.    Assessment & Plan  Screening exam for skin cancer Waist Up  Yearly skin exam  Inflamed seborrheic keratosis (12) Neck - Anterior  Destruction of lesion - Neck - Anterior Complexity: simple   Destruction method: cryotherapy   Informed consent: discussed and consent obtained   Timeout:  patient name, date of birth, surgical site, and procedure verified Lesion destroyed using liquid nitrogen: Yes   Cryotherapy cycles:  1 Outcome: patient tolerated procedure well with no complications   Post-procedure details: wound care instructions given    Rosacea (2) Left Malar Cheek; Right Malar Cheek  observe   Lentigines - Scattered tan macules - Discussed due to sun exposure - Benign, observe - Call for any changes  Seborrheic Keratoses - Stuck-on, waxy, tan-brown papules and plaques  - Discussed benign etiology and prognosis. - Observe - Call for any changes  Melanocytic Nevi - Tan-brown and/or  pink-flesh-colored symmetric macules and papules - Benign appearing on exam today - Observation - Call clinic for new or changing moles - Recommend daily use of broad spectrum spf 30+ sunscreen to sun-exposed areas.   Hemangiomas - Red papules - Discussed benign nature - Observe - Call for any changes  Actinic Damage - diffuse scaly erythematous macules with underlying dyspigmentation - Recommend daily broad spectrum sunscreen SPF 30+ to sun-exposed areas, reapply every 2 hours as needed.  - Call for new or changing lesions.  Skin cancer screening performed today.  I, Darionna Banke, PA-C, have reviewed all documentation's for this visit.  The documentation on 05/16/20 for the exam, diagnosis, procedures and orders are all accurate and complete.

## 2020-05-16 NOTE — Progress Notes (Deleted)
I, Minerva Bluett, PA-C, have reviewed all documentation for this visit. The documentation on 05/16/20 for the exam, diagnosis, procedures, and orders are all accurate and complete.

## 2020-05-17 ENCOUNTER — Other Ambulatory Visit: Payer: Self-pay | Admitting: Family Medicine

## 2020-06-03 DIAGNOSIS — M47816 Spondylosis without myelopathy or radiculopathy, lumbar region: Secondary | ICD-10-CM | POA: Diagnosis not present

## 2020-06-06 ENCOUNTER — Ambulatory Visit: Payer: PPO | Admitting: Family Medicine

## 2020-06-10 ENCOUNTER — Other Ambulatory Visit: Payer: Self-pay

## 2020-06-10 ENCOUNTER — Telehealth: Payer: Self-pay | Admitting: Family Medicine

## 2020-06-10 ENCOUNTER — Other Ambulatory Visit: Payer: Self-pay | Admitting: Family Medicine

## 2020-06-10 ENCOUNTER — Ambulatory Visit (INDEPENDENT_AMBULATORY_CARE_PROVIDER_SITE_OTHER): Payer: PPO | Admitting: Family Medicine

## 2020-06-10 ENCOUNTER — Encounter: Payer: Self-pay | Admitting: Family Medicine

## 2020-06-10 VITALS — BP 122/62 | HR 63 | Temp 97.3°F | Resp 18 | Ht 64.0 in | Wt 170.2 lb

## 2020-06-10 DIAGNOSIS — Z780 Asymptomatic menopausal state: Secondary | ICD-10-CM

## 2020-06-10 DIAGNOSIS — E782 Mixed hyperlipidemia: Secondary | ICD-10-CM | POA: Diagnosis not present

## 2020-06-10 DIAGNOSIS — L719 Rosacea, unspecified: Secondary | ICD-10-CM

## 2020-06-10 DIAGNOSIS — R7303 Prediabetes: Secondary | ICD-10-CM | POA: Diagnosis not present

## 2020-06-10 DIAGNOSIS — E6609 Other obesity due to excess calories: Secondary | ICD-10-CM | POA: Diagnosis not present

## 2020-06-10 DIAGNOSIS — D126 Benign neoplasm of colon, unspecified: Secondary | ICD-10-CM

## 2020-06-10 DIAGNOSIS — N951 Menopausal and female climacteric states: Secondary | ICD-10-CM | POA: Diagnosis not present

## 2020-06-10 LAB — MICROALBUMIN / CREATININE URINE RATIO
Creatinine, Urine: 245 mg/dL (ref 20–275)
Microalb Creat Ratio: 4 mcg/mg creat (ref <30)
Microalb, Ur: 1 mg/dL

## 2020-06-10 LAB — POCT GLYCOSYLATED HEMOGLOBIN (HGB A1C): Hemoglobin A1C: 5.5 % (ref 4.0–5.6)

## 2020-06-10 MED ORDER — METRONIDAZOLE 0.75 % EX CREA: TOPICAL_CREAM | Freq: Two times a day (BID) | CUTANEOUS | 2 refills | Status: DC

## 2020-06-10 MED ORDER — PHENTERMINE HCL 37.5 MG PO TABS: 18.7500 mg | ORAL_TABLET | Freq: Every day | ORAL | 2 refills | Status: DC | PRN

## 2020-06-10 MED ORDER — GABAPENTIN 600 MG PO TABS: 600.0000 mg | ORAL_TABLET | Freq: Every day | ORAL | 3 refills | Status: DC

## 2020-06-10 NOTE — Patient Instructions (Addendum)
Please return in 6 months 12 months for your annual complete physical; please come fasting. Your sugars are doing even better!  Call the dermatologist to see if he has samples. The Rhofade is not covered on your insurance and is likely very expensive.   Please contact Dr. Osborn Coho office to get set up for your colonoscopy. You are due for a repeat since you had a precancerous polyp on your last colonoscopy. We typically consider stopping these at age 74.  If you have any questions or concerns, please don't hesitate to send me a message via MyChart or call the office at [phone]. Thank you for visiting with Laura Farrell today! It's our pleasure caring for you.

## 2020-06-10 NOTE — Telephone Encounter (Signed)
Patient was told via mychart message that a bone density is done every 2 years. Her last bone density was done in 2019.

## 2020-06-10 NOTE — Progress Notes (Signed)
Subjective  CC:  Chief Complaint  Patient presents with  . Prediabetes check    Fasting labs.   . Health Maintenance    Wasnt aware that she needed to have a colonoscopy done. She would like to know what is the cut off age for having a colonoscopy.     HPI: Laura Farrell is a 74 y.o. female who presents to the office today for follow up of diabetes and problems listed above in the chief complaint.   preDiabetes follow up: Her diabetic control is reported as Improved.  She continues to follow a diabetic diet although she is unhappy with her weight.  She is actually lost a few pounds. She denies exertional CP or SOB or symptomatic hypoglycemia. She denies foot sores or paresthesias.   Hyperlipidemia on fenofibrate and high-dose statin.  No adverse effects.Marland Kitchen  LDL has been at goal.  Menopausal hot flashes: Ran out of gabapentin and is suffering from hot flashes again.  Would like to restart.  Was not having adverse effects.  Obesity: Would like to lose weight.  Several years ago she was treated with phentermine and would like to restart this.  She has no hypertension.  No heart disease.  She says her appetite is large and she likes to eat all of the time.  Rosacea: Currently active.  Has been treated with oxymetazoline cream by dermatologist in the past.  Was given samples.  Tubular adenoma on last colonoscopy in 2016 by Dr. Cristina Gong.  Due for another colonoscopy now.  No symptoms or melena.  Wt Readings from Last 3 Encounters:  06/10/20 170 lb 3.2 oz (77.2 kg)  02/26/20 176 lb 6.4 oz (80 kg)  02/04/20 176 lb (79.8 kg)    BP Readings from Last 3 Encounters:  06/10/20 122/62  02/26/20 124/78  02/04/20 114/72    Assessment  1. Prediabetes   2. Tubular adenoma of colon   3. Menopausal hot flushes   4. Asymptomatic menopausal state   5. Mixed hyperlipidemia   6. Exogenous obesity   7. Rosacea      Plan   Pre-diabetes is currently very well controlled.  Normal A1c today.   We will recheck again at next visit.  She may have resolved this.  Continue diabetic diet and weight loss.  Check urine test today.  Adenoma, colon: Recommend repeat colonoscopy.  Patient to call Dr. Osborn Coho office  Menopausal hot flashes were controlled on gabapentin.  Restart.  Refilled.  Continue Effexor  Mixed hyperlipidemia on fenofibrate and statin, high-dose.  Well-controlled.  Check levels again in 6 months with LFTs.  Overweight/obesity: Education given.  Will utilize phentermine for 1 to 3 months just to help decrease appetite and help her make better food choices and limit portion size.  This will not be a long-term option.  Discussed risks versus benefits.  Patient understands and agrees with care plan  Rosacea trial of metronidazole cream.  She can follow-up with dermatology for samples of the other medication if needed.  Health maintenance: Will be due for bone density in December and mammogram in March.  Last bone density was normal.  Follow up: 6 months for complete physical and follow-up prediabetes. Orders Placed This Encounter  Procedures  . DG Bone Density  . Microalbumin / creatinine urine ratio  . POCT HgB A1C   Meds ordered this encounter  Medications  . gabapentin (NEURONTIN) 600 MG tablet    Sig: Take 1 tablet (600 mg total) by mouth at  bedtime.    Dispense:  90 tablet    Refill:  3  . phentermine (ADIPEX-P) 37.5 MG tablet    Sig: Take 0.5-1 tablets (18.75-37.5 mg total) by mouth daily as needed.    Dispense:  30 tablet    Refill:  2  . metroNIDAZOLE (METROCREAM) 0.75 % cream    Sig: Apply topically 2 (two) times daily.    Dispense:  45 g    Refill:  2      Immunization History  Administered Date(s) Administered  . Fluad Quad(high Dose 65+) 07/18/2019  . Influenza Split 09/11/2011, 06/28/2012, 08/01/2014  . Influenza, High Dose Seasonal PF 08/02/2013, 08/28/2015, 05/31/2017, 07/19/2018  . Influenza, Quadrivalent, Recombinant, Inj, Pf 08/05/2017    . Influenza,inj,quad, With Preservative 08/05/2017  . Influenza-Unspecified 08/11/2016, 08/05/2017  . PFIZER SARS-COV-2 Vaccination 11/05/2019, 11/26/2019  . Pneumococcal Conjugate-13 10/29/2003, 12/24/2016  . Pneumococcal Polysaccharide-23 08/21/2012  . Tdap 06/28/2012, 06/28/2012  . Zoster 10/28/2008  . Zoster Recombinat (Shingrix) 01/24/2017, 04/26/2017    Diabetes Related Lab Review: Lab Results  Component Value Date   HGBA1C 5.5 06/10/2020   HGBA1C 5.9 (A) 12/27/2019   HGBA1C 5.9 (A) 09/11/2019    Lab Results  Component Value Date   MICROALBUR 1.2 09/11/2019   Lab Results  Component Value Date   CREATININE 0.74 12/27/2019   BUN 16 12/27/2019   NA 141 12/27/2019   K 4.3 12/27/2019   CL 107 12/27/2019   CO2 25 12/27/2019   Lab Results  Component Value Date   CHOL 170 12/27/2019   CHOL 184 09/25/2018   CHOL 151 07/19/2018   Lab Results  Component Value Date   HDL 44.50 12/27/2019   HDL 53.40 09/25/2018   HDL 42.20 07/19/2018   Lab Results  Component Value Date   LDLCALC 97 12/27/2019   LDLCALC 100 (H) 09/25/2018   LDLCALC 85 07/19/2018   Lab Results  Component Value Date   TRIG 144.0 12/27/2019   TRIG 153.0 (H) 09/25/2018   TRIG 117.0 07/19/2018   Lab Results  Component Value Date   CHOLHDL 4 12/27/2019   CHOLHDL 3 09/25/2018   CHOLHDL 4 07/19/2018   Lab Results  Component Value Date   LDLDIRECT 132.4 07/17/2013   LDLDIRECT 93.8 06/28/2012   The 10-year ASCVD risk score Mikey Bussing DC Jr., et al., 2013) is: 36.6%   Values used to calculate the score:     Age: 52 years     Sex: Female     Is Non-Hispanic African American: No     Diabetic: Yes     Tobacco smoker: No     Systolic Blood Pressure: 725 mmHg     Is BP treated: Yes     HDL Cholesterol: 44.5 mg/dL     Total Cholesterol: 170 mg/dL I have reviewed the PMH, Fam and Soc history. Patient Active Problem List   Diagnosis Date Noted  . Prediabetes 12/27/2019    Priority: High  . Tubular  adenoma of colon 12/27/2019    Priority: High    Colonoscopy, Dr. Cristina Gong, 02/2015: tubular adenoma, 5 year recall   . Family history of breast cancer in sister 08/14/2018    Priority: High  . Hyperlipidemia 06/28/2012    Priority: High  . Hypothyroidism 06/28/2012    Priority: High  . OSA (obstructive sleep apnea) 05/24/2012    Priority: High    HST 2013:  AHI 12/hr   . Essential tremor 03/20/2012    Priority: High    Sees Dr.  Tat, neurology   . Osteoarthritis of left knee 04/10/2019    Priority: Medium  . Chronic tension headaches 08/14/2018    Priority: Medium  . Menopausal hot flushes 08/14/2018    Priority: Medium    On effexor and gabapentin   . Bilateral hydronephrosis 09/10/2013    Priority: Medium    Mild per MRI LS spine 02-2013   . GERD (gastroesophageal reflux disease) 06/28/2012    Priority: Medium  . History of cluster headache 06/28/2012    Priority: Medium  . Notalgia paresthetica 09/25/2018    Priority: Low  . HLP (hyperkeratosis lenticularis perstans)     Priority: Low  . Rosacea 06/10/2020  . At high risk for breast cancer 09/13/2016  . Status post corneal transplant 11/06/2009    Formatting of this note might be different from the original. [facility] Dr. Susa Simmonds     Social History: Patient  reports that she has never smoked. She has never used smokeless tobacco. She reports that she does not drink alcohol and does not use drugs.  Review of Systems: Ophthalmic: negative for eye pain, loss of vision or double vision Cardiovascular: negative for chest pain Respiratory: negative for SOB or persistent cough Gastrointestinal: negative for abdominal pain Genitourinary: negative for dysuria or gross hematuria MSK: negative for foot lesions Neurologic: negative for weakness or gait disturbance  Objective  Vitals: BP 122/62   Pulse 63   Temp (!) 97.3 F (36.3 C) (Temporal)   Resp 18   Ht 5\' 4"  (1.626 m)   Wt 170 lb 3.2 oz (77.2 kg)   SpO2 95%    BMI 29.21 kg/m  General: well appearing, no acute distress  HEENT:  Normocephalic, atraumatic, moist mucous membranes, supple neck  Cardiovascular:  Nl S1 and S2, RRR without murmur, gallop or rub. no edema Respiratory:  Good breath sounds bilaterally, CTAB with normal effort, no rales Neurologic:   Mental status is normal. normal gait Foot exam: no erythema, pallor, or cyanosis visible nl proprioception and sensation to monofilament testing bilaterally, +2 distal pulses bilaterally    Diabetic education: ongoing education regarding chronic disease management for diabetes was given today. We continue to reinforce the ABC's of diabetic management: A1c (<7 or 8 dependent upon patient), tight blood pressure control, and cholesterol management with goal LDL < 100 minimally. We discuss diet strategies, exercise recommendations, medication options and possible side effects. At each visit, we review recommended immunizations and preventive care recommendations for diabetics and stress that good diabetic control can prevent other problems. See below for this patient's data.    Commons side effects, risks, benefits, and alternatives for medications and treatment plan prescribed today were discussed, and the patient expressed understanding of the given instructions. Patient is instructed to call or message via MyChart if he/she has any questions or concerns regarding our treatment plan. No barriers to understanding were identified. We discussed Red Flag symptoms and signs in detail. Patient expressed understanding regarding what to do in case of urgent or emergency type symptoms.   Medication list was reconciled, printed and provided to the patient in AVS. Patient instructions and summary information was reviewed with the patient as documented in the AVS. This note was prepared with assistance of Dragon voice recognition software. Occasional wrong-word or sound-a-like substitutions may have occurred due to  the inherent limitations of voice recognition software  This visit occurred during the SARS-CoV-2 public health emergency.  Safety protocols were in place, including screening questions prior to the visit, additional usage  of staff PPE, and extensive cleaning of exam room while observing appropriate contact time as indicated for disinfecting solutions.

## 2020-06-10 NOTE — Telephone Encounter (Signed)
Patient is requesting a call back regarding bone density exam on her AVS

## 2020-06-11 DIAGNOSIS — T85398D Other mechanical complication of other ocular prosthetic devices, implants and grafts, subsequent encounter: Secondary | ICD-10-CM | POA: Diagnosis not present

## 2020-06-11 DIAGNOSIS — Z961 Presence of intraocular lens: Secondary | ICD-10-CM | POA: Diagnosis not present

## 2020-06-11 DIAGNOSIS — Z947 Corneal transplant status: Secondary | ICD-10-CM | POA: Diagnosis not present

## 2020-06-12 ENCOUNTER — Other Ambulatory Visit: Payer: Self-pay | Admitting: Family Medicine

## 2020-06-23 ENCOUNTER — Other Ambulatory Visit: Payer: Self-pay

## 2020-06-23 ENCOUNTER — Ambulatory Visit (INDEPENDENT_AMBULATORY_CARE_PROVIDER_SITE_OTHER): Payer: PPO | Admitting: Family Medicine

## 2020-06-23 ENCOUNTER — Encounter: Payer: Self-pay | Admitting: Family Medicine

## 2020-06-23 VITALS — BP 120/68 | HR 56 | Temp 98.3°F

## 2020-06-23 DIAGNOSIS — L570 Actinic keratosis: Secondary | ICD-10-CM

## 2020-06-23 DIAGNOSIS — Z23 Encounter for immunization: Secondary | ICD-10-CM | POA: Diagnosis not present

## 2020-06-23 DIAGNOSIS — L918 Other hypertrophic disorders of the skin: Secondary | ICD-10-CM

## 2020-06-23 DIAGNOSIS — H6122 Impacted cerumen, left ear: Secondary | ICD-10-CM

## 2020-06-23 NOTE — Patient Instructions (Addendum)
Please follow up as scheduled for your next visit with me: 12/10/2020   If you have any questions or concerns, please don't hesitate to send me a message via MyChart or call the office at [phone]. Thank you for visiting with Laura Farrell today! It's our pleasure caring for you.  Cryosurgery for Skin Conditions Cryosurgery is the use of a very cold liquid (liquid nitrogen) to treat abnormal or diseased tissue. This treatment is also called cryotherapy. It can freeze or take away growths on skin, such as:  Warts.  Skin sores that could become cancer.  Some skin cancers. This treatment normally takes a few minutes. It can be done in your doctor's office. Tell a doctor about:  Any allergies you have.  All medicines you are taking, including vitamins, herbs, eye drops, creams, and over-the-counter medicines.  Any problems you or family members have had with medicines that make you fall asleep (anesthetic medicines).  Any blood disorders you have.  Any surgeries you have had.  Any medical conditions you have.  Whether you are pregnant or may be pregnant. What are the risks? Generally, this is a safe treatment. However, problems may occur, including:  Infection.  Bleeding.  Scarring.  Changes in skin color (lighter or darker than normal skin tone).  Swelling.  Hair loss in the treated area.  Damage to nearby parts or organs, such as nerve damage and loss of feeling. This is rare. What happens before the procedure? You do not have to do anything to get ready for this treatment. Your doctor will talk with you about:  The treatment.  The benefits and risks. What happens during the procedure?   Your treatment will be done in one of these two ways: ? Your doctor may use a tool (probe) on your skin. The tool has very cold liquid in it to cool it down. The tool will be used until the skin is frozen and killed. ? Your doctor may use a swab or spray to get the very cold liquid onto  your skin. Your doctor will keep using the very cold liquid until the skin is frozen and killed.  A bandage (dressing) may be put on the area. These procedures may vary among doctors and clinics. What can I expect after the treatment?  After your treatment, it is common to have: ? Redness over the treated area. ? Swelling over the treated area. ? A blister that forms over the treated area. The blister may have a little blood in it.  You may also have some mild stinging or a burning feeling that will go away.  If a blister forms, it will break open on its own after about 2-4 weeks. This will leave a scab. Then the treated area will heal. After healing, there is normally little or no scarring. Follow these instructions at home: Caring for the treated area   Follow instructions from your doctor about how to take care of your treated area. If you have a bandage, make sure you: ? Wash your hands with soap and water for at least 20 seconds before and after you change your bandage. If you cannot use soap and water, use hand sanitizer. ? Change your bandage as told by your doctor. ? Keep the bandage and the treated area clean and dry. If the bandage gets wet, change it right away. ? Clean the treated area with soap and water. ? Keep the area covered with a bandage until it heals, or for as long  as told by your doctor.  Check the treated area every day for signs of infection. Check for: ? More redness, swelling, or pain. ? More fluid or blood. ? Warmth. ? Pus or a bad smell.  If you have a blister: ? Do not pick at it. Doing this can cause infection and scarring. ? Do not try to break it open. Doing this can cause infection and scarring.  Do not put any medicine, cream, or lotion on the treated area unless told by your doctor. Bathing  Until your doctor approves: ? Do not take baths. ? Do not swim. ? Do not use a hot tub. ? Do not hand-wash dishes. ? Do not soak the treated area in  other ways.  Ask your doctor if you may take showers. You may only be allowed to take sponge baths. General instructions  Take over-the-counter and prescription medicines only as told by your doctor.  Do not use any products that contain nicotine or tobacco, such as cigarettes, e-cigarettes, and chewing tobacco. These can delay healing. If you need help quitting, ask your doctor.  Keep all follow-up visits as told by your doctor. This is important. Contact a doctor if:  You have any of these signs of infection in or around your treated area: ? More redness. ? More swelling. ? More pain. ? More fluid. ? More blood. ? Warmth. ? Pus or a bad smell.  Your blister grows large and causes pain. Get help right away if:  You have a fever.  You have redness that spreads from the treated area. Summary  Cryosurgery uses a very cold liquid to freeze or take away growths on skin. It is also called cryotherapy.  Generally, this is a safe treatment. You do not have to do anything to get ready for it.  Your doctor will freeze or kill the skin in one of two ways. One way is with a tool (probe) that has the very cold liquid in it. The other way is to spread the very cold liquid onto your skin with a swab or spray.  After your treatment, follow care instructions from your doctor. Watch for infection. If you have a blister, do not pick at it or break it open. This information is not intended to replace advice given to you by your health care provider. Make sure you discuss any questions you have with your health care provider. Document Revised: 05/16/2019 Document Reviewed: 05/16/2019 Elsevier Patient Education  [employer].

## 2020-06-23 NOTE — Progress Notes (Signed)
Subjective  CC:  Chief Complaint  Patient presents with  . skin tag removal    HPI: Laura Farrell is a 74 y.o. female who presents to the office today to address the problems listed above in the chief complaint.  Pt complains of skin tags. The patient wishes skin tag removed as the lesions are getting caught on clothing and/or jewelry and are recurrently irritated. Multiple around neck and upper chest  Has red flaking dry spots that wants checked on upper chest and left arm and left leg. Have been there for months and months. Wont' go away.  Also c/o cerumen impaction; went to audiology and was given wax softening drops and irrigated but still feels clogged on left.   Assessment  1. Actinic keratoses   2. Impacted cerumen, left ear   3. Skin tags, multiple acquired   4. Need for immunization against influenza      Plan   AK's:  Premalignant, cryotherapy as documented below. Routine post procedure care instructions given.  Irritated skin tags: removed w/o complications. Pt tolerated procedures well  Left cerumen impaction easily cleared with irrigation.  Flu shot today  Follow up: Return for as scheduled, complete physical.  12/10/2020  Orders Placed This Encounter  Procedures  . Flu Vaccine QUAD High Dose(Fluad)   No orders of the defined types were placed in this encounter.     I reviewed the patients updated PMH, FH, and SocHx.    Patient Active Problem List   Diagnosis Date Noted  . Prediabetes 12/27/2019    Priority: High  . Tubular adenoma of colon 12/27/2019    Priority: High  . Family history of breast cancer in sister 08/14/2018    Priority: High  . Hyperlipidemia 06/28/2012    Priority: High  . Hypothyroidism 06/28/2012    Priority: High  . OSA (obstructive sleep apnea) 05/24/2012    Priority: High  . Essential tremor 03/20/2012    Priority: High  . Osteoarthritis of left knee 04/10/2019    Priority: Medium  . Chronic tension headaches  08/14/2018    Priority: Medium  . Menopausal hot flushes 08/14/2018    Priority: Medium  . Bilateral hydronephrosis 09/10/2013    Priority: Medium  . GERD (gastroesophageal reflux disease) 06/28/2012    Priority: Medium  . History of cluster headache 06/28/2012    Priority: Medium  . Notalgia paresthetica 09/25/2018    Priority: Low  . HLP (hyperkeratosis lenticularis perstans)     Priority: Low  . Rosacea 06/10/2020  . At high risk for breast cancer 09/13/2016  . Status post corneal transplant 11/06/2009   Current Meds  Medication Sig  . aspirin 81 MG tablet Take 81 mg by mouth daily.  Marland Kitchen atorvastatin (LIPITOR) 80 MG tablet TAKE ONE TABLET BY MOUTH DAILY  . diclofenac Sodium (VOLTAREN) 1 % GEL Apply 2 g topically 4 (four) times daily.  . fenofibrate 54 MG tablet TAKE ONE TABLET BY MOUTH DAILY WITH MEALS  . gabapentin (NEURONTIN) 600 MG tablet Take 1 tablet (600 mg total) by mouth at bedtime.  Marland Kitchen levothyroxine (SYNTHROID) 88 MCG tablet TAKE ONE TABLET BY MOUTH DAILY  . methocarbamol (ROBAXIN) 500 MG tablet Take 500 mg by mouth as needed.  . metroNIDAZOLE (METROCREAM) 0.75 % cream Apply topically 2 (two) times daily.  Marland Kitchen omeprazole (PRILOSEC) 20 MG capsule TAKE ONE CAPSULE BY MOUTH EVERY DAY  . phentermine (ADIPEX-P) 37.5 MG tablet Take 0.5-1 tablets (18.75-37.5 mg total) by mouth daily as needed.  Marland Kitchen  prednisoLONE acetate (PRED FORTE) 1 % ophthalmic suspension PLACE 1 DROP INTO LEFT EYE DAILY  . primidone (MYSOLINE) 50 MG tablet TAKE THREE TABLETS BY MOUTH EVERY MORNING AND 3 TABLETS BY MOUTH EVERY EVENING  . propranolol (INDERAL) 10 MG tablet Take 1 tablet (10 mg total) by mouth in the morning, at noon, and at bedtime.    Allergies: Patient has No Known Allergies. Family History: Patient family history includes Breast cancer (age of onset: 14) in her sister; Cancer in her father; Pancreatic cancer in her mother; Tremor in her brother, father, mother, and sister. Social History:    Patient  reports that she has never smoked. She has never used smokeless tobacco. She reports that she does not drink alcohol and does not use drugs.  Review of Systems: Constitutional: Negative for fever malaise or anorexia Cardiovascular: negative for chest pain Respiratory: negative for SOB or persistent cough Gastrointestinal: negative for abdominal pain  Objective  Vitals: BP 120/68   Pulse (!) 56   Temp 98.3 F (36.8 C) (Temporal)   SpO2 92%  General: no acute distress , A&Ox3 Left and right neck: 2 large irritated skin tags, multilple smaller skin tags on neck and chest Mid chest: 2 2-7mm red AK's Several AKs on left arm and one on left leg  Left TM obstructed by cerumen impaction  Skin Tag Removal Procedure Note Diagnosis: inflamed and painful skin tags Location: see descriptions from above Informed Consent: Discussed risks (permanent scarring, infection, pain, bleeding, bruising, redness, and recurrence of the lesion) and benefits of the procedure, as well as the alternatives. She is aware that skin tags are benign lesions, and their removal is often not considered medically necessary. Informed consent was obtained. Preparation: The area was prepared in a standard fashion. Anesthesia: Lidocaine 1% with epinephrine  Was used on large skin tag on right neck only Procedure Details: Iris scissors were used to perform sharp removal. Aluminum chloride was applied for hemostasis. Ointment and bandage were applied where needed. The patient tolerated the procedure well. Total number of lesions treated: 6 Plan: The patient was instructed on post-op care. Recommend OTC analgesia as needed for pain.  Cryotherapy Procedure Note  Pre-operative Diagnosis: Actinic keratosis  Post-operative Diagnosis: Actinic keratosis  Locations: chest and left arm and left leg, total # treated: 5  Indications: premalignant  Anesthesia: none  Procedure Details   Patient informed of risks  (permanent scarring, infection, light or dark discoloration, bleeding, infection, weakness, numbness and recurrence of the lesion) and benefits of the procedure and verbal informed consent obtained. Universal time out performed  The areas are treated with liquid nitrogen therapy, frozen until ice ball extended 2 mm beyond lesion, allowed to thaw, and treated again. The patient tolerated procedure well.  The patient was instructed on post-op care, warned that there may be blister formation, redness and pain. Recommend OTC analgesia as needed for pain.  Condition: Stable  Complications: none.     Commons side effects, risks, benefits, and alternatives for medications and treatment plan prescribed today were discussed, and the patient expressed understanding of the given instructions. Patient is instructed to call or message via MyChart if he/she has any questions or concerns regarding our treatment plan. No barriers to understanding were identified. We discussed Red Flag symptoms and signs in detail. Patient expressed understanding regarding what to do in case of urgent or emergency type symptoms.   Medication list was reconciled, printed and provided to the patient in AVS. Patient instructions and summary  information was reviewed with the patient as documented in the AVS. This note was prepared with assistance of Dragon voice recognition software. Occasional wrong-word or sound-a-like substitutions may have occurred due to the inherent limitations of voice recognition software  This visit occurred during the SARS-CoV-2 public health emergency.  Safety protocols were in place, including screening questions prior to the visit, additional usage of staff PPE, and extensive cleaning of exam room while observing appropriate contact time as indicated for disinfecting solutions.

## 2020-06-25 DIAGNOSIS — H43819 Vitreous degeneration, unspecified eye: Secondary | ICD-10-CM | POA: Diagnosis not present

## 2020-06-25 DIAGNOSIS — Z947 Corneal transplant status: Secondary | ICD-10-CM | POA: Diagnosis not present

## 2020-06-25 DIAGNOSIS — Z9841 Cataract extraction status, right eye: Secondary | ICD-10-CM | POA: Diagnosis not present

## 2020-06-25 DIAGNOSIS — Z961 Presence of intraocular lens: Secondary | ICD-10-CM | POA: Diagnosis not present

## 2020-06-27 ENCOUNTER — Ambulatory Visit: Payer: PPO | Admitting: Family Medicine

## 2020-07-16 ENCOUNTER — Telehealth: Payer: Self-pay | Admitting: Family Medicine

## 2020-07-16 NOTE — Progress Notes (Signed)
  Chronic Care Management   Outreach Note  07/16/2020 Name: Laura Farrell MRN: [id number] DOB: 1946/01/23  Referred by: Leamon Arnt, MD Reason for referral : No chief complaint on file.   An unsuccessful telephone outreach was attempted today. The patient was referred to the pharmacist for assistance with care management and care coordination.   Follow Up Plan:   [name] Upstream Scheduler

## 2020-07-17 DIAGNOSIS — M1712 Unilateral primary osteoarthritis, left knee: Secondary | ICD-10-CM | POA: Diagnosis not present

## 2020-07-23 ENCOUNTER — Telehealth: Payer: Self-pay | Admitting: Family Medicine

## 2020-07-23 NOTE — Chronic Care Management (AMB) (Signed)
°  Chronic Care Management   Outreach Note  07/23/2020 Name: Laura Farrell MRN: [id number] DOB: 04/01/1946  Referred by: Leamon Arnt, MD Reason for referral : No chief complaint on file.   A second unsuccessful telephone outreach was attempted today. The patient was referred to pharmacist for assistance with care management and care coordination.  Follow Up Plan:   [name] Upstream Scheduler

## 2020-08-05 ENCOUNTER — Other Ambulatory Visit: Payer: Self-pay | Admitting: Family Medicine

## 2020-08-05 NOTE — Telephone Encounter (Signed)
Last refill: 06/10/20 #30, 2 Last OV: 06/23/20 dx. Skin tag removal

## 2020-08-07 ENCOUNTER — Telehealth: Payer: Self-pay | Admitting: Family Medicine

## 2020-08-07 NOTE — Chronic Care Management (AMB) (Signed)
  Chronic Care Management   Note  08/07/2020 Name: JEANAE WHITMILL MRN: [id number] DOB: October 26, 1945  SARAANN ENNEKING is a 74 y.o. year old female who is a primary care patient of Leamon Arnt, MD. I reached out to Valli Glance by phone today in response to a referral sent by Ms. Lowell Guitar Stamant's PCP, Leamon Arnt, MD.   Ms. Haggard was given information about Chronic Care Management services today including:  1. CCM service includes personalized support from designated clinical staff supervised by her physician, including individualized plan of care and coordination with other care providers 2. 24/7 contact phone numbers for assistance for urgent and routine care needs. 3. Service will only be billed when office clinical staff spend 20 minutes or more in a month to coordinate care. 4. Only one practitioner may furnish and bill the service in a calendar month. 5. The patient may stop CCM services at any time (effective at the end of the month) by phone call to the office staff.   Patient agreed to services and verbal consent obtained.   Follow up plan:   [name] Upstream Scheduler

## 2020-08-29 ENCOUNTER — Other Ambulatory Visit: Payer: Self-pay | Admitting: Family Medicine

## 2020-09-12 DIAGNOSIS — Z86010 Personal history of colonic polyps: Secondary | ICD-10-CM | POA: Diagnosis not present

## 2020-09-12 DIAGNOSIS — K219 Gastro-esophageal reflux disease without esophagitis: Secondary | ICD-10-CM | POA: Diagnosis not present

## 2020-09-12 DIAGNOSIS — K625 Hemorrhage of anus and rectum: Secondary | ICD-10-CM | POA: Diagnosis not present

## 2020-09-15 DIAGNOSIS — M47816 Spondylosis without myelopathy or radiculopathy, lumbar region: Secondary | ICD-10-CM | POA: Diagnosis not present

## 2020-09-17 DIAGNOSIS — H43819 Vitreous degeneration, unspecified eye: Secondary | ICD-10-CM | POA: Diagnosis not present

## 2020-09-17 DIAGNOSIS — Z961 Presence of intraocular lens: Secondary | ICD-10-CM | POA: Diagnosis not present

## 2020-09-17 DIAGNOSIS — Z947 Corneal transplant status: Secondary | ICD-10-CM | POA: Diagnosis not present

## 2020-09-23 ENCOUNTER — Telehealth: Payer: Self-pay

## 2020-09-23 NOTE — Progress Notes (Signed)
Called and spoke with patient regarding appointment tomorrow 09-24-20 at [facility] .  Patient would like to cancel appointment and would not like to reschedule at this time.  Georgiana Shore ,[facility] Pharmacist Assistant [phone]

## 2020-09-24 ENCOUNTER — Ambulatory Visit: Payer: PPO

## 2020-09-25 ENCOUNTER — Other Ambulatory Visit: Payer: Self-pay | Admitting: Family Medicine

## 2020-09-25 DIAGNOSIS — Z780 Asymptomatic menopausal state: Secondary | ICD-10-CM

## 2020-09-25 DIAGNOSIS — Z1231 Encounter for screening mammogram for malignant neoplasm of breast: Secondary | ICD-10-CM

## 2020-10-22 DIAGNOSIS — M5136 Other intervertebral disc degeneration, lumbar region: Secondary | ICD-10-CM | POA: Diagnosis not present

## 2020-10-22 DIAGNOSIS — M4316 Spondylolisthesis, lumbar region: Secondary | ICD-10-CM | POA: Diagnosis not present

## 2020-10-22 DIAGNOSIS — M4126 Other idiopathic scoliosis, lumbar region: Secondary | ICD-10-CM | POA: Diagnosis not present

## 2020-10-22 DIAGNOSIS — M47816 Spondylosis without myelopathy or radiculopathy, lumbar region: Secondary | ICD-10-CM | POA: Diagnosis not present

## 2020-11-17 ENCOUNTER — Other Ambulatory Visit: Payer: Self-pay | Admitting: Family Medicine

## 2020-11-18 ENCOUNTER — Ambulatory Visit: Payer: PPO | Admitting: Physician Assistant

## 2020-11-20 ENCOUNTER — Other Ambulatory Visit: Payer: Self-pay | Admitting: Family Medicine

## 2020-11-26 ENCOUNTER — Other Ambulatory Visit: Payer: Self-pay | Admitting: Neurology

## 2020-12-10 ENCOUNTER — Ambulatory Visit: Payer: PPO | Admitting: Family Medicine

## 2020-12-15 ENCOUNTER — Other Ambulatory Visit: Payer: Self-pay | Admitting: Family Medicine

## 2020-12-15 ENCOUNTER — Encounter: Payer: Self-pay | Admitting: Family Medicine

## 2020-12-15 ENCOUNTER — Ambulatory Visit (INDEPENDENT_AMBULATORY_CARE_PROVIDER_SITE_OTHER): Payer: PPO | Admitting: Family Medicine

## 2020-12-15 ENCOUNTER — Other Ambulatory Visit: Payer: Self-pay

## 2020-12-15 VITALS — BP 126/68 | HR 61 | Temp 97.7°F | Ht 64.0 in | Wt 157.0 lb

## 2020-12-15 DIAGNOSIS — D126 Benign neoplasm of colon, unspecified: Secondary | ICD-10-CM | POA: Diagnosis not present

## 2020-12-15 DIAGNOSIS — L719 Rosacea, unspecified: Secondary | ICD-10-CM | POA: Diagnosis not present

## 2020-12-15 DIAGNOSIS — E782 Mixed hyperlipidemia: Secondary | ICD-10-CM

## 2020-12-15 DIAGNOSIS — N3946 Mixed incontinence: Secondary | ICD-10-CM

## 2020-12-15 DIAGNOSIS — N951 Menopausal and female climacteric states: Secondary | ICD-10-CM | POA: Diagnosis not present

## 2020-12-15 DIAGNOSIS — E039 Hypothyroidism, unspecified: Secondary | ICD-10-CM

## 2020-12-15 DIAGNOSIS — R7303 Prediabetes: Secondary | ICD-10-CM

## 2020-12-15 DIAGNOSIS — G250 Essential tremor: Secondary | ICD-10-CM

## 2020-12-15 DIAGNOSIS — K219 Gastro-esophageal reflux disease without esophagitis: Secondary | ICD-10-CM

## 2020-12-15 DIAGNOSIS — Z0000 Encounter for general adult medical examination without abnormal findings: Secondary | ICD-10-CM

## 2020-12-15 HISTORY — DX: Mixed incontinence: N39.46

## 2020-12-15 LAB — COMPREHENSIVE METABOLIC PANEL
ALT: 15 U/L (ref 0–35)
AST: 17 U/L (ref 0–37)
Albumin: 4.4 g/dL (ref 3.5–5.2)
Alkaline Phosphatase: 49 U/L (ref 39–117)
BUN: 17 mg/dL (ref 6–23)
CO2: 25 mEq/L (ref 19–32)
Calcium: 9.8 mg/dL (ref 8.4–10.5)
Chloride: 108 mEq/L (ref 96–112)
Creatinine, Ser: 0.77 mg/dL (ref 0.40–1.20)
GFR: 74.62 mL/min (ref >60.00)
Glucose, Bld: 112 mg/dL — ABNORMAL HIGH (ref 70–99)
Potassium: 4.2 mEq/L (ref 3.5–5.1)
Sodium: 142 mEq/L (ref 135–145)
Total Bilirubin: 0.4 mg/dL (ref 0.2–1.2)
Total Protein: 7.2 g/dL (ref 6.0–8.3)

## 2020-12-15 LAB — LIPID PANEL
Cholesterol: 175 mg/dL (ref 0–200)
HDL: 57.7 mg/dL (ref >39.00)
LDL Cholesterol: 99 mg/dL (ref 0–99)
NonHDL: 117.69
Total CHOL/HDL Ratio: 3
Triglycerides: 94 mg/dL (ref 0.0–149.0)
VLDL: 18.8 mg/dL (ref 0.0–40.0)

## 2020-12-15 LAB — CBC WITH DIFFERENTIAL/PLATELET
Basophils Absolute: 0 10*3/uL (ref 0.0–0.1)
Basophils Relative: 0.3 % (ref 0.0–3.0)
Eosinophils Absolute: 0.1 10*3/uL (ref 0.0–0.7)
Eosinophils Relative: 2.5 % (ref 0.0–5.0)
HCT: 40.1 % (ref 36.0–46.0)
Hemoglobin: 13.9 g/dL (ref 12.0–15.0)
Lymphocytes Relative: 41.2 % (ref 12.0–46.0)
Lymphs Abs: 2.3 10*3/uL (ref 0.7–4.0)
MCHC: 34.6 g/dL (ref 30.0–36.0)
MCV: 91.7 fl (ref 78.0–100.0)
Monocytes Absolute: 0.3 10*3/uL (ref 0.1–1.0)
Monocytes Relative: 5.2 % (ref 3.0–12.0)
Neutro Abs: 2.8 10*3/uL (ref 1.4–7.7)
Neutrophils Relative %: 50.8 % (ref 43.0–77.0)
Platelets: 184 10*3/uL (ref 150.0–400.0)
RBC: 4.37 Mil/uL (ref 3.87–5.11)
RDW: 13.4 % (ref 11.5–15.5)
WBC: 5.5 10*3/uL (ref 4.0–10.5)

## 2020-12-15 LAB — TSH: TSH: 2.06 u[IU]/mL (ref 0.35–4.50)

## 2020-12-15 LAB — HEMOGLOBIN A1C: Hgb A1c MFr Bld: 6 % (ref 4.6–6.5)

## 2020-12-15 MED ORDER — GABAPENTIN 600 MG PO TABS: 600.0000 mg | ORAL_TABLET | Freq: Two times a day (BID) | ORAL | 3 refills | Status: DC

## 2020-12-15 MED ORDER — VENLAFAXINE HCL ER 37.5 MG PO CP24: 37.5000 mg | ORAL_CAPSULE | Freq: Every day | ORAL | 3 refills | Status: DC

## 2020-12-15 MED ORDER — SOLIFENACIN SUCCINATE 5 MG PO TABS: 5.0000 mg | ORAL_TABLET | Freq: Every day | ORAL | 3 refills | Status: DC

## 2020-12-15 NOTE — Progress Notes (Signed)
Subjective  Chief Complaint  Patient presents with  . Annual Exam    HPI: Laura Farrell is a 75 y.o. female who presents to [facility] today for a Female Wellness Visit. She also has the concerns and/or needs as listed above in the chief complaint. These will be addressed in addition to the Health Maintenance Visit.   Wellness Visit: annual visit with health maintenance review and exam without Pap   HM: Mammogram and bone density scheduled this month.  Had referral with GI who has changed her colonoscopy to every 7 years.  Thus she will be due again in 2 years.  Has history of tubular adenoma.  Her immunizations are up-to-date.  She has been trying to eat better.  She is started training with a Physiological scientist, working on Scientist, water quality.  Feeling stronger. Chronic disease f/u and/or acute problem visit: (deemed necessary to be done in addition to the wellness visit):  Hypothyroidism: Continues on daily medications without adverse effects.  Energy levels are good.  Hyperlipidemia on statin and fenofibrate.  Tolerating well without muscle aches.  Goal LDL less than 100 at least.  History of prediabetes: Continues to exercise and eat better.  We will recheck today.  She may have resolved this.  Menopausal hot flashes: Restart gabapentin at night.  She did not restart Effexor.  She says things were well controlled when she was on Effexor.  She would be a candidate for hormone replacement therapy again if you cannot get controlled on other medications.  Complains of of worsening bladder function.  Now wearing a pad daily.  Has nocturia 2-3 times nightly.  He has urge symptoms.  His stress incontinence symptoms.  No irritative symptoms.  No recent UTIs.  No vaginal discharge.  Has never taken medications.  Chronic rosacea on MetroCream without significant improvement.  Has appoint with dermatology in a few weeks.  GERD is well controlled on  omeprazole 20 mg daily.  Essential tremor: Managed with medications by neurology.  Reviewed today.  Assessment  1. Annual physical exam   2. Acquired hypothyroidism   3. Mixed hyperlipidemia   4. Prediabetes   5. Menopausal hot flushes   6. Tubular adenoma of colon   7. Mixed stress and urge urinary incontinence   8. Rosacea   9. Gastroesophageal reflux disease without esophagitis   10. Essential tremor      Plan  Female Wellness Visit:  Age appropriate Health Maintenance and Prevention measures were discussed with patient. Included topics are cancer screening recommendations, ways to keep healthy (see AVS) including dietary and exercise recommendations, regular eye and dental care, use of seat belts, and avoidance of moderate alcohol use and tobacco use.  Mammogram and bone density scheduled.  Colonoscopy up-to-date.  Updated health maintenance.  BMI: discussed patient's BMI and encouraged positive lifestyle modifications to help get to or maintain a target BMI.  HM needs and immunizations were addressed and ordered. See below for orders. See HM and immunization section for updates.  Routine labs and screening tests ordered including cmp, cbc and lipids where appropriate.  Discussed recommendations regarding Vit D and calcium supplementation (see AVS)  Chronic disease management visit and/or acute problem visit:  Hypothyroidism: Clinically euthyroid.  Recheck levels today.  She is compliant with medications.  Mixed hyperlipidemia on fenofibrate and statin.  Recheck liver tests in lipid panel today.  Eating better.  Prediabetes: Recheck today.  Continue exercise and diet.  Menopausal hot  flashes: Moderate to severe.  Discussed options of treatment.  Will increase gabapentin to twice daily dosing and restart Effexor 37.5 mg daily.  Mixed stress and urge incontinence: Education given.  Recommend Kegel exercises and trial of Vesicare.  Rosacea: Recommend follow-up with  dermatology for other options.  GERD and essential tremor are controlled on current medications.  No changes made today.   Follow up: 6 months to recheck sugars, bladder function and hot flashes Orders Placed This Encounter  Procedures  . CBC with Differential/Platelet  . Comprehensive metabolic panel  . Lipid panel  . TSH  . Hemoglobin A1c   Meds ordered this encounter  Medications  . gabapentin (NEURONTIN) 600 MG tablet    Sig: Take 1 tablet (600 mg total) by mouth 2 (two) times daily.    Dispense:  180 tablet    Refill:  3  . venlafaxine XR (EFFEXOR-XR) 37.5 MG 24 hr capsule    Sig: Take 1 capsule (37.5 mg total) by mouth daily.    Dispense:  90 capsule    Refill:  3  . solifenacin (VESICARE) 5 MG tablet    Sig: Take 1 tablet (5 mg total) by mouth daily.    Dispense:  90 tablet    Refill:  3      Body mass index is 26.95 kg/m. Wt Readings from Last 3 Encounters:  12/15/20 157 lb (71.2 kg)  06/10/20 170 lb 3.2 oz (77.2 kg)  02/26/20 176 lb 6.4 oz (80 kg)     Patient Active Problem List   Diagnosis Date Noted  . Prediabetes 12/27/2019    Priority: High  . Tubular adenoma of colon 12/27/2019    Priority: High    Colonoscopy, Dr. Cristina Gong, 02/2015: tubular adenoma, 5 year recall   . Family history of breast cancer in sister 08/14/2018    Priority: High  . Hyperlipidemia 06/28/2012    Priority: High  . Hypothyroidism 06/28/2012    Priority: High  . OSA (obstructive sleep apnea) 05/24/2012    Priority: High    HST 2013:  AHI 12/hr   . Essential tremor 03/20/2012    Priority: High    Sees Dr. Carles Collet, neurology   . Osteoarthritis of left knee 04/10/2019    Priority: Medium  . Chronic tension headaches 08/14/2018    Priority: Medium  . Menopausal hot flushes 08/14/2018    Priority: Medium    On effexor and gabapentin   . Bilateral hydronephrosis 09/10/2013    Priority: Medium    Mild per MRI LS spine 02-2013   . GERD (gastroesophageal reflux disease)  06/28/2012    Priority: Medium  . History of cluster headache 06/28/2012    Priority: Medium  . Notalgia paresthetica 09/25/2018    Priority: Low  . HLP (hyperkeratosis lenticularis perstans)     Priority: Low  . Mixed stress and urge urinary incontinence 12/15/2020  . Rosacea 06/10/2020  . At high risk for breast cancer 09/13/2016  . Status post corneal transplant 11/06/2009    Formatting of this note might be different from the original. [facility] Dr. Susa Simmonds    Health Maintenance  Topic Date Due  . DEXA SCAN  09/12/2020  . MAMMOGRAM  12/24/2020  . COVID-19 Vaccine (4 - Booster for Pfizer series) 01/18/2021  . COLONOSCOPY (Pts 45-18yrs Insurance coverage will need to be confirmed)  04/15/2022  . TETANUS/TDAP  06/28/2022  . INFLUENZA VACCINE  Completed  . Hepatitis C Screening  Completed  . PNA vac Low Risk Adult  Completed  . HPV VACCINES  Aged Out   Immunization History  Administered Date(s) Administered  . Fluad Quad(high Dose 65+) 07/18/2019, 06/23/2020  . Influenza Split 05/28/2010, 09/11/2011, 09/23/2011, 06/28/2012, 08/01/2014  . Influenza, High Dose Seasonal PF 08/02/2013, 08/28/2015, 05/31/2017, 07/19/2018  . Influenza, Quadrivalent, Recombinant, Inj, Pf 08/05/2017  . Influenza,inj,quad, With Preservative 08/05/2017  . Influenza-Unspecified 08/11/2016, 08/05/2017  . PFIZER(Purple Top)SARS-COV-2 Vaccination 11/05/2019, 11/26/2019, 07/20/2020  . Pneumococcal Conjugate-13 10/29/2003, 12/24/2016  . Pneumococcal Polysaccharide-23 08/21/2012  . Tdap 06/28/2012, 06/28/2012  . Zoster 10/28/2008  . Zoster Recombinat (Shingrix) 01/24/2017, 04/26/2017   We updated and reviewed the patient's past history in detail and it is documented below. Allergies: Patient has No Known Allergies. Past Medical History Patient  has a past medical history of Chronic tension headaches (08/14/2018), Diabetes mellitus, Headache(784.0), HLP (hyperkeratosis lenticularis perstans), Hot flashes,  Hyperlipidemia, Menopausal hot flushes (08/14/2018), Nephrolithiasis, Over weight, Thyroid disease, Tubular adenoma of colon (12/27/2019), and Urine incontinence. Past Surgical History Patient  has a past surgical history that includes Abdominal hysterectomy; left shoulder; lower back surgery; cornea implant; tubal ligation (1980); Vesicovaginal fistula closure w/ TAH (1995); Spine surgery (2005); Appendectomy; Cataract extraction (Right, 2017); Breast excisional biopsy (Left); Breast excisional biopsy (Left); and Breast excisional biopsy (Left). Family History: Patient family history includes Breast cancer (age of onset: 78) in her sister; Cancer in her father; Pancreatic cancer in her mother; Tremor in her brother, father, mother, and sister. Social History:  Patient  reports that she has never smoked. She has never used smokeless tobacco. She reports that she does not drink alcohol and does not use drugs.  Review of Systems: Constitutional: negative for fever or malaise Ophthalmic: negative for photophobia, double vision or loss of vision Cardiovascular: negative for chest pain, dyspnea on exertion, or new LE swelling Respiratory: negative for SOB or persistent cough Gastrointestinal: negative for abdominal pain, change in bowel habits or melena Genitourinary: negative for dysuria or gross hematuria, no abnormal uterine bleeding or disharge Musculoskeletal: negative for new gait disturbance or muscular weakness Integumentary: negative for new or persistent rashes, no breast lumps Neurological: negative for TIA or stroke symptoms Psychiatric: negative for SI or delusions Allergic/Immunologic: negative for hives  Patient Care Team    Relationship Specialty Notifications Start End  Leamon Arnt, MD PCP - General Family Medicine  08/14/18   Ronald Lobo, MD Consulting Physician Gastroenterology  08/14/18   Warren Danes, PA-C Physician Assistant Dermatology  09/15/18   [facility]th[facility], [facility]   Specialist  09/15/18   Marilynne Halsted, MD Referring Physician Ophthalmology  09/15/18   Tat, Eustace Quail, DO Consulting Physician Neurology  12/13/18   Gordy Levan, MD  Oncology  09/11/19   Sydnee Cabal, MD Consulting Physician Orthopedic Surgery  09/11/19   Erline Levine, MD Consulting Physician Neurosurgery  12/27/19   Madelin Rear, [facility] Pharmacist Pharmacist  08/07/20    Comment: [phone]    Objective  Vitals: BP 126/68   Pulse 61   Temp 97.7 F (36.5 C)   Ht 5\' 4"  (1.626 m)   Wt 157 lb (71.2 kg)   SpO2 98%   BMI 26.95 kg/m  General:  Well developed, well nourished, no acute distress  Psych:  Alert and orientedx3,normal mood and affect HEENT:  Normocephalic, atraumatic, non-icteric sclera,  supple neck without adenopathy, mass or thyromegaly Cardiovascular:  Normal S1, S2, RRR without gallop, rub or murmur Respiratory:  Good breath sounds bilaterally, CTAB with normal respiratory effort Gastrointestinal: normal bowel sounds, soft, non-tender, no  noted masses. No HSM MSK: no deformities, contusions. Joints are without erythema or swelling.  Skin:  Warm, no rashes or suspicious lesions noted     Commons side effects, risks, benefits, and alternatives for medications and treatment plan prescribed today were discussed, and the patient expressed understanding of the given instructions. Patient is instructed to call or message via MyChart if he/she has any questions or concerns regarding our treatment plan. No barriers to understanding were identified. We discussed Red Flag symptoms and signs in detail. Patient expressed understanding regarding what to do in case of urgent or emergency type symptoms.   Medication list was reconciled, printed and provided to the patient in AVS. Patient instructions and summary information was reviewed with the patient as documented in the AVS. This note was prepared with assistance of Dragon voice recognition software. Occasional wrong-word or  sound-a-like substitutions may have occurred due to the inherent limitations of voice recognition software  This visit occurred during the SARS-CoV-2 public health emergency.  Safety protocols were in place, including screening questions prior to the visit, additional usage of staff PPE, and extensive cleaning of exam room while observing appropriate contact time as indicated for disinfecting solutions.

## 2020-12-15 NOTE — Patient Instructions (Addendum)
Please return in 6 months to recheck bladder and sugars and hot flushes. Sooner if needed.  I will release your lab results to you on your MyChart account with further instructions. Please reply with any questions.   If you have any questions or concerns, please don't hesitate to send me a message via MyChart or call the office at [phone]. Thank you for visiting with [place] today! It's our pleasure caring for you.   Overactive Bladder, Adult  Overactive bladder is a condition in which a person has a sudden and frequent need to urinate. A person might also leak urine if he or she cannot get to the bathroom fast enough (urinary incontinence). Sometimes, symptoms can interfere with work or social activities. What are the causes? Overactive bladder is associated with poor nerve signals between your bladder and your brain. Your bladder may get the signal to empty before it is full. You may also have very sensitive muscles that make your bladder squeeze too soon. This condition may also be caused by other factors, such as:  Medical conditions: ? Urinary tract infection. ? Infection of nearby tissues. ? Prostate enlargement. ? Bladder stones, inflammation, or tumors. ? Diabetes. ? Muscle or nerve weakness, especially from these conditions:  A spinal cord injury.  Stroke.  Multiple sclerosis.  Parkinson's disease.  Other causes: ? Surgery on the uterus or urethra. ? Drinking too much caffeine or alcohol. ? Certain medicines, especially those that eliminate extra fluid in the body (diuretics). ? Constipation. What increases the risk? You may be at greater risk for overactive bladder if you:  Are an older adult.  Smoke.  Are going through menopause.  Have prostate problems.  Have a neurological disease, such as stroke, dementia, Parkinson's disease, or multiple sclerosis (MS).  Eat or drink alcohol, spicy food, caffeine, and other things that irritate the bladder.  Are  overweight or obese. What are the signs or symptoms? Symptoms of this condition include a sudden, strong urge to urinate. Other symptoms include:  Leaking urine.  Urinating 8 or more times a day.  Waking up to urinate 2 or more times overnight. How is this diagnosed? This condition may be diagnosed based on:  Your symptoms and medical history.  A physical exam.  Blood or urine tests to check for possible causes, such as infection. You may also need to see a health care provider who specializes in urinary tract problems. This is called a urologist. How is this treated? Treatment for overactive bladder depends on the cause of your condition and whether it is mild or severe. Treatment may include:  Bladder training, such as: ? Learning to control the urge to urinate by following a schedule to urinate at regular intervals. ? Doing Kegel exercises to strengthen the pelvic floor muscles that support your bladder.  Special devices, such as: ? Biofeedback. This uses sensors to help you become aware of your body's signals. ? Electrical stimulation. This uses electrodes placed inside the body (implanted) or outside the body. These electrodes send gentle pulses of electricity to strengthen the nerves or muscles that control the bladder. ? Women may use a plastic device, called a pessary, that fits into the vagina and supports the bladder.  Medicines, such as: ? Antibiotics to treat bladder infection. ? Antispasmodics to stop the bladder from releasing urine at the wrong time. ? Tricyclic antidepressants to relax bladder muscles. ? Injections of botulinum toxin type A directly into the bladder tissue to relax bladder muscles.  Surgery,  such as: ? A device may be implanted to help manage the nerve signals that control urination. ? An electrode may be implanted to stimulate electrical signals in the bladder. ? A procedure may be done to change the shape of the bladder. This is done only in  very severe cases. Follow these instructions at home: Eating and drinking  Make diet or lifestyle changes recommended by your health care provider. These may include: ? Drinking fluids throughout the day and not only with meals. ? Cutting down on caffeine or alcohol. ? Eating a healthy and balanced diet to prevent constipation. This may include:  Choosing foods that are high in fiber, such as beans, whole grains, and fresh fruits and vegetables.  Limiting foods that are high in fat and processed sugars, such as fried and sweet foods.   Lifestyle  Lose weight if needed.  Do not use any products that contain nicotine or tobacco. These include cigarettes, chewing tobacco, and vaping devices, such as e-cigarettes. If you need help quitting, ask your health care provider.   General instructions  Take over-the-counter and prescription medicines only as told by your health care provider.  If you were prescribed an antibiotic medicine, take it as told by your health care provider. Do not stop taking the antibiotic even if you start to feel better.  Use any implants or pessary as told by your health care provider.  If needed, wear pads to absorb urine leakage.  Keep a log to track how much and when you drink, and when you need to urinate. This will help your health care provider monitor your condition.  Keep all follow-up visits. This is important. Contact a health care provider if:  You have a fever or chills.  Your symptoms do not get better with treatment.  Your pain and discomfort get worse.  You have more frequent urges to urinate. Get help right away if:  You are not able to control your bladder. Summary  Overactive bladder refers to a condition in which a person has a sudden and frequent need to urinate.  Several conditions may lead to an overactive bladder.  Treatment for overactive bladder depends on the cause and severity of your condition.  Making lifestyle  changes, doing Kegel exercises, keeping a log, and taking medicines can help with this condition. This information is not intended to replace advice given to you by your health care provider. Make sure you discuss any questions you have with your health care provider. Document Revised: 06/16/2020 Document Reviewed: 06/16/2020 Elsevier Patient Education  2021 [employer].

## 2020-12-18 ENCOUNTER — Encounter: Payer: Self-pay | Admitting: Physician Assistant

## 2020-12-18 ENCOUNTER — Other Ambulatory Visit: Payer: Self-pay

## 2020-12-18 ENCOUNTER — Ambulatory Visit: Payer: PPO | Admitting: Physician Assistant

## 2020-12-18 DIAGNOSIS — L738 Other specified follicular disorders: Secondary | ICD-10-CM

## 2020-12-18 DIAGNOSIS — L820 Inflamed seborrheic keratosis: Secondary | ICD-10-CM | POA: Diagnosis not present

## 2020-12-29 DIAGNOSIS — M4316 Spondylolisthesis, lumbar region: Secondary | ICD-10-CM | POA: Diagnosis not present

## 2020-12-29 DIAGNOSIS — M4126 Other idiopathic scoliosis, lumbar region: Secondary | ICD-10-CM | POA: Diagnosis not present

## 2020-12-29 DIAGNOSIS — M545 Low back pain, unspecified: Secondary | ICD-10-CM | POA: Diagnosis not present

## 2020-12-29 DIAGNOSIS — M5416 Radiculopathy, lumbar region: Secondary | ICD-10-CM | POA: Diagnosis not present

## 2020-12-31 ENCOUNTER — Other Ambulatory Visit: Payer: Self-pay | Admitting: Family Medicine

## 2020-12-31 DIAGNOSIS — E2839 Other primary ovarian failure: Secondary | ICD-10-CM

## 2021-01-02 NOTE — Progress Notes (Signed)
Assessment/Plan:    1.  Tremor  -Her tremor has been difficult, as objective tremor findings have been incongruent with subjective complaints.  This continues today although I did tell her she could try to take propranolol tid last visit and she didn't.  I didn't ask her to do that today because BP and pulse are fairly low.  -instead of primidone, 50 mg, 3 tablets bid pt would like to try 2 in the AM and 4 at night for now (to see if helps sleepy and helps AM tremor).  If not, will slightly increase the primidone dosage.    -Continue propranolol, 10 mg 2 times per day  -don't want to start topamax d/t remote hx of nephrolithiasis  -told her options limited.  Not dbs candidate d/t very mild sx's.  2.  Fatigue  -may be due to meds (multiple), including the ones that I prescribe  -she just started back at [employer]  Subjective:   Laura Farrell was seen today in follow up for tremor.  My previous records were reviewed prior to todays visit.  Her propranolol was increased last visit but it turns out she didn't do that.  She continues to complain of disabiling tremor - trouble putting in her contacts and some trouble holding her book.  Sx's are worse in the AM.  No lightheadedness or near syncope with that.  Medical records reviewed since last visit.  She has followed with Dr. Vertell Limber for back pain and spondylolisthesis.  She has had injections with some relief.  She last saw her primary care doctor on March 7.  Those notes are reviewed.  Her blood pressure was very good during that visit (not to low) although pulse was in the 60s. She is trying to increase exercise - started back at [employer] yesterday.  She is c/o fatigue.  Current prescribed movement disorder medications: Propranolol, 10 mg 3 times per day (increased last visit but pt reports only on bid) Primidone, 150 mg twice per day (increased last visit). (On gabapentin 600 mg twice per day for other issues)  ALLERGIES:   Allergies  Allergen  Reactions  . Other Other (See Comments)    Liquid medication    CURRENT MEDICATIONS:  Outpatient Encounter Medications as of 01/07/2021  Medication Sig  . aspirin 81 MG tablet Take 81 mg by mouth daily.  Marland Kitchen atorvastatin (LIPITOR) 80 MG tablet TAKE ONE TABLET BY MOUTH EVERY DAY  . diclofenac Sodium (VOLTAREN) 1 % GEL Apply 2 g topically 4 (four) times daily.  . fenofibrate 54 MG tablet TAKE ONE TABLET BY MOUTH DAILY WITH a MEAL  . gabapentin (NEURONTIN) 600 MG tablet Take 1 tablet (600 mg total) by mouth 2 (two) times daily. (Patient taking differently: Take 600 mg by mouth 2 (two) times daily. Takes once a day)  . levothyroxine (SYNTHROID) 88 MCG tablet TAKE ONE TABLET BY MOUTH DAILY  . methocarbamol (ROBAXIN) 500 MG tablet Take 500 mg by mouth as needed.  . metroNIDAZOLE (METROCREAM) 0.75 % cream Apply topically 2 (two) times daily.  Marland Kitchen omeprazole (PRILOSEC) 20 MG capsule TAKE ONE CAPSULE BY MOUTH EVERY DAY  . prednisoLONE acetate (PRED FORTE) 1 % ophthalmic suspension PLACE 1 DROP INTO LEFT EYE DAILY  . primidone (MYSOLINE) 50 MG tablet TAKE THREE TABLETS BY MOUTH EVERY MORNING AND 3 TABLETS BY MOUTH EVERY EVENING  . propranolol (INDERAL) 10 MG tablet Take 1 tablet (10 mg total) by mouth in the morning, at noon, and at bedtime. (  Patient taking differently: Take 10 mg by mouth 2 (two) times daily.)  . solifenacin (VESICARE) 5 MG tablet Take 1 tablet (5 mg total) by mouth daily.  . traMADol (ULTRAM) 50 MG tablet Take 50 mg by mouth every 6 (six) hours as needed.  . venlafaxine XR (EFFEXOR-XR) 37.5 MG 24 hr capsule Take 1 capsule (37.5 mg total) by mouth daily.  . phentermine (ADIPEX-P) 37.5 MG tablet TAKE 1/2-1 TABLET BY MOUTH DAILY AS NEEDED (Patient not taking: Reported on 01/07/2021)   No facility-administered encounter medications on file as of 01/07/2021.     Objective:    PHYSICAL EXAMINATION:    VITALS:   Vitals:   01/07/21 0834  BP: 104/64  Pulse: 63  SpO2: 99%  Weight: 172  lb (78 kg)  Height: 5\' 4"  (1.626 m)    GEN:  The patient appears stated age and is in NAD. HEENT:  Normocephalic, atraumatic.  The mucous membranes are moist.   Neurological examination:  Orientation: The patient is alert and oriented x3. Cranial nerves: There is good facial symmetry. The speech is fluent and clear. Soft palate rises symmetrically and there is no tongue deviation. Hearing is intact to conversational tone. Sensation: Sensation is intact to light touch throughout Motor: Strength is at least antigravity x4.  Movement examination: Tone: There is normal tone in the UE/LE Abnormal movements: she has no rest tremor.  Mild postural tremor in the L pinky and ring fingers.  Minimal trouble with archimedes spirals on the L Coordination:  There is no decremation with RAM's, Gait and Station: The patient has no difficulty arising out of a deep-seated chair without the use of the hands. The patient's stride length is good I have reviewed and interpreted the following labs independently   Chemistry      Component Value Date/Time   NA 142 12/15/2020 0834   NA 141 09/13/2016 0803   K 4.2 12/15/2020 0834   K 4.4 09/13/2016 0803   CL 108 12/15/2020 0834   CO2 25 12/15/2020 0834   CO2 23 09/13/2016 0803   BUN 17 12/15/2020 0834   BUN 15.8 09/13/2016 0803   CREATININE 0.77 12/15/2020 0834   CREATININE 0.9 09/13/2016 0803      Component Value Date/Time   CALCIUM 9.8 12/15/2020 0834   CALCIUM 9.8 09/13/2016 0803   ALKPHOS 49 12/15/2020 0834   ALKPHOS 73 09/13/2016 0803   AST 17 12/15/2020 0834   AST 20 09/13/2016 0803   ALT 15 12/15/2020 0834   ALT 19 09/13/2016 0803   BILITOT 0.4 12/15/2020 0834   BILITOT 0.25 09/13/2016 0803     Lab Results  Component Value Date   TSH 2.06 12/15/2020      Total time spent on today's visit was 25 minutes, including both face-to-face time and nonface-to-face time.  Time included that spent on review of records (prior notes available  to me/labs/imaging if pertinent), discussing treatment and goals, answering patient's questions and coordinating care.  Cc:  Leamon Arnt, MD

## 2021-01-05 ENCOUNTER — Ambulatory Visit: Payer: PPO

## 2021-01-05 ENCOUNTER — Encounter: Payer: Self-pay | Admitting: Physician Assistant

## 2021-01-05 ENCOUNTER — Other Ambulatory Visit: Payer: PPO

## 2021-01-05 NOTE — Progress Notes (Signed)
   Follow-Up Visit   Subjective  Laura Farrell is a 75 y.o. female who presents for the following: Skin Problem (Patient here today for places on her neck x unsure per patient the places do bleed, itching and no pain. Recheck patient's face redness x years and places on her left eyelid x 6 months itching ).   The following portions of the chart were reviewed this encounter and updated as appropriate:      Objective  Well appearing patient in no apparent distress; mood and affect are within normal limits.  All skin waist up examined.  Objective  Chest - Medial (Center) (20): Erythematous stuck-on, waxy papule or plaque.   Objective  Mid Forehead: Small yellow papules with a central dell.    Assessment & Plan  Seborrheic keratosis, inflamed (20) Chest - Medial (Center)  Destruction of lesion - Chest - Medial (Center) Complexity: simple   Destruction method: cryotherapy   Informed consent: discussed and consent obtained   Timeout:  patient name, date of birth, surgical site, and procedure verified Lesion destroyed using liquid nitrogen: Yes   Cryotherapy cycles:  3 Outcome: patient tolerated procedure well with no complications    Sebaceous hyperplasia of face Mid Forehead  Okay to leave if stable    I, Lysette Lindenbaum, PA-C, have reviewed all documentation's for this visit.  The documentation on 01/05/21 for the exam, diagnosis, procedures and orders are all accurate and complete.

## 2021-01-06 ENCOUNTER — Ambulatory Visit
Admission: RE | Admit: 2021-01-06 | Discharge: 2021-01-06 | Disposition: A | Discharge status: Home / Self Care | Payer: PPO | Source: Ambulatory Visit | Attending: Family Medicine | Admitting: Family Medicine

## 2021-01-06 ENCOUNTER — Other Ambulatory Visit: Payer: Self-pay

## 2021-01-06 ENCOUNTER — Ambulatory Visit: Payer: PPO | Admitting: Neurology

## 2021-01-06 DIAGNOSIS — Z1231 Encounter for screening mammogram for malignant neoplasm of breast: Secondary | ICD-10-CM | POA: Diagnosis not present

## 2021-01-07 ENCOUNTER — Ambulatory Visit: Payer: PPO | Admitting: Neurology

## 2021-01-07 ENCOUNTER — Encounter: Payer: Self-pay | Admitting: Neurology

## 2021-01-07 VITALS — BP 104/64 | HR 63 | Ht 64.0 in | Wt 172.0 lb

## 2021-01-07 DIAGNOSIS — G250 Essential tremor: Secondary | ICD-10-CM | POA: Diagnosis not present

## 2021-01-07 MED ORDER — PRIMIDONE 50 MG PO TABS: ORAL_TABLET | ORAL | 2 refills | Status: DC

## 2021-01-07 MED ORDER — PROPRANOLOL HCL 10 MG PO TABS: 10.0000 mg | ORAL_TABLET | Freq: Two times a day (BID) | ORAL | 1 refills | Status: DC

## 2021-01-07 NOTE — Patient Instructions (Signed)
1.  Take primidone, 50 mg,2 in the AM and 4 at night for now.  If that doesn't help, call me and we can slightly increase the primidone dosage 2.  Continue propranolol 10 mg twice per day

## 2021-02-03 ENCOUNTER — Ambulatory Visit: Payer: PPO | Admitting: Physician Assistant

## 2021-02-11 ENCOUNTER — Other Ambulatory Visit: Payer: Self-pay | Admitting: Family Medicine

## 2021-02-16 ENCOUNTER — Other Ambulatory Visit: Payer: Self-pay | Admitting: Neurology

## 2021-02-26 ENCOUNTER — Other Ambulatory Visit: Payer: Self-pay | Admitting: Neurology

## 2021-02-26 ENCOUNTER — Other Ambulatory Visit: Payer: Self-pay | Admitting: Physician Assistant

## 2021-03-02 ENCOUNTER — Other Ambulatory Visit: Payer: Self-pay

## 2021-03-02 ENCOUNTER — Ambulatory Visit (INDEPENDENT_AMBULATORY_CARE_PROVIDER_SITE_OTHER): Payer: PPO | Admitting: Family Medicine

## 2021-03-02 ENCOUNTER — Ambulatory Visit (INDEPENDENT_AMBULATORY_CARE_PROVIDER_SITE_OTHER): Payer: PPO

## 2021-03-02 ENCOUNTER — Encounter: Payer: Self-pay | Admitting: Family Medicine

## 2021-03-02 VITALS — BP 118/76 | HR 60 | Temp 97.2°F | Wt 162.6 lb

## 2021-03-02 DIAGNOSIS — H43811 Vitreous degeneration, right eye: Secondary | ICD-10-CM | POA: Diagnosis not present

## 2021-03-02 DIAGNOSIS — Z961 Presence of intraocular lens: Secondary | ICD-10-CM | POA: Diagnosis not present

## 2021-03-02 DIAGNOSIS — M79671 Pain in right foot: Secondary | ICD-10-CM

## 2021-03-02 DIAGNOSIS — Z947 Corneal transplant status: Secondary | ICD-10-CM | POA: Diagnosis not present

## 2021-03-02 DIAGNOSIS — M170 Bilateral primary osteoarthritis of knee: Secondary | ICD-10-CM | POA: Diagnosis not present

## 2021-03-02 DIAGNOSIS — M19071 Primary osteoarthritis, right ankle and foot: Secondary | ICD-10-CM | POA: Diagnosis not present

## 2021-03-02 MED ORDER — DICLOFENAC SODIUM 75 MG PO TBEC: 75.0000 mg | DELAYED_RELEASE_TABLET | Freq: Two times a day (BID) | ORAL | 0 refills | Status: DC

## 2021-03-02 NOTE — Progress Notes (Signed)
Subjective  CC:  Chief Complaint  Patient presents with  . Foot Pain    Right foot arch pain. Walks 2 miles a day, and thinks that may contribute to it. No swelling.    HPI: Laura Farrell is a 75 y.o. female who presents to the office today to address the problems listed above in the chief complaint.  75 year old female with 1 -2 week history of right midfoot pain.  Had a walking program about a month ago.  Has been walking 2 miles per day.  However no longer able to do because of pain.  Also working with a Clinical research associate at gym.  No injuries.  He has good supportive walking shoes.  No swelling or redness of the foot.  No heel pain.  No ankle pain.  Bilateral knees: Hurting this morning.  No redness or swelling.  Has history of osteoarthritis and it is very injection.  That went well.   Assessment  1. Pain of midfoot, right   2. Primary osteoarthritis of both knees      Plan   Midfoot pain: Tender over dorsal joints.  Check x-rays for osteoarthritis.  Voltaren twice a day for a week or 2.  Then Voltaren gel.  Follow-up if not improving  Osteoarthritis of the knees: Flared, probably from weather changes.  Voltaren gel and diclofenac.  Follow-up if worsening.  Follow up: As needed 06/04/2021  Orders Placed This Encounter  Procedures  . DG Foot Complete Right   Meds ordered this encounter  Medications  . diclofenac (VOLTAREN) 75 MG EC tablet    Sig: Take 1 tablet (75 mg total) by mouth 2 (two) times daily.    Dispense:  30 tablet    Refill:  0      I reviewed the patients updated PMH, FH, and SocHx.    Patient Active Problem List   Diagnosis Date Noted  . Prediabetes 12/27/2019    Priority: High  . Tubular adenoma of colon 12/27/2019    Priority: High  . Family history of breast cancer in sister 08/14/2018    Priority: High  . Hyperlipidemia 06/28/2012    Priority: High  . Hypothyroidism 06/28/2012    Priority: High  . OSA (obstructive sleep apnea) 05/24/2012     Priority: High  . Essential tremor 03/20/2012    Priority: High  . Osteoarthritis of left knee 04/10/2019    Priority: Medium  . Chronic tension headaches 08/14/2018    Priority: Medium  . Menopausal hot flushes 08/14/2018    Priority: Medium  . Bilateral hydronephrosis 09/10/2013    Priority: Medium  . GERD (gastroesophageal reflux disease) 06/28/2012    Priority: Medium  . History of cluster headache 06/28/2012    Priority: Medium  . Notalgia paresthetica 09/25/2018    Priority: Low  . HLP (hyperkeratosis lenticularis perstans)     Priority: Low  . Mixed stress and urge urinary incontinence 12/15/2020  . Rosacea 06/10/2020  . At high risk for breast cancer 09/13/2016  . Status post corneal transplant 11/06/2009   Current Meds  Medication Sig  . aspirin 81 MG tablet Take 81 mg by mouth daily.  Marland Kitchen atorvastatin (LIPITOR) 80 MG tablet TAKE ONE TABLET BY MOUTH EVERY DAY  . diclofenac (VOLTAREN) 75 MG EC tablet Take 1 tablet (75 mg total) by mouth 2 (two) times daily.  . diclofenac Sodium (VOLTAREN) 1 % GEL Apply 2 g topically 4 (four) times daily.  . fenofibrate 54 MG tablet TAKE ONE TABLET  BY MOUTH DAILY WITH a MEAL  . gabapentin (NEURONTIN) 600 MG tablet Take 1 tablet (600 mg total) by mouth 2 (two) times daily. (Patient taking differently: Take 600 mg by mouth 2 (two) times daily. Takes once a day)  . levothyroxine (SYNTHROID) 88 MCG tablet TAKE ONE TABLET BY MOUTH DAILY  . methocarbamol (ROBAXIN) 500 MG tablet Take 500 mg by mouth as needed.  . metroNIDAZOLE (METROCREAM) 0.75 % cream Apply topically 2 (two) times daily.  Marland Kitchen omeprazole (PRILOSEC) 20 MG capsule TAKE ONE CAPSULE BY MOUTH EVERY DAY  . phentermine (ADIPEX-P) 37.5 MG tablet TAKE 1/2-1 TABLET BY MOUTH DAILY AS NEEDED  . prednisoLONE acetate (PRED FORTE) 1 % ophthalmic suspension PLACE 1 DROP INTO LEFT EYE DAILY  . primidone (MYSOLINE) 50 MG tablet 2 in the AM, 4 in the PM  . propranolol (INDERAL) 10 MG tablet Take 1  tablet (10 mg total) by mouth 2 (two) times daily.  . solifenacin (VESICARE) 5 MG tablet Take 1 tablet (5 mg total) by mouth daily.  . traMADol (ULTRAM) 50 MG tablet Take 50 mg by mouth every 6 (six) hours as needed.  . venlafaxine XR (EFFEXOR-XR) 37.5 MG 24 hr capsule Take 1 capsule (37.5 mg total) by mouth daily.    Allergies: Patient is allergic to other. Family History: Patient family history includes Breast cancer (age of onset: 45) in her sister; Cancer in her father; Pancreatic cancer in her mother; Tremor in her brother, father, mother, and sister. Social History:  Patient  reports that she has never smoked. She has never used smokeless tobacco. She reports that she does not drink alcohol and does not use drugs.  Review of Systems: Constitutional: Negative for fever malaise or anorexia Cardiovascular: negative for chest pain Respiratory: negative for SOB or persistent cough Gastrointestinal: negative for abdominal pain  Objective  Vitals: BP 118/76   Pulse 60   Temp (!) 97.2 F (36.2 C) (Temporal)   Wt 162 lb 9.6 oz (73.8 kg)   SpO2 97%   BMI 27.91 kg/m  General: no acute distress , A&Ox3 Knees: No erythema or effusion, full range of motion Right midfoot: Tender over the joint without redness or swelling.  No heel tenderness.  Full range of motion of the ankle.  Normal gait     Commons side effects, risks, benefits, and alternatives for medications and treatment plan prescribed today were discussed, and the patient expressed understanding of the given instructions. Patient is instructed to call or message via MyChart if he/she has any questions or concerns regarding our treatment plan. No barriers to understanding were identified. We discussed Red Flag symptoms and signs in detail. Patient expressed understanding regarding what to do in case of urgent or emergency type symptoms.   Medication list was reconciled, printed and provided to the patient in AVS. Patient  instructions and summary information was reviewed with the patient as documented in the AVS. This note was prepared with assistance of Dragon voice recognition software. Occasional wrong-word or sound-a-like substitutions may have occurred due to the inherent limitations of voice recognition software  This visit occurred during the SARS-CoV-2 public health emergency.  Safety protocols were in place, including screening questions prior to the visit, additional usage of staff PPE, and extensive cleaning of exam room while observing appropriate contact time as indicated for disinfecting solutions.

## 2021-03-02 NOTE — Patient Instructions (Addendum)
Please follow up if symptoms do not improve or as needed.    Take voltaren twice a day with food for 1-2 weeks to help with your pain.  Use the topical gel as needed.   Let me know if your knees get worse.

## 2021-03-03 ENCOUNTER — Other Ambulatory Visit: Payer: Self-pay | Admitting: Family Medicine

## 2021-03-03 ENCOUNTER — Ambulatory Visit: Payer: PPO | Admitting: Physician Assistant

## 2021-03-14 ENCOUNTER — Other Ambulatory Visit: Payer: Self-pay | Admitting: Family Medicine

## 2021-03-26 ENCOUNTER — Ambulatory Visit
Admission: RE | Admit: 2021-03-26 | Discharge: 2021-03-26 | Disposition: A | Discharge status: Home / Self Care | Payer: PPO | Source: Ambulatory Visit | Attending: Family Medicine | Admitting: Family Medicine

## 2021-03-26 ENCOUNTER — Other Ambulatory Visit: Payer: Self-pay

## 2021-03-26 DIAGNOSIS — Z780 Asymptomatic menopausal state: Secondary | ICD-10-CM | POA: Diagnosis not present

## 2021-03-26 DIAGNOSIS — E2839 Other primary ovarian failure: Secondary | ICD-10-CM

## 2021-04-07 ENCOUNTER — Encounter: Payer: Self-pay | Admitting: Family Medicine

## 2021-04-07 DIAGNOSIS — Z13820 Encounter for screening for osteoporosis: Secondary | ICD-10-CM | POA: Insufficient documentation

## 2021-04-10 DIAGNOSIS — M1712 Unilateral primary osteoarthritis, left knee: Secondary | ICD-10-CM | POA: Diagnosis not present

## 2021-04-20 ENCOUNTER — Other Ambulatory Visit: Payer: Self-pay | Admitting: Family Medicine

## 2021-05-13 ENCOUNTER — Other Ambulatory Visit: Payer: Self-pay | Admitting: Family Medicine

## 2021-05-18 ENCOUNTER — Other Ambulatory Visit: Payer: Self-pay | Admitting: Family Medicine

## 2021-05-18 NOTE — Telephone Encounter (Signed)
Last refill: 02/11/21 #30, 2 Last OV: 03/02/21 dx. Foot pain

## 2021-06-04 ENCOUNTER — Ambulatory Visit: Payer: PPO | Admitting: Physician Assistant

## 2021-06-04 ENCOUNTER — Encounter: Payer: Self-pay | Admitting: Physician Assistant

## 2021-06-04 ENCOUNTER — Ambulatory Visit: Payer: PPO | Admitting: Family Medicine

## 2021-06-04 ENCOUNTER — Other Ambulatory Visit: Payer: Self-pay

## 2021-06-04 DIAGNOSIS — B078 Other viral warts: Secondary | ICD-10-CM

## 2021-06-04 DIAGNOSIS — L570 Actinic keratosis: Secondary | ICD-10-CM | POA: Diagnosis not present

## 2021-06-04 DIAGNOSIS — Z1283 Encounter for screening for malignant neoplasm of skin: Secondary | ICD-10-CM

## 2021-06-04 DIAGNOSIS — D485 Neoplasm of uncertain behavior of skin: Secondary | ICD-10-CM

## 2021-06-04 NOTE — Patient Instructions (Signed)

## 2021-06-06 ENCOUNTER — Other Ambulatory Visit: Payer: Self-pay | Admitting: Neurology

## 2021-06-08 ENCOUNTER — Other Ambulatory Visit: Payer: Self-pay

## 2021-06-10 ENCOUNTER — Ambulatory Visit: Payer: PPO | Admitting: Family Medicine

## 2021-06-22 ENCOUNTER — Encounter: Payer: Self-pay | Admitting: Physician Assistant

## 2021-06-22 NOTE — Progress Notes (Signed)
   Follow-Up Visit   Subjective  Laura Farrell is a 75 y.o. female who presents for the following: Follow-up (Recheck keratoses right flank and legs).   The following portions of the chart were reviewed this encounter and updated as appropriate:  Tobacco  Allergies  Meds  Problems  Med Hx  Surg Hx  Fam Hx      Objective  Well appearing patient in no apparent distress; mood and affect are within normal limits.  A full examination was performed including scalp, head, eyes, ears, nose, lips, neck, chest, axillae, abdomen, back, buttocks, bilateral upper extremities, bilateral lower extremities, hands, feet, fingers, toes, fingernails, and toenails. All findings within normal limits unless otherwise noted below.  Left Lower Leg - Anterior Bichromic dark nested macule.        Chest - Medial (Center) (2), Left Malar Cheek (3), Left Upper Arm - Posterior Erythematous patches with gritty scale.      Assessment & Plan  Neoplasm of uncertain behavior of skin Left Lower Leg - Anterior  Skin / nail biopsy Type of biopsy: tangential   Informed consent: discussed and consent obtained   Timeout: patient name, date of birth, surgical site, and procedure verified   Procedure prep:  Patient was prepped and draped in usual sterile fashion (Non sterile) Prep type:  Chlorhexidine Anesthesia: the lesion was anesthetized in a standard fashion   Anesthetic:  1% lidocaine w/ epinephrine 1-100,000 local infiltration Instrument used: flexible razor blade   Outcome: patient tolerated procedure well   Post-procedure details: wound care instructions given    Specimen 1 - Surgical pathology Differential Diagnosis: bcc vs scc  Check Margins: No  AK (actinic keratosis) (6) Left Upper Arm - Posterior; Chest - Medial (Center) (2); Left Malar Cheek (3)  Destruction of lesion - Chest - Medial (Center), Left Malar Cheek, Left Upper Arm - Posterior Complexity: simple   Destruction method:  cryotherapy   Informed consent: discussed and consent obtained   Timeout:  patient name, date of birth, surgical site, and procedure verified Lesion destroyed using liquid nitrogen: Yes   Cryotherapy cycles:  3 Outcome: patient tolerated procedure well with no complications      I, Dalayna Lauter, PA-C, have reviewed all documentation's for this visit.  The documentation on 06/22/21 for the exam, diagnosis, procedures and orders are all accurate and complete.

## 2021-07-13 DIAGNOSIS — M47816 Spondylosis without myelopathy or radiculopathy, lumbar region: Secondary | ICD-10-CM | POA: Diagnosis not present

## 2021-07-16 ENCOUNTER — Other Ambulatory Visit: Payer: Self-pay | Admitting: Family Medicine

## 2021-07-23 ENCOUNTER — Ambulatory Visit: Payer: PPO | Admitting: Family Medicine

## 2021-08-06 ENCOUNTER — Ambulatory Visit (INDEPENDENT_AMBULATORY_CARE_PROVIDER_SITE_OTHER): Payer: PPO | Admitting: *Deleted

## 2021-08-06 ENCOUNTER — Other Ambulatory Visit: Payer: Self-pay

## 2021-08-06 DIAGNOSIS — Z23 Encounter for immunization: Secondary | ICD-10-CM | POA: Diagnosis not present

## 2021-08-08 ENCOUNTER — Other Ambulatory Visit: Payer: Self-pay | Admitting: Family Medicine

## 2021-09-02 ENCOUNTER — Other Ambulatory Visit: Payer: Self-pay | Admitting: Family Medicine

## 2021-09-04 ENCOUNTER — Other Ambulatory Visit: Payer: Self-pay | Admitting: Family Medicine

## 2021-09-04 ENCOUNTER — Other Ambulatory Visit: Payer: Self-pay | Admitting: Neurology

## 2021-09-04 DIAGNOSIS — G250 Essential tremor: Secondary | ICD-10-CM

## 2021-09-07 ENCOUNTER — Other Ambulatory Visit: Payer: Self-pay

## 2021-09-07 DIAGNOSIS — Z947 Corneal transplant status: Secondary | ICD-10-CM | POA: Diagnosis not present

## 2021-09-07 DIAGNOSIS — H43811 Vitreous degeneration, right eye: Secondary | ICD-10-CM | POA: Diagnosis not present

## 2021-09-07 DIAGNOSIS — Z961 Presence of intraocular lens: Secondary | ICD-10-CM | POA: Diagnosis not present

## 2021-09-23 ENCOUNTER — Ambulatory Visit: Payer: PPO | Admitting: Family Medicine

## 2021-09-24 ENCOUNTER — Ambulatory Visit: Payer: PPO | Admitting: Family Medicine

## 2021-09-29 DIAGNOSIS — M47816 Spondylosis without myelopathy or radiculopathy, lumbar region: Secondary | ICD-10-CM | POA: Diagnosis not present

## 2021-09-29 DIAGNOSIS — M546 Pain in thoracic spine: Secondary | ICD-10-CM | POA: Diagnosis not present

## 2021-09-29 DIAGNOSIS — R030 Elevated blood-pressure reading, without diagnosis of hypertension: Secondary | ICD-10-CM | POA: Diagnosis not present

## 2021-10-06 DIAGNOSIS — M549 Dorsalgia, unspecified: Secondary | ICD-10-CM | POA: Diagnosis not present

## 2021-10-06 DIAGNOSIS — M545 Low back pain, unspecified: Secondary | ICD-10-CM | POA: Diagnosis not present

## 2021-10-06 DIAGNOSIS — M4316 Spondylolisthesis, lumbar region: Secondary | ICD-10-CM | POA: Diagnosis not present

## 2021-10-06 DIAGNOSIS — M47814 Spondylosis without myelopathy or radiculopathy, thoracic region: Secondary | ICD-10-CM | POA: Diagnosis not present

## 2021-10-06 DIAGNOSIS — M546 Pain in thoracic spine: Secondary | ICD-10-CM | POA: Diagnosis not present

## 2021-10-06 DIAGNOSIS — M47816 Spondylosis without myelopathy or radiculopathy, lumbar region: Secondary | ICD-10-CM | POA: Diagnosis not present

## 2021-10-31 DIAGNOSIS — U071 COVID-19: Secondary | ICD-10-CM | POA: Diagnosis not present

## 2021-10-31 DIAGNOSIS — R051 Acute cough: Secondary | ICD-10-CM | POA: Diagnosis not present

## 2021-11-02 ENCOUNTER — Other Ambulatory Visit: Payer: Self-pay | Admitting: Family Medicine

## 2021-11-02 ENCOUNTER — Other Ambulatory Visit: Payer: Self-pay

## 2021-11-02 MED ORDER — FENOFIBRATE 54 MG PO TABS: ORAL_TABLET | ORAL | 0 refills | Status: DC

## 2021-11-02 MED ORDER — ATORVASTATIN CALCIUM 80 MG PO TABS: 80.0000 mg | ORAL_TABLET | Freq: Every day | ORAL | 0 refills | Status: DC

## 2021-11-03 ENCOUNTER — Other Ambulatory Visit: Payer: Self-pay | Admitting: Family Medicine

## 2021-11-04 ENCOUNTER — Other Ambulatory Visit: Payer: Self-pay

## 2021-11-04 ENCOUNTER — Ambulatory Visit (INDEPENDENT_AMBULATORY_CARE_PROVIDER_SITE_OTHER): Payer: PPO | Admitting: Family Medicine

## 2021-11-04 ENCOUNTER — Encounter: Payer: Self-pay | Admitting: Family Medicine

## 2021-11-04 VITALS — BP 110/58 | HR 107 | Temp 97.1°F | Ht 64.0 in | Wt 143.2 lb

## 2021-11-04 DIAGNOSIS — D126 Benign neoplasm of colon, unspecified: Secondary | ICD-10-CM

## 2021-11-04 DIAGNOSIS — E039 Hypothyroidism, unspecified: Secondary | ICD-10-CM

## 2021-11-04 DIAGNOSIS — U071 COVID-19: Secondary | ICD-10-CM | POA: Diagnosis not present

## 2021-11-04 DIAGNOSIS — G250 Essential tremor: Secondary | ICD-10-CM | POA: Diagnosis not present

## 2021-11-04 DIAGNOSIS — E6609 Other obesity due to excess calories: Secondary | ICD-10-CM

## 2021-11-04 DIAGNOSIS — E782 Mixed hyperlipidemia: Secondary | ICD-10-CM

## 2021-11-04 DIAGNOSIS — L719 Rosacea, unspecified: Secondary | ICD-10-CM | POA: Diagnosis not present

## 2021-11-04 DIAGNOSIS — N951 Menopausal and female climacteric states: Secondary | ICD-10-CM | POA: Diagnosis not present

## 2021-11-04 DIAGNOSIS — N3946 Mixed incontinence: Secondary | ICD-10-CM | POA: Diagnosis not present

## 2021-11-04 DIAGNOSIS — R7303 Prediabetes: Secondary | ICD-10-CM

## 2021-11-04 LAB — CBC WITH DIFFERENTIAL/PLATELET
Basophils Absolute: 0 K/uL (ref 0.0–0.1)
Basophils Relative: 0.3 % (ref 0.0–3.0)
Eosinophils Absolute: 0.1 K/uL (ref 0.0–0.7)
Eosinophils Relative: 2.8 % (ref 0.0–5.0)
HCT: 39.2 % (ref 36.0–46.0)
Hemoglobin: 12.8 g/dL (ref 12.0–15.0)
Lymphocytes Relative: 49.9 % — ABNORMAL HIGH (ref 12.0–46.0)
Lymphs Abs: 2.2 K/uL (ref 0.7–4.0)
MCHC: 32.8 g/dL (ref 30.0–36.0)
MCV: 91.8 fl (ref 78.0–100.0)
Monocytes Absolute: 0.2 K/uL (ref 0.1–1.0)
Monocytes Relative: 5.3 % (ref 3.0–12.0)
Neutro Abs: 1.9 K/uL (ref 1.4–7.7)
Neutrophils Relative %: 41.7 % — ABNORMAL LOW (ref 43.0–77.0)
Platelets: 162 K/uL (ref 150.0–400.0)
RBC: 4.27 Mil/uL (ref 3.87–5.11)
RDW: 14.1 % (ref 11.5–15.5)
WBC: 4.5 K/uL (ref 4.0–10.5)

## 2021-11-04 LAB — COMPREHENSIVE METABOLIC PANEL WITH GFR
ALT: 23 U/L (ref 0–35)
AST: 31 U/L (ref 0–37)
Albumin: 4.3 g/dL (ref 3.5–5.2)
Alkaline Phosphatase: 51 U/L (ref 39–117)
BUN: 22 mg/dL (ref 6–23)
CO2: 27 meq/L (ref 19–32)
Calcium: 9.6 mg/dL (ref 8.4–10.5)
Chloride: 103 meq/L (ref 96–112)
Creatinine, Ser: 1.03 mg/dL (ref 0.40–1.20)
GFR: 52.3 mL/min — ABNORMAL LOW
Glucose, Bld: 88 mg/dL (ref 70–99)
Potassium: 4.3 meq/L (ref 3.5–5.1)
Sodium: 138 meq/L (ref 135–145)
Total Bilirubin: 0.3 mg/dL (ref 0.2–1.2)
Total Protein: 7.5 g/dL (ref 6.0–8.3)

## 2021-11-04 LAB — LIPID PANEL
Cholesterol: 171 mg/dL (ref 0–200)
HDL: 45.6 mg/dL
LDL Cholesterol: 106 mg/dL — ABNORMAL HIGH (ref 0–99)
NonHDL: 125.1
Total CHOL/HDL Ratio: 4
Triglycerides: 96 mg/dL (ref 0.0–149.0)
VLDL: 19.2 mg/dL (ref 0.0–40.0)

## 2021-11-04 LAB — TSH: TSH: 4.01 u[IU]/mL (ref 0.35–5.50)

## 2021-11-04 LAB — HEMOGLOBIN A1C: Hgb A1c MFr Bld: 6.1 % (ref 4.6–6.5)

## 2021-11-04 MED ORDER — SULFACETAMIDE SODIUM-SULFUR 9.8-4.8 % EX CREA: TOPICAL_CREAM | CUTANEOUS | 5 refills | Status: DC

## 2021-11-04 NOTE — Progress Notes (Signed)
Subjective  CC:  Chief Complaint  Patient presents with   Hyperlipidemia   Hypothyroidism   Rosacea    HPI: Laura Farrell is a 76 y.o. female who presents to the office today to address the problems listed above in the chief complaint. Hyperlipidemia on Lipitor 80 mg tolerating well.  Nonfasting today for recheck. Obesity: Patient is using phentermine and has lost 30 pounds.  Feels good about her weight loss.  Would like to lose 15 more.  Working with a Clinical research associate twice a week at [employer].  Feels strong and healthy.  No adverse effects. Diagnosed with COVID-19 on January 21.  Has very mild symptoms.  Does complain of some sinus congestion and ear fullness.  No fevers.  No shortness of breath. History of prediabetes: Has had significant weight loss.  Eating healthy diet.  No symptoms of hypoglycemia. Hot flashes persist, taking gabapentin 600 nightly and sertraline. Rosacea: MetroCream does not quite help, she has persistent redness.  Would like to try something else.  No pustules Mixed urinary incontinence: Vesicare has helped the overactive bladder symptoms significantly.  Occasionally will have stress incontinence with coughing. Essential tremor stable on propranolol twice daily. History of tubular adenoma: No melena.  Assessment  1. Mixed hyperlipidemia   2. Prediabetes   3. Acquired hypothyroidism   4. Essential tremor   5. Tubular adenoma of colon   6. Menopausal hot flushes   7. Rosacea   8. Mixed stress and urge urinary incontinence   9. COVID-19   10. Exogenous obesity      Plan  Hyperlipidemia: Recheck lipids and LFTs today on Lipitor 80.  She will need a refill. Prediabetes: Likely has resolved this.  Recheck A1c today.  Continue healthy diet Obesity: Significant weight loss and doing well with exercise.  Continue phentermine for the next 3 months. Essential tremor: Continue propranolol twice daily Hot flashes: Active but not severe.  Continue gabapentin at night and  sertraline Rosacea: Trial of topical sulfa Continue Vesicare for urinary incontinence Supportive care for COVID-19.  Follow up: Return in about 3 months (around 02/02/2022) for complete physical.  Visit date not found  Orders Placed This Encounter  Procedures   CBC with Differential/Platelet   Comprehensive metabolic panel   Lipid panel   TSH   Hemoglobin A1c   Meds ordered this encounter  Medications   Sulfacetamide Sodium-Sulfur (PLEXION) 9.8-4.8 % CREA    Sig: Apply to affected areas twice daily    Dispense:  57 g    Refill:  5      I reviewed the patients updated PMH, FH, and SocHx.    Patient Active Problem List   Diagnosis Date Noted   Prediabetes 12/27/2019    Priority: High   Tubular adenoma of colon 12/27/2019    Priority: High   Family history of breast cancer in sister 08/14/2018    Priority: High   Hyperlipidemia 06/28/2012    Priority: High   Hypothyroidism 06/28/2012    Priority: High   OSA (obstructive sleep apnea) 05/24/2012    Priority: High   Essential tremor 03/20/2012    Priority: High   Osteoarthritis of left knee 04/10/2019    Priority: Medium    Chronic tension headaches 08/14/2018    Priority: Medium    Menopausal hot flushes 08/14/2018    Priority: Medium    Bilateral hydronephrosis 09/10/2013    Priority: Medium    GERD (gastroesophageal reflux disease) 06/28/2012    Priority: Medium  History of cluster headache 06/28/2012    Priority: Medium    Notalgia paresthetica 09/25/2018    Priority: Low   HLP (hyperkeratosis lenticularis perstans)     Priority: Low   Screening for osteoporosis 04/07/2021   Mixed stress and urge urinary incontinence 12/15/2020   Rosacea 06/10/2020   At high risk for breast cancer 09/13/2016   Status post corneal transplant 11/06/2009   Current Meds  Medication Sig   aspirin 81 MG tablet Take 81 mg by mouth daily.   atorvastatin (LIPITOR) 80 MG tablet Take 1 tablet (80 mg total) by mouth daily.    diclofenac (VOLTAREN) 75 MG EC tablet Take 1 tablet (75 mg total) by mouth 2 (two) times daily as needed.   diclofenac Sodium (VOLTAREN) 1 % GEL APPLY TWO GRAMS TOPICALLY FOUR TIMES DAILY   fenofibrate 54 MG tablet TAKE ONE TABLET BY MOUTH DAILY WITH a MEAL   gabapentin (NEURONTIN) 600 MG tablet Take 1 tablet (600 mg total) by mouth 2 (two) times daily. (Patient taking differently: Take 600 mg by mouth 2 (two) times daily. Takes once a day)   levothyroxine (SYNTHROID) 88 MCG tablet TAKE ONE TABLET BY MOUTH EVERY DAY   methocarbamol (ROBAXIN) 500 MG tablet Take 500 mg by mouth as needed.   omeprazole (PRILOSEC) 20 MG capsule TAKE ONE CAPSULE BY MOUTH EVERY DAY   phentermine (ADIPEX-P) 37.5 MG tablet TAKE 1/2-1 TABLET BY MOUTH DAILY AS NEEDED   prednisoLONE acetate (PRED FORTE) 1 % ophthalmic suspension PLACE 1 DROP INTO LEFT EYE DAILY   primidone (MYSOLINE) 50 MG tablet TAKE THREE TABLETS BY MOUTH EVERY MORNING AND 3 TABLETS BY MOUTH EVERY EVENING   propranolol (INDERAL) 10 MG tablet Take 1 tablet (10 mg total) by mouth 2 (two) times daily.   solifenacin (VESICARE) 5 MG tablet TAKE ONE TABLET BY MOUTH EVERY DAY   Sulfacetamide Sodium-Sulfur (PLEXION) 9.8-4.8 % CREA Apply to affected areas twice daily   traMADol (ULTRAM) 50 MG tablet Take 50 mg by mouth every 6 (six) hours as needed.   venlafaxine XR (EFFEXOR-XR) 37.5 MG 24 hr capsule TAKE ONE CAPSULE BY MOUTH EVERY DAY   [DISCONTINUED] metroNIDAZOLE (METROCREAM) 0.75 % cream Apply topically 2 (two) times daily.    Allergies: Patient is allergic to other. Family History: Patient family history includes Breast cancer (age of onset: 77) in her sister; Cancer in her father; Pancreatic cancer in her mother; Tremor in her brother, father, mother, and sister. Social History:  Patient  reports that she has never smoked. She has never used smokeless tobacco. She reports that she does not drink alcohol and does not use drugs.  Review of  Systems: Constitutional: Negative for fever malaise or anorexia Cardiovascular: negative for chest pain Respiratory: negative for SOB or persistent cough Gastrointestinal: negative for abdominal pain  Objective  Vitals: BP (!) 110/58    Pulse (!) 107    Temp (!) 97.1 F (36.2 C) (Temporal)    Ht 5\' 4"  (1.626 m)    Wt 143 lb 3.2 oz (65 kg)    SpO2 90%    BMI 24.58 kg/m  General: no acute distress , A&Ox3 HEENT: PEERL, conjunctiva normal, neck is supple, TMs bilaterally are clear Cardiovascular:  RRR without murmur or gallop.  No edema Respiratory:  Good breath sounds bilaterally, CTAB with normal respiratory effort Skin:  Warm, no rashes Neuro: Minimal tremor    Commons side effects, risks, benefits, and alternatives for medications and treatment plan prescribed today were discussed,  and the patient expressed understanding of the given instructions. Patient is instructed to call or message via MyChart if he/she has any questions or concerns regarding our treatment plan. No barriers to understanding were identified. We discussed Red Flag symptoms and signs in detail. Patient expressed understanding regarding what to do in case of urgent or emergency type symptoms.  Medication list was reconciled, printed and provided to the patient in AVS. Patient instructions and summary information was reviewed with the patient as documented in the AVS. This note was prepared with assistance of Dragon voice recognition software. Occasional wrong-word or sound-a-like substitutions may have occurred due to the inherent limitations of voice recognition software  This visit occurred during the SARS-CoV-2 public health emergency.  Safety protocols were in place, including screening questions prior to the visit, additional usage of staff PPE, and extensive cleaning of exam room while observing appropriate contact time as indicated for disinfecting solutions.

## 2021-11-04 NOTE — Patient Instructions (Addendum)
Please return in 3-6 months for your physical.   I will release your lab results to you on your MyChart account with further instructions. Please reply with any questions.    Try the new cream for your rosacea.  I will refill your lipitor after your cholesterol results return.  Please schedule your mammogram. It is due again in march 2023.  If you have any questions or concerns, please don't hesitate to send me a message via MyChart or call the office at [phone]. Thank you for visiting with [place] today! It's our pleasure caring for you.

## 2021-12-07 ENCOUNTER — Other Ambulatory Visit: Payer: Self-pay | Admitting: Neurology

## 2021-12-07 DIAGNOSIS — G250 Essential tremor: Secondary | ICD-10-CM

## 2021-12-08 ENCOUNTER — Ambulatory Visit: Payer: PPO | Admitting: Physician Assistant

## 2021-12-29 ENCOUNTER — Other Ambulatory Visit: Payer: Self-pay | Admitting: Family Medicine

## 2021-12-29 ENCOUNTER — Ambulatory Visit: Payer: PPO | Admitting: Physician Assistant

## 2021-12-29 DIAGNOSIS — Z1231 Encounter for screening mammogram for malignant neoplasm of breast: Secondary | ICD-10-CM

## 2021-12-31 ENCOUNTER — Other Ambulatory Visit: Payer: Self-pay

## 2021-12-31 ENCOUNTER — Ambulatory Visit: Payer: PPO | Admitting: Physician Assistant

## 2021-12-31 DIAGNOSIS — L820 Inflamed seborrheic keratosis: Secondary | ICD-10-CM

## 2021-12-31 DIAGNOSIS — R202 Paresthesia of skin: Secondary | ICD-10-CM

## 2021-12-31 DIAGNOSIS — L719 Rosacea, unspecified: Secondary | ICD-10-CM

## 2021-12-31 MED ORDER — MINOCYCLINE HCL 50 MG PO CAPS: 50.0000 mg | ORAL_CAPSULE | Freq: Two times a day (BID) | ORAL | 3 refills | Status: DC

## 2021-12-31 MED ORDER — METRONIDAZOLE 1 % EX GEL: Freq: Every day | CUTANEOUS | 2 refills | Status: DC

## 2022-01-05 ENCOUNTER — Encounter: Payer: Self-pay | Admitting: Family Medicine

## 2022-01-05 ENCOUNTER — Ambulatory Visit (INDEPENDENT_AMBULATORY_CARE_PROVIDER_SITE_OTHER): Payer: PPO | Admitting: Family Medicine

## 2022-01-05 VITALS — BP 120/60 | HR 68 | Temp 97.9°F | Ht 64.0 in | Wt 148.2 lb

## 2022-01-05 DIAGNOSIS — H6122 Impacted cerumen, left ear: Secondary | ICD-10-CM | POA: Diagnosis not present

## 2022-01-05 NOTE — Progress Notes (Signed)
? ?Subjective:  ? ? ? Patient ID: Laura Farrell, female    DOB: [birth year], 76 y.o.   MRN: [id number] ? ?Chief Complaint  ?Patient presents with  ? Cerumen Impaction  ?  Left ear has a lot of wax, went to audiologist this morning ?  ? ? ?HPI ?Went to audiologist this am-told L ear occluded w/wax.  Has had removed here in past.   Not have hearing aids yet.  ? ?Health Maintenance Due  ?Topic Date Due  ? COVID-19 Vaccine (4 - Booster for Pfizer series) 09/14/2020  ? ? ?Past Medical History:  ?Diagnosis Date  ? Chronic tension headaches 08/14/2018  ? Diabetes mellitus   ? Headache(784.0)   ? HLP (hyperkeratosis lenticularis perstans)   ? Hot flashes   ? Hyperlipidemia   ? Menopausal hot flushes 08/14/2018  ? On effexor and gabapentin  ? Nephrolithiasis   ? Over weight   ? Thyroid disease   ? Tubular adenoma of colon 12/27/2019  ? Colonoscopy, Dr. Cristina Gong, 02/2015: tubular adenoma, 5 year recall  ? Urine incontinence   ? ? ?Past Surgical History:  ?Procedure Laterality Date  ? ABDOMINAL HYSTERECTOMY    ? APPENDECTOMY    ? BREAST EXCISIONAL BIOPSY Left   ? BREAST EXCISIONAL BIOPSY Left   ? BREAST EXCISIONAL BIOPSY Left   ? CATARACT EXTRACTION Right 2017  ? cornea implant    ? left shoulder    ? lower back surgery    ? SPINE SURGERY  2005  ? tubal ligation  1980  ? VESICOVAGINAL FISTULA CLOSURE W/ TAH  1995  ? ? ?Outpatient Medications Prior to Visit  ?Medication Sig Dispense Refill  ? aspirin 81 MG tablet Take 81 mg by mouth daily.    ? atorvastatin (LIPITOR) 80 MG tablet Take 1 tablet (80 mg total) by mouth daily. 30 tablet 0  ? citalopram (CELEXA) 20 MG tablet 1 tablet (20 mg total) every evening.    ? diclofenac (VOLTAREN) 75 MG EC tablet Take 1 tablet (75 mg total) by mouth 2 (two) times daily as needed. 60 tablet 2  ? diclofenac Sodium (VOLTAREN) 1 % GEL APPLY TWO GRAMS TOPICALLY FOUR TIMES DAILY 400 g 3  ? fenofibrate 54 MG tablet TAKE ONE TABLET BY MOUTH DAILY WITH a MEAL 30 tablet 0  ? fenofibrate 54 MG tablet 1  tablet (54 mg total) every evening.    ? gabapentin (NEURONTIN) 600 MG tablet Take 1 tablet (600 mg total) by mouth 2 (two) times daily. (Patient taking differently: Take 600 mg by mouth 2 (two) times daily. Takes once a day) 180 tablet 3  ? LAGEVRIO 200 MG CAPS capsule SMARTSIG:4 Capsule(s) By Mouth Every 12 Hours    ? levothyroxine (SYNTHROID) 88 MCG tablet TAKE ONE TABLET BY MOUTH EVERY DAY 90 tablet 3  ? methocarbamol (ROBAXIN) 500 MG tablet Take 500 mg by mouth as needed.    ? metroNIDAZOLE (METROGEL) 1 % gel Apply topically daily. 45 g 2  ? minocycline (MINOCIN) 50 MG capsule Take 1 capsule (50 mg total) by mouth 2 (two) times daily. 180 capsule 3  ? omeprazole (PRILOSEC) 20 MG capsule TAKE ONE CAPSULE BY MOUTH EVERY DAY 90 capsule 3  ? phentermine (ADIPEX-P) 37.5 MG tablet TAKE 1/2-1 TABLET BY MOUTH DAILY AS NEEDED 30 tablet 2  ? prednisoLONE acetate (PRED FORTE) 1 % ophthalmic suspension PLACE 1 DROP INTO LEFT EYE DAILY    ? primidone (MYSOLINE) 50 MG tablet TAKE THREE TABLETS BY  MOUTH EVERY MORNING AND 3 TABLETS BY MOUTH EVERY EVENING 540 tablet 0  ? primidone (MYSOLINE) 50 MG tablet 1 tablet (50 mg total) every morning. And 3 at bedtime    ? propranolol (INDERAL) 10 MG tablet Take 1 tablet (10 mg total) by mouth 2 (two) times daily. 180 tablet 1  ? rosuvastatin (CRESTOR) 40 MG tablet One by mouth at bedtime.    ? solifenacin (VESICARE) 5 MG tablet TAKE ONE TABLET BY MOUTH EVERY DAY 90 tablet 3  ? Sulfacetamide Sodium-Sulfur (PLEXION) 9.8-4.8 % CREA Apply to affected areas twice daily 57 g 5  ? traMADol (ULTRAM) 50 MG tablet Take 50 mg by mouth every 6 (six) hours as needed.    ? venlafaxine XR (EFFEXOR-XR) 37.5 MG 24 hr capsule TAKE ONE CAPSULE BY MOUTH EVERY DAY 90 capsule 3  ? ?No facility-administered medications prior to visit.  ? ? ?Allergies  ?Allergen Reactions  ? Other Other (See Comments)  ?  Liquid medication  ? ?ROS neg/noncontributory except as noted HPI/below ? ? ?   ?Objective:  ?  ? ?BP  120/60   Pulse 68   Temp 97.9 ?F (36.6 ?C) (Temporal)   Ht '5\' 4"'$  (1.626 m)   Wt 148 lb 4 oz (67.2 kg)   SpO2 99%   BMI 25.45 kg/m?  ?Wt Readings from Last 3 Encounters:  ?01/05/22 148 lb 4 oz (67.2 kg)  ?11/04/21 143 lb 3.2 oz (65 kg)  ?03/02/21 162 lb 9.6 oz (73.8 kg)  ? ? ?Physical Exam  ? ?Gen: WDWN NAD WF ?HEENT: NCAT, conjunctiva not injected, sclera nonicteric ?TM WNL B but initially, L occluded 80% w/wax=informed consent obtained.  Irrigated w/water and peroxide and used scoop to retrieve 2 larger pieces.  Pt tolerated well. ?EXT:  no edema ?MSK: no gross abnormalities.  ?NEURO: A&O x3.  CN II-XII intact.  ?PSYCH: normal mood. Good eye contact ? ?   ?Assessment & Plan:  ? ?Problem List Items Addressed This Visit   ?None ?Visit Diagnoses   ? ? Excessive ear wax, left    -  Primary  ? ?  ? Was L ear-irrig and used scoop to remove.  TM normal.  Can f/u audiology ? ?No orders of the defined types were placed in this encounter. ? ? ?Wellington Hampshire, MD ? ?

## 2022-01-05 NOTE — Patient Instructions (Signed)
We got it all out!!!!   Hopefully, helps ?

## 2022-01-08 NOTE — Progress Notes (Signed)
? ? ?Assessment/Plan:  ? ? ?1.  Tremor ? -Her tremor has been difficult, as objective tremor findings have been incongruent with subjective complaints.  This continues today although I did tell her she could try to take propranolol tid last visit and she didn't.  I didn't ask her to do that today because BP and pulse are fairly low. ? -Continue primidone, 50 mg, 4 in the AM, 2 in the PM ? -Continue propranolol, 10 mg 2 times per day.  She asked about increasing this but I don't want to do that due to LBP ? -don't want to start topamax d/t remote hx of nephrolithiasis ? -told her options limited.  She and I discussed and decided to continue current med. Not dbs candidate d/t very mild sx's.   ? ? ?Subjective:  ? ?Laura Farrell was seen today in follow up for tremor.  My previous records were reviewed prior to todays visit.  Med records are reviewed since last visit, including from her primary care visit in January.  Patient has had no falls.  No near syncope.  She did have COVID at end of January.  She has laryngitis now.  She was just put on abx yesterday.  States that tremor may be getting worse but not better.  She states that tremor has better and worse days.  "Its good today." ? ?Current prescribed movement disorder medications: ?Propranolol, 10 mg twice per day ?Primidone, 50 mg, 2 in the morning 4 at night (tried it this way to see if it would help the morning tremor without making her so sleepy but she reports she is taking 4 in the AM and 2 at night) ?(On gabapentin 600 mg daily further issues ? ?ALLERGIES:   ?Allergies  ?Allergen Reactions  ? Other Other (See Comments)  ?  Liquid medication  ? ? ?CURRENT MEDICATIONS:  ?Outpatient Encounter Medications as of 01/12/2022  ?Medication Sig  ? amoxicillin (AMOXIL) 875 MG tablet Take 1 tablet (875 mg total) by mouth 2 (two) times daily.  ? aspirin 81 MG tablet Take 81 mg by mouth daily.  ? atorvastatin (LIPITOR) 80 MG tablet Take 1 tablet (80 mg total) by mouth  daily.  ? benzonatate (TESSALON) 200 MG capsule Take 1 capsule (200 mg total) by mouth 2 (two) times daily as needed for cough.  ? citalopram (CELEXA) 20 MG tablet 1 tablet (20 mg total) every evening.  ? diclofenac (VOLTAREN) 75 MG EC tablet Take 1 tablet (75 mg total) by mouth 2 (two) times daily as needed.  ? diclofenac Sodium (VOLTAREN) 1 % GEL APPLY TWO GRAMS TOPICALLY FOUR TIMES DAILY  ? fenofibrate 54 MG tablet TAKE ONE TABLET BY MOUTH DAILY WITH a MEAL  ? fenofibrate 54 MG tablet 1 tablet (54 mg total) every evening.  ? gabapentin (NEURONTIN) 600 MG tablet Take 1 tablet (600 mg total) by mouth 2 (two) times daily. (Patient taking differently: Take 600 mg by mouth 2 (two) times daily. Takes once a day)  ? LAGEVRIO 200 MG CAPS capsule SMARTSIG:4 Capsule(s) By Mouth Every 12 Hours  ? levothyroxine (SYNTHROID) 88 MCG tablet TAKE ONE TABLET BY MOUTH EVERY DAY  ? methocarbamol (ROBAXIN) 500 MG tablet Take 500 mg by mouth as needed.  ? metroNIDAZOLE (METROGEL) 1 % gel Apply topically daily.  ? minocycline (MINOCIN) 50 MG capsule Take 1 capsule (50 mg total) by mouth 2 (two) times daily.  ? omeprazole (PRILOSEC) 20 MG capsule TAKE ONE CAPSULE BY MOUTH EVERY DAY  ?  phentermine (ADIPEX-P) 37.5 MG tablet TAKE 1/2-1 TABLET BY MOUTH DAILY AS NEEDED  ? prednisoLONE acetate (PRED FORTE) 1 % ophthalmic suspension PLACE 1 DROP INTO LEFT EYE DAILY  ? primidone (MYSOLINE) 50 MG tablet TAKE THREE TABLETS BY MOUTH EVERY MORNING AND 3 TABLETS BY MOUTH EVERY EVENING  ? primidone (MYSOLINE) 50 MG tablet 1 tablet (50 mg total) every morning. And 3 at bedtime  ? propranolol (INDERAL) 10 MG tablet Take 1 tablet (10 mg total) by mouth 2 (two) times daily.  ? rosuvastatin (CRESTOR) 40 MG tablet One by mouth at bedtime.  ? solifenacin (VESICARE) 5 MG tablet TAKE ONE TABLET BY MOUTH EVERY DAY  ? Sulfacetamide Sodium-Sulfur (PLEXION) 9.8-4.8 % CREA Apply to affected areas twice daily  ? traMADol (ULTRAM) 50 MG tablet Take 50 mg by mouth  every 6 (six) hours as needed.  ? venlafaxine XR (EFFEXOR-XR) 37.5 MG 24 hr capsule TAKE ONE CAPSULE BY MOUTH EVERY DAY  ? ?No facility-administered encounter medications on file as of 01/12/2022.  ? ? ? ?Objective:  ? ? ?PHYSICAL EXAMINATION:   ? ?VITALS:   ?Vitals:  ? 01/12/22 0804  ?BP: 129/76  ?Pulse: 78  ?SpO2: 99%  ?Weight: 149 lb (67.6 kg)  ?Height: '5\' 4"'$  (1.626 m)  ? ? ? ?GEN:  The patient appears stated age and is in NAD. ?HEENT:  Normocephalic, atraumatic.  The mucous membranes are moist.  ? ?Neurological examination: ? ?Orientation: The patient is alert and oriented x3. ?Cranial nerves: There is good facial symmetry. The speech is fluent and clear (she has little voice b/c of laryngitis). Soft palate rises symmetrically and there is no tongue deviation. Hearing is intact to conversational tone. ?Sensation: Sensation is intact to light touch throughout ?Motor: Strength is at least antigravity x4. ? ?Movement examination: ?Tone: There is normal tone in the UE/LE ?Abnormal movements: she has no rest tremor.  No postural or intention tremor ?Coordination:  There is no decremation with RAM's, ?Gait and Station: The patient has no difficulty arising out of a deep-seated chair without the use of the hands. The patient's stride length is good ?I have reviewed and interpreted the following labs independently ?  Chemistry   ?   ?Component Value Date/Time  ? NA 138 11/04/2021 1220  ? NA 141 09/13/2016 0803  ? K 4.3 11/04/2021 1220  ? K 4.4 09/13/2016 0803  ? CL 103 11/04/2021 1220  ? CO2 27 11/04/2021 1220  ? CO2 23 09/13/2016 0803  ? BUN 22 11/04/2021 1220  ? BUN 15.8 09/13/2016 0803  ? CREATININE 1.03 11/04/2021 1220  ? CREATININE 0.9 09/13/2016 0803  ?    ?Component Value Date/Time  ? CALCIUM 9.6 11/04/2021 1220  ? CALCIUM 9.8 09/13/2016 0803  ? ALKPHOS 51 11/04/2021 1220  ? ALKPHOS 73 09/13/2016 0803  ? AST 31 11/04/2021 1220  ? AST 20 09/13/2016 0803  ? ALT 23 11/04/2021 1220  ? ALT 19 09/13/2016 0803  ? BILITOT  0.3 11/04/2021 1220  ? BILITOT 0.25 09/13/2016 0803  ?  ? ?Lab Results  ?Component Value Date  ? TSH 4.01 11/04/2021  ? ? ? ?Cc:  Leamon Arnt, MD ? ?

## 2022-01-11 ENCOUNTER — Encounter: Payer: Self-pay | Admitting: Family Medicine

## 2022-01-11 ENCOUNTER — Ambulatory Visit (INDEPENDENT_AMBULATORY_CARE_PROVIDER_SITE_OTHER): Payer: PPO | Admitting: Family Medicine

## 2022-01-11 VITALS — BP 143/80 | HR 60 | Temp 97.7°F | Ht 64.0 in | Wt 147.6 lb

## 2022-01-11 DIAGNOSIS — H9209 Otalgia, unspecified ear: Secondary | ICD-10-CM | POA: Diagnosis not present

## 2022-01-11 MED ORDER — BENZONATATE 200 MG PO CAPS: 200.0000 mg | ORAL_CAPSULE | Freq: Two times a day (BID) | ORAL | 0 refills | Status: DC | PRN

## 2022-01-11 MED ORDER — AMOXICILLIN 875 MG PO TABS: 875.0000 mg | ORAL_TABLET | Freq: Two times a day (BID) | ORAL | 0 refills | Status: DC

## 2022-01-11 NOTE — Progress Notes (Signed)
? ?  Laura Farrell is a 76 y.o. female who presents today for an office visit. ? ?Assessment/Plan:  ?Ear Pain ?Consistent with otitis media.  We will start amoxicillin.  Also start Tessalon for cough.  She can use over-the-counter meds as needed for pain.  Recommended good hydration.  She will let me know if not improving in the next couple of days. ? ?  ?Subjective:  ?HPI: ? ?Patient here with ear pain.  She started having symptoms a couple days ago with nausea and vomiting.  Occasional cough.  She woke up this morning with severe pain in her left ear.  She is also had a raspy voice.  She does not feel much nasal drainage.  She has not tried any treatments.  No known sick contacts.  She has not had any recurrence of nausea or vomiting last couple of days.  No diarrhea.  No abdominal pain.  No reported fevers or chills. ? ? ?   ?  ?Objective:  ?Physical Exam: ?BP (!) 143/80 (BP Location: Left Arm)   Pulse 60   Temp 97.7 ?F (36.5 ?C) (Temporal)   Ht '5\' 4"'$  (1.626 m)   Wt 147 lb 9.6 oz (67 kg)   SpO2 93%   BMI 25.34 kg/m?   ?Gen: No acute distress, resting comfortably ?HEENT: Right TM with clear effusion.  Left TM erythematous and bulging with effusion. ?CV: Regular rate and rhythm with no murmurs appreciated ?Pulm: Normal work of breathing, clear to auscultation bilaterally with no crackles, wheezes, or rhonchi ?Neuro: Grossly normal, moves all extremities ?Psych: Normal affect and thought content ? ?   ? ?Algis Greenhouse. Jerline Pain, MD ?01/11/2022 10:37 AM  ?

## 2022-01-11 NOTE — Patient Instructions (Signed)
It was very nice to see you today! ? ?You have an ear infection.  Please start amoxicillin.  Use Tessalon as needed for cough.  Make sure you get plenty of fluids.  You can take ibuprofen and Tylenol as needed.  Let us know if not improving in the next few days. ? ?Take care, ?Dr Jerline Pain ? ?PLEASE NOTE: ? ?If you had any lab tests please let us know if you have not heard back within a few days. You may see your results on mychart before we have a chance to review them but we will give you a call once they are reviewed by [place]. If we ordered any referrals today, please let us know if you have not heard from their office within the next week.  ? ?Please try these tips to maintain a healthy lifestyle: ? ?Eat at least 3 REAL meals and 1-2 snacks per day.  Aim for no more than 5 hours between eating.  If you eat breakfast, please do so within one hour of getting up.  ? ?Each meal should contain half fruits/vegetables, one quarter protein, and one quarter carbs (no bigger than a computer mouse) ? ?Cut down on sweet beverages. This includes juice, soda, and sweet tea.  ? ?Drink at least 1 glass of water with each meal and aim for at least 8 glasses per day ? ?Exercise at least 150 minutes every week.   ?

## 2022-01-12 ENCOUNTER — Encounter: Payer: Self-pay | Admitting: Neurology

## 2022-01-12 ENCOUNTER — Ambulatory Visit: Payer: PPO | Admitting: Neurology

## 2022-01-12 DIAGNOSIS — G250 Essential tremor: Secondary | ICD-10-CM

## 2022-01-12 MED ORDER — PRIMIDONE 50 MG PO TABS: ORAL_TABLET | ORAL | 2 refills | Status: DC

## 2022-01-12 MED ORDER — PROPRANOLOL HCL 10 MG PO TABS: 10.0000 mg | ORAL_TABLET | Freq: Two times a day (BID) | ORAL | 2 refills | Status: DC

## 2022-01-13 ENCOUNTER — Ambulatory Visit
Admission: RE | Admit: 2022-01-13 | Discharge: 2022-01-13 | Disposition: A | Discharge status: Home / Self Care | Payer: PPO | Source: Ambulatory Visit | Attending: Family Medicine | Admitting: Family Medicine

## 2022-01-13 ENCOUNTER — Ambulatory Visit (INDEPENDENT_AMBULATORY_CARE_PROVIDER_SITE_OTHER): Payer: PPO | Admitting: Family Medicine

## 2022-01-13 ENCOUNTER — Encounter: Payer: Self-pay | Admitting: Family Medicine

## 2022-01-13 VITALS — BP 114/78 | HR 66 | Temp 98.1°F | Ht 64.0 in | Wt 148.6 lb

## 2022-01-13 DIAGNOSIS — Z1231 Encounter for screening mammogram for malignant neoplasm of breast: Secondary | ICD-10-CM

## 2022-01-13 DIAGNOSIS — H9209 Otalgia, unspecified ear: Secondary | ICD-10-CM | POA: Diagnosis not present

## 2022-01-13 MED ORDER — PREDNISONE 50 MG PO TABS: ORAL_TABLET | ORAL | 0 refills | Status: DC

## 2022-01-13 MED ORDER — AMOXICILLIN-POT CLAVULANATE 875-125 MG PO TABS: 1.0000 | ORAL_TABLET | Freq: Two times a day (BID) | ORAL | 0 refills | Status: DC

## 2022-01-13 NOTE — Patient Instructions (Signed)
It was very nice to see you today! ? ?Please start the Augmentin and prednisone.  Let me know if not improving by next week. ? ?Take care, ?Dr Jerline Pain ? ?PLEASE NOTE: ? ?If you had any lab tests please let us know if you have not heard back within a few days. You may see your results on mychart before we have a chance to review them but we will give you a call once they are reviewed by [place]. If we ordered any referrals today, please let us know if you have not heard from their office within the next week.  ? ?Please try these tips to maintain a healthy lifestyle: ? ?Eat at least 3 REAL meals and 1-2 snacks per day.  Aim for no more than 5 hours between eating.  If you eat breakfast, please do so within one hour of getting up.  ? ?Each meal should contain half fruits/vegetables, one quarter protein, and one quarter carbs (no bigger than a computer mouse) ? ?Cut down on sweet beverages. This includes juice, soda, and sweet tea.  ? ?Drink at least 1 glass of water with each meal and aim for at least 8 glasses per day ? ?Exercise at least 150 minutes every week.   ?

## 2022-01-13 NOTE — Progress Notes (Signed)
? ?  Laura Farrell is a 76 y.o. female who presents today for an office visit. ? ?Assessment/Plan:  ?Otitis Media ?She has not had good response with amoxicillin.  Given her degree of pain as well as hearing loss we will switch antibiotic to Augmentin.  We will also start prednisone burst.  She will let me know if not improving and we can consider trial of Levaquin versus referral to ENT. ? ?  ?Subjective:  ?HPI: ? ?Patient here for routine follow-up.  We saw her a few days ago.  Diagnosed with otitis media in her right ear.  She was started on amoxicillin.  She has been compliant with medication.  Pain is worsened.  She cannot hear out of her right ear.  No fevers or chills.  She still has a raspy voice but this seems to be at baseline. ? ?   ?  ?Objective:  ?Physical Exam: ?BP 114/78 (BP Location: Left Arm)   Pulse 66   Temp 98.1 ?F (36.7 ?C) (Temporal)   Ht '5\' 4"'$  (1.626 m)   Wt 148 lb 9.6 oz (67.4 kg)   SpO2 100%   BMI 25.51 kg/m?   ?Gen: No acute distress, resting comfortably ?HEENT: Right TM bulging and erythematous.  Left TM erythematous with effusion. ?CV: Regular rate and rhythm with no murmurs appreciated ?Pulm: Normal work of breathing, clear to auscultation bilaterally with no crackles, wheezes, or rhonchi ?Neuro: Grossly normal, moves all extremities ?Psych: Normal affect and thought content ? ?   ? ?Algis Greenhouse. Jerline Pain, MD ?01/13/2022 11:33 AM  ?

## 2022-01-18 ENCOUNTER — Ambulatory Visit (INDEPENDENT_AMBULATORY_CARE_PROVIDER_SITE_OTHER): Payer: PPO | Admitting: Family Medicine

## 2022-01-18 ENCOUNTER — Encounter: Payer: Self-pay | Admitting: Family Medicine

## 2022-01-18 VITALS — BP 114/75 | HR 68 | Temp 97.3°F | Ht 64.0 in | Wt 151.6 lb

## 2022-01-18 DIAGNOSIS — H65191 Other acute nonsuppurative otitis media, right ear: Secondary | ICD-10-CM

## 2022-01-18 MED ORDER — BENZONATATE 200 MG PO CAPS
200.0000 mg | ORAL_CAPSULE | Freq: Two times a day (BID) | ORAL | 0 refills | Status: DC | PRN
Start: 2022-01-18 — End: 2022-03-11

## 2022-01-18 NOTE — Progress Notes (Addendum)
? ?  Laura Farrell is a 76 y.o. female who presents today for an office visit. ? ?Assessment/Plan:  ?Middle Ear Effusion ?Infection seems to have mostly resolved though she still has persistent effusion causing symptoms including hearing loss.  She may have some element of eustachian tube dysfunction.  We have tried antibiotics and prednisone without much improvement in her effusion or hearing.  We will refer to ENT for further evaluation and management.  We discussed reasons to return to care. ? ?  ?Subjective:  ?HPI: ? ?Patient here for otalgia follow-up.  We have seen her twice in the last week with right-sided ear pain and hearing loss.  We tried amoxicillin and then switched to Augmentin.  We gave her a 5-day prednisone burst 5 days ago.  Her other symptoms including hoarse voice and cough seem to be improving though she still has persistent sensation of fullness in her right ear.  She cannot hear out of that ear.  Pain has improved. ? ?   ?  ?Objective:  ?Physical Exam: ?BP 114/75 (BP Location: Left Arm)   Pulse 68 Comment: manually  Temp (!) 97.3 ?F (36.3 ?C) (Temporal)   Ht '5\' 4"'$  (1.626 m)   Wt 151 lb 9.6 oz (68.8 kg)   BMI 26.02 kg/m?   ?Gen: No acute distress, resting comfortably ?HEENT: Right TM erythematous with effusion.  Left TM clear. ?CV: Regular rate and rhythm with no murmurs appreciated ?Pulm: Normal work of breathing, clear to auscultation bilaterally with no crackles, wheezes, or rhonchi ?Neuro: Grossly normal, moves all extremities ?Psych: Normal affect and thought content ? ?   ? ?Algis Greenhouse. Jerline Pain, MD ?01/18/2022 3:44 PM  ?

## 2022-01-18 NOTE — Patient Instructions (Signed)
It was very nice to see you today! ? ?We will refer you to see an ear doctor. ? ?I will refill your cough medication.  Please continue taking the nasal spray. ? ?Take care, ?Dr Jerline Pain ? ?PLEASE NOTE: ? ?If you had any lab tests please let us know if you have not heard back within a few days. You may see your results on mychart before we have a chance to review them but we will give you a call once they are reviewed by [place]. If we ordered any referrals today, please let us know if you have not heard from their office within the next week.  ? ?Please try these tips to maintain a healthy lifestyle: ? ?Eat at least 3 REAL meals and 1-2 snacks per day.  Aim for no more than 5 hours between eating.  If you eat breakfast, please do so within one hour of getting up.  ? ?Each meal should contain half fruits/vegetables, one quarter protein, and one quarter carbs (no bigger than a computer mouse) ? ?Cut down on sweet beverages. This includes juice, soda, and sweet tea.  ? ?Drink at least 1 glass of water with each meal and aim for at least 8 glasses per day ? ?Exercise at least 150 minutes every week.   ?

## 2022-01-19 ENCOUNTER — Telehealth: Payer: Self-pay | Admitting: Family Medicine

## 2022-01-19 NOTE — Telephone Encounter (Signed)
Patient was contacted and advised Dr Marcelline Deist address and contact info, pt advised she can reach out to them to schedule.  ?

## 2022-01-19 NOTE — Telephone Encounter (Signed)
Pt called inquiring about her referral for ENT. Referral was placed yesterday as urgent. I also sent a msg to [employer]. ?

## 2022-01-19 NOTE — Telephone Encounter (Signed)
Pt states Dr Meredith Staggers office informed her they only take referrals from the hospital. They suggested [facility] or Dr Benjamine Mola. Sending this msg to Lattie Haw also ?

## 2022-01-21 ENCOUNTER — Encounter: Payer: Self-pay | Admitting: Physician Assistant

## 2022-01-21 NOTE — Progress Notes (Signed)
? ?  Follow-Up Visit ?  ?Subjective  ?Laura Farrell is a 76 y.o. female who presents for the following: Skin Problem (Patient has a list of questions. She wants a stronger does of metronidazole for rosacea. And new prescription for minocycline. ). ? ? ?The following portions of the chart were reviewed this encounter and updated as appropriate:  Tobacco  Allergies  Meds  Problems  Med Hx  Surg Hx  Fam Hx   ?  ? ?Objective  ?Well appearing patient in no apparent distress; mood and affect are within normal limits. ? ?A full examination was performed including scalp, head, eyes, ears, nose, lips, neck, chest, axillae, abdomen, back, buttocks, bilateral upper extremities, bilateral lower extremities, hands, feet, fingers, toes, fingernails, and toenails. All findings within normal limits unless otherwise noted below. ? ?Left Thigh - Anterior ?Stuck-on, crusted plaques on an erythematous base. .  ? ?Mid Back ?Pruritis without skin presentation.  ? ?Head - Anterior (Face) ?Erythema with few scattered pustules.  ? ? ?Assessment & Plan  ?Inflamed seborrheic keratosis ?Left Thigh - Anterior ? ?Destruction of lesion - Left Thigh - Anterior ?Complexity: simple   ?Destruction method: cryotherapy   ?Informed consent: discussed and consent obtained   ?Timeout:  patient name, date of birth, surgical site, and procedure verified ?Lesion destroyed using liquid nitrogen: Yes   ?Cryotherapy cycles:  1 ?Outcome: patient tolerated procedure well with no complications   ?Post-procedure details: wound care instructions given   ? ?Notalgia paresthetica ?Mid Back ? ?No treatment. ? ?Rosacea ?Head - Anterior (Face) ? ?metroNIDAZOLE (METROGEL) 1 % gel - Head - Anterior (Face) ?Apply topically daily. ? ?minocycline (MINOCIN) 50 MG capsule - Head - Anterior (Face) ?Take 1 capsule (50 mg total) by mouth 2 (two) times daily. ? ? ? ?I, Kyaira Trantham, PA-C, have reviewed all documentation's for this visit.  The documentation on 01/21/22  for the exam, diagnosis, procedures and orders are all accurate and complete. ?

## 2022-01-25 DIAGNOSIS — H6981 Other specified disorders of Eustachian tube, right ear: Secondary | ICD-10-CM | POA: Diagnosis not present

## 2022-01-25 DIAGNOSIS — H6501 Acute serous otitis media, right ear: Secondary | ICD-10-CM | POA: Diagnosis not present

## 2022-01-29 ENCOUNTER — Other Ambulatory Visit: Payer: Self-pay | Admitting: Family Medicine

## 2022-02-10 DIAGNOSIS — Z9889 Other specified postprocedural states: Secondary | ICD-10-CM | POA: Diagnosis not present

## 2022-02-10 DIAGNOSIS — Z961 Presence of intraocular lens: Secondary | ICD-10-CM | POA: Diagnosis not present

## 2022-02-10 DIAGNOSIS — H43811 Vitreous degeneration, right eye: Secondary | ICD-10-CM | POA: Diagnosis not present

## 2022-03-02 ENCOUNTER — Other Ambulatory Visit: Payer: Self-pay | Admitting: Family Medicine

## 2022-03-03 DIAGNOSIS — M545 Low back pain, unspecified: Secondary | ICD-10-CM | POA: Diagnosis not present

## 2022-03-04 ENCOUNTER — Other Ambulatory Visit: Payer: Self-pay | Admitting: Family Medicine

## 2022-03-11 ENCOUNTER — Ambulatory Visit (INDEPENDENT_AMBULATORY_CARE_PROVIDER_SITE_OTHER): Payer: PPO | Admitting: Family Medicine

## 2022-03-11 ENCOUNTER — Encounter: Payer: Self-pay | Admitting: Family Medicine

## 2022-03-11 VITALS — BP 120/64 | HR 59 | Temp 97.6°F | Ht 64.0 in | Wt 154.6 lb

## 2022-03-11 DIAGNOSIS — H6983 Other specified disorders of Eustachian tube, bilateral: Secondary | ICD-10-CM | POA: Diagnosis not present

## 2022-03-11 DIAGNOSIS — E782 Mixed hyperlipidemia: Secondary | ICD-10-CM

## 2022-03-11 DIAGNOSIS — E6609 Other obesity due to excess calories: Secondary | ICD-10-CM | POA: Diagnosis not present

## 2022-03-11 MED ORDER — ATORVASTATIN CALCIUM 80 MG PO TABS: 80.0000 mg | ORAL_TABLET | Freq: Every day | ORAL | 3 refills | Status: DC

## 2022-03-11 MED ORDER — FENOFIBRATE 54 MG PO TABS: 54.0000 mg | ORAL_TABLET | Freq: Every day | ORAL | 3 refills | Status: DC

## 2022-03-11 NOTE — Progress Notes (Signed)
Subjective  CC:  Chief Complaint  Patient presents with   Medication Refill    Pt is here to have a couple of medications refilled and look in her Rt ear bc it seems to to be stopped up    HPI: Laura Farrell is a 76 y.o. female who presents to the office today to address the problems listed above in the chief complaint. HLD: needs fenofibrate and statin refilled. Tolerating well. Diet is fair. Weight is stable. Off phentermine. 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 Feels well.   ETD: reviewed recent notes. Has seen ENT; hearing is improved but still feels "off". On flonase. No pain. No tinnitus.  Reviewed med list and updated it.   Wt Readings from Last 3 Encounters:  03/11/22 154 lb 9.6 oz (70.1 kg)  01/18/22 151 lb 9.6 oz (68.8 kg)  01/13/22 148 lb 9.6 oz (67.4 kg)    Assessment  1. Mixed hyperlipidemia   2. ETD (Eustachian tube dysfunction), bilateral   3. Exogenous obesity      Plan  HLD:  controlled; refilled fenofibrate 54 daily and atorvastatin 80. Will recheck fasting lipids at upcoming cpe in august. ETD: retracted membrane now: rec valsalva, flonase and f/u with ent as scheduled. Obesity; stable. Continue diet and exercise.   Follow up: cpe  06/08/2022  No orders of the defined types were placed in this encounter.  Meds ordered this encounter  Medications   atorvastatin (LIPITOR) 80 MG tablet    Sig: Take 1 tablet (80 mg total) by mouth daily.    Dispense:  90 tablet    Refill:  3   fenofibrate 54 MG tablet    Sig: Take 1 tablet (54 mg total) by mouth at bedtime.    Dispense:  90 tablet    Refill:  3      I reviewed the patients updated PMH, FH, and SocHx.    Patient Active Problem List   Diagnosis Date Noted   Prediabetes 12/27/2019    Priority: High   Tubular adenoma of colon 12/27/2019    Priority: High   Family history of breast cancer in sister 08/14/2018    Priority: High   Hyperlipidemia 06/28/2012    Priority: High    Hypothyroidism 06/28/2012    Priority: High   OSA (obstructive sleep apnea) 05/24/2012    Priority: High   Essential tremor 03/20/2012    Priority: High   Osteoarthritis of left knee 04/10/2019    Priority: Medium    Chronic tension headaches 08/14/2018    Priority: Medium    Menopausal hot flushes 08/14/2018    Priority: Medium    Bilateral hydronephrosis 09/10/2013    Priority: Medium    GERD (gastroesophageal reflux disease) 06/28/2012    Priority: Medium    History of cluster headache 06/28/2012    Priority: Medium    Notalgia paresthetica 09/25/2018    Priority: Low   HLP (hyperkeratosis lenticularis perstans)     Priority: Low   Screening for osteoporosis 04/07/2021   Mixed stress and urge urinary incontinence 12/15/2020   Rosacea 06/10/2020   At high risk for breast cancer 09/13/2016   Status post corneal transplant 11/06/2009   Current Meds  Medication Sig   aspirin 81 MG tablet Take 81 mg by mouth daily.   diclofenac (VOLTAREN) 75 MG EC tablet Take 1 tablet (75 mg total) by mouth 2 (two) times daily as needed.   diclofenac Sodium (VOLTAREN) 1 % GEL APPLY TWO GRAMS  TOPICALLY FOUR TIMES DAILY   gabapentin (NEURONTIN) 600 MG tablet TAKE ONE TABLET BY MOUTH TWICE DAILY   levothyroxine (SYNTHROID) 88 MCG tablet TAKE ONE TABLET BY MOUTH EVERY DAY   metroNIDAZOLE (METROGEL) 1 % gel Apply topically daily.   minocycline (MINOCIN) 50 MG capsule Take 1 capsule (50 mg total) by mouth 2 (two) times daily.   omeprazole (PRILOSEC) 20 MG capsule TAKE ONE CAPSULE BY MOUTH EVERY DAY   prednisoLONE acetate (PRED FORTE) 1 % ophthalmic suspension PLACE 1 DROP INTO LEFT EYE DAILY   primidone (MYSOLINE) 50 MG tablet 4 in the AM, 2 at night   propranolol (INDERAL) 10 MG tablet Take 1 tablet (10 mg total) by mouth 2 (two) times daily.   solifenacin (VESICARE) 5 MG tablet TAKE ONE TABLET BY MOUTH EVERY DAY   Sulfacetamide Sodium-Sulfur (PLEXION) 9.8-4.8 % CREA Apply to affected areas twice  daily   traMADol (ULTRAM) 50 MG tablet Take 50 mg by mouth every 6 (six) hours as needed.   venlafaxine XR (EFFEXOR-XR) 37.5 MG 24 hr capsule TAKE ONE CAPSULE BY MOUTH EVERY DAY   [DISCONTINUED] atorvastatin (LIPITOR) 80 MG tablet Take 1 tablet (80 mg total) by mouth daily.   [DISCONTINUED] citalopram (CELEXA) 20 MG tablet 1 tablet (20 mg total) every evening.   [DISCONTINUED] fenofibrate 54 MG tablet TAKE ONE TABLET BY MOUTH DAILY WITH a MEAL   [DISCONTINUED] LAGEVRIO 200 MG CAPS capsule SMARTSIG:4 Capsule(s) By Mouth Every 12 Hours   [DISCONTINUED] methocarbamol (ROBAXIN) 500 MG tablet Take 500 mg by mouth as needed.   [DISCONTINUED] phentermine (ADIPEX-P) 37.5 MG tablet TAKE 1/2-1 TABLET BY MOUTH DAILY AS NEEDED   [DISCONTINUED] rosuvastatin (CRESTOR) 40 MG tablet One by mouth at bedtime.    Allergies: Patient is allergic to other. Family History: Patient family history includes Breast cancer (age of onset: 9) in her sister; Cancer in her father; Pancreatic cancer in her mother; Tremor in her brother, father, mother, and sister. Social History:  Patient  reports that she has never smoked. She has never used smokeless tobacco. She reports that she does not drink alcohol and does not use drugs.  Review of Systems: Constitutional: Negative for fever malaise or anorexia Cardiovascular: negative for chest pain Respiratory: negative for SOB or persistent cough Gastrointestinal: negative for abdominal pain  Objective  Vitals: BP 120/64   Pulse (!) 59   Temp 97.6 F (36.4 C)   Ht '5\' 4"'$  (3.790 m)   Wt 154 lb 9.6 oz (70.1 kg)   SpO2 94%   BMI 26.54 kg/m  General: no acute distress , A&Ox3 HEENT: PEERL, conjunctiva normal, neck is supple, right tm is retracted; left tm with serous effusion Cardiovascular:  RRR without murmur or gallop.  Respiratory:  Good breath sounds bilaterally, CTAB with normal respiratory effort  Commons side effects, risks, benefits, and alternatives for  medications and treatment plan prescribed today were discussed, and the patient expressed understanding of the given instructions. Patient is instructed to call or message via MyChart if he/she has any questions or concerns regarding our treatment plan. No barriers to understanding were identified. We discussed Red Flag symptoms and signs in detail. Patient expressed understanding regarding what to do in case of urgent or emergency type symptoms.  Medication list was reconciled, printed and provided to the patient in AVS. Patient instructions and summary information was reviewed with the patient as documented in the AVS. This note was prepared with assistance of Dragon voice recognition software. Occasional wrong-word or sound-a-like  substitutions may have occurred due to the inherent limitations of voice recognition software  This visit occurred during the SARS-CoV-2 public health emergency.  Safety protocols were in place, including screening questions prior to the visit, additional usage of staff PPE, and extensive cleaning of exam room while observing appropriate contact time as indicated for disinfecting solutions.

## 2022-03-11 NOTE — Patient Instructions (Signed)
Please follow up as scheduled for your next visit with me: 06/08/2022   If you have any questions or concerns, please don't hesitate to send me a message via MyChart or call the office at [phone]. Thank you for visiting with [place] today! It's our pleasure caring for you.

## 2022-03-12 DIAGNOSIS — T868409 Corneal transplant rejection, unspecified eye: Secondary | ICD-10-CM | POA: Diagnosis not present

## 2022-03-12 DIAGNOSIS — H43811 Vitreous degeneration, right eye: Secondary | ICD-10-CM | POA: Diagnosis not present

## 2022-03-12 DIAGNOSIS — Z9889 Other specified postprocedural states: Secondary | ICD-10-CM | POA: Diagnosis not present

## 2022-03-12 DIAGNOSIS — Z961 Presence of intraocular lens: Secondary | ICD-10-CM | POA: Diagnosis not present

## 2022-03-15 DIAGNOSIS — T868409 Corneal transplant rejection, unspecified eye: Secondary | ICD-10-CM | POA: Diagnosis not present

## 2022-03-15 DIAGNOSIS — Z961 Presence of intraocular lens: Secondary | ICD-10-CM | POA: Diagnosis not present

## 2022-03-15 DIAGNOSIS — Z9889 Other specified postprocedural states: Secondary | ICD-10-CM | POA: Diagnosis not present

## 2022-03-15 DIAGNOSIS — H43811 Vitreous degeneration, right eye: Secondary | ICD-10-CM | POA: Diagnosis not present

## 2022-03-15 DIAGNOSIS — T868402 Corneal transplant rejection, left eye: Secondary | ICD-10-CM | POA: Diagnosis not present

## 2022-03-17 DIAGNOSIS — M545 Low back pain, unspecified: Secondary | ICD-10-CM | POA: Diagnosis not present

## 2022-04-01 ENCOUNTER — Other Ambulatory Visit: Payer: Self-pay | Admitting: Family Medicine

## 2022-04-01 DIAGNOSIS — T868409 Corneal transplant rejection, unspecified eye: Secondary | ICD-10-CM | POA: Diagnosis not present

## 2022-04-01 DIAGNOSIS — T868402 Corneal transplant rejection, left eye: Secondary | ICD-10-CM | POA: Diagnosis not present

## 2022-04-01 DIAGNOSIS — Z961 Presence of intraocular lens: Secondary | ICD-10-CM | POA: Diagnosis not present

## 2022-04-01 DIAGNOSIS — Z9841 Cataract extraction status, right eye: Secondary | ICD-10-CM | POA: Diagnosis not present

## 2022-04-01 DIAGNOSIS — Z9842 Cataract extraction status, left eye: Secondary | ICD-10-CM | POA: Diagnosis not present

## 2022-04-01 DIAGNOSIS — H43811 Vitreous degeneration, right eye: Secondary | ICD-10-CM | POA: Diagnosis not present

## 2022-04-02 DIAGNOSIS — H903 Sensorineural hearing loss, bilateral: Secondary | ICD-10-CM | POA: Diagnosis not present

## 2022-04-21 ENCOUNTER — Other Ambulatory Visit: Payer: Self-pay | Admitting: Family Medicine

## 2022-04-26 DIAGNOSIS — M546 Pain in thoracic spine: Secondary | ICD-10-CM | POA: Diagnosis not present

## 2022-04-26 DIAGNOSIS — M791 Myalgia, unspecified site: Secondary | ICD-10-CM | POA: Diagnosis not present

## 2022-04-26 DIAGNOSIS — M545 Low back pain, unspecified: Secondary | ICD-10-CM | POA: Diagnosis not present

## 2022-05-03 DIAGNOSIS — Z9889 Other specified postprocedural states: Secondary | ICD-10-CM | POA: Diagnosis not present

## 2022-05-03 DIAGNOSIS — Z961 Presence of intraocular lens: Secondary | ICD-10-CM | POA: Diagnosis not present

## 2022-05-03 DIAGNOSIS — H43811 Vitreous degeneration, right eye: Secondary | ICD-10-CM | POA: Diagnosis not present

## 2022-05-13 ENCOUNTER — Telehealth: Payer: Self-pay | Admitting: Family Medicine

## 2022-05-13 ENCOUNTER — Ambulatory Visit (INDEPENDENT_AMBULATORY_CARE_PROVIDER_SITE_OTHER): Payer: PPO | Admitting: Internal Medicine

## 2022-05-13 VITALS — BP 130/80 | HR 60 | Temp 97.4°F | Ht 64.0 in | Wt 161.0 lb

## 2022-05-13 DIAGNOSIS — Z86010 Personal history of colonic polyps: Secondary | ICD-10-CM | POA: Diagnosis not present

## 2022-05-13 DIAGNOSIS — B0229 Other postherpetic nervous system involvement: Secondary | ICD-10-CM | POA: Diagnosis not present

## 2022-05-13 DIAGNOSIS — Z79899 Other long term (current) drug therapy: Secondary | ICD-10-CM | POA: Diagnosis not present

## 2022-05-13 DIAGNOSIS — K219 Gastro-esophageal reflux disease without esophagitis: Secondary | ICD-10-CM | POA: Diagnosis not present

## 2022-05-13 MED ORDER — OXYCODONE-ACETAMINOPHEN 10-325 MG PO TABS: 1.0000 | ORAL_TABLET | Freq: Three times a day (TID) | ORAL | 0 refills | Status: AC | PRN

## 2022-05-13 MED ORDER — VALACYCLOVIR HCL 1 G PO TABS: 1000.0000 mg | ORAL_TABLET | Freq: Three times a day (TID) | ORAL | 0 refills | Status: DC

## 2022-05-13 MED ORDER — PREDNISONE 20 MG PO TABS: ORAL_TABLET | ORAL | 0 refills | Status: DC

## 2022-05-13 NOTE — Telephone Encounter (Signed)
Caller states -scripts is unclear -1 tablet every eight hours -0.5 tablet every four hours   Please call back asap.

## 2022-05-13 NOTE — Progress Notes (Signed)
   SUSANNE BAUMGARNER is a 76 y.o. female who presents today for an office visit.  Assessment/Plan:  Overview:  Although no rash and she has h/o shingles vaccine, this is still most likely shingles in my medical opinion, due to exquisite tenderness and intractable pain over the band like region of her thoracolumbar left back.  If it doesn't resolve with shingles treatment- then return to Dr. Jonni Sanger in 2 weeks to eval for other causes.   Problem List Items Addressed This Visit   None Visit Diagnoses     Other postherpetic nervous system involvement    -  Primary   Relevant Medications   valACYclovir (VALTREX) 1000 MG tablet   predniSONE (DELTASONE) 20 MG tablet   oxyCODONE-acetaminophen (PERCOCET) 10-325 MG tablet           Subjective:  HPI:  This is a patient of Dr. Tamela Oddi who was sent over by the [facility] GI team for evaluation for possible shingles due to reporting to the PA that she has a band of pain over her thoracolumbar left spine that is tingling very painful and very tender for the last 2 to 3 days.  She had completed series for Zostavax and Shingrix vaccines and has never had an outbreak of shingles and is certain that there has not been any rash or blisters in the area where she has the pain.  She does have a remote history of back surgery but this was in her lower region of the back at L4-5.  Her last back MRI was in January.  She did not recall any injury or tweaking of the spine that might of flared up a left sided radiculopathy.  The pain does not feel particularly like it is radiating around the abdomen from the spine.  It does feel like it is near the skin level and it is very tender to the touch.  Pain does not cross the midline and it is 90% of the time very painful and all the things that she has tried including tramadol TENS unit muscle relaxer muscle rubs have all been completely ineffective and not even touch the pain.  She does have an interesting history of her mother in  law having an episode of shingles that never broke out in a rash.  She does not feel that it is related to any sort of muscle strain or back flare because she always works without getting a problem and then this week she has not worked and the problem is flared.  She does take chronic gabapentin and Effexor sore which would normally help neuropathy but she takes them for hot flashes primarily        Objective:  Physical Exam: BP 130/80   Pulse 60   Temp (!) 97.4 F (36.3 C) (Temporal)   Ht '5\' 4"'$  (1.626 m)   Wt 161 lb (73 kg)   SpO2 96%   BMI 27.64 kg/m    Gen: No acute distress, resting without obvious extreme discomfort. Psych: Normal affect and thought content  Problem specific physical exam findings:  No rash but exquisite tendernss on back in left t12-L1 nerve distribution/dermatome Grimace and guarding with palpation of this area, avoids sitting back in seat.        Loralee Pacas, MD 05/13/2022 1:00 PM

## 2022-05-13 NOTE — Telephone Encounter (Signed)
Informed pharmacy of correct instructions for taking percocet so that her prescription can be filled. Also spoke with pt to confirm understanding of instructions for taking the percocet.

## 2022-05-26 ENCOUNTER — Ambulatory Visit (INDEPENDENT_AMBULATORY_CARE_PROVIDER_SITE_OTHER): Payer: PPO | Admitting: Internal Medicine

## 2022-05-26 ENCOUNTER — Encounter: Payer: Self-pay | Admitting: Internal Medicine

## 2022-05-26 VITALS — BP 126/60 | HR 62 | Temp 97.7°F | Ht 64.0 in | Wt 161.4 lb

## 2022-05-26 DIAGNOSIS — L02419 Cutaneous abscess of limb, unspecified: Secondary | ICD-10-CM | POA: Diagnosis not present

## 2022-05-26 DIAGNOSIS — L0291 Cutaneous abscess, unspecified: Secondary | ICD-10-CM

## 2022-05-26 MED ORDER — DOXYCYCLINE HYCLATE 100 MG PO TABS: 100.0000 mg | ORAL_TABLET | Freq: Two times a day (BID) | ORAL | 0 refills | Status: DC

## 2022-05-26 NOTE — Progress Notes (Signed)
   Acute Office Visit  Subjective:     Patient ID: Laura Farrell, female    DOB: 1945/12/20, 76 y.o.   MRN: [id number]  Chief Complaint  Patient presents with   Follow-up    2 week follow up for shingles. Says she is all better and it never broke through the skin.    HPI No painnow and no rash ever but she says it did go up and around and didn't feel like kidney stone so she thinks it was shingles.   We thought we'd have to manage pain but she reports she decided to never taking any of the pain meds and just turn them in now that pain is gone.      Abscess of thigh -     WOUND CULTURE -     Doxycycline Hyclate; Take 1 tablet (100 mg total) by mouth 2 (two) times daily.  Dispense: 20 tablet; Refill: 0  Abscess           Objective:    BP 126/60 (BP Location: Right Arm)   Pulse 62   Temp 97.7 F (36.5 C) (Temporal)   Ht '5\' 4"'$  (1.626 m)   Wt 161 lb 6.4 oz (73.2 kg)   SpO2 98%   BMI 27.70 kg/m    Physical Exam  Boil on left thigh incompletely drained.  About 2 cm diam with deep induration- incompletely drained with I& d  Procedure Note   Pre-operative Diagnosis: abscess/boil  Post-operative Diagnosis: Same  Locations: left thigh  Indications: Diagnostic and therapeutic  Anesthesia:  gebauers  Verbal consent was obtained  Procedure Details  History of allergy to iodine: No  Patient informed of the risks (including bleeding and infection) and benefits of the procedure  Written informed consent obtained and scanned to chart.  The lesion and surrounding area was given a sterile prep using betadyne and draped in the usual sterile fashion.  A  18 gauge needle was used to penetrate the lesion while spraying with Gebauer's. Typical sebaceous material was then expressed from the incision site.  A wound culture was taken.   The specimen was sent for pathologic examination. The patient tolerated the procedure well.  EBL: 0.1 ml  Findings: Boil left  thigh  Condition: Stable  Complications: none.  Plan: 1. Instructed to keep the wound dry and covered for 24-48h and clean thereafter mainly to protect clothes 2. Warning signs of infection were reviewed - explained that the abscess was not able to be completely drained 3. Recommended that the patient use NSAID as needed for pain.      Assessment & Plan:   Problem List Items Addressed This Visit   None Visit Diagnoses     Abscess of thigh    -  Primary   Relevant Medications   doxycycline (VIBRA-TABS) 100 MG tablet   Other Relevant Orders   Wound culture   Abscess           Meds ordered this encounter  Medications   doxycycline (VIBRA-TABS) 100 MG tablet    Sig: Take 1 tablet (100 mg total) by mouth 2 (two) times daily.    Dispense:  20 tablet    Refill:  0    No follow-ups on file. Prn follow up only   Loralee Pacas, MD

## 2022-05-26 NOTE — Patient Instructions (Signed)
Call if you have any problems with the antibiotic or the wound.  Keep it covered but its ok to try squeeze out more pus.

## 2022-05-27 ENCOUNTER — Telehealth: Payer: Self-pay | Admitting: Family Medicine

## 2022-05-27 NOTE — Progress Notes (Signed)
Culture somehow grew nothing. See if the infection is clearing up with the antibiotic.

## 2022-05-27 NOTE — Telephone Encounter (Signed)
Pt states: -Really enjoyed last two appointments with Dr. Randol Kern at [facility] (05/13/22 & 05/26/22)  Pt Requests -Transition of primary care from Dr. Billey Chang at [facility] to Dr. Berniece Pap at [place] or Decline transfer?    Please route to [facility] pool with determination and follow up.

## 2022-05-29 LAB — WOUND CULTURE
MICRO NUMBER:: 13787860
SPECIMEN QUALITY:: ADEQUATE

## 2022-06-08 ENCOUNTER — Encounter: Payer: PPO | Admitting: Family Medicine

## 2022-06-08 DIAGNOSIS — Z09 Encounter for follow-up examination after completed treatment for conditions other than malignant neoplasm: Secondary | ICD-10-CM | POA: Diagnosis not present

## 2022-06-08 DIAGNOSIS — Z86010 Personal history of colonic polyps: Secondary | ICD-10-CM | POA: Diagnosis not present

## 2022-06-08 LAB — HM COLONOSCOPY

## 2022-06-09 ENCOUNTER — Encounter: Payer: Self-pay | Admitting: Family Medicine

## 2022-06-11 ENCOUNTER — Ambulatory Visit (INDEPENDENT_AMBULATORY_CARE_PROVIDER_SITE_OTHER): Payer: PPO | Admitting: Internal Medicine

## 2022-06-11 ENCOUNTER — Encounter: Payer: Self-pay | Admitting: Internal Medicine

## 2022-06-11 VITALS — BP 150/81 | HR 65 | Temp 97.5°F | Resp 14 | Ht 64.0 in | Wt 160.4 lb

## 2022-06-11 DIAGNOSIS — G4733 Obstructive sleep apnea (adult) (pediatric): Secondary | ICD-10-CM

## 2022-06-11 DIAGNOSIS — L02419 Cutaneous abscess of limb, unspecified: Secondary | ICD-10-CM

## 2022-06-11 DIAGNOSIS — M47816 Spondylosis without myelopathy or radiculopathy, lumbar region: Secondary | ICD-10-CM | POA: Insufficient documentation

## 2022-06-11 DIAGNOSIS — M546 Pain in thoracic spine: Secondary | ICD-10-CM | POA: Insufficient documentation

## 2022-06-11 DIAGNOSIS — K219 Gastro-esophageal reflux disease without esophagitis: Secondary | ICD-10-CM

## 2022-06-11 DIAGNOSIS — L719 Rosacea, unspecified: Secondary | ICD-10-CM

## 2022-06-11 DIAGNOSIS — R7303 Prediabetes: Secondary | ICD-10-CM

## 2022-06-11 DIAGNOSIS — H9190 Unspecified hearing loss, unspecified ear: Secondary | ICD-10-CM

## 2022-06-11 DIAGNOSIS — L0390 Cellulitis, unspecified: Secondary | ICD-10-CM | POA: Insufficient documentation

## 2022-06-11 DIAGNOSIS — E039 Hypothyroidism, unspecified: Secondary | ICD-10-CM

## 2022-06-11 DIAGNOSIS — Z9889 Other specified postprocedural states: Secondary | ICD-10-CM

## 2022-06-11 DIAGNOSIS — G250 Essential tremor: Secondary | ICD-10-CM

## 2022-06-11 DIAGNOSIS — G44229 Chronic tension-type headache, not intractable: Secondary | ICD-10-CM

## 2022-06-11 DIAGNOSIS — N3946 Mixed incontinence: Secondary | ICD-10-CM

## 2022-06-11 DIAGNOSIS — H6122 Impacted cerumen, left ear: Secondary | ICD-10-CM

## 2022-06-11 HISTORY — DX: Other specified postprocedural states: Z98.890

## 2022-06-11 HISTORY — DX: Spondylosis without myelopathy or radiculopathy, lumbar region: M47.816

## 2022-06-11 HISTORY — DX: Cellulitis, unspecified: L03.90

## 2022-06-11 HISTORY — DX: Unspecified hearing loss, unspecified ear: H91.90

## 2022-06-11 LAB — COMPREHENSIVE METABOLIC PANEL
ALT: 20 U/L (ref 0–35)
AST: 23 U/L (ref 0–37)
Albumin: 3.8 g/dL (ref 3.5–5.2)
Alkaline Phosphatase: 46 U/L (ref 39–117)
BUN: 13 mg/dL (ref 6–23)
CO2: 27 mEq/L (ref 19–32)
Calcium: 9.5 mg/dL (ref 8.4–10.5)
Chloride: 111 mEq/L (ref 96–112)
Creatinine, Ser: 1.01 mg/dL (ref 0.40–1.20)
GFR: 53.32 mL/min — ABNORMAL LOW (ref >60.00)
Glucose, Bld: 100 mg/dL — ABNORMAL HIGH (ref 70–99)
Potassium: 5.5 mEq/L — ABNORMAL HIGH (ref 3.5–5.1)
Sodium: 144 mEq/L (ref 135–145)
Total Bilirubin: 0.3 mg/dL (ref 0.2–1.2)
Total Protein: 6.7 g/dL (ref 6.0–8.3)

## 2022-06-11 LAB — CBC
HCT: 31 % — ABNORMAL LOW (ref 36.0–46.0)
Hemoglobin: 10.1 g/dL — ABNORMAL LOW (ref 12.0–15.0)
MCHC: 32.6 g/dL (ref 30.0–36.0)
MCV: 85.5 fl (ref 78.0–100.0)
Platelets: 172 10*3/uL (ref 150.0–400.0)
RBC: 3.62 Mil/uL — ABNORMAL LOW (ref 3.87–5.11)
RDW: 18.8 % — ABNORMAL HIGH (ref 11.5–15.5)
WBC: 4.1 10*3/uL (ref 4.0–10.5)

## 2022-06-11 LAB — T4, FREE: Free T4: 0.8 ng/dL (ref 0.60–1.60)

## 2022-06-11 LAB — LIPID PANEL
Cholesterol: 186 mg/dL (ref 0–200)
HDL: 64.3 mg/dL (ref >39.00)
LDL Cholesterol: 103 mg/dL — ABNORMAL HIGH (ref 0–99)
NonHDL: 121.94
Total CHOL/HDL Ratio: 3
Triglycerides: 94 mg/dL (ref 0.0–149.0)
VLDL: 18.8 mg/dL (ref 0.0–40.0)

## 2022-06-11 LAB — TSH: TSH: 0.25 u[IU]/mL — ABNORMAL LOW (ref 0.35–5.50)

## 2022-06-11 LAB — HEMOGLOBIN A1C: Hgb A1c MFr Bld: 6.4 % (ref 4.6–6.5)

## 2022-06-11 MED ORDER — LEVOTHYROXINE SODIUM 88 MCG PO TABS: 88.0000 ug | ORAL_TABLET | Freq: Every day | ORAL | 3 refills | Status: DC

## 2022-06-11 MED ORDER — OMEPRAZOLE 10 MG PO CPDR: 10.0000 mg | DELAYED_RELEASE_CAPSULE | Freq: Every day | ORAL | 3 refills | Status: DC

## 2022-06-11 MED ORDER — TRAMADOL HCL 50 MG PO TABS: 50.0000 mg | ORAL_TABLET | Freq: Four times a day (QID) | ORAL | 0 refills | Status: DC | PRN

## 2022-06-11 MED ORDER — SOLIFENACIN SUCCINATE 5 MG PO TABS: 5.0000 mg | ORAL_TABLET | Freq: Every day | ORAL | 3 refills | Status: DC

## 2022-06-11 MED ORDER — METHOCARBAMOL 500 MG PO TABS: 500.0000 mg | ORAL_TABLET | Freq: Three times a day (TID) | ORAL | 3 refills | Status: DC

## 2022-06-11 MED ORDER — DOXYCYCLINE HYCLATE 100 MG PO TABS: 100.0000 mg | ORAL_TABLET | Freq: Two times a day (BID) | ORAL | 0 refills | Status: DC

## 2022-06-11 MED ORDER — DEBROX 6.5 % OT SOLN: 5.0000 [drp] | Freq: Two times a day (BID) | OTIC | 4 refills | Status: DC

## 2022-06-11 MED ORDER — METRONIDAZOLE 0.75 % EX GEL: 1.0000 | Freq: Two times a day (BID) | CUTANEOUS | 0 refills | Status: DC

## 2022-06-11 NOTE — Progress Notes (Signed)
Routine Medical Office Visit  Patient:  Laura Farrell      Age: 76 y.o.       Sex:  female  Date:   06/11/2022  PCP:    Loralee Pacas, MD  [place] Provider: Loralee Pacas, MD   Assessment/Plan:    Today's visit was a formal transfer of care visit and so I reviewed all of the problems and medications in the chart with the patient in detail.  She had a goal to try to reduce the number of medications that she was on and so I tried to get her to taper off fenofibrate and omeprazole.  We also tried to remove some others but everything else she feels she still needs.  In addition we also looked in her ear with a video otoscope and removed cerumen impactions but noted that hard impactions could not be fully removed and so Debrox was provided.  Also I did a follow-up on a boil/cellulitis that I had previously treated that does not fully resolved on her left leg-I do not think he needs further antibiotics but I did went ahead and sent in an in case it starts to come back.   Anniece was seen today for follow-up.  Mixed stress and urge urinary incontinence Assessment & Plan: Continue on vesicare, which helps a lot Chronic stable   Orders: -     Solifenacin Succinate; Take 1 tablet (5 mg total) by mouth daily.  Dispense: 90 tablet; Refill: 3  Cellulitis, unspecified cellulitis site  Hearing loss, unspecified hearing loss type, unspecified laterality Overview:  She follows with hearing study in trial at [facility] She often doesn't wear the hearing aids since her hearing is decent.   Chronic tension-type headache, not intractable Overview: Rare now, prefers to avoid meds   Prediabetes Overview: Lab Results  Component Value Date   HGBA1C 6.1 11/04/2021    Orders: -     Hemoglobin A1c -     CBC -     Comprehensive metabolic panel -     Lipid panel  Essential tremor Overview: Sees Dr. Carles Collet, neurology Takes primidone and propranolol Left>right   OSA  (obstructive sleep apnea) Overview: HST 2013:  AHI 12/hr Was advised she didn't need cpap anymore   Assessment & Plan: Perhaps in remission Need to get old records She says sleep is fine now without cpap.   Abscess of thigh -     Doxycycline Hyclate; Take 1 tablet (100 mg total) by mouth 2 (two) times daily.  Dispense: 20 tablet; Refill: 0  Gastroesophageal reflux disease without esophagitis Assessment & Plan: Chronic stable on omeprazole 20 Denies any symptoms and would like off med   Orders: -     Omeprazole; Take 1 capsule (10 mg total) by mouth daily.  Dispense: 90 capsule; Refill: 3  Acquired hypothyroidism Assessment & Plan: Chronic stable Check level Adjust based on lab result Refilled med for now.  Orders: -     TSH -     T4, free -     Levothyroxine Sodium; Take 1 tablet (88 mcg total) by mouth daily.  Dispense: 90 tablet; Refill: 3  History of back surgery Overview: Surgery 2005ish helped a lot, but still has pain up above the low back in her back.  Orders: -     traMADol HCl; Take 1 tablet (50 mg total) by mouth every 6 (six) hours as needed.  Dispense: 30 tablet; Refill: 0 -  Methocarbamol; Take 1 tablet (500 mg total) by mouth 3 (three) times daily.  Dispense: 270 tablet; Refill: 3  Impacted cerumen, left ear -     Debrox; Place 5 drops into both ears 2 (two) times daily.  Dispense: 15 mL; Refill: 4  Rosacea -     metroNIDAZOLE; Apply 1 Application topically 2 (two) times daily.  Dispense: 45 g; Refill: 0    Return in about 2 months (around 08/11/2022).   She was advised to call the office or go to ER if her condition worsens    Subjective:   Laura Farrell is a 76 y.o. female with PMH significant for: Past Medical History:  Diagnosis Date   Chronic tension headaches 08/14/2018   Diabetes mellitus    Headache(784.0)    Hearing loss 06/11/2022    She follows with hearing study in trial at [facility] She often doesn't wear the hearing aids  since her hearing is decent.   History of back surgery 06/11/2022   Surgery 2005ish helped a lot, but still has pain up above the low back in her back.   HLP (hyperkeratosis lenticularis perstans)    Hot flashes    Hyperlipidemia    Menopausal hot flushes 08/14/2018   On effexor and gabapentin   Nephrolithiasis    Over weight    Thyroid disease    Tubular adenoma of colon 12/27/2019   Colonoscopy, Dr. Cristina Gong, 02/2015: tubular adenoma, 5 year recall   Urine incontinence      Presenting today with: Chief Complaint  Patient presents with   Follow-up    For abscess on inner thigh. Has improved a lot, but still feels a knot there.     She follows with hearing study in trial at [facility]        Objective:  Physical Exam: BP (!) 150/81 (BP Location: Left Arm, Patient Position: Sitting)   Pulse 65   Temp (!) 97.5 F (36.4 C) (Temporal)   Resp 14   Ht '5\' 4"'$  (1.626 m)   Wt 160 lb 6.4 oz (72.8 kg)   SpO2 100%   BMI 27.53 kg/m    Gen: No acute distress, resting comfortably Psych: Normal affect and thought content  Problem specific physical exam findings:  There is still redness and a little subq induration in the left innner thigh where abscess was previously drained.  There is some stuck on cerumen in bilateral ears- mostly removed by manual debridement with video otoscope     Results: No results found for any visits on 06/11/22.   Recent Results (from the past 2160 hour(s))  Wound culture     Status: None   Collection Time: 05/26/22 10:41 AM   Specimen: Wound  Result Value Ref Range   MICRO NUMBER: [id number]    SPECIMEN QUALITY: Adequate    SOURCE: WOUND (SITE NOT SPECIFIED)    STATUS: FINAL    GRAM STAIN:      No white blood cells seen No epithelial cells seen No organisms seen   RESULT:      Growth of skin flora (note: Growth does not include S. aureus, beta-hemolytic Streptococci or P. aeruginosa). No source was provided. The specimen was tested and reported  based upon the test code ordered. If this is incorrect, please contact client  services.   HM COLONOSCOPY     Status: None   Collection Time: 06/08/22 12:00 AM  Result Value Ref Range   [facility] Colonoscopy See Report (in chart) See  Report (in chart), Patient Reported    Comment: [facility]

## 2022-06-11 NOTE — Assessment & Plan Note (Signed)
Chronic stable on omeprazole 20 Denies any symptoms and would like off med

## 2022-06-11 NOTE — Patient Instructions (Addendum)
It was a pleasure seeing you today!  Today the plan is...  Taper off omeprazole to eventually just as needed pepcid for GERD Stop fenofibrate   Mixed stress and urge urinary incontinence Assessment & Plan: Continue on vesicare, which helps a lot Chronic stable   Orders: -     Solifenacin Succinate; Take 1 tablet (5 mg total) by mouth daily.  Dispense: 90 tablet; Refill: 3  Cellulitis, unspecified cellulitis site  Hearing loss, unspecified hearing loss type, unspecified laterality  Chronic tension-type headache, not intractable  Prediabetes -     Hemoglobin A1c -     CBC -     Comprehensive metabolic panel -     Lipid panel  Essential tremor  OSA (obstructive sleep apnea) Assessment & Plan: Perhaps in remission Need to get old records She says sleep is fine now without cpap.   Abscess of thigh -     Doxycycline Hyclate; Take 1 tablet (100 mg total) by mouth 2 (two) times daily.  Dispense: 20 tablet; Refill: 0  Gastroesophageal reflux disease without esophagitis Assessment & Plan: Chronic stable on omeprazole 20 Denies any symptoms and would like off med   Orders: -     Omeprazole; Take 1 capsule (10 mg total) by mouth daily.  Dispense: 90 capsule; Refill: 3  Acquired hypothyroidism Assessment & Plan: Chronic stable Check level Adjust based on lab result Refilled med for now.  Orders: -     TSH -     T4, free -     Levothyroxine Sodium; Take 1 tablet (88 mcg total) by mouth daily.  Dispense: 90 tablet; Refill: 3  History of back surgery -     traMADol HCl; Take 1 tablet (50 mg total) by mouth every 6 (six) hours as needed.  Dispense: 30 tablet; Refill: 0 -     Methocarbamol; Take 1 tablet (500 mg total) by mouth 3 (three) times daily.  Dispense: 270 tablet; Refill: 3  Impacted cerumen, left ear -     Debrox; Place 5 drops into both ears 2 (two) times daily.  Dispense: 15 mL; Refill: 4  Rosacea -     metroNIDAZOLE; Apply 1 Application topically 2  (two) times daily.  Dispense: 45 g; Refill: 0      Loralee Pacas, MD   No follow-ups on file.    - Please bring all your medicines to your next appointment. This is the best way for me to know exactly what you're taking.  - If your condition begins to worsen or become severe:  go to the ER. - If your condition fails to resolve or you have other questions / concerns: please contact me via phone [phone] or MyChart messaging.     IF you received an x-ray today, you will receive an invoice from [facility]. Please contact [facility] at [phone] with questions or concerns regarding your invoice.    IF you received labwork today, you will receive an invoice from [place]. Please contact [facility] at [phone] with questions or concerns regarding your invoice.    Our billing staff will not be able to assist you with questions regarding bills from these companies.   You will be contacted with the lab results as soon as they are available. The fastest way to get your results is to activate your My Chart account. Instructions are located on the last page of this paperwork. If you have not heard from [place] regarding the results in 2 weeks, please contact  this office. For any labs or imaging tests, we will call you if the results are significantly abnormal.  Most normal results will be posted to myChart as soon as they are available and I will comment on them there within 2-3 business days.

## 2022-06-11 NOTE — Assessment & Plan Note (Signed)
Continue on vesicare, which helps a lot Chronic stable

## 2022-06-11 NOTE — Progress Notes (Signed)
Please call patient and offer to order repeat CBC BMP to follow-up on the lowering hemoglobin and potassium and also order a stool for occult blood for the lower hemoglobin.  Offered to have a follow-up appointment may be a couple days after all of that stuff is done to discuss the results and where to go next week to use new abnormalities

## 2022-06-11 NOTE — Assessment & Plan Note (Signed)
Chronic stable Check level Adjust based on lab result Refilled med for now.

## 2022-06-11 NOTE — Assessment & Plan Note (Signed)
Perhaps in remission Need to get old records She says sleep is fine now without cpap.

## 2022-06-18 ENCOUNTER — Other Ambulatory Visit (INDEPENDENT_AMBULATORY_CARE_PROVIDER_SITE_OTHER): Payer: PPO

## 2022-06-18 ENCOUNTER — Other Ambulatory Visit: Payer: Self-pay

## 2022-06-18 ENCOUNTER — Other Ambulatory Visit: Payer: PPO

## 2022-06-18 DIAGNOSIS — R7303 Prediabetes: Secondary | ICD-10-CM

## 2022-06-18 DIAGNOSIS — E875 Hyperkalemia: Secondary | ICD-10-CM

## 2022-06-18 LAB — CBC WITH DIFFERENTIAL/PLATELET
Basophils Absolute: 0 10*3/uL (ref 0.0–0.1)
Basophils Relative: 0.3 % (ref 0.0–3.0)
Eosinophils Absolute: 0 10*3/uL (ref 0.0–0.7)
Eosinophils Relative: 0.8 % (ref 0.0–5.0)
HCT: 32.5 % — ABNORMAL LOW (ref 36.0–46.0)
Hemoglobin: 10.6 g/dL — ABNORMAL LOW (ref 12.0–15.0)
Lymphocytes Relative: 53.2 % — ABNORMAL HIGH (ref 12.0–46.0)
Lymphs Abs: 2.5 10*3/uL (ref 0.7–4.0)
MCHC: 32.7 g/dL (ref 30.0–36.0)
MCV: 85.7 fl (ref 78.0–100.0)
Monocytes Absolute: 0.3 10*3/uL (ref 0.1–1.0)
Monocytes Relative: 6.4 % (ref 3.0–12.0)
Neutro Abs: 1.8 10*3/uL (ref 1.4–7.7)
Neutrophils Relative %: 39.3 % — ABNORMAL LOW (ref 43.0–77.0)
Platelets: 180 10*3/uL (ref 150.0–400.0)
RBC: 3.79 Mil/uL — ABNORMAL LOW (ref 3.87–5.11)
RDW: 19.1 % — ABNORMAL HIGH (ref 11.5–15.5)
WBC: 4.7 10*3/uL (ref 4.0–10.5)

## 2022-06-18 LAB — BASIC METABOLIC PANEL
BUN: 17 mg/dL (ref 6–23)
CO2: 27 mEq/L (ref 19–32)
Calcium: 9.8 mg/dL (ref 8.4–10.5)
Chloride: 109 mEq/L (ref 96–112)
Creatinine, Ser: 1.23 mg/dL — ABNORMAL HIGH (ref 0.40–1.20)
GFR: 42.09 mL/min — ABNORMAL LOW (ref >60.00)
Glucose, Bld: 117 mg/dL — ABNORMAL HIGH (ref 70–99)
Potassium: 5.9 mEq/L — ABNORMAL HIGH (ref 3.5–5.1)
Sodium: 141 mEq/L (ref 135–145)

## 2022-06-21 ENCOUNTER — Encounter: Payer: Self-pay | Admitting: Internal Medicine

## 2022-06-21 ENCOUNTER — Other Ambulatory Visit: Payer: Self-pay | Admitting: Internal Medicine

## 2022-06-21 ENCOUNTER — Other Ambulatory Visit (INDEPENDENT_AMBULATORY_CARE_PROVIDER_SITE_OTHER): Payer: PPO

## 2022-06-21 DIAGNOSIS — N179 Acute kidney failure, unspecified: Secondary | ICD-10-CM | POA: Diagnosis not present

## 2022-06-21 DIAGNOSIS — E875 Hyperkalemia: Secondary | ICD-10-CM

## 2022-06-21 HISTORY — DX: Acute kidney failure, unspecified: N17.9

## 2022-06-21 HISTORY — DX: Hyperkalemia: E87.5

## 2022-06-21 LAB — URINALYSIS, ROUTINE W REFLEX MICROSCOPIC
Bilirubin Urine: NEGATIVE
Hgb urine dipstick: NEGATIVE
Ketones, ur: NEGATIVE
Nitrite: NEGATIVE
Specific Gravity, Urine: 1.02 (ref 1.000–1.030)
Total Protein, Urine: NEGATIVE
Urine Glucose: NEGATIVE
Urobilinogen, UA: 0.2 (ref 0.0–1.0)
pH: 6 (ref 5.0–8.0)

## 2022-06-21 LAB — BASIC METABOLIC PANEL
BUN: 22 mg/dL (ref 6–23)
CO2: 26 mEq/L (ref 19–32)
Calcium: 9.9 mg/dL (ref 8.4–10.5)
Chloride: 108 mEq/L (ref 96–112)
Creatinine, Ser: 1.23 mg/dL — ABNORMAL HIGH (ref 0.40–1.20)
GFR: 42.08 mL/min — ABNORMAL LOW (ref >60.00)
Glucose, Bld: 109 mg/dL — ABNORMAL HIGH (ref 70–99)
Potassium: 5.3 mEq/L — ABNORMAL HIGH (ref 3.5–5.1)
Sodium: 142 mEq/L (ref 135–145)

## 2022-06-21 NOTE — Assessment & Plan Note (Signed)
Check bmp and ua

## 2022-06-21 NOTE — Progress Notes (Signed)
Recheck K and Cr for abnormal labs and get urine too

## 2022-07-03 ENCOUNTER — Other Ambulatory Visit: Payer: Self-pay | Admitting: Neurology

## 2022-07-03 DIAGNOSIS — G250 Essential tremor: Secondary | ICD-10-CM

## 2022-07-12 ENCOUNTER — Telehealth: Payer: Self-pay | Admitting: Internal Medicine

## 2022-07-12 NOTE — Telephone Encounter (Signed)
Patient states: - She was informed by PCP on her labs from 09/11 that she will need to come back to repeat in 1-4 weeks - It has been 3 weeks since then and she would like to complete labs again  Patient is scheduled to follow up with PCP on 10/06 @ 8am.   Patient requests: - Lab work be ordered so she can complete this prior to appointment - Would like to get a callback from clinical team in regards to this

## 2022-07-13 ENCOUNTER — Other Ambulatory Visit: Payer: Self-pay

## 2022-07-13 ENCOUNTER — Other Ambulatory Visit (INDEPENDENT_AMBULATORY_CARE_PROVIDER_SITE_OTHER): Payer: PPO

## 2022-07-13 DIAGNOSIS — N179 Acute kidney failure, unspecified: Secondary | ICD-10-CM

## 2022-07-13 NOTE — Telephone Encounter (Signed)
Spoke with patient and scheduled her to come in for a lab visit today at 4pm.

## 2022-07-14 LAB — BASIC METABOLIC PANEL
BUN: 22 mg/dL (ref 6–23)
CO2: 25 mEq/L (ref 19–32)
Calcium: 9.9 mg/dL (ref 8.4–10.5)
Chloride: 104 mEq/L (ref 96–112)
Creatinine, Ser: 1.11 mg/dL (ref 0.40–1.20)
GFR: 47.58 mL/min — ABNORMAL LOW (ref >60.00)
Glucose, Bld: 106 mg/dL — ABNORMAL HIGH (ref 70–99)
Potassium: 3.9 mEq/L (ref 3.5–5.1)
Sodium: 139 mEq/L (ref 135–145)

## 2022-07-14 LAB — URINALYSIS, ROUTINE W REFLEX MICROSCOPIC
Bilirubin Urine: NEGATIVE
Hgb urine dipstick: NEGATIVE
Ketones, ur: NEGATIVE
Nitrite: NEGATIVE
RBC / HPF: NONE SEEN (ref 0–?)
Specific Gravity, Urine: <=1.005 — AB (ref 1.000–1.030)
Total Protein, Urine: NEGATIVE
Urine Glucose: NEGATIVE
Urobilinogen, UA: 0.2 (ref 0.0–1.0)
pH: 6.5 (ref 5.0–8.0)

## 2022-07-15 NOTE — Progress Notes (Signed)
I have reviewed your recent testing.    The following abnormalities are noted:  potassium and kidney function both improved and stabilized, which is why we did this. Urinalysis still has some white blood cells All other values are normal, stable, or within acceptable limits.  Medication changes / Follow up labs / Other changes or recommendations:   If any urinary tract symptoms .... We could do some antibiotics, let me know  Loralee Pacas, MD  07/15/2022 10:15 AM   Understanding your Test Results:  IF you would like a more nuanced understanding of your testing and test results, please make an appointment for a detailed review and explanation of your tests.    This can be empowering!   If you don't have time for an appointment, you can still get pretty good explanations from online chat bots like ChatGPT.    If such a bot gives you a panic attack, please make an appointment with me to discuss why the bot is probably wrong.

## 2022-07-16 ENCOUNTER — Ambulatory Visit (INDEPENDENT_AMBULATORY_CARE_PROVIDER_SITE_OTHER): Payer: PPO | Admitting: Internal Medicine

## 2022-07-16 ENCOUNTER — Encounter: Payer: Self-pay | Admitting: Internal Medicine

## 2022-07-16 VITALS — BP 128/84 | HR 58 | Temp 98.0°F | Ht 64.0 in | Wt 165.8 lb

## 2022-07-16 DIAGNOSIS — E875 Hyperkalemia: Secondary | ICD-10-CM

## 2022-07-16 DIAGNOSIS — N1831 Chronic kidney disease, stage 3a: Secondary | ICD-10-CM | POA: Insufficient documentation

## 2022-07-16 DIAGNOSIS — H6120 Impacted cerumen, unspecified ear: Secondary | ICD-10-CM

## 2022-07-16 DIAGNOSIS — K219 Gastro-esophageal reflux disease without esophagitis: Secondary | ICD-10-CM | POA: Diagnosis not present

## 2022-07-16 DIAGNOSIS — N182 Chronic kidney disease, stage 2 (mild): Secondary | ICD-10-CM | POA: Insufficient documentation

## 2022-07-16 DIAGNOSIS — Z23 Encounter for immunization: Secondary | ICD-10-CM

## 2022-07-16 DIAGNOSIS — N183 Chronic kidney disease, stage 3 unspecified: Secondary | ICD-10-CM | POA: Diagnosis not present

## 2022-07-16 DIAGNOSIS — H6123 Impacted cerumen, bilateral: Secondary | ICD-10-CM | POA: Diagnosis not present

## 2022-07-16 HISTORY — DX: Chronic kidney disease, stage 3 unspecified: N18.30

## 2022-07-16 HISTORY — DX: Impacted cerumen, unspecified ear: H61.20

## 2022-07-16 LAB — URINALYSIS, ROUTINE W REFLEX MICROSCOPIC
Bilirubin Urine: NEGATIVE
Hgb urine dipstick: NEGATIVE
Ketones, ur: NEGATIVE
Nitrite: NEGATIVE
RBC / HPF: NONE SEEN (ref 0–?)
Specific Gravity, Urine: 1.015 (ref 1.000–1.030)
Total Protein, Urine: NEGATIVE
Urine Glucose: NEGATIVE
Urobilinogen, UA: 0.2 (ref 0.0–1.0)
pH: 6 (ref 5.0–8.0)

## 2022-07-16 MED ORDER — PEPCID COMPLETE 10-800-165 MG PO CHEW: 1.0000 | CHEWABLE_TABLET | Freq: Every day | ORAL | 11 refills | Status: DC | PRN

## 2022-07-16 MED ORDER — OMEPRAZOLE 20 MG PO CPDR: 20.0000 mg | DELAYED_RELEASE_CAPSULE | Freq: Every day | ORAL | 3 refills | Status: DC

## 2022-07-16 NOTE — Progress Notes (Signed)
[place] at [employer]:  [phone]   Routine Medical Office Visit  Patient:  Laura Farrell      Age: 76 y.o.       Sex:  female  Date:   07/16/2022  PCP:    Loralee Pacas, [place] Provider: Loralee Pacas, MD  Assessment/Plan:   Laura Farrell was seen today for follow-up.  Need for Tdap vaccination -     Tdap vaccine greater than or equal to 7yo IM  Stage 3 chronic kidney disease, unspecified whether stage 3a or 3b CKD ([place]) Overview: 07/16/22 We reviewed the possible causes of kidney injury like kidney stone but she has not had 1 in a very long time nonsteroidal anti-inflammatory drugs but she never takes them severe illness and viruses and dehydration but she cannot recall anything like that happening in the last 6 to 12 months new medications but she cannot think of taking any new ones urinary tract infections but she does not report having any diabetes but she does not have significant diabetes although her sugar does run a little bit high prediabetic range blood pressure crises her blood pressures are always good the she asked about iron but I reassured her that does not generally affect kidney function.       Assessment & Plan: Because we are not able to identify cause of her worsened kidney function I suggest we consider having a kidney specialist take a look at her even though her kidney function seems to be stable for now  I offered repeat bloodwork but she declines for now due ot diff stick  Orders: -     Urinalysis, Routine w reflex microscopic -     US RENAL; Future  Hyperkalemia Overview: We talked about her potassium and I think that this is a combination of too much potassium in the diet with kidney disease and her having developed worsening kidney disease this year and so I explained to her about a renal disease diet and limiting potassium to some degree on it although she does not have to do as much as she has been and I am giving  that diet is a handout for the after visit summary   Gastroesophageal reflux disease without esophagitis Assessment & Plan: Trying to trim off the omeprazole as there is kidney disease but going to 10 was just too difficult I am going to increase it back to 20 to make sure that she does not get an ulcer but I am also going to send in some Pepcid Complete to try to help her to taper again at a later time  Orders: -     Pepcid Complete; Chew 1 tablet by mouth daily as needed.  Dispense: 100 tablet; Refill: 11 -     Omeprazole; Take 1 capsule (20 mg total) by mouth daily.  Dispense: 30 capsule; Refill: 3  Cerumen debris on tympanic membrane of both ears Assessment & Plan:  Requested irrigation today and so it was completed PROCEDURE: CERUMEN DISIMPACTION   The patient had a large amount of cerumen in the external auditory canal(s): bilateral  Ear lavage was performed on bilateral ar(s) by MA Curettage by provider was not performed in addition.      Return in about 3 months (around 10/16/2022).     Subjective:   Laura Farrell is a 76 y.o. female with PMH significant for: Past Medical History:  Diagnosis Date   Acute kidney injury ([place])  06/21/2022   Cerumen debris on tympanic membrane 07/16/2022   Chronic tension headaches 08/14/2018   CKD (chronic kidney disease) stage 3, GFR 30-59 ml/min (HCC) 07/16/2022   Diabetes mellitus    Headache(784.0)    Hearing loss 06/11/2022    She follows with hearing study in trial at [facility] She often doesn't wear the hearing aids since her hearing is decent.   History of back surgery 06/11/2022   Surgery 2005ish helped a lot, but still has pain up above the low back in her back.   HLP (hyperkeratosis lenticularis perstans)    Hot flashes    Hyperlipidemia    Menopausal hot flushes 08/14/2018   On effexor and gabapentin   Nephrolithiasis    Over weight    Thyroid disease    Tubular adenoma of colon 12/27/2019   Colonoscopy, Dr. Cristina Gong, 02/2015:  tubular adenoma, 5 year recall   Urine incontinence     She is presenting today with: Chief Complaint  Patient presents with   Follow-up    Pt here for Potassium followup, and pt wants you to check iron level. Pt wants TDAP     Additional physician collected history: She reports that she has been cutting back potassium in her diet has been very difficult to not have bananas but it has dramatically improved to improved her potassium in the last month where now she is even on the low side of normal and she can lighten up on the potassium and have a little bit here and there. She also wants to review the kidney function which shows that the kidney function was in the 70s a year ago but then dropped down about 8 months ago to the 3s and has been running in the high 140s ever since about the last couple months maybe even more since the last check 8 months ago We reviewed the possible causes of kidney injury like kidney stone but she has not had 1 in a very long time nonsteroidal anti-inflammatory drugs but she never takes them severe illness and viruses and dehydration but she cannot recall anything like that happening in the last 6 to 12 months new medications but she cannot think of taking any new ones urinary tract infections but she does not report having any diabetes but she does not have significant diabetes although her sugar does run a little bit high prediabetic range blood pressure crises her blood pressures are always good the she asked about iron but I reassured her that does not generally affect kidney function.             Objective:  Physical Exam: BP 128/84 (BP Location: Left Arm, Patient Position: Sitting)   Pulse (!) 58   Temp 98 F (36.7 C) (Temporal)   Ht '5\' 4"'  (1.626 m)   Wt 165 lb 12.8 oz (75.2 kg)   SpO2 99%   BMI 28.46 kg/m   She  is a polite, friendly, and genuine person Constitutional: NAD, AAO, not ill-appearing  Neuro: alert, no focal deficit obvious,  articulate speech Psych: normal mood, behavior, thought content   Problem specific physical exam findings:  We looked in her ear and there were some loose wax is in both sides but nothing stuck on her tight mostly clear she has very narrow ear canals so was difficult to get a good view is probably why she has the recurring wax buildup so we did an irrigation at her request she would prefer to do the instrumentation  but I am not comfortable doing it with her very small ear canals   Recent Results (from the past 2160 hour(s))  Wound culture     Status: None   Collection Time: 05/26/22 10:41 AM   Specimen: Wound  Result Value Ref Range   MICRO NUMBER: [id number]    SPECIMEN QUALITY: Adequate    SOURCE: WOUND (SITE NOT SPECIFIED)    STATUS: FINAL    GRAM STAIN:      No white blood cells seen No epithelial cells seen No organisms seen   RESULT:      Growth of skin flora (note: Growth does not include S. aureus, beta-hemolytic Streptococci or P. aeruginosa). No source was provided. The specimen was tested and reported based upon the test code ordered. If this is incorrect, please contact client  services.   HM COLONOSCOPY     Status: None   Collection Time: 06/08/22 12:00 AM  Result Value Ref Range   HM Colonoscopy See Report (in chart) See Report (in chart), Patient Reported    Comment: [facility]  HgB A1c     Status: None   Collection Time: 06/11/22 11:19 AM  Result Value Ref Range   Hgb A1c MFr Bld 6.4 4.6 - 6.5 %    Comment: Glycemic Control Guidelines for People with Diabetes:Non Diabetic:  <6%Goal of Therapy: <7%Additional Action Suggested:  >8%   CBC     Status: Abnormal   Collection Time: 06/11/22 11:19 AM  Result Value Ref Range   WBC 4.1 4.0 - 10.5 K/uL   RBC 3.62 (L) 3.87 - 5.11 Mil/uL   Platelets 172.0 150.0 - 400.0 K/uL   Hemoglobin 10.1 (L) 12.0 - 15.0 g/dL   HCT 31.0 (L) 36.0 - 46.0 %   MCV 85.5 78.0 - 100.0 fl   MCHC 32.6 30.0 - 36.0 g/dL   RDW 18.8 (H) 11.5 -  15.5 %  Comp Met (CMET)     Status: Abnormal   Collection Time: 06/11/22 11:19 AM  Result Value Ref Range   Sodium 144 135 - 145 mEq/L   Potassium 5.5 No hemolysis seen (H) 3.5 - 5.1 mEq/L   Chloride 111 96 - 112 mEq/L   CO2 27 19 - 32 mEq/L   Glucose, Bld 100 (H) 70 - 99 mg/dL   BUN 13 6 - 23 mg/dL   Creatinine, Ser 1.01 0.40 - 1.20 mg/dL   Total Bilirubin 0.3 0.2 - 1.2 mg/dL   Alkaline Phosphatase 46 39 - 117 U/L   AST 23 0 - 37 U/L   ALT 20 0 - 35 U/L   Total Protein 6.7 6.0 - 8.3 g/dL   Albumin 3.8 3.5 - 5.2 g/dL   GFR 53.32 (L) >60.00 mL/min    Comment: Calculated using the CKD-EPI Creatinine Equation (2021)   Calcium 9.5 8.4 - 10.5 mg/dL  Lipid panel     Status: Abnormal   Collection Time: 06/11/22 11:19 AM  Result Value Ref Range   Cholesterol 186 0 - 200 mg/dL    Comment: ATP III Classification       Desirable:  < 200 mg/dL               Borderline High:  200 - 239 mg/dL          High:  > = 240 mg/dL   Triglycerides 94.0 0.0 - 149.0 mg/dL    Comment: Normal:  <150 mg/dLBorderline High:  150 - 199 mg/dL   HDL 64.30 >39.00 mg/dL  VLDL 18.8 0.0 - 40.0 mg/dL   LDL Cholesterol 103 (H) 0 - 99 mg/dL   Total CHOL/HDL Ratio 3     Comment:                Men          Women1/2 Average Risk     3.4          3.3Average Risk          5.0          4.42X Average Risk          9.6          7.13X Average Risk          15.0          11.0                       NonHDL 121.94     Comment: NOTE:  Non-HDL goal should be 30 mg/dL higher than patient's LDL goal (i.e. LDL goal of < 70 mg/dL, would have non-HDL goal of < 100 mg/dL)  TSH     Status: Abnormal   Collection Time: 06/11/22 11:19 AM  Result Value Ref Range   TSH 0.25 (L) 0.35 - 5.50 uIU/mL  T4, free     Status: None   Collection Time: 06/11/22 11:19 AM  Result Value Ref Range   Free T4 0.80 0.60 - 1.60 ng/dL    Comment: Specimens from patients who are undergoing biotin therapy and /or ingesting biotin supplements may contain high  levels of biotin.  The higher biotin concentration in these specimens interferes with this Free T4 assay.  Specimens that contain high levels  of biotin may cause false high results for this Free T4 assay.  Please interpret results in light of the total clinical presentation of the patient.    CBC w/Diff     Status: Abnormal   Collection Time: 06/18/22 11:05 AM  Result Value Ref Range   WBC 4.7 4.0 - 10.5 K/uL   RBC 3.79 (L) 3.87 - 5.11 Mil/uL   Hemoglobin 10.6 (L) 12.0 - 15.0 g/dL   HCT 32.5 (L) 36.0 - 46.0 %   MCV 85.7 78.0 - 100.0 fl   MCHC 32.7 30.0 - 36.0 g/dL   RDW 19.1 (H) 11.5 - 15.5 %   Platelets 180.0 150.0 - 400.0 K/uL   Neutrophils Relative % 39.3 (L) 43.0 - 77.0 %   Lymphocytes Relative 53.2 (H) 12.0 - 46.0 %   Monocytes Relative 6.4 3.0 - 12.0 %   Eosinophils Relative 0.8 0.0 - 5.0 %   Basophils Relative 0.3 0.0 - 3.0 %   Neutro Abs 1.8 1.4 - 7.7 K/uL   Lymphs Abs 2.5 0.7 - 4.0 K/uL   Monocytes Absolute 0.3 0.1 - 1.0 K/uL   Eosinophils Absolute 0.0 0.0 - 0.7 K/uL   Basophils Absolute 0.0 0.0 - 0.1 K/uL  Basic Metabolic Panel (BMET)     Status: Abnormal   Collection Time: 06/18/22 11:05 AM  Result Value Ref Range   Sodium 141 135 - 145 mEq/L   Potassium 5.9 (H) 3.5 - 5.1 mEq/L   Chloride 109 96 - 112 mEq/L   CO2 27 19 - 32 mEq/L   Glucose, Bld 117 (H) 70 - 99 mg/dL   BUN 17 6 - 23 mg/dL   Creatinine, Ser 1.23 (H) 0.40 - 1.20 mg/dL   GFR 42.09 (L) >60.00 mL/min  Comment: Calculated using the CKD-EPI Creatinine Equation (2021)   Calcium 9.8 8.4 - 10.5 mg/dL  Urinalysis, Routine w reflex microscopic     Status: Abnormal   Collection Time: 06/21/22  8:45 AM  Result Value Ref Range   Color, Urine YELLOW Yellow;Lt. Yellow;Straw;Dark Yellow;Amber;Green;Red;Brown   APPearance CLEAR Clear;Turbid;Slightly Cloudy;Cloudy   Specific Gravity, Urine 1.020 1.000 - 1.030   pH 6.0 5.0 - 8.0   Total Protein, Urine NEGATIVE Negative   Urine Glucose NEGATIVE Negative    Ketones, ur NEGATIVE Negative   Bilirubin Urine NEGATIVE Negative   Hgb urine dipstick NEGATIVE Negative   Urobilinogen, UA 0.2 0.0 - 1.0   Leukocytes,Ua SMALL (A) Negative   Nitrite NEGATIVE Negative   WBC, UA 3-6/hpf (A) 0-2/hpf   RBC / HPF 0-2/hpf 0-2/hpf   Mucus, UA Presence of (A) None   Squamous Epithelial / LPF Rare(0-4/hpf) HKUV(7-5/YNX)  Basic metabolic panel     Status: Abnormal   Collection Time: 06/21/22  8:45 AM  Result Value Ref Range   Sodium 142 135 - 145 mEq/L   Potassium 5.3 No hemolysis seen (H) 3.5 - 5.1 mEq/L   Chloride 108 96 - 112 mEq/L   CO2 26 19 - 32 mEq/L   Glucose, Bld 109 (H) 70 - 99 mg/dL   BUN 22 6 - 23 mg/dL   Creatinine, Ser 1.23 (H) 0.40 - 1.20 mg/dL   GFR 42.08 (L) >60.00 mL/min    Comment: Calculated using the CKD-EPI Creatinine Equation (2021)   Calcium 9.9 8.4 - 10.5 mg/dL  Basic Metabolic Panel (BMET)     Status: Abnormal   Collection Time: 07/13/22  4:10 PM  Result Value Ref Range   Sodium 139 135 - 145 mEq/L   Potassium 3.9 3.5 - 5.1 mEq/L   Chloride 104 96 - 112 mEq/L   CO2 25 19 - 32 mEq/L   Glucose, Bld 106 (H) 70 - 99 mg/dL   BUN 22 6 - 23 mg/dL   Creatinine, Ser 1.11 0.40 - 1.20 mg/dL   GFR 47.58 (L) >60.00 mL/min    Comment: Calculated using the CKD-EPI Creatinine Equation (2021)   Calcium 9.9 8.4 - 10.5 mg/dL  Urinalysis, Routine w reflex microscopic     Status: Abnormal   Collection Time: 07/13/22  4:10 PM  Result Value Ref Range   Color, Urine YELLOW Yellow;Lt. Yellow;Straw;Dark Yellow;Amber;Green;Red;Brown   APPearance CLEAR Clear;Turbid;Slightly Cloudy;Cloudy   Specific Gravity, Urine <=1.005 (A) 1.000 - 1.030   pH 6.5 5.0 - 8.0   Total Protein, Urine NEGATIVE Negative   Urine Glucose NEGATIVE Negative   Ketones, ur NEGATIVE Negative   Bilirubin Urine NEGATIVE Negative   Hgb urine dipstick NEGATIVE Negative   Urobilinogen, UA 0.2 0.0 - 1.0   Leukocytes,Ua SMALL (A) Negative   Nitrite NEGATIVE Negative   WBC, UA  0-2/hpf 0-2/hpf   RBC / HPF none seen 0-2/hpf   Squamous Epithelial / LPF Rare(0-4/hpf) Rare(0-4/hpf)

## 2022-07-16 NOTE — Progress Notes (Signed)
The trace leukocytes is less than there were before so this is basically a negative urinalysis even though there is that slightly abnormal finding we should check the urine every 3 months or so to keep an eye on it because of the kidney problem I recommend just planning for an every 63-monthvisit to the office for long-term for kidney disease management

## 2022-07-16 NOTE — Assessment & Plan Note (Signed)
  Requested irrigation today and so it was completed PROCEDURE: CERUMEN DISIMPACTION    The patient had a large amount of cerumen in the external auditory canal(s): bilateral   Ear lavage was performed on bilateral ar(s) by MA Curettage by provider was not performed in addition.

## 2022-07-16 NOTE — Assessment & Plan Note (Signed)
Trying to trim off the omeprazole as there is kidney disease but going to 10 was just too difficult I am going to increase it back to 20 to make sure that she does not get an ulcer but I am also going to send in some Pepcid Complete to try to help her to taper again at a later time

## 2022-07-16 NOTE — Assessment & Plan Note (Addendum)
Because we are not able to identify cause of her worsened kidney function I suggest we consider having a kidney specialist take a look at her even though her kidney function seems to be stable for now  I offered repeat bloodwork but she declines for now due ot diff stick

## 2022-07-16 NOTE — Patient Instructions (Addendum)
It was a pleasure seeing you today!  Today the plan is...  Need for Tdap vaccination -     Tdap vaccine greater than or equal to 78yo IM  Stage 3 chronic kidney disease, unspecified whether stage 3a or 3b CKD ([place]) Assessment & Plan: Because we are not able to identify cause of her worsened kidney function I suggest we consider having a kidney specialist take a look at her even though her kidney function seems to be stable for now  I offered repeat bloodwork but she declines for now due ot diff stick   Hyperkalemia     Laura Pacas, MD   No follow-ups on file.   - If your condition fails to resolve or you have other questions / concerns: please contact me via phone [phone] or MyChart messaging.  - Please bring all your medicines to your next appointment. This is the best way for me to know exactly what you're taking.  - If your condition begins to worsen or become severe:  go to the ER.   IF you received an x-ray today, you will receive an invoice from [facility]. Please contact [facility] at [phone] with questions or concerns regarding your invoice.    IF you received labwork today, you will receive an invoice from [facility]. Please contact [facility] at [phone] with questions or concerns regarding your invoice.    Our billing staff will not be able to assist you with questions regarding bills from these companies.   --------------------------------------------------------------------------------------------------------------------  You will be contacted with the lab results as soon as they are available. The fastest way to get your results is to activate your My Chart account. Instructions are located on the last page of this paperwork. If you have not heard from [place] regarding the results in 2 weeks, please contact this office. For any labs or imaging tests, we will call you if the results are significantly abnormal.  Most normal results will be  posted to myChart as soon as they are available and I will comment on them there within 2-3 business days.       Food Basics for Chronic Kidney Disease Chronic kidney disease (CKD) is when your kidneys are not working well. They cannot remove waste, fluids, and other substances from your blood the way they should. These substances can build up, which can worsen kidney damage and affect how your body works. Eating certain foods can lead to a buildup of these substances. Changing your diet can help prevent more kidney damage. Diet changes may also delay dialysis or even keep you from needing it. What nutrients should I limit? Work with your treatment team and a food expert (dietitian) to make a meal plan that's right for you. Foods you can eat and foods you should limit or avoid will depend on the stage of your kidney disease and any other health conditions you have. The items listed below are not a complete list. Talk with your dietitian to learn what is best for you. Potassium Potassium affects how steadily your heart beats. Too much potassium in your blood can cause an irregular heartbeat or even a heart attack. You may need to limit foods that are high in potassium, such as: Liquid milk and soy milk. Salt substitutes that contain potassium. Fruits like bananas, apricots, nectarines, melon, prunes, raisins, kiwi, and oranges. Vegetables, such as potatoes, sweet potatoes, yams, tomatoes, leafy greens, beets, avocado, pumpkin, and winter squash. Beans, like lima beans. Nuts. Phosphorus Phosphorus  is a mineral found in your bones. You need a balance between calcium and phosphorus to build and maintain healthy bones. Too much added phosphorus from the foods you eat can pull calcium from your bones. Losing calcium can make your bones weak and more likely to break. Too much phosphorus can also make your skin itch. You may need to limit foods that are high in phosphorus or that have added phosphorus,  such as: Liquid milk and dairy products. Dark-colored sodas or soft drinks. Bran cereals and oatmeal. Protein  Protein helps you make and keep muscle. Protein also helps to repair your body's cells and tissues. One of the natural breakdown products of protein is a waste product called urea. When your kidneys are not working well, they cannot remove types of waste like urea. Reducing protein in your diet can help keep urea from building up in your blood. Depending on your stage of kidney disease, you may need to eat smaller portions of foods that are high in protein. Sources of animal protein include: Meat (all types). Fish and seafood. Poultry. Eggs. Dairy. Other protein foods include: Beans and legumes. Nuts and nut butter. Soy, like tofu.  Sodium Salt (sodium) helps to keep a healthy balance of fluids in your body. Too much salt can increase your blood pressure, which can harm your heart and lungs. Extra salt can also cause your body to keep too much fluid, making your kidneys work harder. You may need to limit or avoid foods that are high in salt, such as: Salt seasonings. Soy and teriyaki sauce. Packaged, precooked, cured, or processed meats, such as sausages or meat loaves. Sardines. Salted crackers and snack foods. Fast food. Canned soups and most canned foods. Pickled foods. Vegetable juice. Boxed mixes or ready-to-eat boxed meals and side dishes. Bottled dressings, sauces, and marinades. Talk with your dietitian about how much potassium, phosphorus, protein, and salt you may have each day. Helpful tips Read food labels  Check the amount of salt in foods. Limit foods that have salt or sodium listed among the first five ingredients. Try to eat low-salt foods. Check the ingredient list for added phosphorus or potassium. "Phos" in an ingredient is a sign that phosphorus has been added. Do not buy foods that are calcium-enriched or that have calcium added to them (are  fortified). Buy canned vegetables and beans that say "no salt added" and rinse them before eating. Lifestyle Limit the amount of protein you eat from animal sources each day. Focus on protein from plant sources, like tofu and dried beans, peas, and lentils. Do not add salt to food when cooking or before eating. Do not eat star fruit. It can be toxic for people with kidney problems. Talk with your health care provider before taking any vitamin or mineral supplements. If told by your health care provider, track how much liquid you drink so you can avoid drinking too much. You may need to include foods you eat that are made mostly from water, like gelatin, ice cream, soups, and juicy fruits and vegetables. If you have diabetes: If you have diabetes (diabetes mellitus) and CKD, you need to keep your blood sugar (glucose) in the target range recommended by your health care provider. Follow your diabetes management plan. This may include: Checking your blood glucose regularly. Taking medicines by mouth, or taking insulin, or both. Exercising for at least 30 minutes on 5 or more days each week, or as told by your health care provider. Tracking how many  servings of carbohydrates you eat at each meal. Not using orange juice to treat low blood sugars. Instead, use apple juice, cranberry juice, or clear soda. You may be given guidelines on what foods and nutrients you may eat, and how much you can have each day. This depends on your stage of kidney disease and whether you have high blood pressure (hypertension). Follow the meal plan your dietitian gives you. To learn more: [employer] of Diabetes and Digestive and Kidney Diseases: [employer]: kidney.org Summary Chronic kidney disease (CKD) is when your kidneys are not working well. They cannot remove waste, fluids, and other substances from your blood the way they should. These substances can build up, which can worsen  kidney damage and affect how your body works. Changing your diet can help prevent more kidney damage. Diet changes may also delay dialysis or even keep you from needing it. Diet changes are different for each person with CKD. Work with a dietitian to set up a meal plan that is right for you. This information is not intended to replace advice given to you by your health care provider. Make sure you discuss any questions you have with your health care provider. Document Revised: 01/15/2022 Document Reviewed: 01/21/2020 Elsevier Patient Education  [place].

## 2022-08-05 ENCOUNTER — Other Ambulatory Visit: Payer: Self-pay | Admitting: Family Medicine

## 2022-08-05 ENCOUNTER — Other Ambulatory Visit: Payer: Self-pay | Admitting: Neurology

## 2022-08-05 DIAGNOSIS — G250 Essential tremor: Secondary | ICD-10-CM

## 2022-08-11 ENCOUNTER — Ambulatory Visit: Payer: PPO | Admitting: Internal Medicine

## 2022-08-11 DIAGNOSIS — Z947 Corneal transplant status: Secondary | ICD-10-CM | POA: Diagnosis not present

## 2022-08-11 DIAGNOSIS — Z961 Presence of intraocular lens: Secondary | ICD-10-CM | POA: Diagnosis not present

## 2022-08-11 DIAGNOSIS — K219 Gastro-esophageal reflux disease without esophagitis: Secondary | ICD-10-CM | POA: Diagnosis not present

## 2022-08-11 DIAGNOSIS — H43811 Vitreous degeneration, right eye: Secondary | ICD-10-CM | POA: Diagnosis not present

## 2022-08-11 NOTE — Telephone Encounter (Signed)
Patient already has an appointment scheduled for 11/7.

## 2022-08-12 DIAGNOSIS — M545 Low back pain, unspecified: Secondary | ICD-10-CM | POA: Diagnosis not present

## 2022-08-12 DIAGNOSIS — M546 Pain in thoracic spine: Secondary | ICD-10-CM | POA: Diagnosis not present

## 2022-08-17 ENCOUNTER — Ambulatory Visit (INDEPENDENT_AMBULATORY_CARE_PROVIDER_SITE_OTHER): Payer: PPO | Admitting: Internal Medicine

## 2022-08-17 ENCOUNTER — Telehealth: Payer: Self-pay | Admitting: Internal Medicine

## 2022-08-17 ENCOUNTER — Telehealth: Payer: Self-pay

## 2022-08-17 ENCOUNTER — Encounter: Payer: Self-pay | Admitting: Internal Medicine

## 2022-08-17 VITALS — BP 106/60 | HR 63 | Temp 98.3°F | Ht 64.0 in | Wt 171.8 lb

## 2022-08-17 DIAGNOSIS — R252 Cramp and spasm: Secondary | ICD-10-CM | POA: Diagnosis not present

## 2022-08-17 DIAGNOSIS — D631 Anemia in chronic kidney disease: Secondary | ICD-10-CM

## 2022-08-17 DIAGNOSIS — N183 Chronic kidney disease, stage 3 unspecified: Secondary | ICD-10-CM

## 2022-08-17 DIAGNOSIS — R490 Dysphonia: Secondary | ICD-10-CM

## 2022-08-17 DIAGNOSIS — M1712 Unilateral primary osteoarthritis, left knee: Secondary | ICD-10-CM | POA: Diagnosis not present

## 2022-08-17 DIAGNOSIS — Z860100 Personal history of colon polyps, unspecified: Secondary | ICD-10-CM | POA: Insufficient documentation

## 2022-08-17 DIAGNOSIS — K219 Gastro-esophageal reflux disease without esophagitis: Secondary | ICD-10-CM | POA: Diagnosis not present

## 2022-08-17 DIAGNOSIS — E0822 Diabetes mellitus due to underlying condition with diabetic chronic kidney disease: Secondary | ICD-10-CM | POA: Diagnosis not present

## 2022-08-17 DIAGNOSIS — E039 Hypothyroidism, unspecified: Secondary | ICD-10-CM | POA: Diagnosis not present

## 2022-08-17 HISTORY — DX: Anemia in chronic kidney disease: D63.1

## 2022-08-17 HISTORY — DX: Personal history of colon polyps, unspecified: Z86.0100

## 2022-08-17 LAB — COMPREHENSIVE METABOLIC PANEL
ALT: 13 U/L (ref 0–35)
AST: 16 U/L (ref 0–37)
Albumin: 4.4 g/dL (ref 3.5–5.2)
Alkaline Phosphatase: 65 U/L (ref 39–117)
BUN: 18 mg/dL (ref 6–23)
CO2: 26 mEq/L (ref 19–32)
Calcium: 9.5 mg/dL (ref 8.4–10.5)
Chloride: 104 mEq/L (ref 96–112)
Creatinine, Ser: 0.88 mg/dL (ref 0.40–1.20)
GFR: 62.83 mL/min (ref >60.00)
Glucose, Bld: 117 mg/dL — ABNORMAL HIGH (ref 70–99)
Potassium: 4.3 mEq/L (ref 3.5–5.1)
Sodium: 138 mEq/L (ref 135–145)
Total Bilirubin: 0.2 mg/dL (ref 0.2–1.2)
Total Protein: 7.1 g/dL (ref 6.0–8.3)

## 2022-08-17 LAB — URINALYSIS, ROUTINE W REFLEX MICROSCOPIC
Bilirubin Urine: NEGATIVE
Hgb urine dipstick: NEGATIVE
Ketones, ur: NEGATIVE
Nitrite: NEGATIVE
RBC / HPF: NONE SEEN (ref 0–?)
Specific Gravity, Urine: 1.015 (ref 1.000–1.030)
Total Protein, Urine: NEGATIVE
Urine Glucose: NEGATIVE
Urobilinogen, UA: 0.2 (ref 0.0–1.0)
pH: 6.5 (ref 5.0–8.0)

## 2022-08-17 LAB — URIC ACID: Uric Acid, Serum: 6.4 mg/dL (ref 2.4–7.0)

## 2022-08-17 LAB — VITAMIN D 25 HYDROXY (VIT D DEFICIENCY, FRACTURES): VITD: 15.55 ng/mL — ABNORMAL LOW (ref 30.00–100.00)

## 2022-08-17 LAB — CBC
HCT: 31.8 % — ABNORMAL LOW (ref 36.0–46.0)
Hemoglobin: 10.3 g/dL — ABNORMAL LOW (ref 12.0–15.0)
MCHC: 32.4 g/dL (ref 30.0–36.0)
MCV: 85.3 fl (ref 78.0–100.0)
Platelets: 216 10*3/uL (ref 150.0–400.0)
RBC: 3.73 Mil/uL — ABNORMAL LOW (ref 3.87–5.11)
RDW: 15.1 % (ref 11.5–15.5)
WBC: 5.1 10*3/uL (ref 4.0–10.5)

## 2022-08-17 LAB — PHOSPHORUS: Phosphorus: 4.5 mg/dL (ref 2.3–4.6)

## 2022-08-17 LAB — MICROALBUMIN / CREATININE URINE RATIO
Creatinine,U: 108.8 mg/dL
Microalb Creat Ratio: 0.6 mg/g (ref 0.0–30.0)
Microalb, Ur: <0.7 mg/dL (ref 0.0–1.9)

## 2022-08-17 LAB — FERRITIN: Ferritin: 4.4 ng/mL — ABNORMAL LOW (ref 10.0–291.0)

## 2022-08-17 LAB — T4, FREE: Free T4: 0.59 ng/dL — ABNORMAL LOW (ref 0.60–1.60)

## 2022-08-17 LAB — TSH: TSH: 2 u[IU]/mL (ref 0.35–5.50)

## 2022-08-17 MED ORDER — DICLOFENAC SOD-LIDOCAINE HCL 1-4.5 % EX GEL: 4.0000 g | Freq: Four times a day (QID) | CUTANEOUS | 3 refills | Status: DC | PRN

## 2022-08-17 MED ORDER — EMPAGLIFLOZIN 10 MG PO TABS: 10.0000 mg | ORAL_TABLET | Freq: Every day | ORAL | 3 refills | Status: DC

## 2022-08-17 MED ORDER — TIRZEPATIDE 2.5 MG/0.5ML ~~LOC~~ SOAJ: 2.5000 mg | SUBCUTANEOUS | 5 refills | Status: DC

## 2022-08-17 MED ORDER — CYCLOBENZAPRINE HCL 10 MG PO TABS: ORAL_TABLET | ORAL | 3 refills | Status: DC

## 2022-08-17 NOTE — Patient Instructions (Addendum)
It was a pleasure seeing you today! I truly hope you feel like you received 5 star service and please let me know if there is anything I can improve.  Loralee Pacas, MD   Today the plan is... No more taking of diclofenac tablet but the topical is okay I am going to recheck your kidneys and I will let you know how its going there but that is the reason we are holding on these medications if the kidney is improved back to normal we might be able to restart.  Also I am trying to get a new version of the Voltaren topical that works better would like with that at the pharmacy.  Also I am refilling her Flexeril and you are free to take tramadol as needed for pain you may you might need it more where we are stopping all of the nonsteroidal anti-inflammatory drugs such as diclofenac ibuprofen and naproxen also known as Motrin and Aleve.  Please come to lab work and be reachable by MyChart to discuss any abnormal findings and try the new medications that I sent him for your kidney and your diabetes and weight these are Jardiance and Mounjaro  Primary osteoarthritis of left knee Assessment & Plan: The patient is is chronic and stable but we are slowly making progress on improving the management play she requests refill of Flexeril and upgrade of her Voltaren gel for the 3% although we could not find that so I did the 1-4.5%. She also has tramadol and is not needing that much and she is following with a trainer so she is going to defer on my offer of physical therapy today She also has methocarbamol for pain. Had diclofenac and naproxen she was using but I reviewed her kidney functions from 2022-2023 and I am concerned that something is may be putting a strain on the kidney and so I advised that she take no more by mouth diclofenac but I am okay with the topical Is using the diclofenac neck 75 mg tablet regularly so I recommended that she stay extremely hydrated because that is a threat to her  kidneys  Orders: -     Diclofenac Sod-Lidocaine HCl; Apply 4 g topically 4 (four) times daily as needed (for pain).  Dispense: 1080 g; Refill: 3 -     Comprehensive metabolic panel  Stage 3 chronic kidney disease, unspecified whether stage 3a or 3b CKD (HCC) -     VITAMIN D 25 Hydroxy (Vit-D Deficiency, Fractures) -     Uric acid -     Phosphorus -     Microalbumin / creatinine urine ratio -     Urinalysis, Routine w reflex microscopic -     Empagliflozin; Take 1 tablet (10 mg total) by mouth daily before breakfast.  Dispense: 90 tablet; Refill: 3  Anemia due to stage 3 chronic kidney disease, unspecified whether stage 3a or 3b CKD (HCC) -     CBC -     Ferritin  Hypothyroidism (acquired) -     TSH -     T4, free  Acquired hypothyroidism Assessment & Plan: She has been taking levothyroxine 88 mcg daily She does not seem particularly clinically hypothymic but we had increased the dose due to a low TSH on September 1 we need to recheck and readjust the medicine   History of colon polyps  Gastroesophageal reflux disease without esophagitis Assessment & Plan: I plan to help famotidine is to be used to help her to  taper off the omeprazole by describing that there is a whole bunch of acid polyps in her stomach that have built up over time to try to overcome the omeprazole.  Those pumps take months to kind of be reduced after stopping the medicine and for that reason there is really bad rebound heartburn as the dose is reduced.  So the dose has to be reduced very slowly while using famotidine for breakthrough heartburn.  In addition anything that seems to be washing up into her esophagus or higher needs to be treated with Tums to get immediate acid relief for the acid damage kind of like pouring cold water over a burn     Diabetes mellitus due to underlying condition with stage 3 chronic kidney disease, unspecified whether long term insulin use, unspecified whether stage 3a or 3b CKD  ([place]) -     Tirzepatide; Inject 2.5 mg into the skin once a week.  Dispense: 2 mL; Refill: 5  Spasm -     Cyclobenzaprine HCl; TAKE ONE TABLET BY MOUTH THREE TIMES DAILY AS NEEDED FOR MUSCLE SPASMS  Dispense: 90 tablet; Refill: 3  Hoarse voice quality       '[x]'$  Return in about 6 weeks (around 09/28/2022) for review med problems, adjust meds as needed, jardiance/mounjaro/thyroid..   '[x]'$  If your condition begins to worsen or become severe:  GO to the ER  '[x]'$  If you are not doing well: RETURN to the office sooner  '[x]'$  If you have follow-up questions / concerns: please contact me  -via phone [phone] OR MyChart messaging   '[x]'$  Please bring all your medicines to each appointment  '[x]'$   You will be contacted with the lab results as soon as they are available. For any labs or imaging tests, we will call you if the results are significantly abnormal.  Most normal results will be posted to myChart as soon as they are available and I will comment on them there within 2-3 business days.  '[x]'$  Billing questions for xray and lab orders are billed from separate companies and questions/concerns should be directed to the [facility].  For visit charges please discuss with our administrative services.  '[x]'$  You will be contacted with the lab results as soon as they are available. The fastest way to get your results is to activate your My Chart account. Instructions are located on the last page of this paperwork. If you have not heard from [place] regarding the results in 2 weeks, please contact this office.

## 2022-08-17 NOTE — Assessment & Plan Note (Addendum)
I plan to help famotidine is to be used to help her to taper off the omeprazole by describing that there is a whole bunch of acid polyps in her stomach that have built up over time to try to overcome the omeprazole.  Those pumps take months to kind of be reduced after stopping the medicine and for that reason there is really bad rebound heartburn as the dose is reduced.  So the dose has to be reduced very slowly while using famotidine for breakthrough heartburn.  In addition anything that seems to be washing up into her esophagus or higher needs to be treated with Tums to get immediate acid relief for the acid damage kind of like pouring cold water over a burn  Does report she has a already experienced some of the rebound heartburn and she is using shoes and she seems confident that she can probably taper off I wish her the best and I did explain that most likely she will need to have Pepcid and she was available in her cabinet for the rest of her life even after she is off omeprazole for breakthrough heartburn

## 2022-08-17 NOTE — Telephone Encounter (Signed)
Patient states that the gel dicussed during visit today is Diclofenac 3%

## 2022-08-17 NOTE — Telephone Encounter (Signed)
Caller States: -pt picked up one box of the medication prescribed today 08/17/22.  -Estimated days it will last is 13 days -the medication is on back order and the next anticipated delivery is 11/24 but not guaranteed. -Diclofenac 3% is more readily available as an alternative.    No further follow up requested.    Diclofenac Sod-Lidocaine HCl 1-4.5 % GEL [[id number]]   [place], [place] - [address] [place], [place] [address] Phone: [phone]  Fax: [phone]

## 2022-08-17 NOTE — Assessment & Plan Note (Signed)
She has been taking levothyroxine 88 mcg daily She does not seem particularly clinically hypothymic but we had increased the dose due to a low TSH on September 1 we need to recheck and readjust the medicine

## 2022-08-17 NOTE — Progress Notes (Signed)
[place] at [employer]:  [phone]  Routine Medical Office Visit  Patient:  Laura Farrell      Age: 76 y.o.       Sex:  female  Date:   08/17/2022  PCP:    Loralee Pacas, [place] Provider: Loralee Pacas, MD  Assessment/Plan:   Shirlette was seen today for follow-up.  Primary osteoarthritis of left knee Overview: The pain is in the knees but she does have arthritis in other joints including her back  Assessment & Plan: The patient is is chronic and stable but we are slowly making progress on improving the management play she requests refill of Flexeril and upgrade of her Voltaren gel for the 3% although we could not find that so I did the 1-4.5%. She also has tramadol and is not needing that much and she is following with a trainer so she is going to defer on my offer of physical therapy today She also has methocarbamol for pain. Had diclofenac and naproxen she was using but I reviewed her kidney functions from 2022-2023 and I am concerned that something is may be putting a strain on the kidney and so I advised that she take no more by mouth diclofenac but I am okay with the topical Is using the diclofenac neck 75 mg tablet regularly so I recommended that she stay extremely hydrated because that is a threat to her kidneys  Orders: -     Diclofenac Sod-Lidocaine HCl; Apply 4 g topically 4 (four) times daily as needed (for pain).  Dispense: 1080 g; Refill: 3 -     Comprehensive metabolic panel  Stage 3 chronic kidney disease, unspecified whether stage 3a or 3b CKD (HCC) Overview: 07/16/22 We reviewed the possible causes of kidney injury like kidney stone but she has not had 1 in a very long time nonsteroidal anti-inflammatory drugs but she never takes them severe illness and viruses and dehydration but she cannot recall anything like that happening in the last 6 to 12 months new medications but she cannot think of taking any new ones urinary tract  infections but she does not report having any diabetes but she does not have significant diabetes although her sugar does run a little bit high prediabetic range blood pressure crises her blood pressures are always good the she asked about iron but I reassured her that does not generally affect kidney function.       Orders: -     VITAMIN D 25 Hydroxy (Vit-D Deficiency, Fractures) -     Uric acid -     Phosphorus -     Microalbumin / creatinine urine ratio -     Urinalysis, Routine w reflex microscopic -     Empagliflozin; Take 1 tablet (10 mg total) by mouth daily before breakfast.  Dispense: 90 tablet; Refill: 3  Anemia due to stage 3 chronic kidney disease, unspecified whether stage 3a or 3b CKD (HCC) -     CBC -     Ferritin  Hypothyroidism (acquired) -     TSH -     T4, free  Acquired hypothyroidism Overview: Lab Results  Component Value Date/Time   TSH 0.25 (L) 06/11/2022 11:19 AM   TSH 4.01 11/04/2021 12:20 PM   TSH 2.06 12/15/2020 08:34 AM   TSH 2.36 12/27/2019 09:10 AM   TSH 2.76 12/21/2018 10:16 AM    Assessment & Plan: She has been taking levothyroxine 88 mcg daily  She does not seem particularly clinically hypothymic but we had increased the dose due to a low TSH on September 1 we need to recheck and readjust the medicine   History of colon polyps Overview: She has requested records but difficult to find them in the chart. Last colonoscopy around August to September 2023 was completely negative for any polyps and she was advised no further follow-up would be needed by patient report The previous colonoscopy before that had 2 polyps in 2015 and she was advised to 7-year follow-up History of anal fissure   Gastroesophageal reflux disease without esophagitis Overview: She saw her gastroenterologist November 2023 and they were supportive of my recommendation to attempt taper of omeprazole for her kidneys Dr. Deliah Goody, PA-C. She has now picked up famotidine and  is about to start the tapering course  Assessment & Plan: I plan to help famotidine is to be used to help her to taper off the omeprazole by describing that there is a whole bunch of acid polyps in her stomach that have built up over time to try to overcome the omeprazole.  Those pumps take months to kind of be reduced after stopping the medicine and for that reason there is really bad rebound heartburn as the dose is reduced.  So the dose has to be reduced very slowly while using famotidine for breakthrough heartburn.  In addition anything that seems to be washing up into her esophagus or higher needs to be treated with Tums to get immediate acid relief for the acid damage kind of like pouring cold water over a burn  Does report she has a already experienced some of the rebound heartburn and she is using shoes and she seems confident that she can probably taper off I wish her the best and I did explain that most likely she will need to have Pepcid and she was available in her cabinet for the rest of her life even after she is off omeprazole for breakthrough heartburn     Diabetes mellitus due to underlying condition with stage 3 chronic kidney disease, unspecified whether long term insulin use, unspecified whether stage 3a or 3b CKD ([place]) -     Tirzepatide; Inject 2.5 mg into the skin once a week.  Dispense: 2 mL; Refill: 5  Spasm -     Cyclobenzaprine HCl; TAKE ONE TABLET BY MOUTH THREE TIMES DAILY AS NEEDED FOR MUSCLE SPASMS  Dispense: 90 tablet; Refill: 3  Hoarse voice quality     Return in about 6 weeks (around 09/28/2022) for review med problems, adjust meds as needed, jardiance/mounjaro/thyroid..   Today's key discussion points - also in After Visit Summary (AVS) Common side effects, risks, benefits, and alternatives for medications and treatment plan prescribed today were discussed, and she expressed understanding of the given instructions.  Medication list was reconciled and patient  instructions and summary information was documented and made available for her to review in the AVS (see AVS).  This note is also available to patient for review for accuracy and understanding. She was encouraged to contact our office by phone or message via MyChart if she has any questions or concerns regarding our treatment plan (see AVS).  Subjective:   LYLLIAN GAUSE is a 76 y.o. female with PMH significant for: Past Medical History:  Diagnosis Date   Acute kidney injury ([place]) 06/21/2022   Cerumen debris on tympanic membrane 07/16/2022   Chronic tension headaches 08/14/2018   CKD (chronic kidney disease) stage 3, GFR  30-59 ml/min ([place]) 07/16/2022   Diabetes mellitus    Headache(784.0)    Hearing loss 06/11/2022    She follows with hearing study in trial at [facility] She often doesn't wear the hearing aids since her hearing is decent.   History of back surgery 06/11/2022   Surgery 2005ish helped a lot, but still has pain up above the low back in her back.   History of colon polyps 08/17/2022   Has really just records but difficult to find them in the chart. Last colonoscopy around August to September 2023 was completely negative for any polyps and she was advised no further follow-up would be needed by patient report The previous colonoscopy before that had 2 polyps in 2015 and she was advised to 7-year follow-up History of anal fissure   HLP (hyperkeratosis lenticularis perstans)    Hot flashes    Hyperlipidemia    Menopausal hot flushes 08/14/2018   On effexor and gabapentin   Nephrolithiasis    Over weight    Thyroid disease    Tubular adenoma of colon 12/27/2019   Colonoscopy, Dr. Cristina Gong, 02/2015: tubular adenoma, 5 year recall   Urine incontinence      She is presenting today with: Chief Complaint  Patient presents with   Follow-up     Additional physician collected history: See Assessment/Plan section for per problem updates to history (overview and a/p  subsections) as reported by patient today. She has list of items she wants to discuss including rf cyclobenzaprine, try to get 3% voltaren topical (we couldn't), update me on her colonoscopy that was sent but I couldn't find in chart.         Objective:  Physical Exam: BP 106/60 (BP Location: Left Arm, Patient Position: Sitting)   Pulse 63   Temp 98.3 F (36.8 C) (Temporal)   Ht _0  (1.626 m)   Wt 171 lb 12.8 oz (77.9 kg)   SpO2 94%   BMI 29.49 kg/m   She is a polite, friendly, and genuine person Constitutional: NAD, AAO, not ill-appearing Neuro: alert, no focal deficit obvious, articulate speech Psych: normal mood, behavior, thought content  Problem specific physical exam findings:  Unchanged from prior hoarse voice  Physical Exam pictures taken at today's visit: No images are attached to the encounter or orders placed in the encounter.   Results reviewed Recent Results (from the past 2160 hour(s))  Wound culture     Status: None   Collection Time: 05/26/22 10:41 AM   Specimen: Wound  Result Value Ref Range   MICRO NUMBER: [id number]    SPECIMEN QUALITY: Adequate    SOURCE: WOUND (SITE NOT SPECIFIED)    STATUS: FINAL    GRAM STAIN:      No white blood cells seen No epithelial cells seen No organisms seen   RESULT:      Growth of skin flora (note: Growth does not include S. aureus, beta-hemolytic Streptococci or P. aeruginosa). No source was provided. The specimen was tested and reported based upon the test code ordered. If this is incorrect, please contact client  services.   HM COLONOSCOPY     Status: None   Collection Time: 06/08/22 12:00 AM  Result Value Ref Range   HM Colonoscopy See Report (in chart) See Report (in chart), Patient Reported    Comment: [facility]  HgB A1c     Status: None   Collection Time: 06/11/22 11:19 AM  Result Value Ref Range   Hgb A1c MFr  Bld 6.4 4.6 - 6.5 %    Comment: Glycemic Control Guidelines for People with Diabetes:Non  Diabetic:  <6%Goal of Therapy: <7%Additional Action Suggested:  >8%   CBC     Status: Abnormal   Collection Time: 06/11/22 11:19 AM  Result Value Ref Range   WBC 4.1 4.0 - 10.5 K/uL   RBC 3.62 (L) 3.87 - 5.11 Mil/uL   Platelets 172.0 150.0 - 400.0 K/uL   Hemoglobin 10.1 (L) 12.0 - 15.0 g/dL   HCT 31.0 (L) 36.0 - 46.0 %   MCV 85.5 78.0 - 100.0 fl   MCHC 32.6 30.0 - 36.0 g/dL   RDW 18.8 (H) 11.5 - 15.5 %  Comp Met (CMET)     Status: Abnormal   Collection Time: 06/11/22 11:19 AM  Result Value Ref Range   Sodium 144 135 - 145 mEq/L   Potassium 5.5 No hemolysis seen (H) 3.5 - 5.1 mEq/L   Chloride 111 96 - 112 mEq/L   CO2 27 19 - 32 mEq/L   Glucose, Bld 100 (H) 70 - 99 mg/dL   BUN 13 6 - 23 mg/dL   Creatinine, Ser 1.01 0.40 - 1.20 mg/dL   Total Bilirubin 0.3 0.2 - 1.2 mg/dL   Alkaline Phosphatase 46 39 - 117 U/L   AST 23 0 - 37 U/L   ALT 20 0 - 35 U/L   Total Protein 6.7 6.0 - 8.3 g/dL   Albumin 3.8 3.5 - 5.2 g/dL   GFR 53.32 (L) >60.00 mL/min    Comment: Calculated using the CKD-EPI Creatinine Equation (2021)   Calcium 9.5 8.4 - 10.5 mg/dL  Lipid panel     Status: Abnormal   Collection Time: 06/11/22 11:19 AM  Result Value Ref Range   Cholesterol 186 0 - 200 mg/dL    Comment: ATP III Classification       Desirable:  < 200 mg/dL               Borderline High:  200 - 239 mg/dL          High:  > = 240 mg/dL   Triglycerides 94.0 0.0 - 149.0 mg/dL    Comment: Normal:  <150 mg/dLBorderline High:  150 - 199 mg/dL   HDL 64.30 >39.00 mg/dL   VLDL 18.8 0.0 - 40.0 mg/dL   LDL Cholesterol 103 (H) 0 - 99 mg/dL   Total CHOL/HDL Ratio 3     Comment:                Men          Women1/2 Average Risk     3.4          3.3Average Risk          5.0          4.42X Average Risk          9.6          7.13X Average Risk          15.0          11.0                       NonHDL 121.94     Comment: NOTE:  Non-HDL goal should be 30 mg/dL higher than patient's LDL goal (i.e. LDL goal of < 70 mg/dL, would  have non-HDL goal of < 100 mg/dL)  TSH     Status: Abnormal   Collection Time: 06/11/22 11:19 AM  Result Value Ref Range   TSH 0.25 (L) 0.35 - 5.50 uIU/mL  T4, free     Status: None   Collection Time: 06/11/22 11:19 AM  Result Value Ref Range   Free T4 0.80 0.60 - 1.60 ng/dL    Comment: Specimens from patients who are undergoing biotin therapy and /or ingesting biotin supplements may contain high levels of biotin.  The higher biotin concentration in these specimens interferes with this Free T4 assay.  Specimens that contain high levels  of biotin may cause false high results for this Free T4 assay.  Please interpret results in light of the total clinical presentation of the patient.    CBC w/Diff     Status: Abnormal   Collection Time: 06/18/22 11:05 AM  Result Value Ref Range   WBC 4.7 4.0 - 10.5 K/uL   RBC 3.79 (L) 3.87 - 5.11 Mil/uL   Hemoglobin 10.6 (L) 12.0 - 15.0 g/dL   HCT 32.5 (L) 36.0 - 46.0 %   MCV 85.7 78.0 - 100.0 fl   MCHC 32.7 30.0 - 36.0 g/dL   RDW 19.1 (H) 11.5 - 15.5 %   Platelets 180.0 150.0 - 400.0 K/uL   Neutrophils Relative % 39.3 (L) 43.0 - 77.0 %   Lymphocytes Relative 53.2 (H) 12.0 - 46.0 %   Monocytes Relative 6.4 3.0 - 12.0 %   Eosinophils Relative 0.8 0.0 - 5.0 %   Basophils Relative 0.3 0.0 - 3.0 %   Neutro Abs 1.8 1.4 - 7.7 K/uL   Lymphs Abs 2.5 0.7 - 4.0 K/uL   Monocytes Absolute 0.3 0.1 - 1.0 K/uL   Eosinophils Absolute 0.0 0.0 - 0.7 K/uL   Basophils Absolute 0.0 0.0 - 0.1 K/uL  Basic Metabolic Panel (BMET)     Status: Abnormal   Collection Time: 06/18/22 11:05 AM  Result Value Ref Range   Sodium 141 135 - 145 mEq/L   Potassium 5.9 (H) 3.5 - 5.1 mEq/L   Chloride 109 96 - 112 mEq/L   CO2 27 19 - 32 mEq/L   Glucose, Bld 117 (H) 70 - 99 mg/dL   BUN 17 6 - 23 mg/dL   Creatinine, Ser 1.23 (H) 0.40 - 1.20 mg/dL   GFR 42.09 (L) >60.00 mL/min    Comment: Calculated using the CKD-EPI Creatinine Equation (2021)   Calcium 9.8 8.4 - 10.5 mg/dL   Urinalysis, Routine w reflex microscopic     Status: Abnormal   Collection Time: 06/21/22  8:45 AM  Result Value Ref Range   Color, Urine YELLOW Yellow;Lt. Yellow;Straw;Dark Yellow;Amber;Green;Red;Brown   APPearance CLEAR Clear;Turbid;Slightly Cloudy;Cloudy   Specific Gravity, Urine 1.020 1.000 - 1.030   pH 6.0 5.0 - 8.0   Total Protein, Urine NEGATIVE Negative   Urine Glucose NEGATIVE Negative   Ketones, ur NEGATIVE Negative   Bilirubin Urine NEGATIVE Negative   Hgb urine dipstick NEGATIVE Negative   Urobilinogen, UA 0.2 0.0 - 1.0   Leukocytes,Ua SMALL (A) Negative   Nitrite NEGATIVE Negative   WBC, UA 3-6/hpf (A) 0-2/hpf   RBC / HPF 0-2/hpf 0-2/hpf   Mucus, UA Presence of (A) None   Squamous Epithelial / LPF Rare(0-4/hpf) OVFI(4-3/PIR)  Basic metabolic panel     Status: Abnormal   Collection Time: 06/21/22  8:45 AM  Result Value Ref Range   Sodium 142 135 - 145 mEq/L   Potassium 5.3 No hemolysis seen (H) 3.5 - 5.1 mEq/L   Chloride 108 96 - 112 mEq/L   CO2 26 19 -  32 mEq/L   Glucose, Bld 109 (H) 70 - 99 mg/dL   BUN 22 6 - 23 mg/dL   Creatinine, Ser 1.23 (H) 0.40 - 1.20 mg/dL   GFR 42.08 (L) >60.00 mL/min    Comment: Calculated using the CKD-EPI Creatinine Equation (2021)   Calcium 9.9 8.4 - 10.5 mg/dL  Basic Metabolic Panel (BMET)     Status: Abnormal   Collection Time: 07/13/22  4:10 PM  Result Value Ref Range   Sodium 139 135 - 145 mEq/L   Potassium 3.9 3.5 - 5.1 mEq/L   Chloride 104 96 - 112 mEq/L   CO2 25 19 - 32 mEq/L   Glucose, Bld 106 (H) 70 - 99 mg/dL   BUN 22 6 - 23 mg/dL   Creatinine, Ser 1.11 0.40 - 1.20 mg/dL   GFR 47.58 (L) >60.00 mL/min    Comment: Calculated using the CKD-EPI Creatinine Equation (2021)   Calcium 9.9 8.4 - 10.5 mg/dL  Urinalysis, Routine w reflex microscopic     Status: Abnormal   Collection Time: 07/13/22  4:10 PM  Result Value Ref Range   Color, Urine YELLOW Yellow;Lt. Yellow;Straw;Dark Yellow;Amber;Green;Red;Brown   APPearance  CLEAR Clear;Turbid;Slightly Cloudy;Cloudy   Specific Gravity, Urine <=1.005 (A) 1.000 - 1.030   pH 6.5 5.0 - 8.0   Total Protein, Urine NEGATIVE Negative   Urine Glucose NEGATIVE Negative   Ketones, ur NEGATIVE Negative   Bilirubin Urine NEGATIVE Negative   Hgb urine dipstick NEGATIVE Negative   Urobilinogen, UA 0.2 0.0 - 1.0   Leukocytes,Ua SMALL (A) Negative   Nitrite NEGATIVE Negative   WBC, UA 0-2/hpf 0-2/hpf   RBC / HPF none seen 0-2/hpf   Squamous Epithelial / LPF Rare(0-4/hpf) Rare(0-4/hpf)  Urinalysis, Routine w reflex microscopic     Status: Abnormal   Collection Time: 07/16/22  8:55 AM  Result Value Ref Range   Color, Urine YELLOW Yellow;Lt. Yellow;Straw;Dark Yellow;Amber;Green;Red;Brown   APPearance CLEAR Clear;Turbid;Slightly Cloudy;Cloudy   Specific Gravity, Urine 1.015 1.000 - 1.030   pH 6.0 5.0 - 8.0   Total Protein, Urine NEGATIVE Negative   Urine Glucose NEGATIVE Negative   Ketones, ur NEGATIVE Negative   Bilirubin Urine NEGATIVE Negative   Hgb urine dipstick NEGATIVE Negative   Urobilinogen, UA 0.2 0.0 - 1.0   Leukocytes,Ua TRACE (A) Negative   Nitrite NEGATIVE Negative   WBC, UA 3-6/hpf (A) 0-2/hpf   RBC / HPF none seen 0-2/hpf   Squamous Epithelial / LPF Rare(0-4/hpf) Rare(0-4/hpf)

## 2022-08-17 NOTE — Assessment & Plan Note (Addendum)
The patient is is chronic and stable but we are slowly making progress on improving the management play she requests refill of Flexeril and upgrade of her Voltaren gel for the 3% although we could not find that so I did the 1-4.5%. She also has tramadol and is not needing that much and she is following with a trainer so she is going to defer on my offer of physical therapy today She also has methocarbamol for pain. Had diclofenac and naproxen she was using but I reviewed her kidney functions from 2022-2023 and I am concerned that something is may be putting a strain on the kidney and so I advised that she take no more by mouth diclofenac but I am okay with the topical Is using the diclofenac neck 75 mg tablet regularly so I recommended that she stay extremely hydrated because that is a threat to her kidneys

## 2022-08-17 NOTE — Progress Notes (Signed)
Help explain the my chart note to her and send in the medicines and referrals please once she understands and agrees.  The following abnormalities are noted:  hemoglobin around 10 still... but its not just from kidney disease, the iron levels very low.  Also the  kidney function is stable, the thyroid hormone is just barely not enough, recommend going up to about 100 mcg po daily (nurse to call in).  Vitamin D very low recommend a supplement with vitamin D3 and calcium daily.  Also the urine is still showing signs of mild inflammation... I think we should get a kidney specialist to look at this and see about what might be causing it.   All other values are normal, stable, or within acceptable limits.  Medication changes / Follow up labs / Other changes or recommendations:   Increase levothyroxine to 100 daily Take calcium with vitamin D3 daily Start iron sulfate 325 mg by mouth daily for iron deficiency. Accept referral to nephrology for ckd3 with hyaline casts in urine  Loralee Pacas, MD  08/17/2022 7:14 PM   Understanding your Test Results:  IF you would like a more nuanced understanding of your testing and test results, please make an appointment for a detailed review and explanation of your tests.    This can be empowering!   If you don't have time for an appointment, you can still get pretty good explanations from online chat bots like ChatGPT.    If such a bot gives you a panic attack, please make an appointment with me to discuss why the bot is probably wrong.

## 2022-08-23 ENCOUNTER — Other Ambulatory Visit: Payer: Self-pay

## 2022-08-23 DIAGNOSIS — M1712 Unilateral primary osteoarthritis, left knee: Secondary | ICD-10-CM

## 2022-08-23 MED ORDER — DICLOFENAC SODIUM 3 % EX GEL: 1.0000 | Freq: Every day | CUTANEOUS | 3 refills | Status: DC

## 2022-08-23 NOTE — Telephone Encounter (Signed)
Dr.Morrison sent in Diclofenac Sod-Lidocaine HCl 1-4.5 % GEL on 11/7. Left vm for patient to call the office back and confirm if she has picked this up yet.

## 2022-08-25 ENCOUNTER — Other Ambulatory Visit: Payer: Self-pay

## 2022-08-25 DIAGNOSIS — R82998 Other abnormal findings in urine: Secondary | ICD-10-CM

## 2022-08-25 DIAGNOSIS — E611 Iron deficiency: Secondary | ICD-10-CM

## 2022-08-25 DIAGNOSIS — N183 Chronic kidney disease, stage 3 unspecified: Secondary | ICD-10-CM

## 2022-08-25 DIAGNOSIS — E039 Hypothyroidism, unspecified: Secondary | ICD-10-CM

## 2022-08-25 MED ORDER — IRON (FERROUS SULFATE) 325 (65 FE) MG PO TABS: 325.0000 mg | ORAL_TABLET | Freq: Every day | ORAL | 3 refills | Status: DC

## 2022-08-25 MED ORDER — LEVOTHYROXINE SODIUM 100 MCG PO TABS: 100.0000 ug | ORAL_TABLET | Freq: Every day | ORAL | 3 refills | Status: DC

## 2022-08-26 NOTE — Telephone Encounter (Signed)
I have sent in the diclofenac 3% gel to the pharmacy previously and have completed the PA for this.

## 2022-09-01 ENCOUNTER — Ambulatory Visit
Admission: RE | Admit: 2022-09-01 | Discharge: 2022-09-01 | Disposition: A | Discharge status: Home / Self Care | Payer: PPO | Source: Ambulatory Visit | Attending: Internal Medicine | Admitting: Internal Medicine

## 2022-09-01 DIAGNOSIS — N2889 Other specified disorders of kidney and ureter: Secondary | ICD-10-CM | POA: Diagnosis not present

## 2022-09-01 DIAGNOSIS — N281 Cyst of kidney, acquired: Secondary | ICD-10-CM | POA: Diagnosis not present

## 2022-09-01 DIAGNOSIS — N183 Chronic kidney disease, stage 3 unspecified: Secondary | ICD-10-CM

## 2022-09-01 DIAGNOSIS — N189 Chronic kidney disease, unspecified: Secondary | ICD-10-CM | POA: Diagnosis not present

## 2022-09-03 NOTE — Progress Notes (Signed)
This shows a cyst that they recommend MRI to get a better view of.  Order mri kidney with and without contrast for renal cyst after notifying and explaining to patient

## 2022-09-06 ENCOUNTER — Other Ambulatory Visit: Payer: Self-pay

## 2022-09-06 DIAGNOSIS — N2889 Other specified disorders of kidney and ureter: Secondary | ICD-10-CM

## 2022-09-07 ENCOUNTER — Encounter: Payer: Self-pay | Admitting: Internal Medicine

## 2022-09-07 ENCOUNTER — Ambulatory Visit (INDEPENDENT_AMBULATORY_CARE_PROVIDER_SITE_OTHER): Payer: PPO | Admitting: Internal Medicine

## 2022-09-07 VITALS — BP 110/64 | HR 68 | Temp 97.7°F | Ht 64.0 in | Wt 173.4 lb

## 2022-09-07 DIAGNOSIS — Z800 Family history of malignant neoplasm of digestive organs: Secondary | ICD-10-CM | POA: Diagnosis not present

## 2022-09-07 DIAGNOSIS — N2889 Other specified disorders of kidney and ureter: Secondary | ICD-10-CM | POA: Diagnosis not present

## 2022-09-07 DIAGNOSIS — N281 Cyst of kidney, acquired: Secondary | ICD-10-CM | POA: Insufficient documentation

## 2022-09-07 NOTE — Assessment & Plan Note (Signed)
The main reason she came for today's visit was to discuss the ultrasound which we went over the images and the report together and she also wanted to inform me of her strong family history of pancreatic cancer and asked that the radiologist look at that closely.  Therefore I did add the family history of pancreatic cancer to her problem list and asked in the all staff messaging on how I can get that message to the radiologist

## 2022-09-07 NOTE — Progress Notes (Signed)
   Acute Office Visit  Subjective:     Patient ID: Laura Farrell, female    DOB: 04/21/1946, 76 y.o.   MRN: [id number]  Chief Complaint  Patient presents with   Results    Pt has an ultrasound of the kidney/bladder and she would like the results exlained to her     Patient is in today for explanation of ultrasound results, she would like more details on the report and to let us know she has strong family history pancreatic cancer so she wants them to check that on the MRI that is planned for the kidney cyst/mass     We found 2005 ultrasound renal that showed the R inferior kidney cyst when it was 1.4 cm greatest dimension now it is larger.  She has history of kidney stones could be associated.     BP 110/64   Pulse 68   Temp 97.7 F (36.5 C)   Ht '5\' 4"'$  (1.626 m)   Wt 173 lb 6.4 oz (78.7 kg)   SpO2 96%   BMI 29.76 kg/m  Wt Readings from Last 3 Encounters:  09/07/22 173 lb 6.4 oz (78.7 kg)  08/17/22 171 lb 12.8 oz (77.9 kg)  07/16/22 165 lb 12.8 oz (75.2 kg)   This is a polite, friendly, and genuine person who was a pleasure to meet.  Constitutional: NAD, AAO, not ill-appearing  HEENT:  NCAT, normal nose, mucous membranes moist Eyes:  sclera nonicteric, no injection Neuro: alert, no focal deficit obvious, articulate speech Psych: normal mood, behavior, thought content   .exam  Assessment & Plan:   Problem List Items Addressed This Visit       Active Problems   Right kidney mass - Primary    The main reason she came for today's visit was to discuss the ultrasound which we went over the images and the report together and she also wanted to inform me of her strong family history of pancreatic cancer and asked that the radiologist look at that closely.  Therefore I did add the family history of pancreatic cancer to her problem list and asked in the all staff messaging on how I can get that message to the radiologist      Family history of pancreatic cancer    I added  the above diagnosis and showed images of the ultrasound and reviewed the report with the patient.  I advised her there is low probability of renal cancer due to she never smoked, and low probability of pancreatic cancer due to the cyst is on the right kidney (far from pancreas) and pancreatic cancer rarely metastasizes to kidneys.  I also assured her I would do what I can to get radiologist to look at pancreas on MRI due to her strong family history pancreatic cancer. I spoke with our referral coordinator in teams about how to make it happen.  It won't let me incorporate this new information into the already scheduled MRI order, so I am listing it here in my clinical note and will request it go for clinical review.

## 2022-09-07 NOTE — Progress Notes (Signed)
Please evaluate the pancreas when reading the kidney cyst. Patient has strong family history pancreas cancer and would like report to note if it is normal appearing.

## 2022-09-09 ENCOUNTER — Ambulatory Visit (HOSPITAL_BASED_OUTPATIENT_CLINIC_OR_DEPARTMENT_OTHER)
Admission: RE | Admit: 2022-09-09 | Discharge: 2022-09-09 | Disposition: A | Discharge status: Home / Self Care | Payer: PPO | Source: Ambulatory Visit | Attending: Internal Medicine | Admitting: Internal Medicine

## 2022-09-09 DIAGNOSIS — N281 Cyst of kidney, acquired: Secondary | ICD-10-CM | POA: Diagnosis not present

## 2022-09-09 DIAGNOSIS — N2889 Other specified disorders of kidney and ureter: Secondary | ICD-10-CM | POA: Diagnosis not present

## 2022-09-09 MED ORDER — GADOBUTROL 1 MMOL/ML IV SOLN
7.4000 mL | Freq: Once | INTRAVENOUS | Status: AC | PRN
  Administered 2022-09-09: 7.4 mL via INTRAVENOUS
  Filled 2022-09-09: qty 7.5

## 2022-09-10 ENCOUNTER — Encounter: Payer: Self-pay | Admitting: Internal Medicine

## 2022-09-10 DIAGNOSIS — K449 Diaphragmatic hernia without obstruction or gangrene: Secondary | ICD-10-CM | POA: Insufficient documentation

## 2022-09-20 ENCOUNTER — Ambulatory Visit (INDEPENDENT_AMBULATORY_CARE_PROVIDER_SITE_OTHER): Payer: PPO | Admitting: Internal Medicine

## 2022-09-20 ENCOUNTER — Encounter: Payer: Self-pay | Admitting: Internal Medicine

## 2022-09-20 VITALS — BP 115/63 | HR 61 | Temp 97.6°F | Resp 12 | Ht 64.0 in | Wt 173.2 lb

## 2022-09-20 DIAGNOSIS — J069 Acute upper respiratory infection, unspecified: Secondary | ICD-10-CM

## 2022-09-20 MED ORDER — LORATADINE 10 MG PO TABS: 10.0000 mg | ORAL_TABLET | Freq: Every day | ORAL | 11 refills | Status: DC

## 2022-09-20 MED ORDER — DICLOFENAC SODIUM 1 % EX GEL: 2.0000 g | Freq: Four times a day (QID) | CUTANEOUS | 3 refills | Status: DC

## 2022-09-20 NOTE — Progress Notes (Signed)
Acute Office Visit  Subjective:   Patient ID: Laura Farrell, female    DOB: [birth year], 76 y.o.   MRN: [id number]  Chief Complaint  Patient presents with   Cough    Productive-yellowish greenish mucus since Wednesday. Taking Mucinex DM with no relief.   Hoarse   COVID negative    At home on Wednesday, Saturday and Sunday.    Cough Pertinent negatives include no chest pain, chills, ear pain, fever, heartburn (chronic), hemoptysis, myalgias (not from the illness, but some strain from coughing), rash, sore throat, shortness of breath, weight loss or wheezing.   Patient is in today for cough, hoarseness, and fatigue x 5 days  Review of Systems  Constitutional:  Positive for malaise/fatigue. Negative for chills, fever and weight loss.  HENT:  Positive for sinus pain. Negative for congestion, ear discharge, ear pain, hearing loss, nosebleeds, sore throat and tinnitus.   Eyes:  Positive for discharge (eye watering, tried allergy pills and switched to mucinex dm).  Respiratory:  Positive for cough and sputum production (yellow, green, thick- getting thicker). Negative for hemoptysis, shortness of breath and wheezing.   Cardiovascular:  Negative for chest pain, palpitations and orthopnea.  Gastrointestinal:  Negative for abdominal pain, heartburn (chronic), nausea and vomiting.  Genitourinary:  Negative for frequency (has leakage with the cough though).  Musculoskeletal:  Negative for back pain, falls, joint pain, myalgias (not from the illness, but some strain from coughing) and neck pain.  Skin:  Negative for itching and rash.      Objective:    BP 115/63 (BP Location: Left Arm, Patient Position: Sitting)   Pulse 61   Temp 97.6 F (36.4 C) (Temporal)   Resp 12   Ht '5\' 4"'$  (1.626 m)   Wt 173 lb 3.2 oz (78.6 kg)   SpO2 98%   BMI 29.73 kg/m  BP Readings from Last 3 Encounters:  09/20/22 115/63  09/07/22 110/64  08/17/22 106/60   Physical Exam Constitutional:      General:  She is not in acute distress.    Appearance: Normal appearance. She is not ill-appearing, toxic-appearing or diaphoretic.  HENT:     Head: Normocephalic and atraumatic.     Nose: Nose normal.  Eyes:     General: No scleral icterus.    Conjunctiva/sclera: Conjunctivae normal.  Pulmonary:     Effort: Pulmonary effort is normal. No respiratory distress.  Skin:    Coloration: Skin is not jaundiced.  Neurological:     General: No focal deficit present.     Mental Status: She is alert.  Psychiatric:        Mood and Affect: Mood normal.        Behavior: Behavior normal.        Thought Content: Thought content normal.        Judgment: Judgment normal.   Nose and throat examined and appear normal, lungs clear to auscultation     Assessment & Plan:    1. Upper respiratory tract infection, unspecified type, likely viral and/or allergic We agreed on conservative management only.  She said she only came because she was more ill yesterday, much better today so so she declined any swab testing.  Advised OTC allergy medications and simply saline and call me if it get severe and more thick or if she gets high fever.  We also briefly reviewed her recent MRI findings were reassuring that there is minimal to no risk  renal or pancreatic cancer.  One  acute uncomplicated illness managed and treated with OTC medication recommendations  Meds ordered this encounter  Medications   loratadine (CLARITIN) 10 MG tablet    Sig: Take 1 tablet (10 mg total) by mouth daily.    Dispense:  30 tablet    Refill:  11    Loralee Pacas, MD

## 2022-09-20 NOTE — Addendum Note (Signed)
Addended by: Joanette Gula on: 09/20/2022 10:25 AM   Modules accepted: Orders

## 2022-09-20 NOTE — Addendum Note (Signed)
Addended by: Joanette Gula on: 09/20/2022 10:40 AM   Modules accepted: Orders

## 2022-09-28 ENCOUNTER — Ambulatory Visit (INDEPENDENT_AMBULATORY_CARE_PROVIDER_SITE_OTHER): Payer: PPO | Admitting: Internal Medicine

## 2022-09-28 ENCOUNTER — Encounter: Payer: Self-pay | Admitting: Internal Medicine

## 2022-09-28 VITALS — BP 107/68 | HR 65 | Temp 97.8°F | Resp 12 | Ht 64.0 in | Wt 170.8 lb

## 2022-09-28 DIAGNOSIS — N393 Stress incontinence (female) (male): Secondary | ICD-10-CM | POA: Diagnosis not present

## 2022-09-28 DIAGNOSIS — R7303 Prediabetes: Secondary | ICD-10-CM

## 2022-09-28 DIAGNOSIS — E663 Overweight: Secondary | ICD-10-CM

## 2022-09-28 HISTORY — DX: Overweight: E66.3

## 2022-09-28 MED ORDER — BUPROPION HCL ER (SR) 100 MG PO TB12: 100.0000 mg | ORAL_TABLET | Freq: Every morning | ORAL | 3 refills | Status: DC

## 2022-09-28 MED ORDER — SOLIFENACIN SUCCINATE 10 MG PO TABS: 10.0000 mg | ORAL_TABLET | Freq: Every day | ORAL | 3 refills | Status: DC

## 2022-09-28 MED ORDER — TOPIRAMATE 25 MG PO TABS: 25.0000 mg | ORAL_TABLET | Freq: Two times a day (BID) | ORAL | 3 refills | Status: DC

## 2022-09-28 MED ORDER — METFORMIN HCL 500 MG PO TABS: 500.0000 mg | ORAL_TABLET | Freq: Two times a day (BID) | ORAL | 3 refills | Status: DC

## 2022-09-28 NOTE — Progress Notes (Deleted)
BMI Readings from Last 5 Encounters:  09/28/22 29.32 kg/m  09/20/22 29.73 kg/m  09/07/22 29.76 kg/m  08/17/22 29.49 kg/m  07/16/22 28.46 kg/m   Wt Readings from Last 20 Encounters:  09/28/22 170 lb 12.8 oz (77.5 kg)  09/20/22 173 lb 3.2 oz (78.6 kg)  09/07/22 173 lb 6.4 oz (78.7 kg)  08/17/22 171 lb 12.8 oz (77.9 kg)  07/16/22 165 lb 12.8 oz (75.2 kg)  06/11/22 160 lb 6.4 oz (72.8 kg)  05/26/22 161 lb 6.4 oz (73.2 kg)  05/13/22 161 lb (73 kg)  03/11/22 154 lb 9.6 oz (70.1 kg)  01/18/22 151 lb 9.6 oz (68.8 kg)  01/13/22 148 lb 9.6 oz (67.4 kg)  01/12/22 149 lb (67.6 kg)  01/11/22 147 lb 9.6 oz (67 kg)  01/05/22 148 lb 4 oz (67.2 kg)  11/04/21 143 lb 3.2 oz (65 kg)  03/02/21 162 lb 9.6 oz (73.8 kg)  01/07/21 172 lb (78 kg)  12/15/20 157 lb (71.2 kg)  06/10/20 170 lb 3.2 oz (77.2 kg)  02/26/20 176 lb 6.4 oz (80 kg)   Wt Readings from Last 50 Encounters:  09/28/22 170 lb 12.8 oz (77.5 kg)  09/20/22 173 lb 3.2 oz (78.6 kg)  09/07/22 173 lb 6.4 oz (78.7 kg)  08/17/22 171 lb 12.8 oz (77.9 kg)  07/16/22 165 lb 12.8 oz (75.2 kg)  06/11/22 160 lb 6.4 oz (72.8 kg)  05/26/22 161 lb 6.4 oz (73.2 kg)  05/13/22 161 lb (73 kg)  03/11/22 154 lb 9.6 oz (70.1 kg)  01/18/22 151 lb 9.6 oz (68.8 kg)  01/13/22 148 lb 9.6 oz (67.4 kg)  01/12/22 149 lb (67.6 kg)  01/11/22 147 lb 9.6 oz (67 kg)  01/05/22 148 lb 4 oz (67.2 kg)  11/04/21 143 lb 3.2 oz (65 kg)  03/02/21 162 lb 9.6 oz (73.8 kg)  01/07/21 172 lb (78 kg)  12/15/20 157 lb (71.2 kg)  06/10/20 170 lb 3.2 oz (77.2 kg)  02/26/20 176 lb 6.4 oz (80 kg)  02/04/20 176 lb (79.8 kg)  12/27/19 179 lb 3.7 oz (81.3 kg)  12/27/19 179 lb 3.2 oz (81.3 kg)  09/11/19 184 lb 9.6 oz (83.7 kg)  05/21/19 178 lb 3.2 oz (80.8 kg)  12/13/18 180 lb (81.6 kg)  09/28/18 178 lb 6.4 oz (80.9 kg)  09/25/18 173 lb 6.4 oz (78.7 kg)  09/15/18 177 lb 2 oz (80.3 kg)  08/14/18 170 lb 9.6 oz (77.4 kg)  08/04/18 171 lb 12.8 oz (77.9 kg)  07/19/18 168 lb  11.2 oz (76.5 kg)  06/13/18 171 lb (77.6 kg)  01/09/18 171 lb 11.2 oz (77.9 kg)  11/23/17 165 lb 6.4 oz (75 kg)  11/02/17 170 lb (77.1 kg)  05/31/17 160 lb 6.4 oz (72.8 kg)  04/27/17 161 lb (73 kg)  12/24/16 172 lb 11.2 oz (78.3 kg)  09/15/16 175 lb (79.4 kg)  09/13/16 172 lb 12.8 oz (78.4 kg)  04/06/16 172 lb (78 kg)  09/15/15 175 lb 6.4 oz (79.6 kg)  08/13/15 172 lb (78 kg)  03/12/15 173 lb (78.5 kg)  02/12/15 172 lb (78 kg)  02/05/15 170 lb (77.1 kg)  09/12/14 170 lb 6.4 oz (77.3 kg)  08/07/14 168 lb (76.2 kg)  06/05/14 161 lb (73 kg)

## 2022-09-28 NOTE — Progress Notes (Signed)
Laura Farrell Laura Farrell: [phone]   Routine Medical Office Visit  Patient:  Laura Farrell      Age: 76 y.o.       Sex:  female  Date:   09/28/2022  PCP:    Laura Farrell, [place] Provider: Loralee Pacas, MD  Assessment/Plan:   Laura Farrell was seen today for 6 week follow-up.  Overweight Overview: Wt Readings from Last 50 Encounters:  09/28/22 170 lb 12.8 oz (77.5 kg)  09/20/22 173 lb 3.2 oz (78.6 kg)  09/07/22 173 lb 6.4 oz (78.7 kg)  08/17/22 171 lb 12.8 oz (77.9 kg)  07/16/22 165 lb 12.8 oz (75.2 kg)  06/11/22 160 lb 6.4 oz (72.8 kg)  05/26/22 161 lb 6.4 oz (73.2 kg)  05/13/22 161 lb (73 kg)  03/11/22 154 lb 9.6 oz (70.1 kg)  01/18/22 151 lb 9.6 oz (68.8 kg)  01/13/22 148 lb 9.6 oz (67.4 kg)  01/12/22 149 lb (67.6 kg)  01/11/22 147 lb 9.6 oz (67 kg)  01/05/22 148 lb 4 oz (67.2 kg)  11/04/21 143 lb 3.2 oz (65 kg)  03/02/21 162 lb 9.6 oz (73.8 kg)  01/07/21 172 lb (78 kg)  12/15/20 157 lb (71.2 kg)  06/10/20 170 lb 3.2 oz (77.2 kg)  02/26/20 176 lb 6.4 oz (80 kg)  02/04/20 176 lb (79.8 kg)  12/27/19 179 lb 3.7 oz (81.3 kg)  12/27/19 179 lb 3.2 oz (81.3 kg)  09/11/19 184 lb 9.6 oz (83.7 kg)  05/21/19 178 lb 3.2 oz (80.8 kg)  12/13/18 180 lb (81.6 kg)  09/28/18 178 lb 6.4 oz (80.9 kg)  09/25/18 173 lb 6.4 oz (78.7 kg)  09/15/18 177 lb 2 oz (80.3 kg)  08/14/18 170 lb 9.6 oz (77.4 kg)  08/04/18 171 lb 12.8 oz (77.9 kg)  07/19/18 168 lb 11.2 oz (76.5 kg)  06/13/18 171 lb (77.6 kg)  01/09/18 171 lb 11.2 oz (77.9 kg)  11/23/17 165 lb 6.4 oz (75 kg)  11/02/17 170 lb (77.1 kg)  05/31/17 160 lb 6.4 oz (72.8 kg)  04/27/17 161 lb (73 kg)  12/24/16 172 lb 11.2 oz (78.3 kg)  09/15/16 175 lb (79.4 kg)  09/13/16 172 lb 12.8 oz (78.4 kg)  04/06/16 172 lb (78 kg)  09/15/15 175 lb 6.4 oz (79.6 kg)  08/13/15 172 lb (78 kg)  03/12/15 173 lb (78.5 kg)  02/12/15 172 lb (78 kg)  02/05/15 170 lb (77.1 kg)  09/12/14 170 lb 6.4 oz (77.3 kg)   08/07/14 168 lb (76.2 kg)  06/05/14 161 lb (73 kg)      Assessment & Plan: I have had an extensive 15 minute conversation today with the patient about healthy eating habits, exercise, calorie and carb goals for sustainable and successful weight loss. I gave the patient caloric and protein daily intake values as well as described the importance of increasing fiber and water intake. I discussed weight loss medications that could be used in the treatment of this patient. Handouts on low carb eating were given to the patient.     I explained to Laura Farrell that all of the new class weight loss drugs the GLP-1 agonists such as Ozempic Rybelsus Trulicity Mounjaro Wegovy Ozempic bound are all not covered by Medicare unless you have a diagnosis of obesity or heart or heart disease or diabetes and because she does not have these diagnoses I do not think we can get any of these medications affordably as they are  all going to run over thousand dollars and therefore we need to use less effective more side effect medications if we want to lose weight and I listed the options as metformin which causes nausea but usually if you go at a low dose it is mild to none, topiramate which causes things to taste funny I think, phentermine which would be high risk at her age because it is speed and will make the heart beat faster and I would do not recommend, Alli and fat blockers like it which run $50 or more and nobody ever stays on due to unexpected greasy diarrhea incontinence after fatty meals, I also mentioned that Wellbutrin/bupropion also compounded into Contrave for sake and separately would be reasonable although the Contrave would be too expensive.  She reported back to me that she has taken metformin and phentermine before and lost down to 140 with the phentermine prior.  I pointed out that phentermine has a bad problem of of losing its effectiveness after a little bit and then when you come off of it you gain the  weight back and that is exactly what happened with her and not crazy about using it long-term at her age but I would give it if she insists -but she feels she not like insisting at this time.  I explained that where it is a controlled substance if she changes her mind it would require another appointment for me to prescribe and we have to set up a contract which is another problem with that medicine.    After discussion of all of the above we felt that the best long-term plan would be a combination cocktail of metformin topiramate and Wellbutrin each of the low doses so that we do not get too much side effects from any of them  Encouraged her to return to her no sugar diet planning that has always worked in [employer] that I think that is a great diet lots of protein and I do recommend her to continue doing there exercise even on a weight loss plan so that she loses fat and not muscle want her to keep the muscle   Orders: -     metFORMIN HCl; Take 1 tablet (500 mg total) by mouth 2 (two) times daily with a meal.  Dispense: 180 tablet; Refill: 3 -     buPROPion HCl ER (SR); Take 1 tablet (100 mg total) by mouth in the morning.  Dispense: 90 tablet; Refill: 3 -     Topiramate; Take 1 tablet (25 mg total) by mouth 2 (two) times daily.  Dispense: 180 tablet; Refill: 3  Prediabetes Overview: Lab Results  Component Value Date   HGBA1C 6.1 11/04/2021     Stress incontinence -     Solifenacin Succinate; Take 1 tablet (10 mg total) by mouth daily.  Dispense: 90 tablet; Refill: 3       Subjective:   Laura Farrell is a 76 y.o. female with the following chart data reviewed: Past Medical History:  Diagnosis Date   Acute kidney injury ([place]) 06/21/2022   Cerumen debris on tympanic membrane 07/16/2022   Chronic tension headaches 08/14/2018   CKD (chronic kidney disease) stage 3, GFR 30-59 ml/min ([facility]) 07/16/2022   Diabetes mellitus    Headache(784.0)    Hearing loss 06/11/2022    She follows  with hearing study in trial at [facility] She often doesn't wear the hearing aids since her hearing is decent.   History of back surgery  06/11/2022   Surgery 2005ish helped a lot, but still has pain up above the low back in her back.   History of colon polyps 08/17/2022   Has really just records but difficult to find them in the chart. Last colonoscopy around August to September 2023 was completely negative for any polyps and she was advised no further follow-up would be needed by patient report The previous colonoscopy before that had 2 polyps in 2015 and she was advised to 7-year follow-up History of anal fissure   HLP (hyperkeratosis lenticularis perstans)    Hot flashes    Hyperlipidemia    Menopausal hot flushes 08/14/2018   On effexor and gabapentin   Nephrolithiasis    Over weight    Overweight 09/28/2022   Wt Readings from Last 50 Encounters: 09/28/22 170 lb 12.8 oz (77.5 kg) 09/20/22 173 lb 3.2 oz (78.6 kg) 09/07/22 173 lb 6.4 oz (78.7 kg) 08/17/22 171 lb 12.8 oz (77.9 kg) 07/16/22 165 lb 12.8 oz (75.2 kg) 06/11/22 160 lb 6.4 oz (72.8 kg) 05/26/22 161 lb 6.4 oz (73.2 kg) 05/13/22 161 lb (73 kg) 03/11/22 154 lb 9.6 oz (70.1 kg) 01/18/22 151 lb 9.6 oz (68.8 kg) 01/13/22 148 lb 9.6 oz (67.4   Thyroid disease    Tubular adenoma of colon 12/27/2019   Colonoscopy, Dr. Cristina Gong, 02/2015: tubular adenoma, 5 year recall   Urine incontinence    Outpatient Medications Prior to Visit  Medication Sig   aspirin 81 MG tablet Take 81 mg by mouth daily.   atorvastatin (LIPITOR) 80 MG tablet Take 1 tablet (80 mg total) by mouth daily.   carbamide peroxide (DEBROX) 6.5 % OTIC solution Place 5 drops into both ears 2 (two) times daily.   cephALEXin (KEFLEX) 250 MG capsule 1 capsule every 6 hours by oral route.   CREAM BASE EX Apply topically 2 (two) times daily.   cyclobenzaprine (FLEXERIL) 10 MG tablet TAKE ONE TABLET BY MOUTH THREE TIMES DAILY AS NEEDED FOR MUSCLE SPASMS   diclofenac  (VOLTAREN) 75 MG EC tablet Take 1 tablet (75 mg total) by mouth 2 (two) times daily as needed.   diclofenac Sodium (VOLTAREN) 1 % GEL Apply 2 g topically 4 (four) times daily.   Diclofenac Sodium 3 % GEL Apply 1 Application topically daily in the afternoon.   doxycycline (VIBRA-TABS) 100 MG tablet Take 1 tablet (100 mg total) by mouth 2 (two) times daily.   empagliflozin (JARDIANCE) 10 MG TABS tablet Take 1 tablet (10 mg total) by mouth daily before breakfast.   famotidine-calcium carbonate-magnesium hydroxide (PEPCID COMPLETE) 10-800-165 MG chewable tablet Chew 1 tablet by mouth daily as needed.   gabapentin (NEURONTIN) 600 MG tablet TAKE ONE TABLET BY MOUTH TWICE DAILY   Iron, Ferrous Sulfate, 325 (65 Fe) MG TABS Take 325 mg by mouth daily.   levothyroxine (SYNTHROID) 100 MCG tablet Take 1 tablet (100 mcg total) by mouth daily.   loratadine (CLARITIN) 10 MG tablet Take 1 tablet (10 mg total) by mouth daily.   methocarbamol (ROBAXIN) 500 MG tablet Take 1 tablet (500 mg total) by mouth 3 (three) times daily.   metroNIDAZOLE (METROGEL) 0.75 % gel Apply 1 Application topically 2 (two) times daily.   neomycin-polymyxin b-dexamethasone (MAXITROL) 3.5-10000-0.1 OINT Place into the left eye 4 times daily.   omeprazole (PRILOSEC) 20 MG capsule Take 1 capsule (20 mg total) by mouth daily.   phentermine (ADIPEX-P) 37.5 MG tablet TAKE 1/2-1 TABLET BY MOUTH DAILY AS NEEDED   prednisoLONE acetate (PRED  FORTE) 1 % ophthalmic suspension PLACE 1 DROP INTO LEFT EYE DAILY   primidone (MYSOLINE) 50 MG tablet TAKE 4 TABLETS BY MOUTH EVERY MORNING AND TWO TABLETS AT NIGHT   propranolol (INDERAL) 10 MG tablet TAKE ONE TABLET BY MOUTH TWICE DAILY   Sulfacetamide Sodium-Sulfur (PLEXION) 9.8-4.8 % CREA Apply to affected areas twice daily   tirzepatide [facility]) 2.5 MG/0.5ML Laura Inject 2.5 mg into the skin once a week.   traMADol (ULTRAM) 50 MG tablet Take 1 tablet (50 mg total) by mouth every 6 (six) hours as needed.    triamcinolone cream (KENALOG) 0.1 %    valACYclovir (VALTREX) 1000 MG tablet Take 1 tablet (1,000 mg total) by mouth 3 (three) times daily. For 7 days for shingles.   venlafaxine XR (EFFEXOR-XR) 37.5 MG 24 hr capsule TAKE ONE CAPSULE BY MOUTH EVERY DAY   [DISCONTINUED] solifenacin (VESICARE) 5 MG tablet Take 1 tablet (5 mg total) by mouth daily.   No facility-administered medications prior to visit.     She presented today reporting reason for visit as: Chief Complaint  Patient presents with   6 week follow-up    Everything is the same.     Problem-focused charting was used to record today's medical interview as problems and problem overview medical record updates as follows: Problem  Overweight   Wt Readings from Last 50 Encounters:  09/28/22 170 lb 12.8 oz (77.5 kg)  09/20/22 173 lb 3.2 oz (78.6 kg)  09/07/22 173 lb 6.4 oz (78.7 kg)  08/17/22 171 lb 12.8 oz (77.9 kg)  07/16/22 165 lb 12.8 oz (75.2 kg)  06/11/22 160 lb 6.4 oz (72.8 kg)  05/26/22 161 lb 6.4 oz (73.2 kg)  05/13/22 161 lb (73 kg)  03/11/22 154 lb 9.6 oz (70.1 kg)  01/18/22 151 lb 9.6 oz (68.8 kg)  01/13/22 148 lb 9.6 oz (67.4 kg)  01/12/22 149 lb (67.6 kg)  01/11/22 147 lb 9.6 oz (67 kg)  01/05/22 148 lb 4 oz (67.2 kg)  11/04/21 143 lb 3.2 oz (65 kg)  03/02/21 162 lb 9.6 oz (73.8 kg)  01/07/21 172 lb (78 kg)  12/15/20 157 lb (71.2 kg)  06/10/20 170 lb 3.2 oz (77.2 kg)  02/26/20 176 lb 6.4 oz (80 kg)  02/04/20 176 lb (79.8 kg)  12/27/19 179 lb 3.7 oz (81.3 kg)  12/27/19 179 lb 3.2 oz (81.3 kg)  09/11/19 184 lb 9.6 oz (83.7 kg)  05/21/19 178 lb 3.2 oz (80.8 kg)  12/13/18 180 lb (81.6 kg)  09/28/18 178 lb 6.4 oz (80.9 kg)  09/25/18 173 lb 6.4 oz (78.7 kg)  09/15/18 177 lb 2 oz (80.3 kg)  08/14/18 170 lb 9.6 oz (77.4 kg)  08/04/18 171 lb 12.8 oz (77.9 kg)  07/19/18 168 lb 11.2 oz (76.5 kg)  06/13/18 171 lb (77.6 kg)  01/09/18 171 lb 11.2 oz (77.9 kg)  11/23/17 165 lb 6.4 oz (75 kg)  11/02/17 170 lb  (77.1 kg)  05/31/17 160 lb 6.4 oz (72.8 kg)  04/27/17 161 lb (73 kg)  12/24/16 172 lb 11.2 oz (78.3 kg)  09/15/16 175 lb (79.4 kg)  09/13/16 172 lb 12.8 oz (78.4 kg)  04/06/16 172 lb (78 kg)  09/15/15 175 lb 6.4 oz (79.6 kg)  08/13/15 172 lb (78 kg)  03/12/15 173 lb (78.5 kg)  02/12/15 172 lb (78 kg)  02/05/15 170 lb (77.1 kg)  09/12/14 170 lb 6.4 oz (77.3 kg)  08/07/14 168 lb (76.2 kg)  06/05/14 161 lb (  73 kg)                 Objective:  Physical Exam  BP 107/68 (BP Location: Left Arm, Patient Position: Sitting)   Pulse 65   Temp 97.8 F (36.6 C) (Temporal)   Resp 12   Ht _0  (1.626 m)   Wt 170 lb 12.8 oz (77.5 kg)   SpO2 99%   BMI 29.32 kg/m  Vital signs reviewed.  Nursing notes reviewed. General Appearance/Constitutional:   Overweight female in no acute distress Musculoskeletal: All extremities are intact.  Neurological:  Awake, alert,  No obvious focal neurological deficits or cognitive impairments Psychiatric:  Appropriate mood, pleasant demeanor Problem-specific findings:  slight truncal obesity     Results Reviewed:  No results found for any visits on 09/28/22.   Recent Results (from the past 2160 hour(s))  Basic Metabolic Panel (BMET)     Status: Abnormal   Collection Time: 07/13/22  4:10 PM  Result Value Ref Range   Sodium 139 135 - 145 mEq/L   Potassium 3.9 3.5 - 5.1 mEq/L   Chloride 104 96 - 112 mEq/L   CO2 25 19 - 32 mEq/L   Glucose, Bld 106 (H) 70 - 99 mg/dL   BUN 22 6 - 23 mg/dL   Creatinine, Ser 1.11 0.40 - 1.20 mg/dL   GFR 47.58 (L) >60.00 mL/min    Comment: Calculated using the CKD-EPI Creatinine Equation (2021)   Calcium 9.9 8.4 - 10.5 mg/dL  Urinalysis, Routine w reflex microscopic     Status: Abnormal   Collection Time: 07/13/22  4:10 PM  Result Value Ref Range   Color, Urine YELLOW Yellow;Lt. Yellow;Farrell;Dark Yellow;Amber;Green;Red;Brown   APPearance CLEAR Clear;Turbid;Slightly Cloudy;Cloudy   Specific Gravity, Urine <=1.005  (A) 1.000 - 1.030   pH 6.5 5.0 - 8.0   Total Protein, Urine NEGATIVE Negative   Urine Glucose NEGATIVE Negative   Ketones, ur NEGATIVE Negative   Bilirubin Urine NEGATIVE Negative   Hgb urine dipstick NEGATIVE Negative   Urobilinogen, UA 0.2 0.0 - 1.0   Leukocytes,Ua SMALL (A) Negative   Nitrite NEGATIVE Negative   WBC, UA 0-2/hpf 0-2/hpf   RBC / HPF none seen 0-2/hpf   Squamous Epithelial / LPF Rare(0-4/hpf) Rare(0-4/hpf)  Urinalysis, Routine w reflex microscopic     Status: Abnormal   Collection Time: 07/16/22  8:55 AM  Result Value Ref Range   Color, Urine YELLOW Yellow;Lt. Yellow;Farrell;Dark Yellow;Amber;Green;Red;Brown   APPearance CLEAR Clear;Turbid;Slightly Cloudy;Cloudy   Specific Gravity, Urine 1.015 1.000 - 1.030   pH 6.0 5.0 - 8.0   Total Protein, Urine NEGATIVE Negative   Urine Glucose NEGATIVE Negative   Ketones, ur NEGATIVE Negative   Bilirubin Urine NEGATIVE Negative   Hgb urine dipstick NEGATIVE Negative   Urobilinogen, UA 0.2 0.0 - 1.0   Leukocytes,Ua TRACE (A) Negative   Nitrite NEGATIVE Negative   WBC, UA 3-6/hpf (A) 0-2/hpf   RBC / HPF none seen 0-2/hpf   Squamous Epithelial / LPF Rare(0-4/hpf) Rare(0-4/hpf)  CBC     Status: Abnormal   Collection Time: 08/17/22  8:55 AM  Result Value Ref Range   WBC 5.1 4.0 - 10.5 K/uL   RBC 3.73 (L) 3.87 - 5.11 Mil/uL   Platelets 216.0 150.0 - 400.0 K/uL   Hemoglobin 10.3 (L) 12.0 - 15.0 g/dL   HCT 31.8 (L) 36.0 - 46.0 %   MCV 85.3 78.0 - 100.0 fl   MCHC 32.4 30.0 - 36.0 g/dL   RDW 15.1 11.5 -  15.5 %  Ferritin     Status: Abnormal   Collection Time: 08/17/22  8:55 AM  Result Value Ref Range   Ferritin 4.4 (L) 10.0 - 291.0 ng/mL  TSH     Status: None   Collection Time: 08/17/22  8:55 AM  Result Value Ref Range   TSH 2.00 0.35 - 5.50 uIU/mL  T4, free     Status: Abnormal   Collection Time: 08/17/22  8:55 AM  Result Value Ref Range   Free T4 0.59 (L) 0.60 - 1.60 ng/dL    Comment: Specimens from patients who are  undergoing biotin therapy and /or ingesting biotin supplements may contain high levels of biotin.  The higher biotin concentration in these specimens interferes with this Free T4 assay.  Specimens that contain high levels  of biotin may cause false high results for this Free T4 assay.  Please interpret results in light of the total clinical presentation of the patient.    Comp Met (CMET)     Status: Abnormal   Collection Time: 08/17/22  8:55 AM  Result Value Ref Range   Sodium 138 135 - 145 mEq/L   Potassium 4.3 3.5 - 5.1 mEq/L   Chloride 104 96 - 112 mEq/L   CO2 26 19 - 32 mEq/L   Glucose, Bld 117 (H) 70 - 99 mg/dL   BUN 18 6 - 23 mg/dL   Creatinine, Ser 0.88 0.40 - 1.20 mg/dL   Total Bilirubin 0.2 0.2 - 1.2 mg/dL   Alkaline Phosphatase 65 39 - 117 U/L   AST 16 0 - 37 U/L   ALT 13 0 - 35 U/L   Total Protein 7.1 6.0 - 8.3 g/dL   Albumin 4.4 3.5 - 5.2 g/dL   GFR 62.83 >60.00 mL/min    Comment: Calculated using the CKD-EPI Creatinine Equation (2021)   Calcium 9.5 8.4 - 10.5 mg/dL  VITAMIN D 25 Hydroxy (Vit-D Deficiency, Fractures)     Status: Abnormal   Collection Time: 08/17/22  8:55 AM  Result Value Ref Range   VITD 15.55 (L) 30.00 - 100.00 ng/mL  Uric acid     Status: None   Collection Time: 08/17/22  8:55 AM  Result Value Ref Range   Uric Acid, Serum 6.4 2.4 - 7.0 mg/dL  Phosphorus     Status: None   Collection Time: 08/17/22  8:55 AM  Result Value Ref Range   Phosphorus 4.5 2.3 - 4.6 mg/dL  Microalbumin / creatinine urine ratio     Status: None   Collection Time: 08/17/22  8:55 AM  Result Value Ref Range   Microalb, Ur <0.7 0.0 - 1.9 mg/dL   Creatinine,U 108.8 mg/dL   Microalb Creat Ratio 0.6 0.0 - 30.0 mg/g  Urinalysis, Routine w reflex microscopic     Status: Abnormal   Collection Time: 08/17/22  8:55 AM  Result Value Ref Range   Color, Urine YELLOW Yellow;Lt. Yellow;Farrell;Dark Yellow;Amber;Green;Red;Brown   APPearance CLEAR Clear;Turbid;Slightly Cloudy;Cloudy    Specific Gravity, Urine 1.015 1.000 - 1.030   pH 6.5 5.0 - 8.0   Total Protein, Urine NEGATIVE Negative   Urine Glucose NEGATIVE Negative   Ketones, ur NEGATIVE Negative   Bilirubin Urine NEGATIVE Negative   Hgb urine dipstick NEGATIVE Negative   Urobilinogen, UA 0.2 0.0 - 1.0   Leukocytes,Ua TRACE (A) Negative   Nitrite NEGATIVE Negative   WBC, UA 3-6/hpf (A) 0-2/hpf   RBC / HPF none seen 0-2/hpf   Mucus, UA Presence of (A) None  Squamous Epithelial / LPF Rare(0-4/hpf) Rare(0-4/hpf)   Hyaline Casts, UA Presence of (A) None      Laura Pacas, MD

## 2022-09-28 NOTE — Assessment & Plan Note (Signed)
I have had an extensive 15 minute conversation today with the patient about healthy eating habits, exercise, calorie and carb goals for sustainable and successful weight loss. I gave the patient caloric and protein daily intake values as well as described the importance of increasing fiber and water intake. I discussed weight loss medications that could be used in the treatment of this patient. Handouts on low carb eating were given to the patient.     I explained to Laura Farrell that all of the new class weight loss drugs the GLP-1 agonists such as Ozempic Rybelsus Trulicity [employer] Ozempic bound are all not covered by Medicare unless you have a diagnosis of obesity or heart or heart disease or diabetes and because she does not have these diagnoses I do not think we can get any of these medications affordably as they are all going to run over thousand dollars and therefore we need to use less effective more side effect medications if we want to lose weight and I listed the options as metformin which causes nausea but usually if you go at a low dose it is mild to none, topiramate which causes things to taste funny I think, phentermine which would be high risk at her age because it is speed and will make the heart beat faster and I would do not recommend, Alli and fat blockers like it which run $50 or more and nobody ever stays on due to unexpected greasy diarrhea incontinence after fatty meals, I also mentioned that Wellbutrin/bupropion also compounded into Contrave for sake and separately would be reasonable although the Contrave would be too expensive.  She reported back to me that she has taken metformin and phentermine before and lost down to 140 with the phentermine prior.  I pointed out that phentermine has a bad problem of of losing its effectiveness after a little bit and then when you come off of it you gain the weight back and that is exactly what happened with her and not crazy about using it  long-term at her age but I would give it if she insists -but she feels she not like insisting at this time.  I explained that where it is a controlled substance if she changes her mind it would require another appointment for me to prescribe and we have to set up a contract which is another problem with that medicine.    After discussion of all of the above we felt that the best long-term plan would be a combination cocktail of metformin topiramate and Wellbutrin each of the low doses so that we do not get too much side effects from any of them  Encouraged her to return to her no sugar diet planning that has always worked in [employer] that I think that is a great diet lots of protein and I do recommend her to continue doing there exercise even on a weight loss plan so that she loses fat and not muscle want her to keep the muscle

## 2022-10-18 ENCOUNTER — Other Ambulatory Visit: Payer: Self-pay

## 2022-10-18 DIAGNOSIS — G250 Essential tremor: Secondary | ICD-10-CM

## 2022-10-18 MED ORDER — PROPRANOLOL HCL 10 MG PO TABS: 10.0000 mg | ORAL_TABLET | Freq: Two times a day (BID) | ORAL | 0 refills | Status: DC

## 2022-11-04 DIAGNOSIS — M25561 Pain in right knee: Secondary | ICD-10-CM | POA: Diagnosis not present

## 2022-11-04 DIAGNOSIS — M25562 Pain in left knee: Secondary | ICD-10-CM | POA: Diagnosis not present

## 2022-11-05 DIAGNOSIS — M545 Low back pain, unspecified: Secondary | ICD-10-CM | POA: Diagnosis not present

## 2022-11-05 DIAGNOSIS — M546 Pain in thoracic spine: Secondary | ICD-10-CM | POA: Diagnosis not present

## 2022-11-16 ENCOUNTER — Ambulatory Visit (INDEPENDENT_AMBULATORY_CARE_PROVIDER_SITE_OTHER): Payer: HMO | Admitting: Internal Medicine

## 2022-11-16 VITALS — BP 111/66 | HR 60 | Temp 97.4°F | Ht 64.0 in | Wt 166.8 lb

## 2022-11-16 DIAGNOSIS — N3946 Mixed incontinence: Secondary | ICD-10-CM

## 2022-11-16 DIAGNOSIS — K219 Gastro-esophageal reflux disease without esophagitis: Secondary | ICD-10-CM

## 2022-11-16 DIAGNOSIS — M1712 Unilateral primary osteoarthritis, left knee: Secondary | ICD-10-CM

## 2022-11-16 DIAGNOSIS — G44229 Chronic tension-type headache, not intractable: Secondary | ICD-10-CM

## 2022-11-16 MED ORDER — PEPCID COMPLETE 10-800-165 MG PO CHEW: 1.0000 | CHEWABLE_TABLET | Freq: Every day | ORAL | 11 refills | Status: DC | PRN

## 2022-11-16 MED ORDER — DICLOFENAC SODIUM 3 % EX GEL: 1.0000 | CUTANEOUS | 11 refills | Status: DC

## 2022-11-16 MED ORDER — OMEPRAZOLE 20 MG PO CPDR: 20.0000 mg | DELAYED_RELEASE_CAPSULE | Freq: Every day | ORAL | 3 refills | Status: DC

## 2022-11-16 NOTE — Assessment & Plan Note (Addendum)
Patients chart review and interview were used to generate a prompt for artificial intelligence analysis ([employer] artificial intelligence) clinical decision support.  AI output was reviewed and is provided in red  # Mixed Urge and Stress Urinary Incontinence  The patient, a 77 year old with a significant medical history, presents with mixed urge and stress urinary incontinence that has progressively worsened over the past 2 years. The patient's history of a complete hysterectomy with bilateral oophorectomy, well-controlled hypothyroidism, prediabetes, bilateral hydronephrosis, and a past lumbar bulging disk surgery at L4-L5 are notable. The absence of bladder pain, history of urinary tract infections, and dyspareunia, combined with the symptoms of urinary frequency and urgency, suggest a multifactorial etiology for the incontinence. The differential diagnosis includes neurogenic bladder, overactive bladder (OAB), urinary tract obstruction, interstitial cystitis/bladder pain syndrome (IC/BPS), diabetes insipidus (DI), vesicovaginal fistula, pelvic floor dysfunction, detrusor sphincter dyssynergia, urethral diverticulum, atrophic urethritis/vaginitis, medication side effects, and chronic urinary retention.  ### Dx Diagnostic studies to be ordered include: - Urinalysis and urine culture to rule out infection. - Serum glucose and HbA1c to assess for diabetes mellitus. - Renal function tests including serum creatinine and electrolytes. - Post-void residual volume to evaluate for incomplete bladder emptying. - Urodynamic studies to assess bladder function and rule out neurogenic bladder. - Pelvic ultrasound or CT scan to evaluate for anatomical abnormalities contributing to hydronephrosis. - Cystoscopy to inspect the bladder and urethra for structural abnormalities. - Neurological examination focusing on lower extremity reflexes and sensory changes.  ### Tx Treatment options for the most likely  diagnosis of mixed urge and stress urinary incontinence include: - Pelvic floor muscle exercises (Kegel exercises) to strengthen the pelvic floor. - Bladder training techniques to manage urge incontinence. - Anticholinergic medications or beta-3 agonists for overactive bladder symptoms. - Topical estrogen therapy if atrophic vaginitis is contributing to symptoms. - Intermittent catheterization if there is evidence of chronic urinary retention. - Consideration of pessary use if pelvic organ prolapse is identified. - Surgical options may be considered if conservative treatments fail and a clear structural cause is identified. - Management of comorbid conditions such as optimizing glycemic control in prediabetes.  No need for UA since we have UA since symptom(s) started No need for topical estrogen due to no dyspareunia but urogynecology can evaluate further Encouraged continuing with current anticholinergics Considering b3 agonists such as mirabegron Suspect(s) she needs pessary trial first -sending for fitting Hemoglobin A1c and TSH already neg

## 2022-11-16 NOTE — Assessment & Plan Note (Signed)
Taper attempt led to worsening reflux and this was a major concern today Uncontrolled. Resume omeprazole @ 20 mg daily, and try using pepcid complete as needed, resent both

## 2022-11-16 NOTE — Progress Notes (Signed)
[facility]: [phone]   Routine Medical Office Visit  Patient:  Laura Farrell      Age: 77 y.o.       Sex:  female  Date:   11/16/2022  PCP:    Loralee Pacas, [place] Provider: Loralee Pacas, MD   Problem Focused Charting:   Medical Decision Making per Assessment/Plan    Laura Farrell was seen today for 6-7 week follow-up.  Gastroesophageal reflux disease without esophagitis Overview: She saw her gastroenterologist November 2023 and they were supportive of my recommendation to attempt taper of omeprazole for her kidneys Dr. Deliah Goody, PA-C. She has now picked up famotidine and is about to start the tapering course  Assessment & Plan: Taper attempt led to worsening reflux and this was a major concern today Uncontrolled. Resume omeprazole @ 20 mg daily, and try using pepcid complete as needed, resent both  Orders: -     Omeprazole; Take 1 capsule (20 mg total) by mouth daily. Use daily to eliminate heartburn all day.  Dispense: 30 capsule; Refill: 3 -     Pepcid Complete; Chew 1 tablet by mouth daily as needed. Use for as needed heartburn only  Dispense: 100 tablet; Refill: 11  Mixed stress and urge urinary incontinence Overview: 77 year old female who complete hysterectomy and both ovaries removed around 30 years ago Mixed urge and urinary incontinence has worsened over past 2 years, forcing her to wear a pad all the time. Denies bladder aching, never gets UTI, denies dyspareunia; Has to urinate every hour or more- frequency and often can't make it to the restroom.  Had surgical repair lumbar bulging disk l4-l5 10-15 years ago and back has been not bothering her much since then.  She has hypothyroidism that is well controlled She has prediabetes She has bilateral hydronephrosis, and history of single kidney stone passed 10 years ago.   Assessment & Plan: Patients chart review and interview were used to generate a prompt for  artificial intelligence analysis ([employer] artificial intelligence) clinical decision support.  AI output was reviewed and is provided in red  # Mixed Urge and Stress Urinary Incontinence  The patient, a 77 year old with a significant medical history, presents with mixed urge and stress urinary incontinence that has progressively worsened over the past 2 years. The patient's history of a complete hysterectomy with bilateral oophorectomy, well-controlled hypothyroidism, prediabetes, bilateral hydronephrosis, and a past lumbar bulging disk surgery at L4-L5 are notable. The absence of bladder pain, history of urinary tract infections, and dyspareunia, combined with the symptoms of urinary frequency and urgency, suggest a multifactorial etiology for the incontinence. The differential diagnosis includes neurogenic bladder, overactive bladder (OAB), urinary tract obstruction, interstitial cystitis/bladder pain syndrome (IC/BPS), diabetes insipidus (DI), vesicovaginal fistula, pelvic floor dysfunction, detrusor sphincter dyssynergia, urethral diverticulum, atrophic urethritis/vaginitis, medication side effects, and chronic urinary retention.  ### Dx Diagnostic studies to be ordered include: - Urinalysis and urine culture to rule out infection. - Serum glucose and HbA1c to assess for diabetes mellitus. - Renal function tests including serum creatinine and electrolytes. - Post-void residual volume to evaluate for incomplete bladder emptying. - Urodynamic studies to assess bladder function and rule out neurogenic bladder. - Pelvic ultrasound or CT scan to evaluate for anatomical abnormalities contributing to hydronephrosis. - Cystoscopy to inspect the bladder and urethra for structural abnormalities. - Neurological examination focusing on lower extremity reflexes and sensory changes.  ### Tx Treatment options for the most likely diagnosis of  mixed urge and stress urinary incontinence include: - Pelvic  floor muscle exercises (Kegel exercises) to strengthen the pelvic floor. - Bladder training techniques to manage urge incontinence. - Anticholinergic medications or beta-3 agonists for overactive bladder symptoms. - Topical estrogen therapy if atrophic vaginitis is contributing to symptoms. - Intermittent catheterization if there is evidence of chronic urinary retention. - Consideration of pessary use if pelvic organ prolapse is identified. - Surgical options may be considered if conservative treatments fail and a clear structural cause is identified. - Management of comorbid conditions such as optimizing glycemic control in prediabetes.      Orders: -     Pessary/other intravaginal support device insert/fit; Future -     Ambulatory referral to Urogynecology  Chronic tension-type headache, not intractable Overview: Rare now, prefers to avoid meds   Primary osteoarthritis of left knee Overview: The pain is in the knees but she does have arthritis in other joints including her back  Orders: -     Diclofenac Sodium; Apply 1 Application topically every 4 (four) hours.  Dispense: 100 g; Refill: 11       Subjective - Clinical Presentation:   Laura Farrell is a 77 y.o. female  Patient Active Problem List   Diagnosis Date Noted   Overweight 09/28/2022   Hiatal hernia 09/10/2022   Right kidney mass 09/07/2022   Family history of pancreatic cancer 09/07/2022   Anemia in chronic kidney disease (CKD) 08/17/2022   CKD (chronic kidney disease) stage 3, GFR 30-59 ml/min ([place]) 07/16/2022   Cerumen debris on tympanic membrane 07/16/2022   Hyperkalemia 06/21/2022   Acute kidney injury ([place]) 06/21/2022   Acute thoracic back pain 06/11/2022   Lumbar spondylosis 06/11/2022   Cellulitis 06/11/2022   Hearing loss 06/11/2022   History of back surgery 06/11/2022   Screening for osteoporosis 04/07/2021   Mixed stress and urge urinary incontinence 12/15/2020   Rosacea 06/10/2020    Prediabetes 12/27/2019   Tubular adenoma of colon 12/27/2019   Osteoarthritis of left knee 04/10/2019   Pain in left knee 11/22/2018   Chronic tension headaches 08/14/2018   Menopausal hot flushes 08/14/2018   Family history of breast cancer in sister 08/14/2018   Acquired spondylolisthesis 02/16/2017   Tremor 02/16/2017   Lumbar radiculopathy 02/16/2017   At high risk for breast cancer 09/13/2016   Bilateral hydronephrosis 09/10/2013   Displacement of lumbar intervertebral disc without myelopathy 04/03/2013   GERD (gastroesophageal reflux disease) 06/28/2012   Hyperlipidemia 06/28/2012   Hypothyroidism 06/28/2012   History of cluster headache 06/28/2012   OSA (obstructive sleep apnea) 05/24/2012   HLP (hyperkeratosis lenticularis perstans)    Essential tremor 03/20/2012   Status post corneal transplant 11/06/2009   History of penetrating keratoplasty 11/06/2009   Past Medical History:  Diagnosis Date   Acute kidney injury ([place]) 06/21/2022   Cerumen debris on tympanic membrane 07/16/2022   Chronic tension headaches 08/14/2018   CKD (chronic kidney disease) stage 3, GFR 30-59 ml/min ([place]) 07/16/2022   Diabetes mellitus    Headache(784.0)    Hearing loss 06/11/2022    She follows with hearing study in trial [facility] She often doesn't wear the hearing aids since her hearing is decent.   History of back surgery 06/11/2022   Surgery 2005ish helped a lot, but still has pain up above the low back in her back.   History of colon polyps 08/17/2022   Has really just records but difficult to find them in the chart. Last colonoscopy around  August to September 2023 was completely negative for any polyps and she was advised no further follow-up would be needed by patient report The previous colonoscopy before that had 2 polyps in 2015 and she was advised to 7-year follow-up History of anal fissure   HLP (hyperkeratosis lenticularis perstans)    Hot flashes    Hyperlipidemia     Menopausal hot flushes 08/14/2018   On effexor and gabapentin   Nephrolithiasis    Over weight    Overweight 09/28/2022   Wt Readings from Last 50 Encounters: 09/28/22 170 lb 12.8 oz (77.5 kg) 09/20/22 173 lb 3.2 oz (78.6 kg) 09/07/22 173 lb 6.4 oz (78.7 kg) 08/17/22 171 lb 12.8 oz (77.9 kg) 07/16/22 165 lb 12.8 oz (75.2 kg) 06/11/22 160 lb 6.4 oz (72.8 kg) 05/26/22 161 lb 6.4 oz (73.2 kg) 05/13/22 161 lb (73 kg) 03/11/22 154 lb 9.6 oz (70.1 kg) 01/18/22 151 lb 9.6 oz (68.8 kg) 01/13/22 148 lb 9.6 oz (67.4   Thyroid disease    Tubular adenoma of colon 12/27/2019   Colonoscopy, Dr. Cristina Gong, 02/2015: tubular adenoma, 5 year recall   Urine incontinence     Outpatient Medications Prior to Visit  Medication Sig   aspirin 81 MG tablet Take 81 mg by mouth daily.   atorvastatin (LIPITOR) 80 MG tablet Take 1 tablet (80 mg total) by mouth daily.   buPROPion ER (WELLBUTRIN SR) 100 MG 12 hr tablet Take 1 tablet (100 mg total) by mouth in the morning.   carbamide peroxide (DEBROX) 6.5 % OTIC solution Place 5 drops into both ears 2 (two) times daily.   cephALEXin (KEFLEX) 250 MG capsule 1 capsule every 6 hours by oral route.   CREAM BASE EX Apply topically 2 (two) times daily.   cyclobenzaprine (FLEXERIL) 10 MG tablet TAKE ONE TABLET BY MOUTH THREE TIMES DAILY AS NEEDED FOR MUSCLE SPASMS   diclofenac (VOLTAREN) 75 MG EC tablet Take 1 tablet (75 mg total) by mouth 2 (two) times daily as needed.   diclofenac Sodium (VOLTAREN) 1 % GEL Apply 2 g topically 4 (four) times daily.   doxycycline (VIBRA-TABS) 100 MG tablet Take 1 tablet (100 mg total) by mouth 2 (two) times daily.   empagliflozin (JARDIANCE) 10 MG TABS tablet Take 1 tablet (10 mg total) by mouth daily before breakfast.   gabapentin (NEURONTIN) 600 MG tablet TAKE ONE TABLET BY MOUTH TWICE DAILY   Iron, Ferrous Sulfate, 325 (65 Fe) MG TABS Take 325 mg by mouth daily.   levothyroxine (SYNTHROID) 100 MCG tablet Take 1 tablet (100 mcg total)  by mouth daily.   loratadine (CLARITIN) 10 MG tablet Take 1 tablet (10 mg total) by mouth daily.   metFORMIN (GLUCOPHAGE) 500 MG tablet Take 1 tablet (500 mg total) by mouth 2 (two) times daily with a meal.   methocarbamol (ROBAXIN) 500 MG tablet Take 1 tablet (500 mg total) by mouth 3 (three) times daily.   metroNIDAZOLE (METROGEL) 0.75 % gel Apply 1 Application topically 2 (two) times daily.   neomycin-polymyxin b-dexamethasone (MAXITROL) 3.5-10000-0.1 OINT Place into the left eye 4 times daily.   phentermine (ADIPEX-P) 37.5 MG tablet TAKE 1/2-1 TABLET BY MOUTH DAILY AS NEEDED   prednisoLONE acetate (PRED FORTE) 1 % ophthalmic suspension PLACE 1 DROP INTO LEFT EYE DAILY   primidone (MYSOLINE) 50 MG tablet TAKE 4 TABLETS BY MOUTH EVERY MORNING AND TWO TABLETS AT NIGHT   propranolol (INDERAL) 10 MG tablet Take 1 tablet (10 mg total) by mouth 2 (two)  times daily. 10 mg twice per day   solifenacin (VESICARE) 10 MG tablet Take 1 tablet (10 mg total) by mouth daily.   Sulfacetamide Sodium-Sulfur (PLEXION) 9.8-4.8 % CREA Apply to affected areas twice daily   tirzepatide [facility]) 2.5 MG/0.5ML Pen Inject 2.5 mg into the skin once a week.   topiramate (TOPAMAX) 25 MG tablet Take 1 tablet (25 mg total) by mouth 2 (two) times daily.   traMADol (ULTRAM) 50 MG tablet Take 1 tablet (50 mg total) by mouth every 6 (six) hours as needed.   triamcinolone cream (KENALOG) 0.1 %    valACYclovir (VALTREX) 1000 MG tablet Take 1 tablet (1,000 mg total) by mouth 3 (three) times daily. For 7 days for shingles.   venlafaxine XR (EFFEXOR-XR) 37.5 MG 24 hr capsule TAKE ONE CAPSULE BY MOUTH EVERY DAY   [DISCONTINUED] Diclofenac Sodium 3 % GEL Apply 1 Application topically daily in the afternoon.   [DISCONTINUED] famotidine-calcium carbonate-magnesium hydroxide (PEPCID COMPLETE) 10-800-165 MG chewable tablet Chew 1 tablet by mouth daily as needed.   [DISCONTINUED] omeprazole (PRILOSEC) 20 MG capsule Take 1 capsule (20 mg  total) by mouth daily.   No facility-administered medications prior to visit.    Chief Complaint  Patient presents with   6-7 week follow-up    Requests something stronger for bladder and for indigestion(pantaprazole 40 mg).    HPI   Worsening gastritis after omeprazole taper Worsening urinary incontinence last couple years would like addressed Would like try to get Voltaren 3% again, insurance change       Objective:  Physical Exam  BP 111/66 (BP Location: Left Arm, Patient Position: Sitting)   Pulse 60   Temp (!) 97.4 F (36.3 C) (Temporal)   Ht '5\' 4"'$  (1.626 m)   Wt 166 lb 12.8 oz (75.7 kg)   SpO2 100%   BMI 28.63 kg/m   Overweight  by BMI criteria but truncal adiposity (waist circumference or caliper) should be used instead. Wt Readings from Last 10 Encounters:  11/16/22 166 lb 12.8 oz (75.7 kg)  09/28/22 170 lb 12.8 oz (77.5 kg)  09/20/22 173 lb 3.2 oz (78.6 kg)  09/07/22 173 lb 6.4 oz (78.7 kg)  08/17/22 171 lb 12.8 oz (77.9 kg)  07/16/22 165 lb 12.8 oz (75.2 kg)  06/11/22 160 lb 6.4 oz (72.8 kg)  05/26/22 161 lb 6.4 oz (73.2 kg)  05/13/22 161 lb (73 kg)  03/11/22 154 lb 9.6 oz (70.1 kg)   Vital signs reviewed.  Nursing notes reviewed. Weight trend reviewed. General Appearance:  Well developed, well nourished female in no acute distress.   Normal work of breathing at rest Musculoskeletal: All extremities are intact.  Neurological:  Awake, alert,  No obvious focal neurological deficits or cognitive impairments Psychiatric:  Appropriate mood, pleasant demeanor Problem-specific findings:  incredibly kind sweet and polite, as always.         Signed: Loralee Pacas, MD 11/16/2022 12:49 PM

## 2022-11-17 ENCOUNTER — Telehealth: Payer: Self-pay | Admitting: Internal Medicine

## 2022-11-17 NOTE — Telephone Encounter (Signed)
Patient requests to be called regarding referral that was sent and a medication that Patient received a letter from [facility] stating they will not fill Rx for anymore

## 2022-11-19 NOTE — Telephone Encounter (Signed)
Pt is calling back about the previous message. Please call pt back with details.

## 2022-11-23 NOTE — Telephone Encounter (Signed)
Spoke with patient concerning this and let her know that she is approved for [facility].

## 2022-12-07 ENCOUNTER — Other Ambulatory Visit: Payer: Self-pay | Admitting: Family Medicine

## 2022-12-14 ENCOUNTER — Ambulatory Visit (INDEPENDENT_AMBULATORY_CARE_PROVIDER_SITE_OTHER): Payer: HMO | Admitting: Internal Medicine

## 2022-12-14 ENCOUNTER — Encounter: Payer: Self-pay | Admitting: Internal Medicine

## 2022-12-14 VITALS — BP 102/68 | HR 60 | Temp 97.7°F | Ht 64.0 in | Wt 161.8 lb

## 2022-12-14 DIAGNOSIS — R7303 Prediabetes: Secondary | ICD-10-CM | POA: Diagnosis not present

## 2022-12-14 DIAGNOSIS — K219 Gastro-esophageal reflux disease without esophagitis: Secondary | ICD-10-CM | POA: Diagnosis not present

## 2022-12-14 DIAGNOSIS — H66002 Acute suppurative otitis media without spontaneous rupture of ear drum, left ear: Secondary | ICD-10-CM | POA: Diagnosis not present

## 2022-12-14 DIAGNOSIS — N3000 Acute cystitis without hematuria: Secondary | ICD-10-CM

## 2022-12-14 DIAGNOSIS — E782 Mixed hyperlipidemia: Secondary | ICD-10-CM

## 2022-12-14 DIAGNOSIS — N3946 Mixed incontinence: Secondary | ICD-10-CM

## 2022-12-14 DIAGNOSIS — N281 Cyst of kidney, acquired: Secondary | ICD-10-CM | POA: Diagnosis not present

## 2022-12-14 DIAGNOSIS — R102 Pelvic and perineal pain: Secondary | ICD-10-CM

## 2022-12-14 DIAGNOSIS — M1712 Unilateral primary osteoarthritis, left knee: Secondary | ICD-10-CM

## 2022-12-14 DIAGNOSIS — E039 Hypothyroidism, unspecified: Secondary | ICD-10-CM

## 2022-12-14 LAB — CBC
HCT: 41.4 % (ref 36.0–46.0)
Hemoglobin: 13.7 g/dL (ref 12.0–15.0)
MCHC: 33 g/dL (ref 30.0–36.0)
MCV: 88.2 fl (ref 78.0–100.0)
Platelets: 183 10*3/uL (ref 150.0–400.0)
RBC: 4.69 Mil/uL (ref 3.87–5.11)
RDW: 17.7 % — ABNORMAL HIGH (ref 11.5–15.5)
WBC: 5.6 10*3/uL (ref 4.0–10.5)

## 2022-12-14 LAB — URINALYSIS, ROUTINE W REFLEX MICROSCOPIC
Bilirubin Urine: NEGATIVE
Hgb urine dipstick: NEGATIVE
Ketones, ur: NEGATIVE
Nitrite: POSITIVE — AB
Specific Gravity, Urine: 1.02 (ref 1.000–1.030)
Total Protein, Urine: NEGATIVE
Urine Glucose: >=1000 — AB
Urobilinogen, UA: 0.2 (ref 0.0–1.0)
pH: 6 (ref 5.0–8.0)

## 2022-12-14 LAB — COMPREHENSIVE METABOLIC PANEL
ALT: 26 U/L (ref 0–35)
AST: 26 U/L (ref 0–37)
Albumin: 4.4 g/dL (ref 3.5–5.2)
Alkaline Phosphatase: 81 U/L (ref 39–117)
BUN: 16 mg/dL (ref 6–23)
CO2: 25 mEq/L (ref 19–32)
Calcium: 9.9 mg/dL (ref 8.4–10.5)
Chloride: 105 mEq/L (ref 96–112)
Creatinine, Ser: 1 mg/dL (ref 0.40–1.20)
GFR: 53.77 mL/min — ABNORMAL LOW (ref >60.00)
Glucose, Bld: 80 mg/dL (ref 70–99)
Potassium: 4.1 mEq/L (ref 3.5–5.1)
Sodium: 141 mEq/L (ref 135–145)
Total Bilirubin: 0.2 mg/dL (ref 0.2–1.2)
Total Protein: 7.2 g/dL (ref 6.0–8.3)

## 2022-12-14 LAB — LIPID PANEL
Cholesterol: 154 mg/dL (ref 0–200)
HDL: 46.2 mg/dL (ref >39.00)
LDL Cholesterol: 86 mg/dL (ref 0–99)
NonHDL: 107.4
Total CHOL/HDL Ratio: 3
Triglycerides: 105 mg/dL (ref 0.0–149.0)
VLDL: 21 mg/dL (ref 0.0–40.0)

## 2022-12-14 LAB — HEMOGLOBIN A1C: Hgb A1c MFr Bld: 6.2 % (ref 4.6–6.5)

## 2022-12-14 LAB — TSH: TSH: 1.82 u[IU]/mL (ref 0.35–5.50)

## 2022-12-14 MED ORDER — AMOXICILLIN-POT CLAVULANATE 875-125 MG PO TABS: 1.0000 | ORAL_TABLET | Freq: Two times a day (BID) | ORAL | 0 refills | Status: DC

## 2022-12-14 NOTE — Assessment & Plan Note (Addendum)
Patient reports improvement perhaps due to vesicare adjustment Encouraged continuing with  [facility] appointment.  Down from q1-q4h urgency.   Has bladder fullness today so get urinalysis Encouraged continuing with vesicare  She discontinued azo 2 days ago.

## 2022-12-14 NOTE — Assessment & Plan Note (Signed)
Gastroesophageal reflux disease resolved with resume omeprazole Advised continue with that for now but return to clinic 1 month to come off omeprazole

## 2022-12-14 NOTE — Progress Notes (Signed)
[facility]: [phone]   Routine Medical Office Visit  Patient:  Laura Farrell      Age: 77 y.o.       Sex:  female  Date:   12/14/2022  PCP:    Loralee Pacas, [place] Provider: Loralee Pacas, MD   Assessment and Plan:   Laura Farrell was seen today for 1 month follow-up, otalgia, sore throat, urinary frequency and bloated.  Non-recurrent acute suppurative otitis media of left ear without spontaneous rupture of tympanic membrane -     Amoxicillin-Pot Clavulanate; Take 1 tablet by mouth 2 (two) times daily.  Dispense: 20 tablet; Refill: 0  Gastroesophageal reflux disease without esophagitis Overview: She saw her gastroenterologist November 2023 and they were supportive of my recommendation to attempt taper of omeprazole for her kidneys Dr. Deliah Goody, PA-C. She has now picked up famotidine and is about to start the tapering course  Assessment & Plan: Gastroesophageal reflux disease resolved with resume omeprazole Advised continue with that for now but return to clinic 1 month to come off omeprazole   Mixed stress and urge urinary incontinence Overview: complete hysterectomy and both ovaries removed around 30 years ago Mixed urge and urinary incontinence has worsened over past 2 years, forcing her to wear a pad all the time. Not associated with urinary tract infection (UTI) or dyspareunia Had surgical repair lumbar bulging disk l4-l5 10-15 years ago and back has been not bothering her much since then.  She has hypothyroidism that is well controlled She has prediabetes She has bilateral hydronephrosis, and benign renal cysts and history of single kidney stone passed 10 years ago.   Assessment & Plan: Patient reports improvement perhaps due to vesicare adjustment Encouraged continuing with  [facility] appointment.  Down from q1-q4h urgency.   Has bladder fullness today so get urinalysis Encouraged continuing with  vesicare  She discontinued azo 2 days ago.  Orders: -     Urinalysis, Routine w reflex microscopic  Primary osteoarthritis of left knee Overview: The pain is in the knees but she does have arthritis in other joints including her back  Assessment & Plan: Injected elsewhere February 2024 and pain way down from Dr. Theda Sers   Prediabetes Overview: Lab Results  Component Value Date   HGBA1C 6.1 11/04/2021    Orders: -     CBC; Future -     Comprehensive metabolic panel; Future -     Hemoglobin A1c; Future -     Lipid panel; Future  Acquired hypothyroidism Overview: Lab Results  Component Value Date/Time   TSH 0.25 (L) 06/11/2022 11:19 AM   TSH 4.01 11/04/2021 12:20 PM   TSH 2.06 12/15/2020 08:34 AM   TSH 2.36 12/27/2019 09:10 AM   TSH 2.76 12/21/2018 10:16 AM    Orders: -     TSH; Future  Suprapubic discomfort -     Urinalysis, Routine w reflex microscopic  Mixed hyperlipidemia  Acquired renal cyst of right kidney Overview: MRI 09/2022 showed benign:  1. Lesion of interest in the superior pole of the right kidney consists of two adjacent cysts or a single cyst with a thick, enhancing internal septation, overall dimensions 2.0 x 1.3 cm. Although conservatively, this lesion could be characterized as a Bosniak category III lesion, this is unchanged in comparison to relatively remote examination dated 11/01/2013 and is most likely to adjacent simple, benign cysts (Bosniak category I), and no further follow-up or characterization is required given  well-established long-term stability. 2. Additional simple, benign renal cortical cysts and a thinly septated, benign cyst of the inferior pole of the right kidney, likewise not significantly changed compared to prior examination dated 2015, benign, and no further follow-up or characterization is required.            Clinical Presentation:   The patient is a 77 y.o. female: Active Ambulatory Problems     Diagnosis Date Noted   Essential tremor 03/20/2012   HLP (hyperkeratosis lenticularis perstans)    OSA (obstructive sleep apnea) 05/24/2012   GERD (gastroesophageal reflux disease) 06/28/2012   Hyperlipidemia 06/28/2012   Hypothyroidism 06/28/2012   History of cluster headache 06/28/2012   Bilateral hydronephrosis 09/10/2013   At high risk for breast cancer 09/13/2016   Chronic tension headaches 08/14/2018   Menopausal hot flushes 08/14/2018   Family history of breast cancer in sister 08/14/2018   Osteoarthritis of left knee 04/10/2019   Prediabetes 12/27/2019   Status post corneal transplant 11/06/2009   Tubular adenoma of colon 12/27/2019   Rosacea 06/10/2020   Mixed stress and urge urinary incontinence 12/15/2020   Screening for osteoporosis 04/07/2021   Lumbar radiculopathy 02/16/2017   Hearing loss 06/11/2022   History of back surgery 06/11/2022   Hyperkalemia 06/21/2022   CKD (chronic kidney disease) stage 3, GFR 30-59 ml/min (HCC) 07/16/2022   Anemia in chronic kidney disease (CKD) 08/17/2022   Acquired renal cyst of right kidney 09/07/2022   Family history of pancreatic cancer 09/07/2022   Hiatal hernia 09/10/2022   History of penetrating keratoplasty 11/06/2009   Overweight 09/28/2022   Resolved Ambulatory Problems    Diagnosis Date Noted   Bronchitis, acute 09/12/2014   Diet-controlled type 2 diabetes mellitus ([place]) 02/12/2015   Hot flashes 03/12/2015   Pain in left knee 11/22/2018   Acquired spondylolisthesis 02/16/2017   Acute thoracic back pain 06/11/2022   Displacement of lumbar intervertebral disc without myelopathy 04/03/2013   Lumbar spondylosis 06/11/2022   Tremor 02/16/2017   Cellulitis 06/11/2022   Acute kidney injury ([place]) 06/21/2022   Cerumen debris on tympanic membrane 07/16/2022   History of colon polyps 08/17/2022   Past Medical History:  Diagnosis Date   Diabetes mellitus    Headache(784.0)    Nephrolithiasis    Over weight    Thyroid  disease    Urine incontinence     Outpatient Medications Prior to Visit  Medication Sig   aspirin 81 MG tablet Take 81 mg by mouth daily.   atorvastatin (LIPITOR) 80 MG tablet Take 1 tablet (80 mg total) by mouth daily.   buPROPion ER (WELLBUTRIN SR) 100 MG 12 hr tablet Take 1 tablet (100 mg total) by mouth in the morning.   carbamide peroxide (DEBROX) 6.5 % OTIC solution Place 5 drops into both ears 2 (two) times daily.   cephALEXin (KEFLEX) 250 MG capsule 1 capsule every 6 hours by oral route.   CREAM BASE EX Apply topically 2 (two) times daily.   cyclobenzaprine (FLEXERIL) 10 MG tablet TAKE ONE TABLET BY MOUTH THREE TIMES DAILY AS NEEDED FOR MUSCLE SPASMS   diclofenac (VOLTAREN) 75 MG EC tablet TAKE 1 TABLET BY MOUTH TWO TIMES A DAY AS NEEDED   diclofenac Sodium (VOLTAREN) 1 % GEL Apply 2 g topically 4 (four) times daily.   Diclofenac Sodium 3 % GEL Apply 1 Application topically every 4 (four) hours.   doxycycline (VIBRA-TABS) 100 MG tablet Take 1 tablet (100 mg total) by mouth 2 (two) times daily.  empagliflozin (JARDIANCE) 10 MG TABS tablet Take 1 tablet (10 mg total) by mouth daily before breakfast.   famotidine-calcium carbonate-magnesium hydroxide (PEPCID COMPLETE) 10-800-165 MG chewable tablet Chew 1 tablet by mouth daily as needed. Use for as needed heartburn only   gabapentin (NEURONTIN) 600 MG tablet TAKE ONE TABLET BY MOUTH TWICE DAILY   Iron, Ferrous Sulfate, 325 (65 Fe) MG TABS Take 325 mg by mouth daily.   levothyroxine (SYNTHROID) 100 MCG tablet Take 1 tablet (100 mcg total) by mouth daily.   loratadine (CLARITIN) 10 MG tablet Take 1 tablet (10 mg total) by mouth daily.   metFORMIN (GLUCOPHAGE) 500 MG tablet Take 1 tablet (500 mg total) by mouth 2 (two) times daily with a meal.   methocarbamol (ROBAXIN) 500 MG tablet Take 1 tablet (500 mg total) by mouth 3 (three) times daily.   metroNIDAZOLE (METROGEL) 0.75 % gel Apply 1 Application topically 2 (two) times daily.    neomycin-polymyxin b-dexamethasone (MAXITROL) 3.5-10000-0.1 OINT Place into the left eye 4 times daily.   omeprazole (PRILOSEC) 20 MG capsule Take 1 capsule (20 mg total) by mouth daily. Use daily to eliminate heartburn all day.   phentermine (ADIPEX-P) 37.5 MG tablet TAKE 1/2-1 TABLET BY MOUTH DAILY AS NEEDED   prednisoLONE acetate (PRED FORTE) 1 % ophthalmic suspension PLACE 1 DROP INTO LEFT EYE DAILY   primidone (MYSOLINE) 50 MG tablet TAKE 4 TABLETS BY MOUTH EVERY MORNING AND TWO TABLETS AT NIGHT   propranolol (INDERAL) 10 MG tablet Take 1 tablet (10 mg total) by mouth 2 (two) times daily. 10 mg twice per day   rosuvastatin (CRESTOR) 40 MG tablet    solifenacin (VESICARE) 10 MG tablet Take 1 tablet (10 mg total) by mouth daily.   Sulfacetamide Sodium-Sulfur (PLEXION) 9.8-4.8 % CREA Apply to affected areas twice daily   tirzepatide [facility]) 2.5 MG/0.5ML Pen Inject 2.5 mg into the skin once a week.   topiramate (TOPAMAX) 25 MG tablet Take 1 tablet (25 mg total) by mouth 2 (two) times daily.   traMADol (ULTRAM) 50 MG tablet Take 1 tablet (50 mg total) by mouth every 6 (six) hours as needed.   triamcinolone cream (KENALOG) 0.1 %    valACYclovir (VALTREX) 1000 MG tablet Take 1 tablet (1,000 mg total) by mouth 3 (three) times daily. For 7 days for shingles.   venlafaxine XR (EFFEXOR-XR) 37.5 MG 24 hr capsule TAKE ONE CAPSULE BY MOUTH EVERY DAY   No facility-administered medications prior to visit.     Chief Complaint  Patient presents with   1 month follow-up   Otalgia    Left ear only for a week. Started after the sore throat.   Sore Throat    For a week, on the left side only.   Urinary Frequency    For a week.   Bloated    Lower abdomen    HPI  Severe pain left ear, 1 day, but sore throat x 1 week left side only.  No white on ear. Patient denies hearing loss, patient reports odynophagia.  Patient reports systemic signs and symptoms such as fever, myalgias, malaise, fatigue, chills.    Also bladder fullness, stopped azo 2 days ago,  urinary incontinence is improved from last month, maybe due to vesicare dose change  Gastroesophageal reflux disease better with omeprazole, she'd still like to taper again.         Clinical Data Analysis:  Physical Exam  BP 102/68 (BP Location: Left Arm, Patient Position: Sitting)  Pulse 60   Temp 97.7 F (36.5 C) (Temporal)   Ht '5\' 4"'$  (1.626 m)   Wt 161 lb 12.8 oz (73.4 kg)   SpO2 100%   BMI 27.77 kg/m  Wt Readings from Last 10 Encounters:  12/14/22 161 lb 12.8 oz (73.4 kg)  11/16/22 166 lb 12.8 oz (75.7 kg)  09/28/22 170 lb 12.8 oz (77.5 kg)  09/20/22 173 lb 3.2 oz (78.6 kg)  09/07/22 173 lb 6.4 oz (78.7 kg)  08/17/22 171 lb 12.8 oz (77.9 kg)  07/16/22 165 lb 12.8 oz (75.2 kg)  06/11/22 160 lb 6.4 oz (72.8 kg)  05/26/22 161 lb 6.4 oz (73.2 kg)  05/13/22 161 lb (73 kg)   Vital signs reviewed.  Nursing notes reviewed. Weight trend reviewed. Abnormalities noted: none  Overweight  by BMI criteria is noted but in my medical opinion, BMI is an unreliable indicator of healthy body composition due to its inability to reflect lean muscle mass.  General Appearance:  Well developed, well nourished female in no acute distress.   Pulmonary:  Normal work of breathing at rest, no respiratory distress apparent. SpO2: 100 %  Musculoskeletal: All extremities are intact.  Neurological:  Awake, alert. No obvious focal neurological deficits or cognitive impairments.  Sensorium seems unclouded. Psychiatric:  Appropriate mood, pleasant demeanor Problem-specific findings:  redness throat. Pus left ear. Anterior behind tm        Signed: Loralee Pacas, MD 12/14/2022 10:02 AM

## 2022-12-14 NOTE — Assessment & Plan Note (Signed)
Injected elsewhere February 2024 and pain way down from Dr. Theda Sers

## 2022-12-14 NOTE — Addendum Note (Signed)
Addended by: Loralee Pacas on: 12/14/2022 06:57 PM   Modules accepted: Orders

## 2022-12-23 ENCOUNTER — Telehealth: Payer: Self-pay | Admitting: Internal Medicine

## 2022-12-23 NOTE — Telephone Encounter (Signed)
Patient states: - Following OV on 3/5, amoxicillin helped symptoms - Once finished with abx, symptoms returned and worsened  - Would like more abx  Please Advise.

## 2022-12-29 NOTE — Telephone Encounter (Signed)
Spoke with patient concerning this. She stated that she is feeling better now.

## 2023-01-04 ENCOUNTER — Ambulatory Visit: Payer: PPO | Admitting: Physician Assistant

## 2023-01-06 ENCOUNTER — Other Ambulatory Visit: Payer: Self-pay | Admitting: Internal Medicine

## 2023-01-06 DIAGNOSIS — E611 Iron deficiency: Secondary | ICD-10-CM

## 2023-01-12 DIAGNOSIS — Z6826 Body mass index (BMI) 26.0-26.9, adult: Secondary | ICD-10-CM | POA: Diagnosis not present

## 2023-01-12 DIAGNOSIS — M546 Pain in thoracic spine: Secondary | ICD-10-CM | POA: Diagnosis not present

## 2023-01-19 DIAGNOSIS — Z947 Corneal transplant status: Secondary | ICD-10-CM | POA: Diagnosis not present

## 2023-01-19 DIAGNOSIS — H43811 Vitreous degeneration, right eye: Secondary | ICD-10-CM | POA: Diagnosis not present

## 2023-01-19 DIAGNOSIS — Z961 Presence of intraocular lens: Secondary | ICD-10-CM | POA: Diagnosis not present

## 2023-01-21 DIAGNOSIS — M5414 Radiculopathy, thoracic region: Secondary | ICD-10-CM | POA: Diagnosis not present

## 2023-01-31 DIAGNOSIS — Z961 Presence of intraocular lens: Secondary | ICD-10-CM | POA: Diagnosis not present

## 2023-01-31 DIAGNOSIS — Z947 Corneal transplant status: Secondary | ICD-10-CM | POA: Diagnosis not present

## 2023-01-31 DIAGNOSIS — H43811 Vitreous degeneration, right eye: Secondary | ICD-10-CM | POA: Diagnosis not present

## 2023-02-01 ENCOUNTER — Other Ambulatory Visit: Payer: Self-pay | Admitting: Neurology

## 2023-02-01 ENCOUNTER — Other Ambulatory Visit: Payer: Self-pay | Admitting: Family Medicine

## 2023-02-01 DIAGNOSIS — G250 Essential tremor: Secondary | ICD-10-CM

## 2023-02-02 ENCOUNTER — Ambulatory Visit: Payer: HMO | Admitting: Neurology

## 2023-02-03 ENCOUNTER — Telehealth: Payer: Self-pay | Admitting: Internal Medicine

## 2023-02-03 NOTE — Telephone Encounter (Signed)
Contacted Laura Farrell to schedule their annual wellness visit. Patient declined to schedule AWV at this time.  Patient does not want to participate in AWV.  Ask not to be called for AWV appointments.  Gabriel Cirri [facility] AWV TEAM Direct Dial [phone]

## 2023-02-03 NOTE — Telephone Encounter (Signed)
Please place any orders needed / requested.

## 2023-02-08 ENCOUNTER — Other Ambulatory Visit: Payer: Self-pay

## 2023-02-08 DIAGNOSIS — L719 Rosacea, unspecified: Secondary | ICD-10-CM

## 2023-02-08 MED ORDER — METRONIDAZOLE 0.75 % EX GEL: 1.0000 | Freq: Two times a day (BID) | CUTANEOUS | 0 refills | Status: DC

## 2023-02-08 NOTE — Telephone Encounter (Signed)
I have sent this prescription to the pharmacy on file.   

## 2023-02-14 DIAGNOSIS — M546 Pain in thoracic spine: Secondary | ICD-10-CM | POA: Diagnosis not present

## 2023-02-14 DIAGNOSIS — Z6825 Body mass index (BMI) 25.0-25.9, adult: Secondary | ICD-10-CM | POA: Diagnosis not present

## 2023-02-15 DIAGNOSIS — N3946 Mixed incontinence: Secondary | ICD-10-CM | POA: Diagnosis not present

## 2023-02-15 DIAGNOSIS — N958 Other specified menopausal and perimenopausal disorders: Secondary | ICD-10-CM | POA: Diagnosis not present

## 2023-02-16 ENCOUNTER — Other Ambulatory Visit (HOSPITAL_BASED_OUTPATIENT_CLINIC_OR_DEPARTMENT_OTHER): Payer: Self-pay | Admitting: Neurosurgery

## 2023-02-16 DIAGNOSIS — M546 Pain in thoracic spine: Secondary | ICD-10-CM

## 2023-02-18 ENCOUNTER — Ambulatory Visit (HOSPITAL_BASED_OUTPATIENT_CLINIC_OR_DEPARTMENT_OTHER)
Admission: RE | Admit: 2023-02-18 | Discharge: 2023-02-18 | Disposition: A | Discharge status: Home / Self Care | Payer: HMO | Source: Ambulatory Visit | Attending: Neurosurgery | Admitting: Neurosurgery

## 2023-02-18 DIAGNOSIS — M546 Pain in thoracic spine: Secondary | ICD-10-CM

## 2023-02-18 DIAGNOSIS — M549 Dorsalgia, unspecified: Secondary | ICD-10-CM | POA: Diagnosis not present

## 2023-02-21 NOTE — Progress Notes (Unsigned)
Assessment/Plan:    1.  Tremor  -Her tremor has been difficult, as objective tremor findings have been incongruent with subjective complaints.  We have archimedes spirals back to 2014 and they are nl/unremarkable  -Continue primidone, 50 mg, 2 in the AM, 4 in the PM  -Continue propranolol, 10 mg 2 times per day.  She asked about increasing this but I don't want to do that due to LBP  -don't want to start topamax d/t remote hx of nephrolithiasis  -already on gabapentin 600 mg daily for hot flashes (was bid).  Could try going to bid again and see if helps tremor  -she asked me about carbidopa/levodopa and I told her that she has no evidence of Parkinsons Disease and this would not be appropriate  -told her options limited.  She and I discussed and decided to continue current med. Not dbs candidate d/t very mild sx's.    2.  F/u 1 year  Subjective:   Laura Farrell was seen today in follow up for tremor.  My previous records were reviewed prior to todays visit.  Med records are reviewed since last visit, including from her primary care visit in December 14, 2022.  She continues on primidone and propranolol.  She feels tremor is not well-controlled.  She states that it comes and goes - "the days I come and see you it is good."  She states that a friend told her to ask me about trying carbidopa/levodopa.  She did see urogynecology December 27, 2022 regarding urinary incontinence and treatments were discussed including pessary, surgical options.  They also discussed using vaginal estrogen cream.    Current prescribed movement disorder medications: Propranolol, 10 mg twice per day Primidone, 50 mg, 4 tablets in the morning, 2 tablets at night (she is now doing 2 in the AM/4 in the PM because it made her tired the other way) (On gabapentin 600 mg daily, although written bid,  for other issues - hot flashes)  ALLERGIES:   Allergies  Allergen Reactions   Other Other (See Comments)    Liquid  medication    CURRENT MEDICATIONS:  Outpatient Encounter Medications as of 02/24/2023  Medication Sig   aspirin 81 MG tablet Take 81 mg by mouth daily.   atorvastatin (LIPITOR) 80 MG tablet Take 1 tablet (80 mg total) by mouth daily.   buPROPion ER (WELLBUTRIN SR) 100 MG 12 hr tablet Take 1 tablet (100 mg total) by mouth in the morning.   carbamide peroxide (DEBROX) 6.5 % OTIC solution Place 5 drops into both ears 2 (two) times daily.   cephALEXin (KEFLEX) 250 MG capsule 1 capsule every 6 hours by oral route.   CREAM BASE EX Apply topically 2 (two) times daily.   cyclobenzaprine (FLEXERIL) 10 MG tablet TAKE ONE TABLET BY MOUTH THREE TIMES DAILY AS NEEDED FOR MUSCLE SPASMS   diclofenac (VOLTAREN) 75 MG EC tablet TAKE 1 TABLET BY MOUTH TWO TIMES A DAY AS NEEDED   diclofenac Sodium (VOLTAREN) 1 % GEL Apply 2 g topically 4 (four) times daily.   Diclofenac Sodium 3 % GEL Apply 1 Application topically every 4 (four) hours.   doxycycline (VIBRA-TABS) 100 MG tablet Take 1 tablet (100 mg total) by mouth 2 (two) times daily.   empagliflozin (JARDIANCE) 10 MG TABS tablet Take 1 tablet (10 mg total) by mouth daily before breakfast.   famotidine-calcium carbonate-magnesium hydroxide (PEPCID COMPLETE) 10-800-165 MG chewable tablet Chew 1 tablet by mouth daily as needed. Use  for as needed heartburn only   ferrous sulfate 325 (65 FE) MG tablet TAKE 1 TABLET BY MOUTH DAILY   gabapentin (NEURONTIN) 600 MG tablet Take 1 tablet (600 mg total) by mouth 2 (two) times daily.   levothyroxine (SYNTHROID) 100 MCG tablet Take 1 tablet (100 mcg total) by mouth daily.   loratadine (CLARITIN) 10 MG tablet Take 1 tablet (10 mg total) by mouth daily.   metFORMIN (GLUCOPHAGE) 500 MG tablet Take 1 tablet (500 mg total) by mouth 2 (two) times daily with a meal.   methocarbamol (ROBAXIN) 500 MG tablet Take 1 tablet (500 mg total) by mouth 3 (three) times daily.   metroNIDAZOLE (METROGEL) 0.75 % gel Apply 1 Application topically  2 (two) times daily.   neomycin-polymyxin b-dexamethasone (MAXITROL) 3.5-10000-0.1 OINT Place into the left eye 4 times daily.   omeprazole (PRILOSEC) 20 MG capsule Take 1 capsule (20 mg total) by mouth daily. Use daily to eliminate heartburn all day.   phentermine (ADIPEX-P) 37.5 MG tablet TAKE 1/2-1 TABLET BY MOUTH DAILY AS NEEDED   prednisoLONE acetate (PRED FORTE) 1 % ophthalmic suspension PLACE 1 DROP INTO LEFT EYE DAILY   primidone (MYSOLINE) 50 MG tablet TAKE FOUR TABLETS BY MOUTH EVERY MORNING TAKE TWO TABLETS BY MOUTH EVERY EVENING   propranolol (INDERAL) 10 MG tablet Take 1 tablet (10 mg total) by mouth 2 (two) times daily. 10 mg twice per day   rosuvastatin (CRESTOR) 40 MG tablet    solifenacin (VESICARE) 10 MG tablet Take 1 tablet (10 mg total) by mouth daily.   Sulfacetamide Sodium-Sulfur (PLEXION) 9.8-4.8 % CREA Apply to affected areas twice daily   tirzepatide [facility]) 2.5 MG/0.5ML Pen Inject 2.5 mg into the skin once a week.   topiramate (TOPAMAX) 25 MG tablet Take 1 tablet (25 mg total) by mouth 2 (two) times daily.   traMADol (ULTRAM) 50 MG tablet Take 1 tablet (50 mg total) by mouth every 6 (six) hours as needed.   triamcinolone cream (KENALOG) 0.1 %    valACYclovir (VALTREX) 1000 MG tablet Take 1 tablet (1,000 mg total) by mouth 3 (three) times daily. For 7 days for shingles.   venlafaxine XR (EFFEXOR-XR) 37.5 MG 24 hr capsule Take 1 capsule (37.5 mg total) by mouth daily.   [DISCONTINUED] amoxicillin-clavulanate (AUGMENTIN) 875-125 MG tablet Take 1 tablet by mouth 2 (two) times daily. (Patient not taking: Reported on 02/24/2023)   [DISCONTINUED] gabapentin (NEURONTIN) 600 MG tablet TAKE ONE TABLET BY MOUTH TWICE DAILY   [DISCONTINUED] venlafaxine XR (EFFEXOR-XR) 37.5 MG 24 hr capsule TAKE ONE CAPSULE BY MOUTH EVERY DAY   No facility-administered encounter medications on file as of 02/24/2023.     Objective:    PHYSICAL EXAMINATION:    VITALS:   Vitals:   02/24/23  0836  BP: 118/80  Pulse: 66  SpO2: 98%  Weight: 147 lb 6.4 oz (66.9 kg)  Height: 5\' 4"  (1.626 m)   GEN:  The patient appears stated age and is in NAD. HEENT:  Normocephalic, atraumatic.  The mucous membranes are moist.   Neurological examination:  Orientation: The patient is alert and oriented x3. Cranial nerves: There is good facial symmetry. The speech is fluent and clear.  Soft palate rises symmetrically and there is no tongue deviation. Hearing is intact to conversational tone. Sensation: Sensation is intact to light touch throughout Motor: Strength is at least antigravity x4.  Movement examination: Tone: There is normal tone in the UE/LE Abnormal movements: she has no rest tremor.  No postural or intention tremor.  she has no difficulty with archimedes spirals.  Coordination:  There is no decremation with RAM's, with any form of RAMS, including alternating supination and pronation of the forearm, hand opening and closing, finger taps, heel taps and toe taps.  Gait and Station: The patient has no difficulty arising out of a deep-seated chair without the use of the hands. The patient's stride length is good I have reviewed and interpreted the following labs independently   Chemistry      Component Value Date/Time   NA 141 12/14/2022 0856   NA 141 09/13/2016 0803   K 4.1 12/14/2022 0856   K 4.4 09/13/2016 0803   CL 105 12/14/2022 0856   CO2 25 12/14/2022 0856   CO2 23 09/13/2016 0803   BUN 16 12/14/2022 0856   BUN 15.8 09/13/2016 0803   CREATININE 1.00 12/14/2022 0856   CREATININE 0.9 09/13/2016 0803      Component Value Date/Time   CALCIUM 9.9 12/14/2022 0856   CALCIUM 9.8 09/13/2016 0803   ALKPHOS 81 12/14/2022 0856   ALKPHOS 73 09/13/2016 0803   AST 26 12/14/2022 0856   AST 20 09/13/2016 0803   ALT 26 12/14/2022 0856   ALT 19 09/13/2016 0803   BILITOT 0.2 12/14/2022 0856   BILITOT 0.25 09/13/2016 0803     Lab Results  Component Value Date   TSH 1.82  12/14/2022     Cc:  Lula Olszewski, MD

## 2023-02-23 ENCOUNTER — Telehealth: Payer: Self-pay | Admitting: Pharmacist

## 2023-02-23 ENCOUNTER — Other Ambulatory Visit: Payer: Self-pay

## 2023-02-23 DIAGNOSIS — E782 Mixed hyperlipidemia: Secondary | ICD-10-CM

## 2023-02-23 MED ORDER — VENLAFAXINE HCL ER 37.5 MG PO CP24: 37.5000 mg | ORAL_CAPSULE | Freq: Every day | ORAL | 3 refills | Status: DC

## 2023-02-23 MED ORDER — GABAPENTIN 600 MG PO TABS: 600.0000 mg | ORAL_TABLET | Freq: Two times a day (BID) | ORAL | 3 refills | Status: DC

## 2023-02-23 NOTE — Telephone Encounter (Signed)
Sent in Gabapentin and Venlafaxine (after speaking with patient).

## 2023-02-23 NOTE — Telephone Encounter (Signed)
This patient has been identified as "high risk" and in the top 25% of risk stratification for [facility] accountable patients.  These patients were identified using a number of factors including # of hospitalizations, ED visits, HF exacerbations, elevated BP and A1c, and overall cost of care.   Referral placed for cosign by the PCP.  [facility] team to schedule once cosigned.  Bellarose Burtt, PharmD Clinical Pharmacist  [facility] [phone]  

## 2023-02-24 ENCOUNTER — Ambulatory Visit (INDEPENDENT_AMBULATORY_CARE_PROVIDER_SITE_OTHER): Payer: HMO | Admitting: Neurology

## 2023-02-24 ENCOUNTER — Encounter: Payer: Self-pay | Admitting: Neurology

## 2023-02-24 DIAGNOSIS — G250 Essential tremor: Secondary | ICD-10-CM | POA: Diagnosis not present

## 2023-02-24 MED ORDER — PROPRANOLOL HCL 10 MG PO TABS: 10.0000 mg | ORAL_TABLET | Freq: Two times a day (BID) | ORAL | 3 refills | Status: DC

## 2023-02-24 MED ORDER — PRIMIDONE 50 MG PO TABS: ORAL_TABLET | ORAL | 3 refills | Status: DC

## 2023-02-24 NOTE — Patient Instructions (Signed)
Essential Tremor ?A tremor is trembling or shaking that a person cannot control. Most tremors affect the hands or arms. Tremors can also affect the head, vocal cords, legs, and other parts of the body. Essential tremor is a tremor without a known cause. Usually, it occurs while a person is trying to perform an action. It tends to get worse gradually as a person ages. ?What are the causes? ?The cause of this condition is not known, but it often runs in families. ?What increases the risk? ?You are more likely to develop this condition if: ?You have a family member with essential tremor. ?You are 40 years of age or older. ?What are the signs or symptoms? ?The main sign of a tremor is a rhythmic shaking of certain parts of your body that is uncontrolled and unintentional. You may: ?Have difficulty eating with a spoon or fork. ?Have difficulty writing. ?Nod your head up and down or side to side. ?Have a quivering voice. ?The shaking may: ?Get worse over time. ?Come and go. ?Be more noticeable on one side of your body. ?Get worse due to stress, tiredness (fatigue), caffeine, and extreme heat or cold. ?How is this diagnosed? ?This condition may be diagnosed based on: ?Your symptoms and medical history. ?A physical exam. ?There is no single test to diagnose an essential tremor. However, your health care provider may order tests to rule out other causes of your condition. These may include: ?Blood and urine tests. ?Imaging studies of your brain, such as a CT scan or MRI. ?How is this treated? ?Treatment for essential tremor depends on the severity of the condition. ?Mild tremors may not need treatment if they do not affect your day-to-day life. ?Severe tremors may need to be treated using one or more of the following options: ?Medicines. ?Injections of a substance called botulinum toxin. ?Procedures such as deep brain stimulation (DBS) implantation or MRI-guided ultrasound treatment. ?Lifestyle changes. ?Occupational or  physical therapy. ?Follow these instructions at home: ?Lifestyle ? ?Do not use any products that contain nicotine or tobacco. These products include cigarettes, chewing tobacco, and vaping devices, such as e-cigarettes. If you need help quitting, ask your health care provider. ?Limit your caffeine intake as told by your health care provider. ?Try to get 8 hours of sleep each night. ?Find ways to manage your stress that fit your lifestyle and personality. Consider trying meditation or yoga. ?Try to anticipate stressful situations and allow extra time to manage them. ?If you are struggling emotionally with the effects of your tremor, consider working with a mental health provider. ?General instructions ?Take over-the-counter and prescription medicines only as told by your health care provider. ?Avoid extreme heat and extreme cold. ?Keep all follow-up visits. This is important. Visits may include physical therapy visits. ?Where to find more information ?National Institute of Neurological Disorders and Stroke: www.ninds.nih.gov ?Contact a health care provider if: ?You experience any changes in the location or intensity of your tremors. ?You start having a tremor after starting a new medicine. ?You have a tremor with other symptoms, such as: ?Numbness. ?Tingling. ?Pain. ?Weakness. ?Your tremor gets worse. ?Your tremor interferes with your daily life. ?You feel down, blue, or sad for at least 2 weeks in a row. ?Worrying about your tremor and what other people think about you interferes with your everyday life functions, including relationships, work, or school. ?Summary ?Essential tremor is a tremor without a known cause. Usually, it occurs when you are trying to perform an action. ?You are more likely   to develop this condition if you have a family member with essential tremor. ?The main sign of a tremor is a rhythmic shaking of certain parts of your body that is uncontrolled and unintentional. ?Treatment for essential  tremor depends on the severity of the condition. ?This information is not intended to replace advice given to you by your health care provider. Make sure you discuss any questions you have with your health care provider. ?Document Revised: 07/17/2021 Document Reviewed: 07/17/2021 ?Elsevier Patient Education ? 2023 Elsevier Inc. ? ?

## 2023-02-25 ENCOUNTER — Telehealth: Payer: Self-pay | Admitting: Pharmacist

## 2023-02-25 NOTE — Progress Notes (Signed)
Care Management & Coordination Services Pharmacy Team  Reason for Encounter: Appointment Reminder  Contacted patient to confirm in office appointment with Erskine Emery, PharmD on 02/28/2023 at 9 am. Unsuccessful outreach. Left voicemail for patient to return call.  Do you have any problems getting your medications?  If yes what types of problems are you experiencing?   What is your top health concern you would like to discuss at your upcoming visit?   Have you seen any other providers since your last visit with PCP?    Chart review:  Recent office visits:  12/14/2022 OV (PCP) Lula Olszewski, MD; Amoxicillin for otitis media of left ear.  11/16/2022 OV (PCP) Lula Olszewski, MD; no medication changes indicated.  09/28/2022 OV (PCP) Lula Olszewski, MD; After discussion of all of the above we felt that the best long-term plan would be a combination cocktail of metformin topiramate and Wellbutrin each of the low doses so that we do not get too much side effects from any of them.   09/20/2022 OV (PCP) Lula Olszewski, MD; Advised OTC allergy medications and simply saline and call me if it get severe and more thick or if she gets high fever.   09/07/2022 OV (PCP) Lula Olszewski, MD; no medication changes indicated.  Recent consult visits:  02/24/2023 OV (Neurology) Tat, Octaviano Batty, DO; no medication changes indicated.  02/15/2023 OV (Urology) Vernon Prey, Sharl Ma, MD; Will start trospium 20mg  BID, Rx sent to pharmacy to minimize impact on cognitive dysfunction   02/14/2023 OV [facility]); no medication changes indicated.   01/31/2023 OV (Ophthalmology) Holli Humbles, MD; no medication changes indicated.  01/19/2023 OV (Ophthalmology) Holli Humbles, MD; Increase PF BID to q2hrs   01/12/2023 OV [facility]); no medication changes indicated.  12/27/2022 Initial Consult (Urology) Corena Pilgrim,  MD; no medication changes indicated.  Hospital visits:  None in previous 6 months   Star Rating Drugs:  Atorvastatin 80 mg last filled 11/10/2022 90 DS Jardiance 10 mg last filled 02/02/2023 90 DS   Care Gaps: Annual wellness visit in last year? Yes   Future Appointments  Date Time Provider Department Center  02/28/2023  9:00 AM Erroll Luna [facility] None   April D Calhoun, [place] Clinical Pharmacist Assistant [phone]

## 2023-02-28 ENCOUNTER — Ambulatory Visit: Payer: HMO | Admitting: Pharmacist

## 2023-02-28 NOTE — Progress Notes (Signed)
Care Management & Coordination Services Pharmacy Note  03/03/2023 Name:  Laura Farrell MRN:  [id number] DOB:  [birth year]  Summary: PharmD initial visit.  Patient medications reviewed at length.  Controlling all chronicc conditions.  LDL slightly above goal for DM.  Iron levels WNL last time but she stopped taking her iron for unknown reason.    Recommendations/Changes made from today's visit: Resume iron, recheck LDL at next visit - could potentially add Zetia  Follow up plan: FU 6 months CMA to FU to make sure DEXA scheduled after June 2024   Subjective: Laura Farrell is an 77 y.o. year old female who is a primary patient of Lula Olszewski, MD.  The care coordination team was consulted for assistance with disease management and care coordination needs.    Engaged with patient face to face for initial visit.  Recent office visits:  12/14/2022 OV (PCP) Lula Olszewski, MD; Amoxicillin for otitis media of left ear.   11/16/2022 OV (PCP) Lula Olszewski, MD; no medication changes indicated.   09/28/2022 OV (PCP) Lula Olszewski, MD; After discussion of all of the above we felt that the best long-term plan would be a combination cocktail of metformin topiramate and Wellbutrin each of the low doses so that we do not get too much side effects from any of them.    09/20/2022 OV (PCP) Lula Olszewski, MD; Advised OTC allergy medications and simply saline and call me if it get severe and more thick or if she gets high fever.    09/07/2022 OV (PCP) Lula Olszewski, MD; no medication changes indicated.   Recent consult visits:  02/24/2023 OV (Neurology) Tat, Octaviano Batty, DO; no medication changes indicated.   02/15/2023 OV (Urology) Vernon Prey, Sharl Ma, MD; Will start trospium 20mg  BID, Rx sent to pharmacy to minimize impact on cognitive dysfunction    02/14/2023 OV [facility]); no medication changes indicated.    01/31/2023 OV (Ophthalmology)  Holli Humbles, MD; no medication changes indicated.   01/19/2023 OV (Ophthalmology) Holli Humbles, MD; Increase PF BID to q2hrs    01/12/2023 OV [facility]); no medication changes indicated.   12/27/2022 Initial Consult (Urology) Corena Pilgrim, MD; no medication changes indicated.   Hospital visits:  None in previous 6 months   Objective:  Lab Results  Component Value Date   CREATININE 1.00 12/14/2022   BUN 16 12/14/2022   GFR 53.77 (L) 12/14/2022   EGFR 68 (L) 09/13/2016   NA 141 12/14/2022   K 4.1 12/14/2022   CALCIUM 9.9 12/14/2022   CO2 25 12/14/2022   GLUCOSE 80 12/14/2022    Lab Results  Component Value Date/Time   HGBA1C 6.2 12/14/2022 08:56 AM   HGBA1C 6.4 06/11/2022 11:19 AM   GFR 53.77 (L) 12/14/2022 08:56 AM   GFR 62.83 08/17/2022 08:55 AM   MICROALBUR <0.7 08/17/2022 08:55 AM   MICROALBUR 1.0 06/10/2020 09:26 AM    Last diabetic Eye exam:  Lab Results  Component Value Date/Time   HMDIABEYEEXA no retinopathy per pt 05/17/2013 12:00 AM    Last diabetic Foot exam:  Lab Results  Component Value Date/Time   HMDIABFOOTEX normal 08/16/2013 12:00 AM     Lab Results  Component Value Date   CHOL 154 12/14/2022   HDL 46.20 12/14/2022   LDLCALC 86 12/14/2022   LDLDIRECT 132.4 07/17/2013   TRIG 105.0 12/14/2022   CHOLHDL 3 12/14/2022       Latest Ref  Rng & Units 12/14/2022    8:56 AM 08/17/2022    8:55 AM 06/11/2022   11:19 AM  Hepatic Function  Total Protein 6.0 - 8.3 g/dL 7.2  7.1  6.7   Albumin 3.5 - 5.2 g/dL 4.4  4.4  3.8   AST 0 - 37 U/L 26  16  23    ALT 0 - 35 U/L 26  13  20    Alk Phosphatase 39 - 117 U/L 81  65  46   Total Bilirubin 0.2 - 1.2 mg/dL 0.2  0.2  0.3     Lab Results  Component Value Date/Time   TSH 1.82 12/14/2022 08:56 AM   TSH 2.00 08/17/2022 08:55 AM   FREET4 0.59 (L) 08/17/2022 08:55 AM   FREET4 0.80 06/11/2022 11:19 AM       Latest Ref Rng & Units 12/14/2022    8:56  AM 08/17/2022    8:55 AM 06/18/2022   11:05 AM  CBC  WBC 4.0 - 10.5 K/uL 5.6  5.1  4.7   Hemoglobin 12.0 - 15.0 g/dL 40.9  81.1  91.4   Hematocrit 36.0 - 46.0 % 41.4  31.8  32.5   Platelets 150.0 - 400.0 K/uL 183.0  216.0  180.0     Lab Results  Component Value Date/Time   VD25OH 15.55 (L) 08/17/2022 08:55 AM   VITAMINB12 >1500 (H) 08/23/2012 10:19 AM    Clinical ASCVD: No  The 10-year ASCVD risk score (Arnett DK, et al., 2019) is: 45.3%   Values used to calculate the score:     Age: 44 years     Sex: Female     Is Non-Hispanic African American: No     Diabetic: Yes     Tobacco smoker: No     Systolic Blood Pressure: 118 mmHg     Is BP treated: Yes     HDL Cholesterol: 46.2 mg/dL     Total Cholesterol: 154 mg/dL        04/18/2955    2:13 AM 09/07/2022   10:53 AM 05/13/2022   11:49 AM  Depression screen PHQ 2/9  Decreased Interest 0 0 0  Down, Depressed, Hopeless 0 0 0  PHQ - 2 Score 0 0 0  Altered sleeping 0    Tired, decreased energy 0    Change in appetite 3    Feeling bad or failure about yourself  0    Trouble concentrating 0    Moving slowly or fidgety/restless 0    Suicidal thoughts 0    PHQ-9 Score 3    Difficult doing work/chores Not difficult at all       Social History   Tobacco Use  Smoking Status Never  Smokeless Tobacco Never   BP Readings from Last 3 Encounters:  02/24/23 118/80  12/14/22 102/68  11/16/22 111/66   Pulse Readings from Last 3 Encounters:  02/24/23 66  12/14/22 60  11/16/22 60   Wt Readings from Last 3 Encounters:  02/24/23 147 lb 6.4 oz (66.9 kg)  12/14/22 161 lb 12.8 oz (73.4 kg)  11/16/22 166 lb 12.8 oz (75.7 kg)   BMI Readings from Last 3 Encounters:  02/24/23 25.30 kg/m  12/14/22 27.77 kg/m  11/16/22 28.63 kg/m    Allergies  Allergen Reactions   Other Other (See Comments)    Liquid medication    Medications Reviewed Today     Reviewed by Erroll Luna, [facility] (Pharmacist) on 03/03/23 at 0829  Med List  Status: <None>  Medication Order Taking? Sig Documenting Provider Last Dose Status Informant  aspirin 81 MG tablet [id number]  Take 81 mg by mouth daily. [provider]  Active   atorvastatin (LIPITOR) 80 MG tablet [id number]  Take 1 tablet (80 mg total) by mouth daily. Willow Ora, MD  Active   buPROPion ER [facility] SR) 100 MG 12 hr tablet [id number]  Take 1 tablet (100 mg total) by mouth in the morning. Lula Olszewski, MD  Active   CREAM BASE [place] [id number]  Apply topically 2 (two) times daily. [provider]  Active   cyclobenzaprine (FLEXERIL) 10 MG tablet [id number]  TAKE ONE TABLET BY MOUTH THREE TIMES DAILY AS NEEDED FOR MUSCLE SPASMS Lula Olszewski, MD  Active   diclofenac (VOLTAREN) 75 MG EC tablet [id number]  TAKE 1 TABLET BY MOUTH TWO TIMES A DAY AS NEEDED Willow Ora, MD  Active   diclofenac Sodium (VOLTAREN) 1 % GEL [id number]  Apply 2 g topically 4 (four) times daily. Lula Olszewski, MD  Active   Diclofenac Sodium 3 % GEL [id number]  Apply 1 Application topically every 4 (four) hours. Lula Olszewski, MD  Active   empagliflozin (JARDIANCE) 10 MG TABS tablet [id number]  Take 1 tablet (10 mg total) by mouth daily before breakfast. Lula Olszewski, MD  Active   famotidine-calcium carbonate-magnesium hydroxide [facility] COMPLETE) [phone] MG chewable tablet [id number]  Chew 1 tablet by mouth daily as needed. Use for as needed heartburn only Lula Olszewski, MD  Consider Medication Status and Discontinue (Patient Preference)   ferrous sulfate 325 (65 FE) MG tablet [id number]  TAKE 1 TABLET BY MOUTH DAILY Lula Olszewski, MD  Active   gabapentin (NEURONTIN) 600 MG tablet [id number]  Take 1 tablet (600 mg total) by mouth 2 (two) times daily. Lula Olszewski, MD  Active   levothyroxine (SYNTHROID) 100 MCG tablet [id number]  Take 1 tablet (100 mcg total) by mouth daily. Lula Olszewski, MD  Active   metFORMIN (GLUCOPHAGE) 500 MG tablet [id number]  Take 1 tablet (500  mg total) by mouth 2 (two) times daily with a meal. Lula Olszewski, MD  Active   metroNIDAZOLE (METROGEL) 0.75 % gel [id number]  Apply 1 Application topically 2 (two) times daily. Lula Olszewski, MD  Active   omeprazole (PRILOSEC) 20 MG capsule [id number]  Take 1 capsule (20 mg total) by mouth daily. Use daily to eliminate heartburn all day. Lula Olszewski, MD  Active   prednisoLONE acetate (PRED FORTE) 1 % ophthalmic suspension [id number]  PLACE 1 DROP INTO LEFT EYE DAILY [provider]  Active   primidone (MYSOLINE) 50 MG tablet [id number]  2 in the AM, 4 in the PM Tat, Rebecca S, DO  Active   propranolol (INDERAL) 10 MG tablet [id number]  Take 1 tablet (10 mg total) by mouth 2 (two) times daily. Vladimir Faster, DO  Active   solifenacin (VESICARE) 10 MG tablet [id number]  Take 1 tablet (10 mg total) by mouth daily. Lula Olszewski, MD  Active   tirzepatide [facility]) 2.5 MG/0.5ML Pen [id number]  Inject 2.5 mg into the skin once a week. Lula Olszewski, MD  Active   traMADol Janean Sark) 50 MG tablet [id number]  Take 1 tablet (50 mg total) by mouth every 6 (six) hours as needed. Lula Olszewski, MD  Active   triamcinolone cream (KENALOG) 0.1 % [id number]   [provider]  Active   venlafaxine XR (EFFEXOR-XR) 37.5 MG  24 hr capsule [id number]  Take 1 capsule (37.5 mg total) by mouth daily. Lula Olszewski, MD  Active             SDOH:  (Social Determinants of Health) assessments and interventions performed: Yes  Financial Resource Strain: Low Risk  (03/03/2023)   Overall Financial Resource Strain (CARDIA)    Difficulty of Paying Living Expenses: Not hard at all   Food Insecurity: No Food Insecurity (03/03/2023)   Hunger Vital Sign    Worried About Running Out of Food in the Last Year: Never true    Ran Out of Food in the Last Year: Never true    Medication Assistance: None required.  Patient affirms current coverage meets needs.  Medication Access: Name and location of  current pharmacy:  [facility] - [place], [place] - [address] [place] [address] Phone: [phone] Fax: [phone]  [facility] [id number] [place]in[place] Otto, [place] - [address] [place] [address] Phone: [phone] Fax: [phone]  Within the past 30 days, how often has patient missed a dose of medication? 0 Is a pillbox or other method used to improve adherence? Yes  Factors that may affect medication adherence? financial need Are meds synced by current pharmacy? No  Are meds delivered by current pharmacy? No  Does patient experience delays in picking up medications due to transportation concerns? No   Compliance/Adherence/Medication fill history: Star Rating Drugs:  Atorvastatin 80 mg last filled 11/10/2022 90 DS Jardiance 10 mg last filled 02/02/2023 90 DS     Care Gaps: Annual wellness visit in last year? Yes   Assessment/Plan   Hyperlipidemia: (LDL goal < 70) -Not ideally controlled, LDL 86 most recent labs -Current treatment: Atorvastatin 80mg  Appropriate, Query effective, Safe, Accessible -Medications previously tried: Rosuvastatin (intolerant)  -Educated on Cholesterol goals;  Benefits of statin for ASCVD risk reduction; Importance of limiting foods high in cholesterol; Patient with DM, goal LDL < 70 -Counseled on diet and exercise extensively Recommended to continue current medication Could consider addition of Zetia to further reduce towards goal.  For now we will work on dietary mods and exercise.  Weight Management (Maintain healthy weight) -Controlled -Current medications: Metformin 500mg  bid Appropriate, Effective, Safe, Accessible Mounjaro 2.5mg  once weekly Appropriate, Effective, Safe, Accessible -Medications previously tried: none noted  -Educated on Exercise goal of 150 minutes per week; Benefits of weight loss; -Counseled to check feet daily and get yearly eye  exams -Counseled on diet and exercise extensively Recommended to continue current medication Assessed patient finances. Copays currently acceptable.  Continue current doses.  Unclear if she needs to really continue metformin but will leave as is for now.   Osteopenia (Goal Maintain bone density, prevent fracture) -Controlled -Last DEXA Scan: June 2022   T-Score femoral neck: -0.9   T-Score lumbar spine: -1.0    -Patient is not a candidate for pharmacologic treatment -Current treatment  None -Medications previously tried: none noted  -Recommend [phone] units of vitamin D daily. Recommend 1200 mg of calcium daily from dietary and supplemental sources. -Counseled on diet and exercise extensively Will need new bone density screening after June 2024.  Recommend schedule appt with PCP and will get ordered at physical. Patient agreeable to plan.  Will fu with CMA to make sure this is scheduled.  Anemia in CKD (Goal: Normalize iron levels) -Controlled -Current treatment  Ferrous Sulfate 325mg  Appropriate, Query effective, ,  -Medications previously tried: none noted -Not currently  taking iron.  Last Hgb was WNL while she was taking iron.  She has stopped taking iron for unknown reason.  Agreed today to resume her iron once daily as prescribed  -Recommended to continue current medication Recheck Iron levels at next CPE visit.  Willa Frater, PharmD Clinical Pharmacist  [facility] [phone]

## 2023-03-14 ENCOUNTER — Ambulatory Visit (INDEPENDENT_AMBULATORY_CARE_PROVIDER_SITE_OTHER): Payer: HMO | Admitting: Internal Medicine

## 2023-03-14 ENCOUNTER — Encounter: Payer: Self-pay | Admitting: Internal Medicine

## 2023-03-14 VITALS — BP 125/79 | HR 70 | Temp 97.6°F | Ht 64.0 in | Wt 150.4 lb

## 2023-03-14 DIAGNOSIS — D649 Anemia, unspecified: Secondary | ICD-10-CM | POA: Diagnosis not present

## 2023-03-14 DIAGNOSIS — M549 Dorsalgia, unspecified: Secondary | ICD-10-CM | POA: Diagnosis not present

## 2023-03-14 DIAGNOSIS — Z7985 Long-term (current) use of injectable non-insulin antidiabetic drugs: Secondary | ICD-10-CM | POA: Diagnosis not present

## 2023-03-14 DIAGNOSIS — E039 Hypothyroidism, unspecified: Secondary | ICD-10-CM

## 2023-03-14 DIAGNOSIS — G8929 Other chronic pain: Secondary | ICD-10-CM

## 2023-03-14 DIAGNOSIS — N183 Chronic kidney disease, stage 3 unspecified: Secondary | ICD-10-CM | POA: Diagnosis not present

## 2023-03-14 DIAGNOSIS — E1122 Type 2 diabetes mellitus with diabetic chronic kidney disease: Secondary | ICD-10-CM | POA: Diagnosis not present

## 2023-03-14 DIAGNOSIS — R8271 Bacteriuria: Secondary | ICD-10-CM | POA: Diagnosis not present

## 2023-03-14 MED ORDER — VENLAFAXINE HCL ER 75 MG PO CP24
75.0000 mg | ORAL_CAPSULE | Freq: Every day | ORAL | 3 refills | Status: DC
Start: 2023-03-14 — End: 2023-08-23

## 2023-03-14 MED ORDER — DICLOFENAC SODIUM 1 % EX GEL: 2.0000 g | Freq: Four times a day (QID) | CUTANEOUS | 11 refills | Status: DC

## 2023-03-14 MED ORDER — TIRZEPATIDE 5 MG/0.5ML ~~LOC~~ SOAJ
5.0000 mg | SUBCUTANEOUS | 11 refills | Status: DC
Start: 2023-03-14 — End: 2023-03-15

## 2023-03-14 NOTE — Progress Notes (Signed)
[facility]: [phone]   Routine Medical Office Visit  Patient:  Laura Farrell      Age: 77 y.o.       Sex:  female  Date:   03/14/2023 PCP:    Lula Olszewski, MD   Today's Healthcare Provider: Lula Olszewski, MD   Assessment and Plan:   There was a large number of lab follow up we needed to draw today for monitoring.  Based on [employer] AI conversational text extraction: Assessment and Plan    Thoracic Spine Pain: Pain managed with deep tissue massage, acupuncture, and exercises. Previous epidural steroid injection provided temporary relief. Patient reports improvement with acupuncture. -Continue current management strategies. -Provide referral for acupuncture to Danton Clap at [facility].  Medication Management: Patient currently on Effexor, gabapentin, and Voltaren. Concerns about potential kidney damage from Voltaren. -Increase Effexor to 75mg  for potential additional pain relief. -Discontinue Voltaren due to potential kidney damage. -Continue gabapentin as prescribed.  Normocytic Anemia: Chronic, likely secondary to kidney disease. No active bleeding reported. -Order comprehensive blood workup including reticulocyte count, ferritin, folate, B12, and thyroid function tests. -Check for potential causes of anemia, including leukemia.  Follow-up in 1-3 months for repeat labs and assessment of back pain management.      Based on updated problems and orders placed with problem based charting:  Assessment and Plan [name] was seen today for requesting acupuncture referral, medication refill and back pain.  Chronic back pain greater than 3 months duration Overview: Spinal injection(s) last only 2 week(s) Deep tissue massage last only 2 days Acupuncture helps request referral - placed 03/14/23 Topicals help Moves around Muscle relaxer(s) help    T11-T12: Minimal broad-based disc bulge eccentric towards the left.  No foraminal or central canal  stenosis. Mild bilateral facet  arthropathy.   IMPRESSION:  1. No acute osseous injury of the thoracic spine.  2. No significant thoracic spine disc protrusion, foraminal stenosis  or central canal stenosis.   Orders: -     Diclofenac Sodium; Apply 2 g topically 4 (four) times daily.  Dispense: 400 g; Refill: 11 -     Ambulatory referral for Acupuncture -     Venlafaxine HCl ER; Take 1 capsule (75 mg total) by mouth daily with breakfast.  Dispense: 90 capsule; Refill: 3  Long-term current use of injectable noninsulin antidiabetic medication -     Microalbumin / creatinine urine ratio -     Urinalysis, Routine w reflex microscopic  Type 2 diabetes mellitus with stage 3 chronic kidney disease, without long-term current use of insulin, unspecified whether stage 3a or 3b CKD (HCC) -     Ambulatory referral for Acupuncture -     CBC with Differential/Platelet -     Comprehensive metabolic panel -     Lipid panel -     Microalbumin / creatinine urine ratio -     Urinalysis, Routine w reflex microscopic  Stage 3 chronic kidney disease, unspecified whether stage 3a or 3b CKD (HCC) Overview: 07/16/22 We reviewed the possible causes of kidney injury like kidney stone but she has not had 1 in a very long time nonsteroidal anti-inflammatory drugs but she never takes them severe illness and viruses and dehydration but she cannot recall anything like that happening in the last 6 to 12 months new medications but she cannot think of taking any new ones urinary tract infections but she does not report having any diabetes but she does not have significant diabetes  although her sugar does run a little bit high prediabetic range blood pressure crises her blood pressures are always good the she asked about iron but I reassured her that does not generally affect kidney function.       Orders: -     VITAMIN D 25 Hydroxy (Vit-D Deficiency, Fractures) -     Uric acid -     Microalbumin / creatinine urine  ratio -     Urinalysis, Routine w reflex microscopic  Acquired hypothyroidism Overview: Lab Results  Component Value Date/Time   TSH 0.25 (L) 06/11/2022 11:19 AM   TSH 4.01 11/04/2021 12:20 PM   TSH 2.06 12/15/2020 08:34 AM   TSH 2.36 12/27/2019 09:10 AM   TSH 2.76 12/21/2018 10:16 AM    Orders: -     TSH + free T4 -     Parathyroid hormone, intact (no Ca); Future  Normocytic anemia -     Iron, TIBC and Ferritin Panel -     B12 and Folate Panel -     Parathyroid hormone, intact (no Ca); Future -     Pathologist smear review; Future -     Reticulocytes; Future  Other orders -     Parathyroid hormone, intact (no Ca) -     Pathologist smear review    Treatment plan discussed and reviewed in detail. Explained medication safety and potential side effects.  Answered all patient questions and confirmed understanding and comfort with the plan. Encouraged patient to contact our office if they have any questions or concerns. Agreed on patient returning to office if symptoms worsen, persist, or new symptoms develop. Discussed precautions in case of needing to visit the Emergency Department.         Clinical Presentation:    77 y.o. female here today for Requesting acupuncture referral, Medication Refill (Voltaren gel.), and Back Pain  Based on [employer] AI conversational text extraction:  Discussed the use of AI scribe software for clinical note transcription with the patient, who gave verbal consent to proceed.  History of Present Illness   The patient, with a history of thoracic spine arthritis, presents with persistent back pain. They report having received a single injection for pain relief at a neurologist pain clinic, which provided temporary relief for about two weeks. The patient underwent an MRI, which revealed arthritis in the thoracic spine. Following the MRI, they were advised to explore alternative methods for pain management.  The patient has since pursued deep tissue  massages, acupuncture, dry needling, and exercises. They report that the deep tissue massages provide relief for about two days, but are costly. They have had one acupuncture session, which they report has significantly improved their condition. They are scheduled for another acupuncture session and have requested a referral for this service.  The patient also reports using muscle relaxers, which were necessary particularly after the deep tissue massages. They have been taking one in the morning, one at night, and two at lunch. They also report using a topical magnesium product from [facility] for pain relief, which they apply to the areas beside their spine.  The patient has a history of kidney disease and is currently on a regimen of diclofenac, which they have been advised to discontinue due to potential kidney damage. They also report taking vitamin D and B12 supplements, and a daily 81 mg aspirin.  The patient also reports a history of bladder leakage, for which they have been prescribed Xelafen. They report that an increase in dosage  from 5 mg to 10 mg has been effective in managing this condition. They also report anemia, which is suspected to be due to their chronic kidney disease.        Reviewed chart data: Active Ambulatory Problems    Diagnosis Date Noted   Essential tremor 03/20/2012   HLP (hyperkeratosis lenticularis perstans)    OSA (obstructive sleep apnea) 05/24/2012   GERD (gastroesophageal reflux disease) 06/28/2012   Hyperlipidemia 06/28/2012   Hypothyroidism 06/28/2012   History of cluster headache 06/28/2012   Bilateral hydronephrosis 09/10/2013   At high risk for breast cancer 09/13/2016   Chronic tension headaches 08/14/2018   Menopausal hot flushes 08/14/2018   Family history of breast cancer in sister 08/14/2018   Osteoarthritis of left knee 04/10/2019   Status post corneal transplant 11/06/2009   Tubular adenoma of colon 12/27/2019   Rosacea 06/10/2020   Mixed  stress and urge urinary incontinence 12/15/2020   Screening for osteoporosis 04/07/2021   Lumbar radiculopathy 02/16/2017   Hearing loss 06/11/2022   History of back surgery 06/11/2022   Hyperkalemia 06/21/2022   CKD (chronic kidney disease) stage 3, GFR 30-59 ml/min (HCC) 07/16/2022   Anemia in chronic kidney disease (CKD) 08/17/2022   Acquired renal cyst of right kidney 09/07/2022   Family history of pancreatic cancer 09/07/2022   Hiatal hernia 09/10/2022   History of penetrating keratoplasty 11/06/2009   Overweight 09/28/2022   Chronic back pain greater than 3 months duration 03/14/2023   Type 2 diabetes mellitus with stage 3 chronic kidney disease, without long-term current use of insulin (HCC) 03/14/2023   Long-term current use of injectable noninsulin antidiabetic medication 03/14/2023   Resolved Ambulatory Problems    Diagnosis Date Noted   Bronchitis, acute 09/12/2014   Diet-controlled type 2 diabetes mellitus (HCC) 02/12/2015   Hot flashes 03/12/2015   Prediabetes 12/27/2019   Pain in left knee 11/22/2018   Acquired spondylolisthesis 02/16/2017   Acute thoracic back pain 06/11/2022   Displacement of lumbar intervertebral disc without myelopathy 04/03/2013   Lumbar spondylosis 06/11/2022   Tremor 02/16/2017   Cellulitis 06/11/2022   Acute kidney injury (HCC) 06/21/2022   Cerumen debris on tympanic membrane 07/16/2022   History of colon polyps 08/17/2022   Past Medical History:  Diagnosis Date   Diabetes mellitus    Headache(784.0)    Nephrolithiasis    Over weight    Thyroid disease    Urine incontinence     Outpatient Medications Prior to Visit  Medication Sig   aspirin 81 MG tablet Take 81 mg by mouth daily.   atorvastatin (LIPITOR) 80 MG tablet Take 1 tablet (80 mg total) by mouth daily.   buPROPion ER (WELLBUTRIN SR) 100 MG 12 hr tablet Take 1 tablet (100 mg total) by mouth in the morning.   CREAM BASE EX Apply topically 2 (two) times daily.    cyclobenzaprine (FLEXERIL) 10 MG tablet TAKE ONE TABLET BY MOUTH THREE TIMES DAILY AS NEEDED FOR MUSCLE SPASMS   Diclofenac Sodium 3 % GEL Apply 1 Application topically every 4 (four) hours.   empagliflozin (JARDIANCE) 10 MG TABS tablet Take 1 tablet (10 mg total) by mouth daily before breakfast.   estradiol (ESTRACE) 0.1 MG/GM vaginal cream Place a fingertip amount of cream in the vagina nightly x 2 weeks, then use every other night   famotidine (PEPCID) 20 MG tablet Take 20 mg by mouth 2 (two) times daily.   famotidine-calcium carbonate-magnesium hydroxide (PEPCID COMPLETE) 10-800-165 MG chewable tablet Chew 1 tablet  by mouth daily as needed. Use for as needed heartburn only   ferrous sulfate 325 (65 FE) MG tablet TAKE 1 TABLET BY MOUTH DAILY   gabapentin (NEURONTIN) 600 MG tablet Take 1 tablet (600 mg total) by mouth 2 (two) times daily.   levothyroxine (SYNTHROID) 100 MCG tablet Take 1 tablet (100 mcg total) by mouth daily.   metFORMIN (GLUCOPHAGE) 500 MG tablet Take 1 tablet (500 mg total) by mouth 2 (two) times daily with a meal.   metroNIDAZOLE (METROGEL) 0.75 % gel Apply 1 Application topically 2 (two) times daily.   omeprazole (PRILOSEC) 20 MG capsule Take 1 capsule (20 mg total) by mouth daily. Use daily to eliminate heartburn all day.   prednisoLONE acetate (PRED FORTE) 1 % ophthalmic suspension PLACE 1 DROP INTO LEFT EYE DAILY   primidone (MYSOLINE) 50 MG tablet 2 in the AM, 4 in the PM   propranolol (INDERAL) 10 MG tablet Take 1 tablet (10 mg total) by mouth 2 (two) times daily.   solifenacin (VESICARE) 10 MG tablet Take 1 tablet (10 mg total) by mouth daily.   traMADol (ULTRAM) 50 MG tablet Take 1 tablet (50 mg total) by mouth every 6 (six) hours as needed.   triamcinolone cream (KENALOG) 0.1 %    trospium (SANCTURA) 20 MG tablet Take 20 mg by mouth 2 (two) times daily.   [DISCONTINUED] diclofenac (VOLTAREN) 75 MG EC tablet TAKE 1 TABLET BY MOUTH TWO TIMES A DAY AS NEEDED    [DISCONTINUED] diclofenac Sodium (VOLTAREN) 1 % GEL Apply 2 g topically 4 (four) times daily.   [DISCONTINUED] tirzepatide [facility]) 2.5 MG/0.5ML Pen Inject 2.5 mg into the skin once a week.   [DISCONTINUED] VALIUM 5 MG tablet 1 PO 60  minutes prior to procedure with 2nd dose 15-20 min prior if needed   [DISCONTINUED] venlafaxine XR (EFFEXOR-XR) 37.5 MG 24 hr capsule Take 1 capsule (37.5 mg total) by mouth daily.   No facility-administered medications prior to visit.         Clinical Data Analysis:   Physical Exam  BP 125/79 (BP Location: Right Arm, Patient Position: Sitting)   Pulse 70   Temp 97.6 F (36.4 C) (Temporal)   Ht 5\' 4"  (1.626 m)   Wt 150 lb 6.4 oz (68.2 kg)   SpO2 99%   BMI 25.82 kg/m  Wt Readings from Last 10 Encounters:  03/14/23 150 lb 6.4 oz (68.2 kg)  02/24/23 147 lb 6.4 oz (66.9 kg)  12/14/22 161 lb 12.8 oz (73.4 kg)  11/16/22 166 lb 12.8 oz (75.7 kg)  09/28/22 170 lb 12.8 oz (77.5 kg)  09/20/22 173 lb 3.2 oz (78.6 kg)  09/07/22 173 lb 6.4 oz (78.7 kg)  08/17/22 171 lb 12.8 oz (77.9 kg)  07/16/22 165 lb 12.8 oz (75.2 kg)  06/11/22 160 lb 6.4 oz (72.8 kg)   Vital signs reviewed.  Nursing notes reviewed. Weight trend reviewed. Abnormalities and Problem-Specific physical exam findings:  pain in mid back.  No scoliosis. Able to stand up a walk well.  General Appearance:  No acute distress appreciable.   Well-groomed, healthy-appearing female.  Well proportioned with no abnormal fat distribution.  Good muscle tone. Skin: Clear and well-hydrated. Pulmonary:  Normal work of breathing at rest, no respiratory distress apparent. SpO2: 99 %  Musculoskeletal: All extremities are intact.  Neurological:  Awake, alert, oriented, and engaged.  No obvious focal neurological deficits or cognitive impairments.  Sensorium seems unclouded.   Speech is clear and coherent with  logical content. Psychiatric:  Appropriate mood, pleasant and cooperative demeanor, thoughtful and  engaged during the exam  Results Reviewed:     Latest Reference Range & Units 08/17/22 08:55 09/01/22 11:00 09/09/22 18:27 12/14/22 08:56 02/18/23 09:38  COMPREHENSIVE METABOLIC PANEL  Rpt !   Rpt !   Sodium 135 - 145 mEq/L 138   141   Potassium 3.5 - 5.1 mEq/L 4.3   4.1   Chloride 96 - 112 mEq/L 104   105   CO2 19 - 32 mEq/L 26   25   Glucose 70 - 99 mg/dL 811 (H)   80   BUN 6 - 23 mg/dL 18   16   Creatinine 9.14 - 1.20 mg/dL 7.82   9.56   Calcium 8.4 - 10.5 mg/dL 9.5   9.9   Phosphorus 2.3 - 4.6 mg/dL 4.5      Alkaline Phosphatase 39 - 117 U/L 65   81   Albumin 3.5 - 5.2 g/dL 4.4   4.4   Uric Acid, Serum 2.4 - 7.0 mg/dL 6.4      AST 0 - 37 U/L 16   26   ALT 0 - 35 U/L 13   26   Total Protein 6.0 - 8.3 g/dL 7.1   7.2   Total Bilirubin 0.2 - 1.2 mg/dL 0.2   0.2   GFR >21.30 mL/min 62.83   53.77 (L)   Total CHOL/HDL Ratio     3   Cholesterol 0 - 200 mg/dL    865   HDL Cholesterol >39.00 mg/dL    78.46   LDL (calc) 0 - 99 mg/dL    86   MICROALB/CREAT RATIO 0.0 - 30.0 mg/g 0.6      NonHDL     107.40   Triglycerides 0.0 - 149.0 mg/dL    962.9   VLDL 0.0 - 52.8 mg/dL    41.3   Ferritin 24.4 - 291.0 ng/mL 4.4 (L)      VITD 30.00 - 100.00 ng/mL 15.55 (L)      WBC 4.0 - 10.5 K/uL 5.1   5.6   RBC 3.87 - 5.11 Mil/uL 3.73 (L)   4.69   Hemoglobin 12.0 - 15.0 g/dL 01.0 (L)   27.2   HCT 53.6 - 46.0 % 31.8 (L)   41.4   MCV 78.0 - 100.0 fl 85.3   88.2   MCHC 30.0 - 36.0 g/dL 64.4   03.4   RDW 74.2 - 15.5 % 15.1   17.7 (H)   Platelets 150.0 - 400.0 K/uL 216.0   183.0   Hemoglobin A1C 4.6 - 6.5 %    6.2   TSH 0.35 - 5.50 uIU/mL 2.00   1.82   T4,Free(Direct) 0.60 - 1.60 ng/dL 5.95 (L)      URINALYSIS, ROUTINE W REFLEX MICROSCOPIC  Rpt !   Rpt !   Appearance Clear;Turbid;Slightly Cloudy;Cloudy  CLEAR   Cloudy !   Bilirubin Urine Negative  NEGATIVE   NEGATIVE   Color, Urine Yellow;Lt. Yellow;Straw;Dark Yellow;Amber;Green;Red;Brown  YELLOW   YELLOW   Hgb urine dipstick Negative  NEGATIVE    NEGATIVE   Ketones, ur Negative  NEGATIVE   NEGATIVE   Leukocytes,Ua Negative  TRACE !   SMALL !   Nitrite Negative  NEGATIVE   POSITIVE !   pH 5.0 - 8.0  6.5   6.0   Specific Gravity, Urine 1.000 - 1.030  1.015   1.020   Urine Glucose Negative  NEGATIVE   >=  1000 !   Urobilinogen, UA 0.0 - 1.0  0.2   0.2   Bacteria, UA None     Many(>50/hpf) !   Hyaline Casts, UA None  Presence of !      Mucus, UA None  Presence of !      RBC / HPF 0-2/hpf  none seen   0-2/hpf   Squamous Epithelial / HPF Rare(0-4/hpf)  Rare(0-4/hpf)   Rare(0-4/hpf)   WBC, UA 0-2/hpf  3-6/hpf !   TNTC(>50/hpf) !   Creatinine,U mg/dL 161.0      Microalb, Ur 0.0 - 1.9 mg/dL <9.6      Total Protein, Urine-UPE24 Negative  NEGATIVE   NEGATIVE   MR ABDOMEN W WO CONTRAST    Rpt    MR THORACIC SPINE WO CONTRAST      Rpt  US RENAL   Rpt     !: Data is abnormal (H): Data is abnormally high (L): Data is abnormally low Rpt: View report in Results Review for more information   MR THORACIC SPINE WO CONTRAST  Result Date: 02/23/2023 CLINICAL DATA:  Back pain for 8 months.  No known injury. EXAM: MRI THORACIC SPINE WITHOUT CONTRAST TECHNIQUE: Multiplanar, multisequence MR imaging of the thoracic spine was performed. No intravenous contrast was administered. COMPARISON:  None Available. FINDINGS: Alignment:  Physiologic. Vertebrae: No acute fracture, evidence of discitis, or aggressive bone lesion. Cord:  Normal signal and morphology. Paraspinal and other soft tissues: No acute paraspinal abnormality. Disc levels: Disc spaces:  Disc spaces are maintained. T1-T2: No disc protrusion, foraminal stenosis or central canal stenosis. T2-T3: No disc protrusion, foraminal stenosis or central canal stenosis. T3-T4: No disc protrusion, foraminal stenosis or central canal stenosis. T4-T5: No disc protrusion, foraminal stenosis or central canal stenosis. T5-T6: No disc protrusion, foraminal stenosis or central canal stenosis. T6-T7: No disc protrusion,  foraminal stenosis or central canal stenosis. T7-T8: No disc protrusion, foraminal stenosis or central canal stenosis. T8-T9: No disc protrusion, foraminal stenosis or central canal stenosis. T9-T10: No disc protrusion, foraminal stenosis or central canal stenosis. T10-T11: No disc protrusion, foraminal stenosis or central canal stenosis. Mild bilateral facet arthropathy. T11-T12: Minimal broad-based disc bulge eccentric towards the left. No foraminal or central canal stenosis. Mild bilateral facet arthropathy. IMPRESSION: 1. No acute osseous injury of the thoracic spine. 2. No significant thoracic spine disc protrusion, foraminal stenosis or central canal stenosis. Electronically Signed   By: Elige Ko M.D.   On: 02/23/2023 12:13       Signed: Lula Olszewski, MD 03/17/2023 7:49 AM

## 2023-03-14 NOTE — Patient Instructions (Addendum)
It was a pleasure seeing you today! Your health and satisfaction are our top priorities.   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 We also reviewed your current medications and discussed changes to help protect your kidneys. We noted your history of bladder leakage and anemia, likely due to your kidney disease.  Don't take any NSAIDS like diclofenac by mouth, in order to protect your kidneys.  YOUR PLAN:  -THORACIC SPINE PAIN: This is pain in the middle part of your back due to arthritis. We will continue with your current pain management strategies, which include deep tissue massage, acupuncture, and exercises. I will provide a referral for acupuncture to [facility] at [facility].  -MEDICATION MANAGEMENT: We discussed your current medications and decided to increase Effexor to 75mg  for potential additional pain relief. We will discontinue Voltaren due to potential kidney damage, but you should continue taking gabapentin as prescribed.  -NORMOCYTIC ANEMIA: This is a type of anemia where your red blood cells are normal size, but there are not enough of them. This is likely due to your kidney disease. We will order comprehensive blood workup including tests for reticulocyte count, ferritin, folate, B12, and thyroid function to check for potential causes of anemia, including leukemia.  INSTRUCTIONS:  Please follow up in 1-3 months for repeat labs and assessment of back pain management. Continue with your current pain management strategies and medication changes as discussed.  Next Steps:  [x]  Early Intervention: Schedule sooner appointment, call our on-call services, or go to emergency room if there is Increase in pain or discomfort New or worsening symptoms Sudden or severe changes in your health [x]  Flexible Follow-Up: We recommend a Return in about 3 months (around 06/14/2023) for review problems and  medications, chronic disease monitoring and management. for optimal routine care. This allows for progress monitoring and treatment adjustments. [x]  Preventive Care: Schedule your annual preventive care visit! It's typically covered by insurance and helps identify potential health issues early. [x]  Lab & X-ray Appointments: Incomplete tests scheduled today, or call to schedule. X-rays: [facility] (M-F, 8:30am-noon or 1pm-5pm). [x]  Medical Information Release: Sign a release form at front desk to obtain relevant medical information we don't have.  Making the Most of Our Focused (20 minute) Appointments:  [x]   Clearly state your top concerns at the beginning of the visit to focus our discussion [x]   If you anticipate you will need more time, please inform the front desk during scheduling - we can book multiple appointments in the same week. [x]   If you have transportation problems- use our convenient video appointments or ask about transportation support. [x]   We can get down to business faster if you use MyChart to update information before the visit and submit non-urgent questions before your visit. Thank you for taking the time to provide details through MyChart.  Let our nurse know and she can import this information into your encounter documents.  Arrival and Wait Times: [x]   Arriving on time ensures that everyone receives prompt attention. [x]   Early morning (8a) and afternoon (1p) appointments tend to have shortest wait times. [x]   Unfortunately, we cannot delay appointments for late arrivals or hold slots during phone calls.  Getting Answers and Following Up  [x]   Simple Questions & Concerns: For quick questions or basic follow-up after your visit, reach [place] at [phone] or MyChart messaging. [x]   Complex  Concerns: If your concern is more complex, scheduling an appointment might be best. Discuss this with the staff to find the most suitable option. [x]   Lab & Imaging  Results: We'll contact you directly if results are abnormal or you don't use MyChart. Most normal results will be on MyChart within 2-3 business days, with a review message from Dr. Jon Billings. Haven't heard back in 2 weeks? Need results sooner? Contact us at [phone]. [x]   Referrals: Our referral coordinator will manage specialist referrals. The specialist's office should contact you within 2 weeks to schedule an appointment. Call us if you haven't heard from them after 2 weeks.  Staying Connected  [x]   MyChart: Activate your MyChart for the fastest way to access results and message [place]. See the last page of this paperwork for instructions on how to activate.  Bring to Your Next Appointment  [x]   Medications: Please bring all your medication bottles to your next appointment to ensure we have an accurate record of your prescriptions. [x]   Health Diaries: If you're monitoring any health conditions at home, keeping a diary of your readings can be very helpful for discussions at your next appointment.  Billing  [x]   X-ray & Lab Orders: These are billed by separate companies. Contact the invoicing company directly for questions or concerns. [x]   Visit Charges: Discuss any billing inquiries with our administrative services team.  Your Satisfaction Matters  [x]   Share Your Experience: We strive for your satisfaction! If you have any complaints, or preferably compliments, please let Dr. Jon Billings know directly or contact our Practice Administrators, Edwena Felty or [employer], by asking at the front desk.   Reviewing Your Records  [x]   Review this early draft of your clinical encounter notes below and the final encounter summary tomorrow on MyChart after its been completed.   Chronic back pain greater than 3 months duration -     Diclofenac Sodium; Apply 2 g topically 4 (four) times daily.  Dispense: 400 g; Refill: 11 -     Ambulatory referral for Acupuncture -     Venlafaxine HCl ER; Take  1 capsule (75 mg total) by mouth daily with breakfast.  Dispense: 90 capsule; Refill: 3  Long-term current use of injectable noninsulin antidiabetic medication  Type 2 diabetes mellitus with stage 3 chronic kidney disease, without long-term current use of insulin, unspecified whether stage 3a or 3b CKD (HCC) -     Diclofenac Sodium; Apply 2 g topically 4 (four) times daily.  Dispense: 400 g; Refill: 11 -     Ambulatory referral for Acupuncture -     Venlafaxine HCl ER; Take 1 capsule (75 mg total) by mouth daily with breakfast.  Dispense: 90 capsule; Refill: 3 -     Tirzepatide; Inject 5 mg into the skin once a week.  Dispense: 6 mL; Refill: 11 -     CBC with Differential/Platelet -     Comprehensive metabolic panel -     Lipid panel -     Microalbumin / creatinine urine ratio; Future  Stage 3 chronic kidney disease, unspecified whether stage 3a or 3b CKD (HCC) -     VITAMIN D 25 Hydroxy (Vit-D Deficiency, Fractures) -     Uric acid -     Parathyroid hormone, intact (no Ca); Future -     Urinalysis, Routine w reflex microscopic; Future  Acquired hypothyroidism -     TSH + free T4  Normocytic anemia -     Reticulocytes; Future -  Iron, TIBC and Ferritin Panel -     B12 and Folate Panel -     Pathologist smear review; Future -     Parathyroid hormone, intact (no Ca); Future

## 2023-03-15 ENCOUNTER — Encounter: Payer: Self-pay | Admitting: Internal Medicine

## 2023-03-15 ENCOUNTER — Other Ambulatory Visit: Payer: Self-pay | Admitting: Internal Medicine

## 2023-03-15 DIAGNOSIS — N183 Chronic kidney disease, stage 3 unspecified: Secondary | ICD-10-CM

## 2023-03-15 LAB — COMPREHENSIVE METABOLIC PANEL
ALT: 26 U/L (ref 0–35)
AST: 25 U/L (ref 0–37)
Albumin: 4 g/dL (ref 3.5–5.2)
Alkaline Phosphatase: 74 U/L (ref 39–117)
BUN: 13 mg/dL (ref 6–23)
CO2: 25 mEq/L (ref 19–32)
Calcium: 8.9 mg/dL (ref 8.4–10.5)
Chloride: 103 mEq/L (ref 96–112)
Creatinine, Ser: 0.79 mg/dL (ref 0.40–1.20)
GFR: 71.22 mL/min (ref >60.00)
Glucose, Bld: 78 mg/dL (ref 70–99)
Potassium: 4.2 mEq/L (ref 3.5–5.1)
Sodium: 137 mEq/L (ref 135–145)
Total Bilirubin: 0.2 mg/dL (ref 0.2–1.2)
Total Protein: 6.5 g/dL (ref 6.0–8.3)

## 2023-03-15 LAB — LIPID PANEL
Cholesterol: 177 mg/dL (ref 0–200)
HDL: 43.2 mg/dL (ref >39.00)
LDL Cholesterol: 96 mg/dL (ref 0–99)
NonHDL: 133.65
Total CHOL/HDL Ratio: 4
Triglycerides: 187 mg/dL — ABNORMAL HIGH (ref 0.0–149.0)
VLDL: 37.4 mg/dL (ref 0.0–40.0)

## 2023-03-15 LAB — CBC WITH DIFFERENTIAL/PLATELET
Basophils Absolute: 0 10*3/uL (ref 0.0–0.1)
Basophils Relative: 0.8 % (ref 0.0–3.0)
Eosinophils Absolute: 0.1 10*3/uL (ref 0.0–0.7)
Eosinophils Relative: 1.3 % (ref 0.0–5.0)
HCT: 37.3 % (ref 36.0–46.0)
Hemoglobin: 12.1 g/dL (ref 12.0–15.0)
Lymphocytes Relative: 46.6 % — ABNORMAL HIGH (ref 12.0–46.0)
Lymphs Abs: 2.5 10*3/uL (ref 0.7–4.0)
MCHC: 32.5 g/dL (ref 30.0–36.0)
MCV: 92.1 fl (ref 78.0–100.0)
Monocytes Absolute: 0.3 10*3/uL (ref 0.1–1.0)
Monocytes Relative: 5 % (ref 3.0–12.0)
Neutro Abs: 2.5 10*3/uL (ref 1.4–7.7)
Neutrophils Relative %: 46.3 % (ref 43.0–77.0)
Platelets: 169 10*3/uL (ref 150.0–400.0)
RBC: 4.05 Mil/uL (ref 3.87–5.11)
RDW: 15 % (ref 11.5–15.5)
WBC: 5.3 10*3/uL (ref 4.0–10.5)

## 2023-03-15 LAB — B12 AND FOLATE PANEL
Folate: 3.3 ng/mL — ABNORMAL LOW (ref >5.9)
Vitamin B-12: >1500 pg/mL — ABNORMAL HIGH (ref 211–911)

## 2023-03-15 LAB — URIC ACID: Uric Acid, Serum: 4.6 mg/dL (ref 2.4–7.0)

## 2023-03-15 LAB — VITAMIN D 25 HYDROXY (VIT D DEFICIENCY, FRACTURES): VITD: 40.43 ng/mL (ref 30.00–100.00)

## 2023-03-15 MED ORDER — TIRZEPATIDE 7.5 MG/0.5ML ~~LOC~~ SOAJ
7.5000 mg | SUBCUTANEOUS | 11 refills | Status: DC
Start: 2023-03-15 — End: 2023-07-12

## 2023-03-16 ENCOUNTER — Other Ambulatory Visit: Payer: Self-pay

## 2023-03-16 DIAGNOSIS — E1122 Type 2 diabetes mellitus with diabetic chronic kidney disease: Secondary | ICD-10-CM

## 2023-03-16 DIAGNOSIS — H43811 Vitreous degeneration, right eye: Secondary | ICD-10-CM | POA: Diagnosis not present

## 2023-03-16 DIAGNOSIS — D649 Anemia, unspecified: Secondary | ICD-10-CM | POA: Diagnosis not present

## 2023-03-16 DIAGNOSIS — N183 Chronic kidney disease, stage 3 unspecified: Secondary | ICD-10-CM

## 2023-03-16 DIAGNOSIS — E039 Hypothyroidism, unspecified: Secondary | ICD-10-CM | POA: Diagnosis not present

## 2023-03-16 DIAGNOSIS — Z947 Corneal transplant status: Secondary | ICD-10-CM | POA: Diagnosis not present

## 2023-03-16 DIAGNOSIS — Z961 Presence of intraocular lens: Secondary | ICD-10-CM | POA: Diagnosis not present

## 2023-03-16 LAB — URINALYSIS, ROUTINE W REFLEX MICROSCOPIC
Bilirubin Urine: NEGATIVE
Hgb urine dipstick: NEGATIVE
Ketones, ur: NEGATIVE
Nitrite: NEGATIVE
Specific Gravity, Urine: 1.01 (ref 1.000–1.030)
Total Protein, Urine: NEGATIVE
Urine Glucose: >=1000 — AB
Urobilinogen, UA: 0.2 (ref 0.0–1.0)
pH: 7 (ref 5.0–8.0)

## 2023-03-16 LAB — RETICULOCYTES: ABS Retic: 55640 cells/uL (ref 20000–80000)

## 2023-03-16 NOTE — Addendum Note (Signed)
Addended by: Laddie Aquas A on: 03/16/2023 09:28 AM   Modules accepted: Orders

## 2023-03-16 NOTE — Addendum Note (Signed)
Addended by: Laddie Aquas A on: 03/16/2023 10:09 AM   Modules accepted: Orders

## 2023-03-17 DIAGNOSIS — R8271 Bacteriuria: Secondary | ICD-10-CM | POA: Insufficient documentation

## 2023-03-17 LAB — PATHOLOGIST SMEAR REVIEW

## 2023-03-17 LAB — PARATHYROID HORMONE, INTACT (NO CA): PTH: 60 pg/mL (ref 16–77)

## 2023-03-17 LAB — MICROALBUMIN / CREATININE URINE RATIO
Creatinine,U: 64 mg/dL
Microalb Creat Ratio: 1.1 mg/g (ref 0.0–30.0)
Microalb, Ur: <0.7 mg/dL (ref 0.0–1.9)

## 2023-03-17 LAB — IRON,TIBC AND FERRITIN PANEL
%SAT: 19 % (calc) (ref 16–45)
Ferritin: 9 ng/mL — ABNORMAL LOW (ref 16–288)
Iron: 65 ug/dL (ref 45–160)
TIBC: 340 mcg/dL (calc) (ref 250–450)

## 2023-03-17 LAB — RETICULOCYTES: Retic Ct Pct: 1.3 %

## 2023-03-17 LAB — TSH+FREE T4: TSH W/REFLEX TO FT4: 0.81 mIU/L (ref 0.40–4.50)

## 2023-03-17 NOTE — Addendum Note (Signed)
Addended by: Lula Olszewski on: 03/17/2023 07:52 AM   Modules accepted: Orders

## 2023-03-17 NOTE — Progress Notes (Signed)
Dear Ms.Gurrero   I hope this message finds you well. I am writing to inform you about the results of your recent urine test.  Our records indicate that there has been a consistent presence of bacterial colonization in your urine for the past three months. This is not uncommon, especially in patients with Chronic Kidney Disease (CKD) stage 3, like yourself.  To better understand the nature of this bacteria, we have decided to send your sample for further culture. This will help [place] identify the specific type of bacteria and determine the most effective treatment plan, if necessary.  If you are experiencing any symptoms of a urinary tract infection (UTI), such as a burning sensation during urination, frequent urge to urinate, lower abdominal pain, or cloudy and strong-smelling urine, it is important that you inform us immediately. In such cases, we may need to prescribe antibiotics to eradicate the bacteria.  However, if you are not experiencing any symptoms at all, it may be best to leave the bacteria as it is. This is because the presence of this bacteria can sometimes prevent more harmful bacteria from colonizing the urinary tract.  Please remember that this is a general advice and the final treatment decision will be based on your specific condition and symptoms. We will discuss this in more detail during your next appointment.  Thank you for your attention to this matter. Please do not hesitate to reach out if you have any questions or concerns.  Best regards,  Lula Olszewski, MD  03/17/2023 7:50 AM   [facility]:  [phone]

## 2023-03-18 NOTE — Progress Notes (Signed)
All pretty normal except urine Even the prior kidney disease seems to have gone away MyChart comments provided for patient education and problem list updated.

## 2023-03-22 DIAGNOSIS — N3946 Mixed incontinence: Secondary | ICD-10-CM | POA: Diagnosis not present

## 2023-03-25 ENCOUNTER — Telehealth: Payer: Self-pay | Admitting: Internal Medicine

## 2023-03-25 NOTE — Telephone Encounter (Signed)
Patient has dropped off patient assistance forms with her portion completed.  We have placed in Dr. Kandra Nicolas basket to be processed.

## 2023-04-07 ENCOUNTER — Other Ambulatory Visit: Payer: Self-pay | Admitting: Internal Medicine

## 2023-04-07 DIAGNOSIS — K219 Gastro-esophageal reflux disease without esophagitis: Secondary | ICD-10-CM

## 2023-04-08 NOTE — Telephone Encounter (Signed)
This has already been taken care of

## 2023-04-20 DIAGNOSIS — Z947 Corneal transplant status: Secondary | ICD-10-CM | POA: Diagnosis not present

## 2023-04-20 DIAGNOSIS — H43811 Vitreous degeneration, right eye: Secondary | ICD-10-CM | POA: Diagnosis not present

## 2023-04-20 DIAGNOSIS — Z961 Presence of intraocular lens: Secondary | ICD-10-CM | POA: Diagnosis not present

## 2023-04-22 DIAGNOSIS — M546 Pain in thoracic spine: Secondary | ICD-10-CM | POA: Diagnosis not present

## 2023-04-22 DIAGNOSIS — M545 Low back pain, unspecified: Secondary | ICD-10-CM | POA: Diagnosis not present

## 2023-05-06 ENCOUNTER — Ambulatory Visit: Payer: HMO | Admitting: Internal Medicine

## 2023-05-06 ENCOUNTER — Other Ambulatory Visit: Payer: Self-pay | Admitting: Internal Medicine

## 2023-05-06 ENCOUNTER — Encounter: Payer: Self-pay | Admitting: Internal Medicine

## 2023-05-06 DIAGNOSIS — Z9889 Other specified postprocedural states: Secondary | ICD-10-CM | POA: Diagnosis not present

## 2023-05-06 DIAGNOSIS — R252 Cramp and spasm: Secondary | ICD-10-CM

## 2023-05-06 DIAGNOSIS — S3992XA Unspecified injury of lower back, initial encounter: Secondary | ICD-10-CM | POA: Insufficient documentation

## 2023-05-06 DIAGNOSIS — S3992XD Unspecified injury of lower back, subsequent encounter: Secondary | ICD-10-CM | POA: Diagnosis not present

## 2023-05-06 DIAGNOSIS — N3281 Overactive bladder: Secondary | ICD-10-CM

## 2023-05-06 MED ORDER — PREDNISONE 20 MG PO TABS
ORAL_TABLET | ORAL | 0 refills | Status: DC
Start: 2023-05-06 — End: 2023-06-08

## 2023-05-06 MED ORDER — CYCLOBENZAPRINE HCL 10 MG PO TABS
ORAL_TABLET | ORAL | 3 refills | Status: DC
Start: 2023-05-06 — End: 2023-08-31

## 2023-05-06 NOTE — Assessment & Plan Note (Addendum)
Given this patient's history and physical exam, the most likely cause for her back pain is degenerative osteoarthritis and degenerative disk disease of her vertebral column.  I have considered and concluded the patient has a very low likelihood of having one of the following serious causes of back pain: bone tumor (no tumors/masses), acute bone fracture (no severe trauma or bone loss), aortic aneurysm/dissection(no high blood pressure, long term smoking, ripping/tearing sensations), vertebral disk infection (fevers,infections, IVDU), or pyelonephritis (no pain radiating to groin, costovertebral angle tenderness).   She also has no evidence for acute spinal compression (no weakness/numbness, loss of bowel or bladder function).  She was advised to go to ER if symptom(s) suggestive of these develop.  These red flag alarm symptom(s) are copied here to his chart for reference.   Standard treatment offered & documented here: For acute pain, rest, intermittent application of cold packs (later, may switch to heat, but do not sleep on heating pad), analgesics and muscle relaxants are recommended.  For more chronic pain, avoid NSAIDs due to chronic kidney disease  Proper lifting with avoidance of heavy lifting discussed.  Provided instructions in AVS for how to do stretches and discussed using it for a home back care exercise program with flexion exercise routine.  Physical Therapy and Xray offered and/or ordered. she will call or return to clinic prn if these symptoms worsen or fail to improve as anticipated.

## 2023-05-06 NOTE — Progress Notes (Unsigned)
[facility]: [phone]   Routine Medical Office Visit  Patient:  Laura Farrell      Age: 77 y.o.       Sex:  female  Date:   05/06/2023 Patient Care Team: Lula Olszewski, MD as PCP - General (Internal Medicine) Bernette Redbird, MD as Consulting Physician (Gastroenterology) Glyn Ade, PA-C as Physician Assistant (Dermatology) Gaylord Shih, [facility] (Specialist) Holli Humbles, MD as Referring Physician (Ophthalmology) Tat, Octaviano Batty, DO as Consulting Physician (Neurology) Reece Packer, MD (Oncology) Eugenia Mcalpine, MD (Inactive) as Consulting Physician (Orthopedic Surgery) Maeola Harman, MD as Consulting Physician (Neurosurgery) Dahlia Byes, [facility] (Inactive) as Pharmacist (Pharmacist) Today's Healthcare Provider: Lula Olszewski, MD   Assessment and Plan:   There are no diagnoses linked to this encounter.  Assessment and Plan               ED Discharge Orders     None       Recommended follow up: No follow-ups on file.  No future appointments.         Clinical Presentation:    77 y.o. female who has Essential tremor; HLP (hyperkeratosis lenticularis perstans); OSA (obstructive sleep apnea); GERD (gastroesophageal reflux disease); Hyperlipidemia; Hypothyroidism; History of cluster headache; Bilateral hydronephrosis; At high risk for breast cancer; Chronic tension headaches; Menopausal hot flushes; Family history of breast cancer in sister; Osteoarthritis of left knee; Status post corneal transplant; Tubular adenoma of colon; Rosacea; Mixed stress and urge urinary incontinence; Screening for osteoporosis; Lumbar radiculopathy; Hearing loss; History of back surgery; Hyperkalemia; CKD (chronic kidney disease) stage 3, GFR 30-59 ml/min (HCC); Anemia in chronic kidney disease (CKD); Acquired renal cyst of right kidney; Family history of pancreatic cancer; Hiatal hernia; History of penetrating keratoplasty; Overweight; Chronic back pain  greater than 3 months duration; Type 2 diabetes mellitus with stage 3 chronic kidney disease, without long-term current use of insulin (HCC); Long-term current use of injectable noninsulin antidiabetic medication; and Bacteriuria on their problem list. Her reasons/main concerns/chief complaints for today's office visit are Back Pain (MCV about two weeks ago. X-ray did not show any new findings.)   AI-Extracted: Discussed the use of AI scribe software for clinical note transcription with the patient, who gave verbal consent to proceed.  History of Present Illness             has a past medical history of Acquired spondylolisthesis (02/16/2017), Acute kidney injury (HCC) (06/21/2022), Cellulitis (06/11/2022), Cerumen debris on tympanic membrane (07/16/2022), Chronic tension headaches (08/14/2018), CKD (chronic kidney disease) stage 3, GFR 30-59 ml/min (HCC) (07/16/2022), Diabetes mellitus, Displacement of lumbar intervertebral disc without myelopathy (04/03/2013), Headache(784.0), Hearing loss (06/11/2022), History of back surgery (06/11/2022), History of colon polyps (08/17/2022), HLP (hyperkeratosis lenticularis perstans), Hot flashes, Hyperlipidemia, Lumbar spondylosis (06/11/2022), Menopausal hot flushes (08/14/2018), Nephrolithiasis, Over weight, Overweight (09/28/2022), Prediabetes (12/27/2019), Thyroid disease, Tremor (02/16/2017), Tubular adenoma of colon (12/27/2019), and Urine incontinence. Current Outpatient Medications on File Prior to Visit  Medication Sig   aspirin 81 MG tablet Take 81 mg by mouth daily.   atorvastatin (LIPITOR) 80 MG tablet Take 1 tablet (80 mg total) by mouth daily.   buPROPion ER (WELLBUTRIN SR) 100 MG 12 hr tablet Take 1 tablet (100 mg total) by mouth in the morning.   CREAM BASE EX Apply topically 2 (two) times daily.   cyclobenzaprine (FLEXERIL) 10 MG tablet TAKE ONE TABLET BY MOUTH THREE TIMES DAILY AS NEEDED FOR MUSCLE SPASMS   diclofenac Sodium (VOLTAREN) 1 %  GEL  Apply 2 g topically 4 (four) times daily.   Diclofenac Sodium 3 % GEL Apply 1 Application topically every 4 (four) hours.   empagliflozin (JARDIANCE) 10 MG TABS tablet Take 1 tablet (10 mg total) by mouth daily before breakfast.   estradiol (ESTRACE) 0.1 MG/GM vaginal cream Place a fingertip amount of cream in the vagina nightly x 2 weeks, then use every other night   famotidine (PEPCID) 20 MG tablet Take 20 mg by mouth 2 (two) times daily.   famotidine-calcium carbonate-magnesium hydroxide (PEPCID COMPLETE) 10-800-165 MG chewable tablet Chew 1 tablet by mouth daily as needed. Use for as needed heartburn only   ferrous sulfate 325 (65 FE) MG tablet TAKE 1 TABLET BY MOUTH DAILY   gabapentin (NEURONTIN) 600 MG tablet Take 1 tablet (600 mg total) by mouth 2 (two) times daily.   levothyroxine (SYNTHROID) 100 MCG tablet Take 1 tablet (100 mcg total) by mouth daily.   metFORMIN (GLUCOPHAGE) 500 MG tablet Take 1 tablet (500 mg total) by mouth 2 (two) times daily with a meal.   metroNIDAZOLE (METROGEL) 0.75 % gel Apply 1 Application topically 2 (two) times daily.   omeprazole (PRILOSEC) 20 MG capsule TAKE 1 CAPSULE BY MOUTH DAILY. USE DAILY TO ELIMINATE HEARTBURN ALL DAY.   prednisoLONE acetate (PRED FORTE) 1 % ophthalmic suspension PLACE 1 DROP INTO LEFT EYE DAILY   primidone (MYSOLINE) 50 MG tablet 2 in the AM, 4 in the PM   propranolol (INDERAL) 10 MG tablet Take 1 tablet (10 mg total) by mouth 2 (two) times daily.   solifenacin (VESICARE) 10 MG tablet Take 1 tablet (10 mg total) by mouth daily.   tirzepatide (MOUNJARO) 7.5 MG/0.5ML Pen Inject 7.5 mg into the skin once a week. Replaces 5 mg dose   traMADol (ULTRAM) 50 MG tablet Take 1 tablet (50 mg total) by mouth every 6 (six) hours as needed.   triamcinolone cream (KENALOG) 0.1 %    trospium (SANCTURA) 20 MG tablet Take by mouth.   venlafaxine XR (EFFEXOR XR) 75 MG 24 hr capsule Take 1 capsule (75 mg total) by mouth daily with breakfast.    trospium (SANCTURA) 20 MG tablet Take 20 mg by mouth 2 (two) times daily. (Patient not taking: Reported on 05/06/2023)   No current facility-administered medications on file prior to visit.   There are no discontinued medications.       Clinical Data Analysis:   Physical Exam  BP 106/67 (BP Location: Right Arm, Patient Position: Sitting)   Pulse 78   Temp 97.8 F (36.6 C) (Temporal)   Ht 5\' 4"  (1.626 m)   Wt 137 lb 6.4 oz (62.3 kg)   SpO2 99%   BMI 23.58 kg/m  Wt Readings from Last 10 Encounters:  05/06/23 137 lb 6.4 oz (62.3 kg)  03/14/23 150 lb 6.4 oz (68.2 kg)  02/24/23 147 lb 6.4 oz (66.9 kg)  12/14/22 161 lb 12.8 oz (73.4 kg)  11/16/22 166 lb 12.8 oz (75.7 kg)  09/28/22 170 lb 12.8 oz (77.5 kg)  09/20/22 173 lb 3.2 oz (78.6 kg)  09/07/22 173 lb 6.4 oz (78.7 kg)  08/17/22 171 lb 12.8 oz (77.9 kg)  07/16/22 165 lb 12.8 oz (75.2 kg)   Vital signs reviewed.  Nursing notes reviewed. Weight trend reviewed. Abnormalities and Problem-Specific physical exam findings:  ***  General Appearance:  No acute distress appreciable.   Well-groomed, healthy-appearing female.  Well proportioned with no abnormal fat distribution.  Good muscle tone. Skin: Clear and  well-hydrated. Pulmonary:  Normal work of breathing at rest, no respiratory distress apparent. SpO2: 99 %  Musculoskeletal: All extremities are intact.  Neurological:  Awake, alert, oriented, and engaged.  No obvious focal neurological deficits or cognitive impairments.  Sensorium seems unclouded.   Speech is clear and coherent with logical content. Psychiatric:  Appropriate mood, pleasant and cooperative demeanor, thoughtful and engaged during the exam  Results Reviewed:  {Insert previous labs (optional):23779}  {See past labs  Heme  Chem  Endocrine  Serology  Results Review (optional):1}  No results found for any visits on 05/06/23.  Orders Only on 03/16/2023  Component Date Value   PTH 03/16/2023 60    Path Review  03/16/2023     Retic Ct Pct 03/16/2023 1.3    ABS Retic 03/16/2023 55,640   Office Visit on 03/14/2023  Component Date Value   WBC 03/14/2023 5.3    RBC 03/14/2023 4.05    Hemoglobin 03/14/2023 12.1    HCT 03/14/2023 37.3    MCV 03/14/2023 92.1    MCHC 03/14/2023 32.5    RDW 03/14/2023 15.0    Platelets 03/14/2023 169.0    Neutrophils Relative % 03/14/2023 46.3    Lymphocytes Relative 03/14/2023 46.6 (H)    Monocytes Relative 03/14/2023 5.0    Eosinophils Relative 03/14/2023 1.3    Basophils Relative 03/14/2023 0.8    Neutro Abs 03/14/2023 2.5    Lymphs Abs 03/14/2023 2.5    Monocytes Absolute 03/14/2023 0.3    Eosinophils Absolute 03/14/2023 0.1    Basophils Absolute 03/14/2023 0.0    Sodium 03/14/2023 137    Potassium 03/14/2023 4.2    Chloride 03/14/2023 103    CO2 03/14/2023 25    Glucose, Bld 03/14/2023 78    BUN 03/14/2023 13    Creatinine, Ser 03/14/2023 0.79    Total Bilirubin 03/14/2023 0.2    Alkaline Phosphatase 03/14/2023 74    AST 03/14/2023 25    ALT 03/14/2023 26    Total Protein 03/14/2023 6.5    Albumin 03/14/2023 4.0    GFR 03/14/2023 71.22    Calcium 03/14/2023 8.9    Cholesterol 03/14/2023 177    Triglycerides 03/14/2023 187.0 (H)    HDL 03/14/2023 43.20    VLDL 03/14/2023 37.4    LDL Cholesterol 03/14/2023 96    Total CHOL/HDL Ratio 03/14/2023 4    NonHDL 03/14/2023 133.65    TSH W/REFLEX TO FT4 03/14/2023 0.81    VITD 03/14/2023 40.43    Iron 03/14/2023 65    TIBC 03/14/2023 340    %SAT 03/14/2023 19    Ferritin 03/14/2023 9 (L)    Vitamin B-12 03/14/2023 >1500 (H)    Folate 03/14/2023 3.3 (L)    Uric Acid, Serum 03/14/2023 4.6    Microalb, Ur 03/16/2023 <0.7    Creatinine,U 03/16/2023 64.0    Microalb Creat Ratio 03/16/2023 1.1    Color, Urine 03/16/2023 YELLOW    APPearance 03/16/2023 Cloudy (A)    Specific Gravity, Urine 03/16/2023 1.010    pH 03/16/2023 7.0    Total Protein, Urine 03/16/2023 NEGATIVE    Urine Glucose 03/16/2023  >=1000 (A)    Ketones, ur 03/16/2023 NEGATIVE    Bilirubin Urine 03/16/2023 NEGATIVE    Hgb urine dipstick 03/16/2023 NEGATIVE    Urobilinogen, UA 03/16/2023 0.2    Leukocytes,Ua 03/16/2023 MODERATE (A)    Nitrite 03/16/2023 NEGATIVE    WBC, UA 03/16/2023 21-50/hpf (A)    RBC / HPF 03/16/2023 0-2/hpf    Squamous Epithelial /  HPF 03/16/2023 Rare(0-4/hpf)    Bacteria, UA 03/16/2023 Many(>50/hpf) (A)   Office Visit on 12/14/2022  Component Date Value   Color, Urine 12/14/2022 YELLOW    APPearance 12/14/2022 Cloudy (A)    Specific Gravity, Urine 12/14/2022 1.020    pH 12/14/2022 6.0    Total Protein, Urine 12/14/2022 NEGATIVE    Urine Glucose 12/14/2022 >=1000 (A)    Ketones, ur 12/14/2022 NEGATIVE    Bilirubin Urine 12/14/2022 NEGATIVE    Hgb urine dipstick 12/14/2022 NEGATIVE    Urobilinogen, UA 12/14/2022 0.2    Leukocytes,Ua 12/14/2022 SMALL (A)    Nitrite 12/14/2022 POSITIVE (A)    WBC, UA 12/14/2022 TNTC(>50/hpf) (A)    RBC / HPF 12/14/2022 0-2/hpf    Squamous Epithelial / HPF 12/14/2022 Rare(0-4/hpf)    Bacteria, UA 12/14/2022 Many(>50/hpf) (A)    WBC 12/14/2022 5.6    RBC 12/14/2022 4.69    Platelets 12/14/2022 183.0    Hemoglobin 12/14/2022 13.7    HCT 12/14/2022 41.4    MCV 12/14/2022 88.2    MCHC 12/14/2022 33.0    RDW 12/14/2022 17.7 (H)    Sodium 12/14/2022 141    Potassium 12/14/2022 4.1    Chloride 12/14/2022 105    CO2 12/14/2022 25    Glucose, Bld 12/14/2022 80    BUN 12/14/2022 16    Creatinine, Ser 12/14/2022 1.00    Total Bilirubin 12/14/2022 0.2    Alkaline Phosphatase 12/14/2022 81    AST 12/14/2022 26    ALT 12/14/2022 26    Total Protein 12/14/2022 7.2    Albumin 12/14/2022 4.4    GFR 12/14/2022 53.77 (L)    Calcium 12/14/2022 9.9    Hgb A1c MFr Bld 12/14/2022 6.2    Cholesterol 12/14/2022 154    Triglycerides 12/14/2022 105.0    HDL 12/14/2022 46.20    VLDL 12/14/2022 21.0    LDL Cholesterol 12/14/2022 86    Total CHOL/HDL Ratio  12/14/2022 3    NonHDL 12/14/2022 107.40    TSH 12/14/2022 1.82   Office Visit on 08/17/2022  Component Date Value   WBC 08/17/2022 5.1    RBC 08/17/2022 3.73 (L)    Platelets 08/17/2022 216.0    Hemoglobin 08/17/2022 10.3 (L)    HCT 08/17/2022 31.8 (L)    MCV 08/17/2022 85.3    MCHC 08/17/2022 32.4    RDW 08/17/2022 15.1    Ferritin 08/17/2022 4.4 (L)    TSH 08/17/2022 2.00    Free T4 08/17/2022 0.59 (L)    Sodium 08/17/2022 138    Potassium 08/17/2022 4.3    Chloride 08/17/2022 104    CO2 08/17/2022 26    Glucose, Bld 08/17/2022 117 (H)    BUN 08/17/2022 18    Creatinine, Ser 08/17/2022 0.88    Total Bilirubin 08/17/2022 0.2    Alkaline Phosphatase 08/17/2022 65    AST 08/17/2022 16    ALT 08/17/2022 13    Total Protein 08/17/2022 7.1    Albumin 08/17/2022 4.4    GFR 08/17/2022 62.83    Calcium 08/17/2022 9.5    VITD 08/17/2022 15.55 (L)    Uric Acid, Serum 08/17/2022 6.4    Phosphorus 08/17/2022 4.5    Microalb, Ur 08/17/2022 <0.7    Creatinine,U 08/17/2022 108.8    Microalb Creat Ratio 08/17/2022 0.6    Color, Urine 08/17/2022 YELLOW    APPearance 08/17/2022 CLEAR    Specific Gravity, Urine 08/17/2022 1.015    pH 08/17/2022 6.5    Total Protein, Urine 08/17/2022 NEGATIVE  Urine Glucose 08/17/2022 NEGATIVE    Ketones, ur 08/17/2022 NEGATIVE    Bilirubin Urine 08/17/2022 NEGATIVE    Hgb urine dipstick 08/17/2022 NEGATIVE    Urobilinogen, UA 08/17/2022 0.2    Leukocytes,Ua 08/17/2022 TRACE (A)    Nitrite 08/17/2022 NEGATIVE    WBC, UA 08/17/2022 3-6/hpf (A)    RBC / HPF 08/17/2022 none seen    Mucus, UA 08/17/2022 Presence of (A)    Squamous Epithelial / HPF 08/17/2022 Rare(0-4/hpf)    Hyaline Casts, UA 08/17/2022 Presence of (A)   Office Visit on 07/16/2022  Component Date Value   Color, Urine 07/16/2022 YELLOW    APPearance 07/16/2022 CLEAR    Specific Gravity, Urine 07/16/2022 1.015    pH 07/16/2022 6.0    Total Protein, Urine 07/16/2022 NEGATIVE     Urine Glucose 07/16/2022 NEGATIVE    Ketones, ur 07/16/2022 NEGATIVE    Bilirubin Urine 07/16/2022 NEGATIVE    Hgb urine dipstick 07/16/2022 NEGATIVE    Urobilinogen, UA 07/16/2022 0.2    Leukocytes,Ua 07/16/2022 TRACE (A)    Nitrite 07/16/2022 NEGATIVE    WBC, UA 07/16/2022 3-6/hpf (A)    RBC / HPF 07/16/2022 none seen    Squamous Epithelial / HPF 07/16/2022 Rare(0-4/hpf)   Lab on 07/13/2022  Component Date Value   Sodium 07/13/2022 139    Potassium 07/13/2022 3.9    Chloride 07/13/2022 104    CO2 07/13/2022 25    Glucose, Bld 07/13/2022 106 (H)    BUN 07/13/2022 22    Creatinine, Ser 07/13/2022 1.11    GFR 07/13/2022 47.58 (L)    Calcium 07/13/2022 9.9    Color, Urine 07/13/2022 YELLOW    APPearance 07/13/2022 CLEAR    Specific Gravity, Urine 07/13/2022 <=1.005 (A)    pH 07/13/2022 6.5    Total Protein, Urine 07/13/2022 NEGATIVE    Urine Glucose 07/13/2022 NEGATIVE    Ketones, ur 07/13/2022 NEGATIVE    Bilirubin Urine 07/13/2022 NEGATIVE    Hgb urine dipstick 07/13/2022 NEGATIVE    Urobilinogen, UA 07/13/2022 0.2    Leukocytes,Ua 07/13/2022 SMALL (A)    Nitrite 07/13/2022 NEGATIVE    WBC, UA 07/13/2022 0-2/hpf    RBC / HPF 07/13/2022 none seen    Squamous Epithelial / HPF 07/13/2022 Rare(0-4/hpf)   Lab on 06/21/2022  Component Date Value   Color, Urine 06/21/2022 YELLOW    APPearance 06/21/2022 CLEAR    Specific Gravity, Urine 06/21/2022 1.020    pH 06/21/2022 6.0    Total Protein, Urine 06/21/2022 NEGATIVE    Urine Glucose 06/21/2022 NEGATIVE    Ketones, ur 06/21/2022 NEGATIVE    Bilirubin Urine 06/21/2022 NEGATIVE    Hgb urine dipstick 06/21/2022 NEGATIVE    Urobilinogen, UA 06/21/2022 0.2    Leukocytes,Ua 06/21/2022 SMALL (A)    Nitrite 06/21/2022 NEGATIVE    WBC, UA 06/21/2022 3-6/hpf (A)    RBC / HPF 06/21/2022 0-2/hpf    Mucus, UA 06/21/2022 Presence of (A)    Squamous Epithelial / HPF 06/21/2022 Rare(0-4/hpf)    Sodium 06/21/2022 142    Potassium  06/21/2022 5.3 No hemolysis seen (H)    Chloride 06/21/2022 108    CO2 06/21/2022 26    Glucose, Bld 06/21/2022 109 (H)    BUN 06/21/2022 22    Creatinine, Ser 06/21/2022 1.23 (H)    GFR 06/21/2022 42.08 (L)    Calcium 06/21/2022 9.9   Lab on 06/18/2022  Component Date Value   WBC 06/18/2022 4.7    RBC 06/18/2022 3.79 (L)  Hemoglobin 06/18/2022 10.6 (L)    HCT 06/18/2022 32.5 (L)    MCV 06/18/2022 85.7    MCHC 06/18/2022 32.7    RDW 06/18/2022 19.1 (H)    Platelets 06/18/2022 180.0    Neutrophils Relative % 06/18/2022 39.3 (L)    Lymphocytes Relative 06/18/2022 53.2 (H)    Monocytes Relative 06/18/2022 6.4    Eosinophils Relative 06/18/2022 0.8    Basophils Relative 06/18/2022 0.3    Neutro Abs 06/18/2022 1.8    Lymphs Abs 06/18/2022 2.5    Monocytes Absolute 06/18/2022 0.3    Eosinophils Absolute 06/18/2022 0.0    Basophils Absolute 06/18/2022 0.0    Sodium 06/18/2022 141    Potassium 06/18/2022 5.9 (H)    Chloride 06/18/2022 109    CO2 06/18/2022 27    Glucose, Bld 06/18/2022 117 (H)    BUN 06/18/2022 17    Creatinine, Ser 06/18/2022 1.23 (H)    GFR 06/18/2022 42.09 (L)    Calcium 06/18/2022 9.8   Office Visit on 06/11/2022  Component Date Value   Hgb A1c MFr Bld 06/11/2022 6.4    WBC 06/11/2022 4.1    RBC 06/11/2022 3.62 (L)    Platelets 06/11/2022 172.0    Hemoglobin 06/11/2022 10.1 (L)    HCT 06/11/2022 31.0 (L)    MCV 06/11/2022 85.5    MCHC 06/11/2022 32.6    RDW 06/11/2022 18.8 (H)    Sodium 06/11/2022 144    Potassium 06/11/2022 5.5 No hemolysis seen (H)    Chloride 06/11/2022 111    CO2 06/11/2022 27    Glucose, Bld 06/11/2022 100 (H)    BUN 06/11/2022 13    Creatinine, Ser 06/11/2022 1.01    Total Bilirubin 06/11/2022 0.3    Alkaline Phosphatase 06/11/2022 46    AST 06/11/2022 23    ALT 06/11/2022 20    Total Protein 06/11/2022 6.7    Albumin 06/11/2022 3.8    GFR 06/11/2022 53.32 (L)    Calcium 06/11/2022 9.5    Cholesterol 06/11/2022 186     Triglycerides 06/11/2022 94.0    HDL 06/11/2022 64.30    VLDL 06/11/2022 18.8    LDL Cholesterol 06/11/2022 103 (H)    Total CHOL/HDL Ratio 06/11/2022 3    NonHDL 06/11/2022 121.94    TSH 06/11/2022 0.25 (L)    Free T4 06/11/2022 0.80   Abstract on 06/09/2022  Component Date Value   [facility] Colonoscopy 06/08/2022 See Report (in chart)   There may be more visits with results that are not included.   No image results found.   MR THORACIC SPINE WO CONTRAST  Result Date: 02/23/2023 CLINICAL DATA:  Back pain for 8 months.  No known injury. EXAM: MRI THORACIC SPINE WITHOUT CONTRAST TECHNIQUE: Multiplanar, multisequence MR imaging of the thoracic spine was performed. No intravenous contrast was administered. COMPARISON:  None Available. FINDINGS: Alignment:  Physiologic. Vertebrae: No acute fracture, evidence of discitis, or aggressive bone lesion. Cord:  Normal signal and morphology. Paraspinal and other soft tissues: No acute paraspinal abnormality. Disc levels: Disc spaces:  Disc spaces are maintained. T1-T2: No disc protrusion, foraminal stenosis or central canal stenosis. T2-T3: No disc protrusion, foraminal stenosis or central canal stenosis. T3-T4: No disc protrusion, foraminal stenosis or central canal stenosis. T4-T5: No disc protrusion, foraminal stenosis or central canal stenosis. T5-T6: No disc protrusion, foraminal stenosis or central canal stenosis. T6-T7: No disc protrusion, foraminal stenosis or central canal stenosis. T7-T8: No disc protrusion, foraminal stenosis or central canal stenosis. T8-T9: No disc protrusion,  foraminal stenosis or central canal stenosis. T9-T10: No disc protrusion, foraminal stenosis or central canal stenosis. T10-T11: No disc protrusion, foraminal stenosis or central canal stenosis. Mild bilateral facet arthropathy. T11-T12: Minimal broad-based disc bulge eccentric towards the left. No foraminal or central canal stenosis. Mild bilateral facet arthropathy. IMPRESSION:  1. No acute osseous injury of the thoracic spine. 2. No significant thoracic spine disc protrusion, foraminal stenosis or central canal stenosis. Electronically Signed   By: Elige Ko M.D.   On: 02/23/2023 12:13       This encounter employed real-time, collaborative documentation. The patient actively reviewed and updated their medical record on a shared screen, ensuring transparency and facilitating joint problem-solving for the problem list, overview, and plan. This approach promotes accurate, informed care. The treatment plan was discussed and reviewed in detail, including medication safety, potential side effects, and all patient questions. We confirmed understanding and comfort with the plan. Follow-up instructions were established, including contacting the office for any concerns, returning if symptoms worsen, persist, or new symptoms develop, and precautions for potential emergency department visits. ----------------------------------------------------- Lula Olszewski, MD  05/06/2023 11:10 AM  [facility]:  [phone]
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
## 2023-05-11 ENCOUNTER — Encounter: Payer: Self-pay | Admitting: Internal Medicine

## 2023-05-11 ENCOUNTER — Ambulatory Visit (INDEPENDENT_AMBULATORY_CARE_PROVIDER_SITE_OTHER): Payer: HMO | Admitting: Internal Medicine

## 2023-05-11 VITALS — BP 96/66 | HR 69 | Temp 97.7°F | Ht 64.0 in | Wt 138.4 lb

## 2023-05-11 DIAGNOSIS — S3992XD Unspecified injury of lower back, subsequent encounter: Secondary | ICD-10-CM

## 2023-05-11 DIAGNOSIS — M25551 Pain in right hip: Secondary | ICD-10-CM
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
## 2023-05-11 NOTE — Assessment & Plan Note (Deleted)
Back Pain: Their pain level at rest is 4/10, increasing to 10/10 with activity. They have initiated acupuncture and are scheduled to start physical therapy. They will continue with both acupuncture and physical therapy, perform gentle stretches as advised by the acupuncturist, use a TENS unit, and consider lidocaine patches for pain management. We will check back in 2-3 weeks to assess progress with physical therapy. Planned for acupuncture- they wouldn't take  her insurance Back has improved maybe a little since visit, walking is ok She has been resting and gentle stretches as advised. Pain 4/10 at rest, 10 with exertion.   She brought cd from Dr. Delbert Harness 03/04/23 and 04/22/23 xrays spine. Just starting physical therapy Friday due to logistics delays , appointment availability. Plan is consider MRI lumbar spine if

## 2023-05-11 NOTE — Progress Notes (Signed)
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 collaborative documentation. The patient actively reviewed and updated their medical record on a shared screen, ensuring transparency and facilitating joint problem-solving for the problem list, overview, and plan. This approach promotes accurate, informed care. The treatment plan was discussed and reviewed in detail, including medication safety, potential side effects, and all patient questions. We confirmed understanding and comfort with the plan. Follow-up instructions were established, including contacting the office for any concerns, returning if symptoms worsen, persist, or new symptoms develop, and precautions for potential emergency department visits. ----------------------------------------------------- Lula Olszewski, MD  05/11/2023 10:35 AM  [facility]:  [phone]
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
## 2023-05-13 DIAGNOSIS — R252 Cramp and spasm: Secondary | ICD-10-CM | POA: Diagnosis not present

## 2023-05-13 DIAGNOSIS — S3992XD Unspecified injury of lower back, subsequent encounter: Secondary | ICD-10-CM | POA: Diagnosis not present

## 2023-05-16 DIAGNOSIS — S3992XD Unspecified injury of lower back, subsequent encounter: Secondary | ICD-10-CM | POA: Diagnosis not present

## 2023-05-16 DIAGNOSIS — R252 Cramp and spasm: Secondary | ICD-10-CM | POA: Diagnosis not present

## 2023-05-17 ENCOUNTER — Encounter: Payer: Self-pay | Admitting: Internal Medicine

## 2023-05-23 DIAGNOSIS — R252 Cramp and spasm: Secondary | ICD-10-CM | POA: Diagnosis not present

## 2023-05-23 DIAGNOSIS — S3992XD Unspecified injury of lower back, subsequent encounter: Secondary | ICD-10-CM | POA: Diagnosis not present

## 2023-05-25 ENCOUNTER — Other Ambulatory Visit: Payer: Self-pay | Admitting: Internal Medicine

## 2023-05-25 DIAGNOSIS — Z1231 Encounter for screening mammogram for malignant neoplasm of breast: Secondary | ICD-10-CM

## 2023-05-26 DIAGNOSIS — S3992XD Unspecified injury of lower back, subsequent encounter: Secondary | ICD-10-CM | POA: Diagnosis not present

## 2023-05-26 DIAGNOSIS — R252 Cramp and spasm: Secondary | ICD-10-CM | POA: Diagnosis not present

## 2023-05-30 DIAGNOSIS — S3992XD Unspecified injury of lower back, subsequent encounter: Secondary | ICD-10-CM | POA: Diagnosis not present

## 2023-05-30 DIAGNOSIS — R252 Cramp and spasm: Secondary | ICD-10-CM | POA: Diagnosis not present

## 2023-06-06 DIAGNOSIS — S3992XD Unspecified injury of lower back, subsequent encounter: Secondary | ICD-10-CM | POA: Diagnosis not present

## 2023-06-06 DIAGNOSIS — R252 Cramp and spasm: Secondary | ICD-10-CM | POA: Diagnosis not present

## 2023-06-08 ENCOUNTER — Ambulatory Visit: Payer: HMO | Admitting: Internal Medicine

## 2023-06-08 ENCOUNTER — Ambulatory Visit: Payer: HMO | Attending: Internal Medicine

## 2023-06-08 ENCOUNTER — Encounter: Payer: Self-pay | Admitting: Internal Medicine

## 2023-06-08 ENCOUNTER — Ambulatory Visit
Admission: RE | Admit: 2023-06-08 | Discharge: 2023-06-08 | Disposition: A | Discharge status: Home / Self Care | Payer: HMO | Source: Ambulatory Visit | Attending: Internal Medicine | Admitting: Internal Medicine

## 2023-06-08 VITALS — BP 112/73 | HR 68 | Temp 97.8°F | Ht 64.0 in | Wt 136.6 lb

## 2023-06-08 DIAGNOSIS — R55 Syncope and collapse: Secondary | ICD-10-CM

## 2023-06-08 DIAGNOSIS — M549 Dorsalgia, unspecified: Secondary | ICD-10-CM | POA: Diagnosis not present

## 2023-06-08 DIAGNOSIS — E039 Hypothyroidism, unspecified: Secondary | ICD-10-CM

## 2023-06-08 DIAGNOSIS — Z1231 Encounter for screening mammogram for malignant neoplasm of breast: Secondary | ICD-10-CM | POA: Diagnosis not present

## 2023-06-08 DIAGNOSIS — R296 Repeated falls: Secondary | ICD-10-CM | POA: Diagnosis not present

## 2023-06-08 DIAGNOSIS — N3000 Acute cystitis without hematuria: Secondary | ICD-10-CM | POA: Diagnosis not present

## 2023-06-08 DIAGNOSIS — R5383 Other fatigue: Secondary | ICD-10-CM | POA: Diagnosis not present

## 2023-06-08 DIAGNOSIS — D631 Anemia in chronic kidney disease: Secondary | ICD-10-CM

## 2023-06-08 DIAGNOSIS — G8929 Other chronic pain: Secondary | ICD-10-CM

## 2023-06-08 DIAGNOSIS — R7989 Other specified abnormal findings of blood chemistry: Secondary | ICD-10-CM | POA: Diagnosis not present

## 2023-06-08 DIAGNOSIS — W19XXXA Unspecified fall, initial encounter: Secondary | ICD-10-CM | POA: Diagnosis not present

## 2023-06-08 DIAGNOSIS — N1831 Chronic kidney disease, stage 3a: Secondary | ICD-10-CM

## 2023-06-08 DIAGNOSIS — S79911A Unspecified injury of right hip, initial encounter: Secondary | ICD-10-CM | POA: Diagnosis not present

## 2023-06-08 DIAGNOSIS — N390 Urinary tract infection, site not specified: Secondary | ICD-10-CM

## 2023-06-08 NOTE — Patient Instructions (Addendum)
VISIT SUMMARY:  During your recent visit, we discussed your history of back pain and recent falls. We also touched on your previous urinary tract infection (UTI) and current feelings of fatigue. We have outlined a plan to address these issues and ensure your overall health.  YOUR PLAN:  -RECURRENT FALLS: You've had two falls recently, one of which resulted in a bruised hip. We're not sure why these falls are happening, so we're going to do some tests. These include a hip X-ray to check for any fractures, and an EKG, which is a test that checks your heart's electrical activity. We're also referring you to a heart specialist to see if a heart condition could be causing your falls. We'll also review your medications to see if they could be contributing to your falls.  -POSSIBLE URINARY TRACT INFECTION: You had a UTI a few months ago and we didn't recheck it. Now you're feeling tired, which could be a sign of another UTI. We're going to do a urine test to check for this.  -BACK PAIN: Your back pain has improved with physical therapy and dry needling. We'll continue with this treatment plan.  INSTRUCTIONS:  We're ordering some tests for you, including a hip X-ray, an EKG, and a urine test. We're also referring you to a heart specialist. Please monitor your blood pressure at home and continue with your physical therapy for your back pain. We're also ordering some blood tests to check your overall health, including your B12, folate, and vitamin D levels.  Return to clinic: to be decided after review of labs and treatment(s) and response to treatment(s). But go to emergency room or return to clinic immediately if further collapse or near collapse.   Return in about 2 months (around 08/08/2023) for chronic disease monitoring and management.   It was a pleasure seeing you today! Your health and satisfaction are our top priorities.  Glenetta Hew, MD  Your Providers PCP: Lula Olszewski, MD,   [phone]) Referring Provider: Lula Olszewski, MD,  [phone]) Care Team Provider: Bernette Redbird, MD,  [phone]) Care Team Provider: Suzi Roots Care Team Provider: Domingo Mend,  [phone]) Care Team Provider: Holli Humbles, MD,  [phone]) Care Team Provider: Vladimir Faster, DO,  [phone]) Care Team Provider: Reece Packer, MD Care Team Provider: Eugenia Mcalpine, MD,  [phone]) Care Team Provider: Maeola Harman, MD,  [phone]) Care Team Provider: Dahlia Byes, [facility]     NEXT STEPS: [x]  Early Intervention: Schedule sooner appointment, call our on-call services, or go to emergency room if there is any significant Increase in pain or discomfort New or worsening symptoms Sudden or severe changes in your health [x]  Flexible Follow-Up: We recommend a Return in about 2 months (around 08/08/2023) for chronic disease monitoring and management. for optimal routine care. This allows for progress monitoring and treatment adjustments. [x]  Preventive Care: Schedule your annual preventive care visit! It's typically covered by insurance and helps identify potential health issues early. [x]  Lab & X-ray Appointments: Incomplete tests scheduled today, or call to schedule. X-rays: [facility] (M-F, 8:30am-noon or 1pm-5pm). [x]  Medical Information Release: Sign a release form at front desk to obtain relevant medical information we don't have.  MAKING THE MOST OF OUR FOCUSED 20 MINUTE APPOINTMENTS: [x]   Clearly state your top concerns at the beginning of the visit to focus our discussion [x]   If you anticipate you will need more time, please inform the front desk during scheduling - we can  book multiple appointments in the same week. [x]   If you have transportation problems- use our convenient video appointments or ask about transportation support. [x]   We can get down to business faster if you use MyChart to update information  before the visit and submit non-urgent questions before your visit. Thank you for taking the time to provide details through MyChart.  Let our nurse know and she can import this information into your encounter documents.  Arrival and Wait Times: [x]   Arriving on time ensures that everyone receives prompt attention. [x]   Early morning (8a) and afternoon (1p) appointments tend to have shortest wait times. [x]   Unfortunately, we cannot delay appointments for late arrivals or hold slots during phone calls.  Getting Answers and Following Up [x]   Simple Questions & Concerns: For quick questions or basic follow-up after your visit, reach [place] at [phone] or MyChart messaging. [x]   Complex Concerns: If your concern is more complex, scheduling an appointment might be best. Discuss this with the staff to find the most suitable option. [x]   Lab & Imaging Results: We'll contact you directly if results are abnormal or you don't use MyChart. Most normal results will be on MyChart within 2-3 business days, with a review message from Dr. Jon Billings. Haven't heard back in 2 weeks? Need results sooner? Contact us at [phone]. [x]   Referrals: Our referral coordinator will manage specialist referrals. The specialist's office should contact you within 2 weeks to schedule an appointment. Call us if you haven't heard from them after 2 weeks.  Staying Connected [x]   MyChart: Activate your MyChart for the fastest way to access results and message [place]. See the last page of this paperwork for instructions on how to activate.  Bring to Your Next Appointment [x]   Medications: Please bring all your medication bottles to your next appointment to ensure we have an accurate record of your prescriptions. [x]   Health Diaries: If you're monitoring any health conditions at home, keeping a diary of your readings can be very helpful for discussions at your next appointment.  Billing [x]   X-ray & Lab Orders: These are billed by  separate companies. Contact the invoicing company directly for questions or concerns. [x]   Visit Charges: Discuss any billing inquiries with our administrative services team.  Your Satisfaction Matters [x]   Share Your Experience: We strive for your satisfaction! If you have any complaints, or preferably compliments, please let Dr. Jon Billings know directly or contact our Practice Administrators, Edwena Felty or [employer], by asking at the front desk.   Reviewing Your Records [x]   Review this early draft of your clinical encounter notes below and the final encounter summary tomorrow on MyChart after its been completed.  All orders placed so far are visible here: Syncope and collapse -     DG HIP UNILAT W OR W/O PELVIS 2-3 VIEWS RIGHT; Future -     Urinalysis, Routine w reflex microscopic -     Comprehensive metabolic panel -     TSH -     CBC with Differential/Platelet -     B12 and Folate Panel -     VITAMIN D 25 Hydroxy (Vit-D Deficiency, Fractures) -     EKG 12-Lead -     Ambulatory referral to Cardiology -     Urine Culture -     LONG TERM MONITOR (3-14 DAYS); Future -     Hemoglobin A1c  Fall, initial encounter -     DG HIP UNILAT W OR  W/O PELVIS 2-3 VIEWS RIGHT; Future -     Urinalysis, Routine w reflex microscopic -     Comprehensive metabolic panel -     TSH -     CBC with Differential/Platelet -     B12 and Folate Panel -     VITAMIN D 25 Hydroxy (Vit-D Deficiency, Fractures) -     EKG 12-Lead -     Ambulatory referral to Cardiology -     Urine Culture  Hip injury, right, initial encounter  Chronic back pain greater than 3 months duration  Other fatigue

## 2023-06-08 NOTE — Progress Notes (Addendum)
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

## 2023-06-08 NOTE — Assessment & Plan Note (Signed)
There has been an improvement in their back pain with physical therapy and dry needling. The current physical therapy regimen will continue. We focused our efforts on her syncope instead today, although this had originally been the reason for visit at time of scheduling.

## 2023-06-08 NOTE — Progress Notes (Unsigned)
Enrolled for [employer] to mail a ZIO XT long term holter monitor to the patients address on file.   DOD to read. 

## 2023-06-09 ENCOUNTER — Other Ambulatory Visit: Payer: Self-pay

## 2023-06-09 ENCOUNTER — Encounter: Payer: Self-pay | Admitting: Internal Medicine

## 2023-06-09 DIAGNOSIS — L719 Rosacea, unspecified: Secondary | ICD-10-CM

## 2023-06-09 DIAGNOSIS — R252 Cramp and spasm: Secondary | ICD-10-CM | POA: Diagnosis not present

## 2023-06-09 DIAGNOSIS — S3992XD Unspecified injury of lower back, subsequent encounter: Secondary | ICD-10-CM | POA: Diagnosis not present

## 2023-06-09 DIAGNOSIS — N390 Urinary tract infection, site not specified: Secondary | ICD-10-CM | POA: Insufficient documentation

## 2023-06-09 LAB — CBC WITH DIFFERENTIAL/PLATELET
Basophils Absolute: 0 10*3/uL (ref 0.0–0.1)
Basophils Relative: 0.5 % (ref 0.0–3.0)
Eosinophils Absolute: 0 10*3/uL (ref 0.0–0.7)
Eosinophils Relative: 0.7 % (ref 0.0–5.0)
HCT: 38.7 % (ref 36.0–46.0)
Hemoglobin: 12.6 g/dL (ref 12.0–15.0)
Lymphocytes Relative: 39.3 % (ref 12.0–46.0)
Lymphs Abs: 2.3 10*3/uL (ref 0.7–4.0)
MCHC: 32.6 g/dL (ref 30.0–36.0)
MCV: 93.2 fl (ref 78.0–100.0)
Monocytes Absolute: 0.4 10*3/uL (ref 0.1–1.0)
Monocytes Relative: 6.2 % (ref 3.0–12.0)
Neutro Abs: 3.1 10*3/uL (ref 1.4–7.7)
Neutrophils Relative %: 53.3 % (ref 43.0–77.0)
Platelets: 229 10*3/uL (ref 150.0–400.0)
RBC: 4.16 Mil/uL (ref 3.87–5.11)
RDW: 16 % — ABNORMAL HIGH (ref 11.5–15.5)
WBC: 5.8 10*3/uL (ref 4.0–10.5)

## 2023-06-09 LAB — URINALYSIS, ROUTINE W REFLEX MICROSCOPIC
Bilirubin Urine: NEGATIVE
Hgb urine dipstick: NEGATIVE
Ketones, ur: NEGATIVE
Nitrite: NEGATIVE
RBC / HPF: NONE SEEN (ref 0–?)
Specific Gravity, Urine: 1.01 (ref 1.000–1.030)
Total Protein, Urine: NEGATIVE
Urine Glucose: >=1000 — AB
Urobilinogen, UA: 0.2 (ref 0.0–1.0)
pH: 6 (ref 5.0–8.0)

## 2023-06-09 LAB — COMPREHENSIVE METABOLIC PANEL
ALT: 19 U/L (ref 0–35)
AST: 23 U/L (ref 0–37)
Albumin: 4.2 g/dL (ref 3.5–5.2)
Alkaline Phosphatase: 92 U/L (ref 39–117)
BUN: 15 mg/dL (ref 6–23)
CO2: 24 meq/L (ref 19–32)
Calcium: 9.6 mg/dL (ref 8.4–10.5)
Chloride: 104 meq/L (ref 96–112)
Creatinine, Ser: 0.93 mg/dL (ref 0.40–1.20)
GFR: 58.46 mL/min — ABNORMAL LOW (ref >60.00)
Glucose, Bld: 68 mg/dL — ABNORMAL LOW (ref 70–99)
Potassium: 4.3 meq/L (ref 3.5–5.1)
Sodium: 136 meq/L (ref 135–145)
Total Bilirubin: 0.4 mg/dL (ref 0.2–1.2)
Total Protein: 7.2 g/dL (ref 6.0–8.3)

## 2023-06-09 LAB — B12 AND FOLATE PANEL
Folate: 4.3 ng/mL — ABNORMAL LOW (ref >5.9)
Vitamin B-12: 498 pg/mL (ref 211–911)

## 2023-06-09 LAB — TSH: TSH: 0.3 u[IU]/mL — ABNORMAL LOW (ref 0.35–5.50)

## 2023-06-09 LAB — VITAMIN D 25 HYDROXY (VIT D DEFICIENCY, FRACTURES): VITD: 40.59 ng/mL (ref 30.00–100.00)

## 2023-06-09 MED ORDER — CEPHALEXIN 500 MG PO CAPS
500.0000 mg | ORAL_CAPSULE | Freq: Two times a day (BID) | ORAL | 0 refills | Status: AC
Start: 2023-06-09 — End: 2023-06-16

## 2023-06-09 MED ORDER — METRONIDAZOLE 0.75 % EX GEL
1.0000 | Freq: Two times a day (BID) | CUTANEOUS | 3 refills | Status: DC
Start: 2023-06-09 — End: 2023-08-31

## 2023-06-09 NOTE — Progress Notes (Signed)
Patient presents with recurrent UTI, likely exacerbated by Jardiance (empagliflozin). Key findings:  UA: Cloudy urine, WBC 21-50/hpf, bacteria many, glucose >=1000 CMP: GFR 58.46 (mild decrease), glucose 68 (low) CBC: Mild anemia (Hgb 12.6, low folate 4.3) TSH: Low at 0.30 - I ordered reflex free T4 see if lab can process on existing blood.  Plan:  Prescribed Keflex for UTI Discontinue Jardiance Repeat UA after antibiotic course Cardiology referral for falls/syncope evaluation Emphasize hydration  Follow-up:  Ensure patient starts antibiotics immediately Educate on UTI symptoms and when to seek immediate care Discuss importance of medication change (stopping Jardiance) Remind about follow-up UA and cardiology appointment Address any concerns about diabetes management without Jardiance  Monitor for worsening symptoms or signs of urosepsis. Patient may need thyroid medication adjustment at next visit.

## 2023-06-09 NOTE — Addendum Note (Signed)
Addended by: Lula Olszewski on: 06/09/2023 08:18 PM   Modules accepted: Orders

## 2023-06-10 LAB — URINE CULTURE
MICRO NUMBER:: 15393933
SPECIMEN QUALITY:: ADEQUATE

## 2023-06-14 DIAGNOSIS — S3992XD Unspecified injury of lower back, subsequent encounter: Secondary | ICD-10-CM | POA: Diagnosis not present

## 2023-06-14 DIAGNOSIS — R252 Cramp and spasm: Secondary | ICD-10-CM | POA: Diagnosis not present

## 2023-06-15 DIAGNOSIS — R55 Syncope and collapse: Secondary | ICD-10-CM

## 2023-06-17 DIAGNOSIS — R252 Cramp and spasm: Secondary | ICD-10-CM | POA: Diagnosis not present

## 2023-06-17 DIAGNOSIS — S3992XD Unspecified injury of lower back, subsequent encounter: Secondary | ICD-10-CM | POA: Diagnosis not present

## 2023-06-24 DIAGNOSIS — R55 Syncope and collapse: Secondary | ICD-10-CM | POA: Diagnosis not present

## 2023-07-03 NOTE — Progress Notes (Signed)
I saw your results. There was nothing concerning discovered... essentially normal heart function.

## 2023-07-04 DIAGNOSIS — Z947 Corneal transplant status: Secondary | ICD-10-CM | POA: Diagnosis not present

## 2023-07-04 DIAGNOSIS — Z961 Presence of intraocular lens: Secondary | ICD-10-CM | POA: Diagnosis not present

## 2023-07-04 DIAGNOSIS — H43811 Vitreous degeneration, right eye: Secondary | ICD-10-CM | POA: Diagnosis not present

## 2023-07-05 ENCOUNTER — Telehealth: Payer: Self-pay | Admitting: Internal Medicine

## 2023-07-05 NOTE — Telephone Encounter (Signed)
Patient returned call. Requests to be called.

## 2023-07-05 NOTE — Telephone Encounter (Signed)
Called patient back concerning her heart monitor results.

## 2023-07-12 ENCOUNTER — Encounter: Payer: Self-pay | Admitting: Internal Medicine

## 2023-07-12 ENCOUNTER — Ambulatory Visit (INDEPENDENT_AMBULATORY_CARE_PROVIDER_SITE_OTHER): Payer: HMO | Admitting: Internal Medicine

## 2023-07-12 VITALS — BP 102/71 | HR 73 | Temp 97.7°F | Ht 64.0 in | Wt 130.4 lb

## 2023-07-12 DIAGNOSIS — Z029 Encounter for administrative examinations, unspecified: Secondary | ICD-10-CM

## 2023-07-12 DIAGNOSIS — N3000 Acute cystitis without hematuria: Secondary | ICD-10-CM

## 2023-07-12 DIAGNOSIS — R55 Syncope and collapse: Secondary | ICD-10-CM

## 2023-07-12 DIAGNOSIS — N183 Chronic kidney disease, stage 3 unspecified: Secondary | ICD-10-CM

## 2023-07-12 DIAGNOSIS — Z7985 Long-term (current) use of injectable non-insulin antidiabetic drugs: Secondary | ICD-10-CM | POA: Diagnosis not present

## 2023-07-12 DIAGNOSIS — E875 Hyperkalemia: Secondary | ICD-10-CM

## 2023-07-12 DIAGNOSIS — E611 Iron deficiency: Secondary | ICD-10-CM

## 2023-07-12 DIAGNOSIS — E1122 Type 2 diabetes mellitus with diabetic chronic kidney disease: Secondary | ICD-10-CM

## 2023-07-12 DIAGNOSIS — E039 Hypothyroidism, unspecified: Secondary | ICD-10-CM | POA: Diagnosis not present

## 2023-07-12 DIAGNOSIS — R7303 Prediabetes: Secondary | ICD-10-CM

## 2023-07-12 LAB — HEMOGLOBIN A1C: Hgb A1c MFr Bld: 5.2 % (ref 4.6–6.5)

## 2023-07-12 LAB — FERRITIN: Ferritin: 24.1 ng/mL (ref 10.0–291.0)

## 2023-07-12 MED ORDER — TIRZEPATIDE 7.5 MG/0.5ML ~~LOC~~ SOAJ
7.5000 mg | SUBCUTANEOUS | 11 refills | Status: DC
Start: 2023-07-12 — End: 2023-08-23
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
## 2023-07-12 NOTE — Assessment & Plan Note (Signed)
Her TSH levels have significantly dropped from 1.82 to 0.3 over the past 7 months while on thyroid medication. We will order TSH and T4 levels to assess current thyroid function and consider adjusting the thyroid medication dosage based on lab results.

## 2023-07-12 NOTE — Assessment & Plan Note (Signed)
She is on [facility]) 7.5mg  for weight loss. We will renew the prescription for semaglutide [facility]) 7.5mg .

## 2023-07-15 LAB — BASIC METABOLIC PANEL
BUN: 13 mg/dL (ref 7–25)
CO2: 26 mmol/L (ref 20–32)
Calcium: 10.1 mg/dL (ref 8.6–10.4)
Chloride: 107 mmol/L (ref 98–110)
Creat: 0.97 mg/dL (ref 0.60–1.00)
Glucose, Bld: 90 mg/dL (ref 65–99)
Potassium: 5.4 mmol/L — ABNORMAL HIGH (ref 3.5–5.3)
Sodium: 143 mmol/L (ref 135–146)

## 2023-07-15 LAB — URINALYSIS W MICROSCOPIC + REFLEX CULTURE
Bilirubin Urine: NEGATIVE
Hgb urine dipstick: NEGATIVE
Nitrites, Initial: POSITIVE — AB
Specific Gravity, Urine: 1.025 (ref 1.001–1.035)
pH: <=5 (ref 5.0–8.0)

## 2023-07-15 LAB — URINE CULTURE
MICRO NUMBER:: 15541487
SPECIMEN QUALITY:: ADEQUATE

## 2023-07-15 LAB — T4, FREE: Free T4: 1.4 ng/dL (ref 0.8–1.8)

## 2023-07-15 LAB — CULTURE INDICATED

## 2023-07-15 LAB — TSH+FREE T4: TSH W/REFLEX TO FT4: 0.12 m[IU]/L — ABNORMAL LOW (ref 0.40–4.50)

## 2023-07-16 ENCOUNTER — Other Ambulatory Visit: Payer: Self-pay | Admitting: Internal Medicine

## 2023-07-16 ENCOUNTER — Encounter: Payer: Self-pay | Admitting: Internal Medicine

## 2023-07-16 DIAGNOSIS — E1122 Type 2 diabetes mellitus with diabetic chronic kidney disease: Secondary | ICD-10-CM

## 2023-07-16 MED ORDER — KERENDIA 10 MG PO TABS: 10.0000 mg | ORAL_TABLET | Freq: Every day | ORAL | 2 refills | Status: DC

## 2023-07-16 MED ORDER — CIPROFLOXACIN HCL 500 MG PO TABS
500.0000 mg | ORAL_TABLET | Freq: Two times a day (BID) | ORAL | 0 refills | Status: AC
Start: 2023-07-16 — End: 2023-07-23

## 2023-07-16 MED ORDER — LEVOTHYROXINE SODIUM 88 MCG PO TABS: 88.0000 ug | ORAL_TABLET | Freq: Every day | ORAL | 3 refills | Status: DC

## 2023-07-16 NOTE — Addendum Note (Signed)
Addended by: Lula Olszewski on: 07/16/2023 02:16 PM   Modules accepted: Orders

## 2023-07-16 NOTE — Addendum Note (Signed)
Addended by: Lula Olszewski on: 07/16/2023 01:53 PM   Modules accepted: Orders

## 2023-07-29 ENCOUNTER — Other Ambulatory Visit (HOSPITAL_COMMUNITY): Payer: Self-pay

## 2023-08-04 ENCOUNTER — Other Ambulatory Visit: Payer: Self-pay | Admitting: Internal Medicine

## 2023-08-05 ENCOUNTER — Ambulatory Visit (INDEPENDENT_AMBULATORY_CARE_PROVIDER_SITE_OTHER): Payer: HMO | Admitting: Internal Medicine

## 2023-08-05 ENCOUNTER — Encounter: Payer: Self-pay | Admitting: Internal Medicine

## 2023-08-05 ENCOUNTER — Other Ambulatory Visit: Payer: Self-pay | Admitting: Internal Medicine

## 2023-08-05 ENCOUNTER — Other Ambulatory Visit: Payer: Self-pay

## 2023-08-05 VITALS — BP 96/70 | HR 65 | Temp 97.3°F | Ht 64.0 in | Wt 127.6 lb

## 2023-08-05 DIAGNOSIS — R9230 Dense breasts, unspecified: Secondary | ICD-10-CM | POA: Insufficient documentation

## 2023-08-05 DIAGNOSIS — K219 Gastro-esophageal reflux disease without esophagitis: Secondary | ICD-10-CM

## 2023-08-05 DIAGNOSIS — Z23 Encounter for immunization: Secondary | ICD-10-CM

## 2023-08-05 DIAGNOSIS — L918 Other hypertrophic disorders of the skin: Secondary | ICD-10-CM

## 2023-08-05 DIAGNOSIS — R682 Dry mouth, unspecified: Secondary | ICD-10-CM | POA: Diagnosis not present

## 2023-08-05 DIAGNOSIS — Z803 Family history of malignant neoplasm of breast: Secondary | ICD-10-CM | POA: Diagnosis not present

## 2023-08-05 DIAGNOSIS — L989 Disorder of the skin and subcutaneous tissue, unspecified: Secondary | ICD-10-CM | POA: Diagnosis not present

## 2023-08-05 DIAGNOSIS — K144 Atrophy of tongue papillae: Secondary | ICD-10-CM

## 2023-08-05 DIAGNOSIS — G250 Essential tremor: Secondary | ICD-10-CM

## 2023-08-05 DIAGNOSIS — N1832 Chronic kidney disease, stage 3b: Secondary | ICD-10-CM

## 2023-08-05 DIAGNOSIS — I7300 Raynaud's syndrome without gangrene: Secondary | ICD-10-CM

## 2023-08-05 DIAGNOSIS — R7989 Other specified abnormal findings of blood chemistry: Secondary | ICD-10-CM

## 2023-08-05 HISTORY — DX: Atrophy of tongue papillae: K14.4

## 2023-08-05 LAB — CBC WITH DIFFERENTIAL/PLATELET
Basophils Absolute: 0 10*3/uL (ref 0.0–0.1)
Basophils Relative: 0.4 % (ref 0.0–3.0)
Eosinophils Absolute: 0.1 10*3/uL (ref 0.0–0.7)
Eosinophils Relative: 1.7 % (ref 0.0–5.0)
HCT: 41.2 % (ref 36.0–46.0)
Hemoglobin: 13.2 g/dL (ref 12.0–15.0)
Lymphocytes Relative: 48.8 % — ABNORMAL HIGH (ref 12.0–46.0)
Lymphs Abs: 2.1 10*3/uL (ref 0.7–4.0)
MCHC: 31.9 g/dL (ref 30.0–36.0)
MCV: 96.1 fL (ref 78.0–100.0)
Monocytes Absolute: 0.3 10*3/uL (ref 0.1–1.0)
Monocytes Relative: 6 % (ref 3.0–12.0)
Neutro Abs: 1.9 10*3/uL (ref 1.4–7.7)
Neutrophils Relative %: 43.1 % (ref 43.0–77.0)
Platelets: 141 10*3/uL — ABNORMAL LOW (ref 150.0–400.0)
RBC: 4.29 Mil/uL (ref 3.87–5.11)
RDW: 15.2 % (ref 11.5–15.5)
WBC: 4.3 10*3/uL (ref 4.0–10.5)

## 2023-08-05 LAB — URINALYSIS, ROUTINE W REFLEX MICROSCOPIC
Bilirubin Urine: NEGATIVE
Hgb urine dipstick: NEGATIVE
Ketones, ur: NEGATIVE
Leukocytes,Ua: NEGATIVE
Nitrite: NEGATIVE
Specific Gravity, Urine: 1.015 (ref 1.000–1.030)
Total Protein, Urine: NEGATIVE
Urine Glucose: NEGATIVE
Urobilinogen, UA: 0.2 (ref 0.0–1.0)
pH: 6 (ref 5.0–8.0)

## 2023-08-05 LAB — COMPREHENSIVE METABOLIC PANEL
ALT: 15 U/L (ref 0–35)
AST: 18 U/L (ref 0–37)
Albumin: 4.5 g/dL (ref 3.5–5.2)
Alkaline Phosphatase: 106 U/L (ref 39–117)
BUN: 19 mg/dL (ref 6–23)
CO2: 27 meq/L (ref 19–32)
Calcium: 9.7 mg/dL (ref 8.4–10.5)
Chloride: 103 meq/L (ref 96–112)
Creatinine, Ser: 1.06 mg/dL (ref 0.40–1.20)
GFR: 49.91 mL/min — ABNORMAL LOW (ref >60.00)
Glucose, Bld: 78 mg/dL (ref 70–99)
Potassium: 4.6 meq/L (ref 3.5–5.1)
Sodium: 140 meq/L (ref 135–145)
Total Bilirubin: 0.3 mg/dL (ref 0.2–1.2)
Total Protein: 7.1 g/dL (ref 6.0–8.3)

## 2023-08-05 LAB — B12 AND FOLATE PANEL
Folate: 3.1 ng/mL — ABNORMAL LOW (ref >5.9)
Vitamin B-12: 413 pg/mL (ref 211–911)

## 2023-08-05 LAB — C-REACTIVE PROTEIN: CRP: <1 mg/dL (ref 0.5–20.0)

## 2023-08-05 LAB — SEDIMENTATION RATE: Sed Rate: 9 mm/h (ref 0–30)

## 2023-08-05 MED ORDER — NIFEDIPINE ER OSMOTIC RELEASE 30 MG PO TB24: 30.0000 mg | ORAL_TABLET | Freq: Every day | ORAL | 3 refills | Status: DC

## 2023-08-05 MED ORDER — XYLIMELTS 500 MG MT DISK: 1.0000 | DISK | Freq: Four times a day (QID) | OROMUCOSAL | 11 refills | Status: DC | PRN

## 2023-08-05 MED ORDER — CEVIMELINE HCL 30 MG PO CAPS
30.0000 mg | ORAL_CAPSULE | Freq: Three times a day (TID) | ORAL | 2 refills | Status: DC
Start: 2023-08-05 — End: 2023-08-31

## 2023-08-05 NOTE — Assessment & Plan Note (Signed)
They are at high risk for breast cancer due to dense breasts and a family history of the disease. We will order a bilateral breast MRI and advise regular follow-up for monitoring.

## 2023-08-05 NOTE — Progress Notes (Signed)
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    Chloride 08/17/2022 104    CO2 08/17/2022 26    Glucose, Bld 08/17/2022 117 (H)    BUN 08/17/2022 18    Creatinine, Ser 08/17/2022 0.88    Total Bilirubin 08/17/2022 0.2    Alkaline Phosphatase 08/17/2022 65    AST 08/17/2022 16    ALT 08/17/2022 13    Total Protein 08/17/2022 7.1    Albumin 08/17/2022 4.4    GFR 08/17/2022 62.83    Calcium 08/17/2022 9.5    VITD 08/17/2022 15.55 (L)    Uric Acid, Serum 08/17/2022 6.4    Phosphorus 08/17/2022 4.5    Microalb, Ur 08/17/2022 <0.7    Creatinine,U 08/17/2022 108.8    Microalb Creat Ratio 08/17/2022 0.6    Color, Urine 08/17/2022 YELLOW    APPearance 08/17/2022 CLEAR    Specific Gravity, Urine 08/17/2022 1.015    pH 08/17/2022 6.5    Total Protein, Urine  08/17/2022 NEGATIVE    Urine Glucose 08/17/2022 NEGATIVE    Ketones, ur 08/17/2022 NEGATIVE    Bilirubin Urine 08/17/2022 NEGATIVE    Hgb urine dipstick 08/17/2022 NEGATIVE    Urobilinogen, UA 08/17/2022 0.2    Leukocytes,Ua 08/17/2022 TRACE (A)    Nitrite 08/17/2022 NEGATIVE    WBC, UA 08/17/2022 3-6/hpf (A)    RBC / HPF 08/17/2022 none seen    Mucus, UA 08/17/2022 Presence of (A)    Squamous Epithelial / HPF 08/17/2022 Rare(0-4/hpf)    Hyaline Casts, UA 08/17/2022 Presence of (A)    No image results found.   LONG TERM MONITOR (3-14 DAYS)  Result Date: 07/01/2023   Patient had a minimum heart rate of 57 bpm, maximum heart rate of 123 bpm, and average heart rate of 75 bpm.   Predominant underlying rhythm was sinus rhythm.   Isolated PACs were rare (<1.0%).   Isolated PVCs were rare (<1.0%).   No triggered events. No malignant arrhythmias.  MM 3D SCREENING MAMMOGRAM BILATERAL BREAST  Result Date: 06/09/2023 CLINICAL DATA:  Screening. EXAM: DIGITAL SCREENING BILATERAL MAMMOGRAM WITH TOMOSYNTHESIS AND CAD TECHNIQUE: Bilateral screening digital craniocaudal and mediolateral oblique mammograms were obtained. Bilateral screening digital breast tomosynthesis was performed. The images were evaluated with computer-aided detection. COMPARISON:  Previous exam(s). ACR Breast Density Category c: The breasts are heterogeneously dense, which may obscure small masses. FINDINGS: There are no findings suspicious for malignancy. IMPRESSION: No mammographic evidence of malignancy. A result letter of this screening mammogram will be mailed directly to the patient. RECOMMENDATION: Screening mammogram in one year. (Code:SM-B-01Y) BI-RADS CATEGORY  1: Negative. Electronically Signed   By: Annia Belt M.D.   On: 06/09/2023 13:03    MM 3D SCREENING MAMMOGRAM BILATERAL BREAST  Result Date: 06/09/2023 CLINICAL DATA:  Screening. EXAM: DIGITAL SCREENING BILATERAL MAMMOGRAM WITH TOMOSYNTHESIS AND CAD TECHNIQUE: Bilateral  screening digital craniocaudal and mediolateral oblique mammograms were obtained. Bilateral screening digital breast tomosynthesis was performed. The images were evaluated with computer-aided detection. COMPARISON:  Previous exam(s). ACR Breast Density Category c: The breasts are heterogeneously dense, which may obscure small masses. FINDINGS: There are no findings suspicious for malignancy. IMPRESSION: No mammographic evidence of malignancy. A result letter of this screening mammogram will be mailed directly to the patient. RECOMMENDATION: Screening mammogram in one year. (Code:SM-B-01Y) BI-RADS CATEGORY  1: Negative. Electronically Signed   By: Francis Gaines.D.  On: 06/09/2023 13:03     Attestation:  I have personally spent  42 minutes involved in face-to-face and non-face-to-face activities for this patient on the day of the visit. Professional time spent includes the following activities:  Preparing to see the patient by reviewing medical records prior to and during the encounter; Obtaining, documenting, and reviewing an updated medical history; Performing a medically appropriate examination;  Evaluating, synthesizing, and documenting the available clinical information in the EMR;  Communicating, counseling, educating about results to the patientCollaboratively developing and communicating an individualized treatment plan with the patient; Placing medically necessary orders;   This time was independent of any separately billable procedure(s).  The extended duration of this patient visit was medically necessary due to several factors:  The patient's health condition is multifaceted, requiring a comprehensive evaluation of patient and their past records to ensure accurate diagnosis and treatment planning; Effective patient education and communication, particularly for patients with complex care needs, often require additional time to ensure the patient (or caregivers) fully understand the care plan;   Coordination of care with other healthcare professionals and services depends on thorough documentation, extending both documentation time and visit durations.  All these factors are integral to providing high-quality patient care and ensuring optimal health outcomes.   Additional Info: This encounter employed real-time, collaborative documentation. The patient actively reviewed and updated their medical record on a shared screen, ensuring transparency and facilitating joint problem-solving for the problem list, overview, and plan. This approach promotes accurate, informed care. The treatment plan was discussed and reviewed in detail, including medication safety, potential side effects, and all patient questions. We confirmed understanding and comfort with the plan. Follow-up instructions were established, including contacting the office for any concerns, returning if symptoms worsen, persist, or new symptoms develop, and precautions for potential emergency department visits.

## 2023-08-05 NOTE — Patient Instructions (Addendum)
It was a pleasure seeing you today! Your health and satisfaction are our top priorities.  Glenetta Hew, MD  Your Providers PCP: Lula Olszewski, MD,  [phone]) Referring Provider: Lula Olszewski, MD,  [phone]) Care Team Provider: Bernette Redbird, MD,  [phone]) Care Team Provider: Suzi Roots Care Team Provider: Domingo Mend,  [phone]) Care Team Provider: Holli Humbles, MD,  [phone]) Care Team Provider: Vladimir Faster, DO,  [phone]) Care Team Provider: Reece Packer, MD Care Team Provider: Eugenia Mcalpine, MD,  [phone]) Care Team Provider: Maeola Harman, MD,  [phone]) Care Team Provider: Dahlia Byes, [facility]  VISIT SUMMARY:  During today's visit, we addressed your concerns about persistent dry mouth and numb, cold fingers. We also discussed your skin lesion and your family history of breast cancer. We have developed a comprehensive plan to manage these issues and ensure your overall well-being.  YOUR PLAN:  -RAYNAUD'S PHENOMENON: Raynaud's Phenomenon is a condition where blood flow to your fingers is reduced, causing them to feel cold and numb. We will start you on Nifedipine to improve blood flow and recommend using electric hand warmers and keeping your hands warm.  -SJOGREN'S SYNDROME: Sjogren's Syndrome is an autoimmune disorder that causes dry mouth and other symptoms. We will order SSA and SSB tests to confirm the diagnosis and start you on Sevelamer to help moisturize your mouth. We also recommend using alcohol-free mouth rinses and Xylimelts for additional moisture. You will be referred to a rheumatologist for further management.  -SKIN LESION: You have a concerning wart that may have a small focus of cancer. We will refer you to a dermatologist for further evaluation and management.  -BREAST CANCER RISK: Due to your family history and dense breast tissue, you are at a higher risk for breast cancer. We will  order a bilateral breast MRI and advise regular follow-up for monitoring.  -GENERAL HEALTH MAINTENANCE: We plan to administer the flu vaccine in 2 weeks and will follow up in 2 weeks to review lab results and discuss further management.  INSTRUCTIONS:  Please follow up in 2 weeks for a review of your lab results and to discuss further management. Additionally, ensure you get your flu vaccine at that time.   NEXT STEPS: [x]  Early Intervention: Schedule sooner appointment, call our on-call services, or go to emergency room if there is any significant Increase in pain or discomfort New or worsening symptoms Sudden or severe changes in your health [x]  Flexible Follow-Up: We recommend a No follow-ups on file. for optimal routine care. This allows for progress monitoring and treatment adjustments. [x]  Preventive Care: Schedule your annual preventive care visit! It's typically covered by insurance and helps identify potential health issues early. [x]  Lab & X-ray Appointments: Incomplete tests scheduled today, or call to schedule. X-rays: [facility] (M-F, 8:30am-noon or 1pm-5pm). [x]  Medical Information Release: Sign a release form at front desk to obtain relevant medical information we don't have.  MAKING THE MOST OF OUR FOCUSED 20 MINUTE APPOINTMENTS: [x]   Clearly state your top concerns at the beginning of the visit to focus our discussion [x]   If you anticipate you will need more time, please inform the front desk during scheduling - we can book multiple appointments in the same week. [x]   If you have transportation problems- use our convenient video appointments or ask about transportation support. [x]   We can get down to business faster if you use MyChart to update information before the visit and submit non-urgent  questions before your visit. Thank you for taking the time to provide details through MyChart.  Let our nurse know and she can import this information into your  encounter documents.  Arrival and Wait Times: [x]   Arriving on time ensures that everyone receives prompt attention. [x]   Early morning (8a) and afternoon (1p) appointments tend to have shortest wait times. [x]   Unfortunately, we cannot delay appointments for late arrivals or hold slots during phone calls.  Getting Answers and Following Up [x]   Simple Questions & Concerns: For quick questions or basic follow-up after your visit, reach [place] at [phone] or MyChart messaging. [x]   Complex Concerns: If your concern is more complex, scheduling an appointment might be best. Discuss this with the staff to find the most suitable option. [x]   Lab & Imaging Results: We'll contact you directly if results are abnormal or you don't use MyChart. Most normal results will be on MyChart within 2-3 business days, with a review message from Dr. Jon Billings. Haven't heard back in 2 weeks? Need results sooner? Contact us at [phone]. [x]   Referrals: Our referral coordinator will manage specialist referrals. The specialist's office should contact you within 2 weeks to schedule an appointment. Call us if you haven't heard from them after 2 weeks.  Staying Connected [x]   MyChart: Activate your MyChart for the fastest way to access results and message [place]. See the last page of this paperwork for instructions on how to activate.  Bring to Your Next Appointment [x]   Medications: Please bring all your medication bottles to your next appointment to ensure we have an accurate record of your prescriptions. [x]   Health Diaries: If you're monitoring any health conditions at home, keeping a diary of your readings can be very helpful for discussions at your next appointment.  Billing [x]   X-ray & Lab Orders: These are billed by separate companies. Contact the invoicing company directly for questions or concerns. [x]   Visit Charges: Discuss any billing inquiries with our administrative services team.  Your Satisfaction  Matters [x]   Share Your Experience: We strive for your satisfaction! If you have any complaints, or preferably compliments, please let Dr. Jon Billings know directly or contact our Practice Administrators, Edwena Felty or [employer], by asking at the front desk.   Reviewing Your Records [x]   Review this early draft of your clinical encounter notes below and the final encounter summary tomorrow on MyChart after its been completed.  All orders placed so far are visible here: Raynaud's disease without gangrene -     MR BREAST BILATERAL W WO CONTRAST INC CAD; Future -     Urinalysis, Routine w reflex microscopic -     Comprehensive metabolic panel -     Sjogren's syndrome antibods(ssa + ssb) -     ANA w/Reflex -     Histone antibodies, IgG, blood -     CBC with Differential/Platelet -     Sedimentation rate -     C-reactive protein -     C3 complement -     C4 complement -     Cevimeline HCl; Take 1 capsule (30 mg total) by mouth 3 (three) times daily.  Dispense: 30 capsule; Refill: 2 -     Centromere Antibodies; Future -     ANA+ENA+DNA/DS+Scl 70+SjoSSA/B -     RNP Antibodies -     B12 and Folate Panel -     Iron, TIBC and Ferritin Panel -     TSH + free T4 -  XyliMelts; Use as directed 1 each in the mouth or throat 4 (four) times daily as needed.  Dispense: 80 each; Refill: 11 -     NIFEdipine ER Osmotic Release; Take 1 tablet (30 mg total) by mouth daily.  Dispense: 90 tablet; Refill: 3 -     Ambulatory referral to Rheumatology  Dry mouth -     MR BREAST BILATERAL W WO CONTRAST INC CAD; Future -     Urinalysis, Routine w reflex microscopic -     Comprehensive metabolic panel -     Sjogren's syndrome antibods(ssa + ssb) -     ANA w/Reflex -     Histone antibodies, IgG, blood -     CBC with Differential/Platelet -     Sedimentation rate -     C-reactive protein -     C3 complement -     C4 complement -     Cevimeline HCl; Take 1 capsule (30 mg total) by mouth 3 (three) times  daily.  Dispense: 30 capsule; Refill: 2 -     Centromere Antibodies; Future -     ANA+ENA+DNA/DS+Scl 70+SjoSSA/B -     RNP Antibodies -     B12 and Folate Panel -     Iron, TIBC and Ferritin Panel -     TSH + free T4 -     XyliMelts; Use as directed 1 each in the mouth or throat 4 (four) times daily as needed.  Dispense: 80 each; Refill: 11 -     NIFEdipine ER Osmotic Release; Take 1 tablet (30 mg total) by mouth daily.  Dispense: 90 tablet; Refill: 3 -     Ambulatory referral to Rheumatology  FH: breast cancer in first degree relative -     MR BREAST BILATERAL W WO CONTRAST INC CAD; Future -     Urinalysis, Routine w reflex microscopic -     Comprehensive metabolic panel -     Sjogren's syndrome antibods(ssa + ssb) -     ANA w/Reflex -     Histone antibodies, IgG, blood -     CBC with Differential/Platelet -     Sedimentation rate -     C-reactive protein -     C3 complement -     C4 complement -     Cevimeline HCl; Take 1 capsule (30 mg total) by mouth 3 (three) times daily.  Dispense: 30 capsule; Refill: 2 -     Centromere Antibodies; Future -     ANA+ENA+DNA/DS+Scl 70+SjoSSA/B -     RNP Antibodies -     B12 and Folate Panel -     Iron, TIBC and Ferritin Panel -     TSH + free T4 -     XyliMelts; Use as directed 1 each in the mouth or throat 4 (four) times daily as needed.  Dispense: 80 each; Refill: 11 -     NIFEdipine ER Osmotic Release; Take 1 tablet (30 mg total) by mouth daily.  Dispense: 90 tablet; Refill: 3  Need for influenza vaccination -     Ambulatory referral to Dermatology -     Flu Vaccine Trivalent High Dose (Fluad)  Skin tag -     Ambulatory referral to Dermatology  Dense breasts -     MR BREAST BILATERAL W WO CONTRAST INC CAD; Future -     Urinalysis, Routine w reflex microscopic -     Comprehensive metabolic panel -     Sjogren's syndrome antibods(ssa +  ssb) -     ANA w/Reflex -     Histone antibodies, IgG, blood -     CBC with  Differential/Platelet -     Sedimentation rate -     C-reactive protein -     C3 complement -     C4 complement -     Cevimeline HCl; Take 1 capsule (30 mg total) by mouth 3 (three) times daily.  Dispense: 30 capsule; Refill: 2 -     Centromere Antibodies; Future -     ANA+ENA+DNA/DS+Scl 70+SjoSSA/B -     RNP Antibodies -     B12 and Folate Panel -     Iron, TIBC and Ferritin Panel -     TSH + free T4 -     XyliMelts; Use as directed 1 each in the mouth or throat 4 (four) times daily as needed.  Dispense: 80 each; Refill: 11 -     NIFEdipine ER Osmotic Release; Take 1 tablet (30 mg total) by mouth daily.  Dispense: 90 tablet; Refill: 3  Skin lesion -     Ambulatory referral to Dermatology -     Ambulatory referral to Rheumatology  Atrophic glossitis         AFTER VISIT SUMMARY  Next Appointment: 2 weeks  WHAT WE TALKED ABOUT TODAY: You came in because: - Your fingers have been getting cold and numb, causing pain and discomfort - Your mouth and lips have been very dry for about 2 weeks - We examined your tongue and did a special test looking at the blood vessels in your fingernails  What we found: - Changes in your tongue's surface texture - Reduced blood vessels visible in your fingernail examination - These findings suggest your symptoms may be related to an autoimmune condition  WHAT WE'RE DOING:  1. New Medications:    * Nifedipine XL 30mg  - Take 1 pill daily for blood flow to fingers    * Cevimeline 30mg  - Take 1 pill three times a day for dry mouth      - Take 1 hour before or after other medications      - May cause sweating, upset stomach, or loose stools  2. Blood Tests We're Ordering:    * Several tests to check for autoimmune conditions    * Vitamin levels    * Thyroid function    * Blood count and kidney function  3. Referrals:    * Rheumatologist (specialist for autoimmune conditions)    * You'll be contacted for an appointment  WHAT YOU CAN DO  AT HOME:  For Cold Fingers: - Keep hands warm with gloves - Use hand warmers - Avoid cold temperatures when possible - Wear layers to keep your whole body warm  For Dry Mouth: - Sip water frequently - Use alcohol-free mouth rinse - Try XyliMelts tablets at night - Continue good dental care  WHEN TO CALL us: Contact the office if you have: - Severe finger pain or color changes lasting over 2 hours - Problems swallowing - Fever - New joint pain or swelling - Side effects from new medications - Worsening symptoms  YOUR NEXT STEPS: 1. Start new medications as prescribed 2. Complete blood tests within 1 week 3. Return to clinic in 2 weeks to check how you're doing 4. Watch for call from rheumatology for appointment 5. Keep track of any new symptoms or concerns  Questions? Please call: [Office Phone Number] After Hours: [After Hours Number]  NOTES: * Your blood pressure today was: [BP reading] * Bring all your medications to next visit * Write down any side effects or concerns to discuss  Remember: These symptoms are manageable with proper care and follow-up. We'll work together to find the best treatment plan for you.  Would you like me to add or clarify anything in this summary?

## 2023-08-05 NOTE — Assessment & Plan Note (Signed)
They present with a concerning wart on their skin, which may have a small focus of cancer. We will refer them to a dermatologist for further evaluation and management. Photographs Taken 08/05/2023

## 2023-08-05 NOTE — Assessment & Plan Note (Signed)
They exhibit cold, numb, and pale fingers with nail fold capillaroscopy showing active damage, likely secondary to an autoimmune condition. We will start Nifedipine to improve blood flow to their fingers and advise the use of electric hand warmers and maintaining warmth. Photographs Taken 08/05/2023 :

## 2023-08-06 LAB — CENTROMERE ANTIBODIES: Centromere Ab Screen: <1 AI

## 2023-08-07 ENCOUNTER — Encounter: Payer: Self-pay | Admitting: Internal Medicine

## 2023-08-07 DIAGNOSIS — E639 Nutritional deficiency, unspecified: Secondary | ICD-10-CM | POA: Insufficient documentation

## 2023-08-07 DIAGNOSIS — N1832 Chronic kidney disease, stage 3b: Secondary | ICD-10-CM | POA: Insufficient documentation

## 2023-08-07 NOTE — Addendum Note (Signed)
Addended by: Lula Olszewski on: 08/07/2023 03:31 PM   Modules accepted: Orders

## 2023-08-09 ENCOUNTER — Other Ambulatory Visit: Payer: Self-pay

## 2023-08-09 DIAGNOSIS — B379 Candidiasis, unspecified: Secondary | ICD-10-CM

## 2023-08-09 DIAGNOSIS — R7989 Other specified abnormal findings of blood chemistry: Secondary | ICD-10-CM

## 2023-08-09 DIAGNOSIS — R8271 Bacteriuria: Secondary | ICD-10-CM

## 2023-08-09 LAB — FANA STAINING PATTERNS: Speckled Pattern: 1:80 {titer}

## 2023-08-09 LAB — IRON,TIBC AND FERRITIN PANEL
%SAT: 53 % — ABNORMAL HIGH (ref 16–45)
Ferritin: 51 ng/mL (ref 16–288)
Iron: 135 ug/dL (ref 45–160)
TIBC: 255 ug/dL (ref 250–450)

## 2023-08-09 LAB — TSH+FREE T4: TSH W/REFLEX TO FT4: 0.18 m[IU]/L — ABNORMAL LOW (ref 0.40–4.50)

## 2023-08-09 LAB — C4 COMPLEMENT: C4 Complement: 16 mg/dL (ref 15–57)

## 2023-08-09 LAB — ANA W/REFLEX: ANA Titer 1: NEGATIVE

## 2023-08-09 LAB — ANA+ENA+DNA/DS+SCL 70+SJOSSA/B
ANA Titer 1: POSITIVE — AB
ENA RNP Ab: <0.2 AI (ref 0.0–0.9)
ENA SM Ab Ser-aCnc: <0.2 AI (ref 0.0–0.9)
ENA SSA (RO) Ab: <0.2 AI (ref 0.0–0.9)
ENA SSB (LA) Ab: <0.2 AI (ref 0.0–0.9)
Scleroderma (Scl-70) (ENA) Antibody, IgG: <0.2 AI (ref 0.0–0.9)
dsDNA Ab: 1 [IU]/mL (ref 0–9)

## 2023-08-09 LAB — T4, FREE: Free T4: 1.5 ng/dL (ref 0.8–1.8)

## 2023-08-09 LAB — C3 COMPLEMENT: C3 Complement: 134 mg/dL (ref 83–193)

## 2023-08-09 LAB — HISTONE ANTIBODIES, IGG, BLOOD: Histone Antibodies: <1 U (ref <1.0)

## 2023-08-09 LAB — SJOGREN'S SYNDROME ANTIBODS(SSA + SSB)
SSA (Ro) (ENA) Antibody, IgG: <1 AI
SSB (La) (ENA) Antibody, IgG: <1 AI

## 2023-08-09 NOTE — Telephone Encounter (Signed)
Patient's review of lab results/notes letter confirmed.

## 2023-08-09 NOTE — Telephone Encounter (Signed)
Ordered repeat TSH lab (in six weeks) and UA (monthly). Scheduled for follow-up visit on 11/8.

## 2023-08-19 ENCOUNTER — Encounter: Payer: Self-pay | Admitting: Internal Medicine

## 2023-08-19 ENCOUNTER — Emergency Department (HOSPITAL_COMMUNITY)
Admission: EM | Admit: 2023-08-19 | Discharge: 2023-08-19 | Disposition: A | Discharge status: Home / Self Care | Payer: HMO | Attending: Emergency Medicine | Admitting: Emergency Medicine

## 2023-08-19 ENCOUNTER — Other Ambulatory Visit: Payer: Self-pay

## 2023-08-19 ENCOUNTER — Ambulatory Visit (INDEPENDENT_AMBULATORY_CARE_PROVIDER_SITE_OTHER): Payer: HMO | Admitting: Internal Medicine

## 2023-08-19 ENCOUNTER — Telehealth: Payer: Self-pay | Admitting: Internal Medicine

## 2023-08-19 VITALS — BP 133/78 | HR 100 | Temp 97.5°F | Resp 16 | Ht 64.0 in | Wt 120.4 lb

## 2023-08-19 DIAGNOSIS — R112 Nausea with vomiting, unspecified: Secondary | ICD-10-CM | POA: Insufficient documentation

## 2023-08-19 DIAGNOSIS — K922 Gastrointestinal hemorrhage, unspecified: Secondary | ICD-10-CM | POA: Diagnosis not present

## 2023-08-19 DIAGNOSIS — N1832 Chronic kidney disease, stage 3b: Secondary | ICD-10-CM | POA: Diagnosis not present

## 2023-08-19 DIAGNOSIS — E860 Dehydration: Secondary | ICD-10-CM | POA: Insufficient documentation

## 2023-08-19 DIAGNOSIS — I7300 Raynaud's syndrome without gangrene: Secondary | ICD-10-CM

## 2023-08-19 DIAGNOSIS — K920 Hematemesis: Secondary | ICD-10-CM | POA: Diagnosis not present

## 2023-08-19 DIAGNOSIS — M359 Systemic involvement of connective tissue, unspecified: Secondary | ICD-10-CM | POA: Diagnosis not present

## 2023-08-19 DIAGNOSIS — R197 Diarrhea, unspecified: Secondary | ICD-10-CM | POA: Insufficient documentation

## 2023-08-19 DIAGNOSIS — Z7982 Long term (current) use of aspirin: Secondary | ICD-10-CM | POA: Diagnosis not present

## 2023-08-19 LAB — CBC
HCT: 37.4 % (ref 36.0–46.0)
Hemoglobin: 12.7 g/dL (ref 12.0–15.0)
MCH: 30.9 pg (ref 26.0–34.0)
MCHC: 34 g/dL (ref 30.0–36.0)
MCV: 91 fL (ref 80.0–100.0)
Platelets: 197 10*3/uL (ref 150–400)
RBC: 4.11 MIL/uL (ref 3.87–5.11)
RDW: 13.8 % (ref 11.5–15.5)
WBC: 6.7 10*3/uL (ref 4.0–10.5)
nRBC: 0 % (ref 0.0–0.2)

## 2023-08-19 LAB — URINALYSIS, ROUTINE W REFLEX MICROSCOPIC
Bilirubin Urine: NEGATIVE
Glucose, UA: NEGATIVE mg/dL
Hgb urine dipstick: NEGATIVE
Ketones, ur: 80 mg/dL — AB
Nitrite: NEGATIVE
Protein, ur: 100 mg/dL — AB
Specific Gravity, Urine: 1.02 (ref 1.005–1.030)
pH: 5 (ref 5.0–8.0)

## 2023-08-19 LAB — COMPREHENSIVE METABOLIC PANEL
ALT: 18 U/L (ref 0–44)
AST: 20 U/L (ref 15–41)
Albumin: 4.1 g/dL (ref 3.5–5.0)
Alkaline Phosphatase: 94 U/L (ref 38–126)
Anion gap: 18 — ABNORMAL HIGH (ref 5–15)
BUN: 10 mg/dL (ref 8–23)
CO2: 17 mmol/L — ABNORMAL LOW (ref 22–32)
Calcium: 10 mg/dL (ref 8.9–10.3)
Chloride: 108 mmol/L (ref 98–111)
Creatinine, Ser: 1 mg/dL (ref 0.44–1.00)
GFR, Estimated: 57 mL/min — ABNORMAL LOW (ref >60)
Glucose, Bld: 88 mg/dL (ref 70–99)
Potassium: 3.8 mmol/L (ref 3.5–5.1)
Sodium: 143 mmol/L (ref 135–145)
Total Bilirubin: 1.4 mg/dL — ABNORMAL HIGH (ref <1.2)
Total Protein: 7.2 g/dL (ref 6.5–8.1)

## 2023-08-19 LAB — LIPASE, BLOOD: Lipase: 40 U/L (ref 11–51)

## 2023-08-19 MED ORDER — ONDANSETRON 4 MG PO TBDP: 4.0000 mg | ORAL_TABLET | Freq: Three times a day (TID) | ORAL | 0 refills | Status: DC | PRN

## 2023-08-19 MED ORDER — SODIUM CHLORIDE 0.9 % IV BOLUS
1000.0000 mL | Freq: Once | INTRAVENOUS | Status: AC
  Administered 2023-08-19: 1000 mL via INTRAVENOUS

## 2023-08-19 NOTE — ED Provider Notes (Signed)
[place] EMERGENCY DEPARTMENT AT [facility] Provider Note   CSN: [id number] Arrival date & time: 08/19/23  6301     History  Chief Complaint  Patient presents with   Nausea   Emesis   Diarrhea    Laura Farrell is a 77 y.o. female.   Emesis Associated symptoms: diarrhea   Diarrhea Associated symptoms: vomiting    This patient is a very pleasant 77 year old female, she has a history of high cholesterol, she is on iron supplementation, she has hypothyroidism, she is followed very closely by her family doctor who sent her to the hospital today for evaluation and concern for dehydration.  Evidently the patient had been to a cookout of some kind about a week and a half ago after which time she developed black diarrhea, this changed to watery diarrhea,, and then totally resolved.  The diarrhea came back, she had a couple of days of vomiting, and over the last 48 hours all of that is resolved but she is left with a iron or metallic taste in her mouth.  She saw her family doctor today who recommended that she come to the hospital for evaluation, evaluation of kidney function, hemoglobin and IV fluids at the minimum.  The patient states she does not want to be admitted, she does not want to have any interventions if she can avoid it but is agreeable to have IV fluids.  She states that she is not nauseated, she was trying to lose weight for the month prior to this occurring and was significantly and severely calorie restricting prior to having the diarrhea.    Home Medications Prior to Admission medications   Medication Sig Start Date End Date Taking? Authorizing Provider  ondansetron (ZOFRAN-ODT) 4 MG disintegrating tablet Take 1 tablet (4 mg total) by mouth every 8 (eight) hours as needed for nausea. 08/19/23  Yes Eber Hong, MD  aspirin 81 MG tablet Take 81 mg by mouth daily. Patient not taking: Reported on 08/19/2023    [provider]  atorvastatin (LIPITOR) 80 MG  tablet Take 1 tablet (80 mg total) by mouth daily. Patient not taking: Reported on 08/19/2023 03/11/22   Willow Ora, MD  buPROPion [facility] SR) 100 MG 12 hr tablet Take 1 tablet (100 mg total) by mouth in the morning. Patient not taking: Reported on 08/19/2023 09/28/22   Lula Olszewski, MD  cevimeline [facility]) 30 MG capsule Take 1 capsule (30 mg total) by mouth 3 (three) times daily. Patient not taking: Reported on 08/19/2023 08/05/23   Lula Olszewski, MD  CREAM BASE EX Apply topically 2 (two) times daily. Patient not taking: Reported on 08/19/2023    [provider]  cyclobenzaprine (FLEXERIL) 10 MG tablet TAKE ONE TABLET BY MOUTH THREE TIMES DAILY AS NEEDED FOR MUSCLE SPASMS Patient not taking: Reported on 08/19/2023 05/06/23   Lula Olszewski, MD  diclofenac Sodium (VOLTAREN) 1 % GEL Apply 2 g topically 4 (four) times daily. Patient not taking: Reported on 08/19/2023 03/14/23   Lula Olszewski, MD  estradiol (ESTRACE) 0.1 MG/GM vaginal cream Place a fingertip amount of cream in the vagina nightly x 2 weeks, then use every other night Patient not taking: Reported on 08/19/2023 12/27/22   [provider]  famotidine (PEPCID) 20 MG tablet Take 20 mg by mouth 2 (two) times daily. Patient not taking: Reported on 08/19/2023 02/02/23   [provider]  ferrous sulfate 325 (65 FE) MG tablet TAKE 1 TABLET  BY MOUTH DAILY Patient not taking: Reported on 08/19/2023 01/06/23   Lula Olszewski, MD  Finerenone (KERENDIA) 10 MG TABS Take 1 tablet (10 mg total) by mouth daily. DO NOT START UNTIL POTASSIUM UNDER 5 Patient not taking: Reported on 08/19/2023 07/16/23   Lula Olszewski, MD  gabapentin (NEURONTIN) 600 MG tablet Take 1 tablet (600 mg total) by mouth 2 (two) times daily. Patient not taking: Reported on 08/19/2023 02/23/23   Lula Olszewski, MD  levothyroxine (SYNTHROID) 100 MCG tablet TAKE 1 TABLET BY MOUTH DAILY Patient not taking: Reported on 08/19/2023 08/04/23    Lula Olszewski, MD  levothyroxine (SYNTHROID) 88 MCG tablet Take 1 tablet (88 mcg total) by mouth daily. Patient not taking: Reported on 08/19/2023 07/16/23   Lula Olszewski, MD  metFORMIN (GLUCOPHAGE) 500 MG tablet Take 1 tablet (500 mg total) by mouth 2 (two) times daily with a meal. Patient not taking: Reported on 08/19/2023 09/28/22   Lula Olszewski, MD  metroNIDAZOLE (METROGEL) 0.75 % gel Apply 1 Application topically 2 (two) times daily. Patient not taking: Reported on 08/19/2023 06/09/23   Lula Olszewski, MD  NIFEdipine (PROCARDIA-XL/NIFEDICAL-XL) 30 MG 24 hr tablet Take 1 tablet (30 mg total) by mouth daily. Patient not taking: Reported on 08/19/2023 08/05/23   Lula Olszewski, MD  omeprazole (PRILOSEC) 20 MG capsule TAKE 1 CAPSULE BY MOUTH DAILY TO ELIMINATE HEARTBURN ALL DAY Patient not taking: Reported on 08/19/2023 08/06/23   Lula Olszewski, MD  prednisoLONE acetate (PRED FORTE) 1 % ophthalmic suspension PLACE 1 DROP INTO LEFT EYE DAILY Patient not taking: Reported on 08/19/2023 08/03/18   [provider]  propranolol (INDERAL) 10 MG tablet Take 1 tablet (10 mg total) by mouth 2 (two) times daily. Patient not taking: Reported on 08/19/2023 02/24/23   Tat, Octaviano Batty, DO  solifenacin (VESICARE) 10 MG tablet Take 1 tablet (10 mg total) by mouth daily. Patient not taking: Reported on 08/19/2023 09/28/22   Lula Olszewski, MD  solifenacin (VESICARE) 5 MG tablet TAKE 1 TABLET BY MOUTH DAILY Patient not taking: Reported on 08/19/2023 05/07/23   Lula Olszewski, MD  tirzepatide [facility]) 7.5 MG/0.5ML Pen Inject 7.5 mg into the skin once a week. Replaces 5 mg dose Patient not taking: Reported on 08/19/2023 07/12/23   Lula Olszewski, MD  traMADol (ULTRAM) 50 MG tablet Take 1 tablet (50 mg total) by mouth every 6 (six) hours as needed. Patient not taking: Reported on 08/19/2023 06/11/22   Lula Olszewski, MD  triamcinolone cream (KENALOG) 0.1 %     [provider]  trospium  (SANCTURA) 20 MG tablet Take 20 mg by mouth 2 (two) times daily. Patient not taking: Reported on 08/19/2023 06/17/23   [provider]  venlafaxine XR (EFFEXOR XR) 75 MG 24 hr capsule Take 1 capsule (75 mg total) by mouth daily with breakfast. Patient not taking: Reported on 08/19/2023 03/14/23   Lula Olszewski, MD  Xylitol (XYLIMELTS) 500 MG DISK Use as directed 1 each in the mouth or throat 4 (four) times daily as needed. Patient not taking: Reported on 08/19/2023 08/05/23   Lula Olszewski, MD      Allergies    Other    Review of Systems   Review of Systems  Gastrointestinal:  Positive for diarrhea and vomiting.  All other systems reviewed and are negative.   Physical Exam Updated Vital Signs BP 139/84 (BP Location: Right Arm)   Pulse (!) 104  Temp 98 F (36.7 C) (Oral)   Resp 19   SpO2 100%  Physical Exam Vitals and nursing note reviewed.  Constitutional:      General: She is not in acute distress.    Appearance: She is well-developed.  HENT:     Head: Normocephalic and atraumatic.     Mouth/Throat:     Pharynx: No oropharyngeal exudate.  Eyes:     General: No scleral icterus.       Right eye: No discharge.        Left eye: No discharge.     Conjunctiva/sclera: Conjunctivae normal.     Pupils: Pupils are equal, round, and reactive to light.  Neck:     Thyroid: No thyromegaly.     Vascular: No JVD.  Cardiovascular:     Rate and Rhythm: Normal rate and regular rhythm.     Heart sounds: Normal heart sounds. No murmur heard.    No friction rub. No gallop.  Pulmonary:     Effort: Pulmonary effort is normal. No respiratory distress.     Breath sounds: Normal breath sounds. No wheezing or rales.  Abdominal:     General: Bowel sounds are normal. There is no distension.     Palpations: Abdomen is soft. There is no mass.     Tenderness: There is no abdominal tenderness.  Musculoskeletal:        General: No tenderness. Normal range of motion.     Cervical back:  Normal range of motion and neck supple.     Right lower leg: No edema.     Left lower leg: No edema.  Lymphadenopathy:     Cervical: No cervical adenopathy.  Skin:    General: Skin is warm and dry.     Findings: No erythema or rash.  Neurological:     Mental Status: She is alert.     Coordination: Coordination normal.  Psychiatric:        Behavior: Behavior normal.     ED Results / Procedures / Treatments   Labs (all labs ordered are listed, but only abnormal results are displayed) Labs Reviewed  COMPREHENSIVE METABOLIC PANEL - Abnormal; Notable for the following components:      Result Value   CO2 17 (*)    Total Bilirubin 1.4 (*)    GFR, Estimated 57 (*)    Anion gap 18 (*)    All other components within normal limits  URINALYSIS, ROUTINE W REFLEX MICROSCOPIC - Abnormal; Notable for the following components:   APPearance HAZY (*)    Ketones, ur 80 (*)    Protein, ur 100 (*)    Leukocytes,Ua TRACE (*)    Bacteria, UA RARE (*)    All other components within normal limits  LIPASE, BLOOD  CBC    EKG None  Radiology No results found.  Procedures Procedures    Medications Ordered in ED Medications  sodium chloride 0.9 % bolus 1,000 mL (0 mLs Intravenous Stopped 08/19/23 1419)    ED Course/ Medical Decision Making/ A&P                                 Medical Decision Making Amount and/or Complexity of Data Reviewed Labs: ordered.  Risk Prescription drug management.    This patient presents to the ED for concern of nausea vomiting and diarrhea all of which is resolved, decreased appetite, generalized weakness, this involves an extensive number of  treatment options, and is a complaint that carries with it a high risk of complications and morbidity.  The differential diagnosis includes patient, renal failure, electrolyte abnormalities, unlikely to be infection, could have been food poisoning, all of those symptoms have resolved at this point, left with some  fatigue and malaise   Co morbidities that complicate the patient evaluation  Multiple prior comorbidities as outlined in the HPI, she is a non-smoker, nondrinker   Additional history obtained:  Additional history obtained from medical record External records from outside source obtained and reviewed including office notes from primary care visit today   Lab Tests:  I Ordered, and personally interpreted labs.  The pertinent results include: CBC and metabolic panel without acute findings other than a slightly low CO2 consistent with a slight acidosis also consistent with dehydration, urinalysis with ketones  I do not think imaging is necessary  Cardiac Monitoring: / EKG:  The patient was maintained on a cardiac monitor.  I personally viewed and interpreted the cardiac monitored which showed an underlying rhythm of: Normal sinus rhythm, heart rate of 95 on my exam   Problem List / ED Course / Critical interventions / Medication management  The patient declines aggressive intervention such as colonoscopy or endoscopy, she has had both of these in the past and has never had any significant abnormal findings.  She has no history of stomach ulcers, she has no abdominal pain, she is no longer vomiting or having diarrhea and for 2 days she has just had a decreased appetite.  With a normal hemoglobin, normal renal function and signs of ketonuria she will get some IV fluids, she elects to go home which I think is very reasonable, I will try to communicate with primary care physician I ordered medication including IVF  for dehydration  Reevaluation of the patient after these medicines showed that the patient improved I have reviewed the patients home medicines and have made adjustments as needed Improved significantly after IV fluids, states she is ready for discharge   Social Determinants of Health:  none   Test / Admission - Considered:  Considered admission - but pt declines - seems  reasonable.        Final Clinical Impression(s) / ED Diagnoses Final diagnoses:  Dehydration    Rx / DC Orders ED Discharge Orders          Ordered    ondansetron (ZOFRAN-ODT) 4 MG disintegrating tablet  Every 8 hours PRN        08/19/23 1440              Eber Hong, MD 08/19/23 1441

## 2023-08-19 NOTE — Assessment & Plan Note (Signed)
She has pre-existing kidney disease, with recent blood work showing decreased kidney function and has been off her regular medications due to recent illness. The risks of untreated kidney disease include progression to renal failure, making urgent evaluation crucial for timely adjustment of medications and prevention of further kidney damage. We will evaluate her kidney function urgently in the ER and adjust medications based on the current kidney function.

## 2023-08-19 NOTE — ED Notes (Signed)
Patient stated that she felt like she was going to pass out [employer] notified

## 2023-08-19 NOTE — Patient Instructions (Signed)
VISIT SUMMARY:  During your visit, we discussed your recent gastrointestinal symptoms, including severe diarrhea, nausea, and vomiting, which raised concerns about possible gastrointestinal bleeding. We also addressed your dehydration, chronic kidney disease, and potential autoimmune disease. Additionally, we reviewed your medication management and planned for further evaluation and treatment.  YOUR PLAN:  -GASTROINTESTINAL BLEEDING: Gastrointestinal bleeding refers to any bleeding that starts in the gastrointestinal tract. Your symptoms, including black diarrhea and vomit that looks like coffee grounds, suggest possible upper GI bleeding. We are sending you to the ER for urgent evaluation and treatment, including blood work, IV fluids, and possibly a colonoscopy or endoscopy to find the source of the bleeding. Testing for C. difficile infection is also planned.  -DEHYDRATION: Dehydration occurs when your body loses more fluids than it takes in. Your symptoms, such as dry mouth and chapped lips, indicate severe dehydration. In the ER, you will receive IV fluids to quickly rehydrate and stabilize your electrolyte levels.  -CHRONIC KIDNEY DISEASE: Chronic kidney disease means your kidneys are not working as well as they should. Recent blood work showed decreased kidney function. We will evaluate your kidney function urgently in the ER and adjust your medications to prevent further kidney damage.  -AUTOIMMUNE DISEASE (POSSIBLE SCLERODERMA OR CREST SYNDROME): Autoimmune diseases occur when the body's immune system attacks its own tissues. You have symptoms like Raynaud's syndrome and a positive ANA titer, which may suggest conditions like scleroderma or CREST syndrome. We will consider further evaluation by a rheumatologist if your symptoms persist.  -MEDICATION MANAGEMENT: You stopped taking your regular medications due to recent illness and potential drug interactions. We will review and adjust your  medication regimen based on your current health status and kidney function, planning to restart necessary medications after your ER evaluation.  INSTRUCTIONS:  Please go to the ER immediately for urgent evaluation and treatment. We will schedule a follow-up appointment next week to monitor your progress and adjust your treatment plan accordingly.

## 2023-08-19 NOTE — ED Triage Notes (Signed)
Pt. Stated, Ive had diarrhea for almost 3 weeks and N/V for the last 3 days. Im not sleeping at all. My DR. Sent me here for fluids and a colonscopy and an Endoscopy . Ive also had a metal taste in my mouth.

## 2023-08-19 NOTE — Telephone Encounter (Signed)
Patient states she is going to [facility] ED now (8:47 am 08/19/23)

## 2023-08-19 NOTE — Discharge Instructions (Addendum)
Please make sure that you are drinking of clear fluids, make sure that you are trying to eat some healthy foods, you need to follow-up with your doctor this week, they probably need to have you follow-up for an endoscopy but this does not need to be done emergently unless you are having worsening vomiting black vomit, black stool, blood in your stools or severe abdominal pain.  If that occurs come to the ER immediately.  Zofran is a medication which can help with nausea.  You may take 4 mg by mouth every 6 hours as needed if you are an adult, if your child under the age of 6 take half of a tablet or 2 mg every 6 hours as needed.  This should dissolve on your tongue within a short timeframe.  Wait about 30 minutes after taking it to help with drinking clear liquids.

## 2023-08-19 NOTE — ED Notes (Signed)
Patient was place in a hospital chair and lied back. Patient stated that she felt better

## 2023-08-19 NOTE — Progress Notes (Signed)
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 03/16/2023 MODERATE (A)    Nitrite 03/16/2023 NEGATIVE    WBC, UA 03/16/2023 21-50/hpf (A)    RBC / HPF 03/16/2023 0-2/hpf    Squamous Epithelial / HPF 03/16/2023 Rare(0-4/hpf)    Bacteria, UA 03/16/2023 Many(>50/hpf) (A)   Office Visit on 12/14/2022  Component Date Value   Color, Urine 12/14/2022 YELLOW    APPearance 12/14/2022 Cloudy (A)    Specific Gravity, Urine 12/14/2022 1.020    pH 12/14/2022 6.0    Total Protein, Urine 12/14/2022 NEGATIVE    Urine Glucose 12/14/2022 >=1000 (A)    Ketones, ur 12/14/2022 NEGATIVE    Bilirubin Urine 12/14/2022 NEGATIVE    Hgb urine dipstick 12/14/2022 NEGATIVE    Urobilinogen, UA 12/14/2022 0.2    Leukocytes,Ua 12/14/2022 SMALL (A)    Nitrite 12/14/2022 POSITIVE (A)    WBC, UA 12/14/2022 TNTC(>50/hpf) (A)    RBC / HPF 12/14/2022 0-2/hpf    Squamous Epithelial / HPF 12/14/2022 Rare(0-4/hpf)    Bacteria, UA 12/14/2022 Many(>50/hpf) (A)    WBC 12/14/2022 5.6    RBC  12/14/2022 4.69    Platelets 12/14/2022 183.0    Hemoglobin 12/14/2022 13.7    HCT 12/14/2022 41.4    MCV 12/14/2022 88.2    MCHC 12/14/2022 33.0    RDW 12/14/2022 17.7 (H)    Sodium 12/14/2022 141    Potassium 12/14/2022 4.1    Chloride 12/14/2022 105    CO2 12/14/2022 25    Glucose, Bld 12/14/2022 80    BUN 12/14/2022 16    Creatinine, Ser 12/14/2022 1.00    Total Bilirubin 12/14/2022 0.2    Alkaline Phosphatase 12/14/2022 81    AST 12/14/2022 26    ALT 12/14/2022 26    Total Protein 12/14/2022 7.2    Albumin 12/14/2022 4.4    GFR 12/14/2022 53.77 (L)    Calcium 12/14/2022 9.9    Hgb A1c MFr Bld 12/14/2022 6.2    Cholesterol 12/14/2022 154    Triglycerides 12/14/2022 105.0    HDL 12/14/2022 46.20    VLDL 12/14/2022 21.0    LDL Cholesterol 12/14/2022 86    Total CHOL/HDL Ratio 12/14/2022 3    NonHDL 12/14/2022 107.40    TSH 12/14/2022 1.82    No image results found.   LONG TERM MONITOR (3-14 DAYS)  Result Date: 07/01/2023   Patient had a minimum heart rate of 57 bpm, maximum heart rate of 123 bpm, and average heart rate of 75 bpm.   Predominant underlying rhythm was sinus rhythm.   Isolated PACs were rare (<1.0%).   Isolated PVCs were rare (<1.0%).   No triggered events. No malignant arrhythmias.  MM 3D SCREENING MAMMOGRAM BILATERAL BREAST  Result Date: 06/09/2023 CLINICAL DATA:  Screening. EXAM: DIGITAL SCREENING BILATERAL MAMMOGRAM WITH TOMOSYNTHESIS AND CAD TECHNIQUE: Bilateral screening digital craniocaudal and mediolateral oblique mammograms were obtained. Bilateral screening digital breast tomosynthesis was performed. The images were evaluated with computer-aided detection. COMPARISON:  Previous exam(s). ACR Breast Density Category c: The breasts are heterogeneously dense, which may obscure small masses. FINDINGS: There are no findings suspicious for malignancy. IMPRESSION: No mammographic evidence of malignancy. A result letter of this screening mammogram will be  mailed directly to the patient. RECOMMENDATION: Screening mammogram in one year. (Code:SM-B-01Y) BI-RADS CATEGORY  1: Negative. Electronically Signed   By: Annia Belt M.D.   On: 06/09/2023 13:03    MM 3D SCREENING MAMMOGRAM BILATERAL BREAST  Result Date:  06/09/2023 CLINICAL DATA:  Screening. EXAM: DIGITAL SCREENING BILATERAL MAMMOGRAM WITH TOMOSYNTHESIS AND CAD TECHNIQUE: Bilateral screening digital craniocaudal and mediolateral oblique mammograms were obtained. Bilateral screening digital breast tomosynthesis was performed. The images were evaluated with computer-aided detection. COMPARISON:  Previous exam(s). ACR Breast Density Category c: The breasts are heterogeneously dense, which may obscure small masses. FINDINGS: There are no findings suspicious for malignancy. IMPRESSION: No mammographic evidence of malignancy. A result letter of this screening mammogram will be mailed directly to the patient. RECOMMENDATION: Screening mammogram in one year. (Code:SM-B-01Y) BI-RADS CATEGORY  1: Negative. Electronically Signed   By: Annia Belt M.D.   On: 06/09/2023 13:03         Additional Info: This encounter employed real-time, collaborative documentation. The patient actively reviewed and updated their medical record on a shared screen, ensuring transparency and facilitating joint problem-solving for the problem list, overview, and plan. This approach promotes accurate, informed care. The treatment plan was discussed and reviewed in detail, including medication safety, potential side effects, and all patient questions. We confirmed understanding and comfort with the plan. Follow-up instructions were established, including contacting the office for any concerns, returning if symptoms worsen, persist, or new symptoms develop, and precautions for potential emergency department visits.

## 2023-08-19 NOTE — ED Notes (Signed)
Patient given coke as PO fluid challenge.

## 2023-08-20 ENCOUNTER — Encounter: Payer: Self-pay | Admitting: Internal Medicine

## 2023-08-22 ENCOUNTER — Encounter: Payer: HMO | Admitting: Pharmacist

## 2023-08-23 ENCOUNTER — Other Ambulatory Visit (HOSPITAL_BASED_OUTPATIENT_CLINIC_OR_DEPARTMENT_OTHER): Payer: Self-pay

## 2023-08-23 ENCOUNTER — Ambulatory Visit (INDEPENDENT_AMBULATORY_CARE_PROVIDER_SITE_OTHER): Payer: HMO | Admitting: Internal Medicine

## 2023-08-23 ENCOUNTER — Other Ambulatory Visit: Payer: Self-pay

## 2023-08-23 ENCOUNTER — Encounter: Payer: Self-pay | Admitting: Internal Medicine

## 2023-08-23 VITALS — BP 102/78 | HR 82 | Temp 97.6°F | Ht 64.0 in | Wt 124.8 lb

## 2023-08-23 DIAGNOSIS — Z7985 Long-term (current) use of injectable non-insulin antidiabetic drugs: Secondary | ICD-10-CM

## 2023-08-23 DIAGNOSIS — K920 Hematemesis: Secondary | ICD-10-CM

## 2023-08-23 DIAGNOSIS — R232 Flushing: Secondary | ICD-10-CM

## 2023-08-23 DIAGNOSIS — N1831 Chronic kidney disease, stage 3a: Secondary | ICD-10-CM

## 2023-08-23 DIAGNOSIS — E1122 Type 2 diabetes mellitus with diabetic chronic kidney disease: Secondary | ICD-10-CM

## 2023-08-23 DIAGNOSIS — G250 Essential tremor: Secondary | ICD-10-CM | POA: Diagnosis not present

## 2023-08-23 DIAGNOSIS — K219 Gastro-esophageal reflux disease without esophagitis: Secondary | ICD-10-CM

## 2023-08-23 MED ORDER — TIRZEPATIDE 2.5 MG/0.5ML ~~LOC~~ SOAJ
2.5000 mg | SUBCUTANEOUS | 2 refills | Status: DC
  Filled 2023-08-23: qty 2, 28d supply, fill #0

## 2023-08-23 MED ORDER — OMEPRAZOLE 20 MG PO CPDR
20.0000 mg | DELAYED_RELEASE_CAPSULE | Freq: Every day | ORAL | 0 refills | Status: DC
  Filled 2023-08-23: qty 30, 30d supply, fill #0

## 2023-08-23 MED ORDER — VENLAFAXINE HCL ER 37.5 MG PO CP24
37.5000 mg | ORAL_CAPSULE | Freq: Every day | ORAL | 1 refills | Status: DC
  Filled 2023-08-23: qty 30, 30d supply, fill #0

## 2023-08-23 NOTE — Patient Instructions (Addendum)
Only medications for now #1 your thyroid medicine at 75  #2 your primidone,  3 your Effexor 37.5 mg new lower dose,  #4 Mounjaro restarting at 2.5 mg to control your diabetes,  #5 this is most important omeprazole 20 mg daily to help suspected ulcer heal     VISIT SUMMARY:  You were seen today to follow up on your recent hospital admission and to address several ongoing health issues. You reported a significant illness with vomiting, including an episode of coffee-ground emesis, but no further episodes of dark vomitus. Your tremors have worsened after stopping some medications, and you have experienced hot and cold flashes. You also mentioned urinary leakage and challenges in managing your diabetes due to recent illness and medication changes. Overall, you are feeling better and are keen to ensure your medication regimen is appropriate.  YOUR PLAN:  -GASTROINTESTINAL BLEEDING: You recently experienced vomiting with coffee-ground-like material, likely from a stress ulcer. We will restart Prilosec for 2-3 weeks to help heal the ulcer and provide a 30-day supply. We may use stool cards to check for hidden blood and will reevaluate in two weeks.  -DIABETES MELLITUS: Your diabetes management was disrupted recently. We discussed the importance of controlling your blood sugar and will restart Mounjaro at 5 mg. The prescription will be sent to [facility] pharmacy.  -ESSENTIAL TREMOR: Your essential tremor has worsened after stopping some medications. We will wait for your blood pressure to stabilize before restarting propranolol. We will reevaluate in two weeks.  -HOT FLASHES: You have been experiencing hot flashes and cold spells. We will restart Effexor at a lower dose of 37.5 mg to manage these symptoms and will reevaluate in 2-3 weeks.  -GENERAL HEALTH MAINTENANCE: We discussed the importance of carefully managing your medications due to your recent illness. We will restart only critical  medications initially and hold off on others. You will continue taking Synthroid at 88 mcg, avoid iron pills to prevent stomach upset, and avoid VESIcare due to potential side effects.  INSTRUCTIONS:  Please follow up in two weeks for reevaluation. We may consider a GI referral based on the results of the stool cards.

## 2023-08-23 NOTE — Assessment & Plan Note (Signed)
Essential Tremor   Her long-standing essential tremor is managed with propranolol and primidone. She recently restarted primidone but not propranolol due to borderline low blood pressure. We discussed waiting for blood pressure stabilization before restarting propranolol. We will hold off on propranolol until her blood pressure stabilizes and she is tolerating fluids and food, with a reevaluation in two weeks.

## 2023-08-23 NOTE — Assessment & Plan Note (Signed)
Diabetes Mellitus   She has diabetes mellitus and recently lapsed in taking Mounjaro. We emphasized the importance of glycemic control and discussed the improved diet and overall well-being. We will restart Mounjaro at 5 mg and send the prescription to [facility] pharmacy.

## 2023-08-23 NOTE — Progress Notes (Signed)
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 12/14/2022 Many(>50/hpf) (A)    WBC 12/14/2022 5.6    RBC 12/14/2022 4.69    Platelets 12/14/2022 183.0    Hemoglobin 12/14/2022 13.7    HCT 12/14/2022 41.4    MCV 12/14/2022 88.2    MCHC 12/14/2022 33.0    RDW 12/14/2022 17.7 (H)    Sodium 12/14/2022 141    Potassium 12/14/2022 4.1    Chloride 12/14/2022 105    CO2 12/14/2022 25    Glucose, Bld 12/14/2022 80    BUN 12/14/2022 16    Creatinine, Ser 12/14/2022 1.00    Total Bilirubin 12/14/2022 0.2    Alkaline Phosphatase 12/14/2022 81    AST 12/14/2022 26    ALT 12/14/2022 26    Total Protein 12/14/2022 7.2    Albumin 12/14/2022 4.4    GFR 12/14/2022 53.77 (L)    Calcium 12/14/2022 9.9    Hgb A1c MFr Bld 12/14/2022 6.2    Cholesterol 12/14/2022 154    Triglycerides 12/14/2022 105.0    HDL 12/14/2022 46.20    VLDL 12/14/2022 21.0    LDL Cholesterol  12/14/2022 86    Total CHOL/HDL Ratio 12/14/2022 3    NonHDL 12/14/2022 107.40    TSH 12/14/2022 1.82    No image results found.   LONG TERM MONITOR (3-14 DAYS)  Result Date: 07/01/2023   Patient had a minimum heart rate of 57 bpm, maximum heart rate of 123 bpm, and average heart rate of 75 bpm.   Predominant underlying rhythm was sinus rhythm.   Isolated PACs were rare (<1.0%).   Isolated PVCs were rare (<1.0%).   No triggered events. No malignant arrhythmias.  MM 3D SCREENING MAMMOGRAM BILATERAL BREAST  Result Date: 06/09/2023 CLINICAL DATA:  Screening. EXAM: DIGITAL SCREENING BILATERAL MAMMOGRAM WITH TOMOSYNTHESIS AND CAD TECHNIQUE: Bilateral screening digital craniocaudal and mediolateral oblique mammograms were obtained. Bilateral screening digital breast tomosynthesis was performed. The images were evaluated with computer-aided detection. COMPARISON:  Previous exam(s). ACR Breast Density Category c: The breasts are heterogeneously dense, which may obscure small masses. FINDINGS: There are no findings suspicious for malignancy. IMPRESSION: No mammographic evidence of malignancy. A result letter of this screening mammogram will be mailed directly to the patient. RECOMMENDATION: Screening mammogram in one year. (Code:[id number]) BI-RADS CATEGORY  1: Negative. Electronically Signed   By:  Annia Belt M.D.   On: 06/09/2023 13:03    No results found.       Additional Info: This encounter employed real-time, collaborative documentation. The patient actively reviewed and updated their medical record on a shared screen, ensuring transparency and facilitating joint problem-solving for the problem list, overview, and plan. This approach promotes accurate, informed care. The treatment plan was discussed and reviewed in detail, including medication safety, potential side effects, and all patient questions. We confirmed understanding and comfort with the plan. Follow-up instructions were established,  including contacting the office for any concerns, returning if symptoms worsen, persist, or new symptoms develop, and precautions for potential emergency department visits.

## 2023-08-24 NOTE — Telephone Encounter (Signed)
Patient was seen for an ED follow-up visit with Dr. Jon Billings yesterday.

## 2023-08-29 NOTE — Telephone Encounter (Signed)
Pt was already seen on 08/23/23. This msg was sent prior to this.

## 2023-08-30 ENCOUNTER — Other Ambulatory Visit (HOSPITAL_COMMUNITY): Payer: Self-pay

## 2023-08-31 ENCOUNTER — Ambulatory Visit: Payer: HMO | Admitting: Internal Medicine

## 2023-08-31 ENCOUNTER — Encounter: Payer: Self-pay | Admitting: Internal Medicine

## 2023-08-31 VITALS — BP 139/78 | HR 75 | Temp 98.2°F | Ht 64.0 in | Wt 127.4 lb

## 2023-08-31 DIAGNOSIS — G250 Essential tremor: Secondary | ICD-10-CM

## 2023-08-31 DIAGNOSIS — Z7984 Long term (current) use of oral hypoglycemic drugs: Secondary | ICD-10-CM | POA: Diagnosis not present

## 2023-08-31 DIAGNOSIS — E1122 Type 2 diabetes mellitus with diabetic chronic kidney disease: Secondary | ICD-10-CM | POA: Diagnosis not present

## 2023-08-31 DIAGNOSIS — E639 Nutritional deficiency, unspecified: Secondary | ICD-10-CM | POA: Diagnosis not present

## 2023-08-31 DIAGNOSIS — N1831 Chronic kidney disease, stage 3a: Secondary | ICD-10-CM | POA: Diagnosis not present

## 2023-08-31 DIAGNOSIS — N951 Menopausal and female climacteric states: Secondary | ICD-10-CM | POA: Diagnosis not present

## 2023-08-31 DIAGNOSIS — K219 Gastro-esophageal reflux disease without esophagitis: Secondary | ICD-10-CM | POA: Diagnosis not present

## 2023-08-31 NOTE — Progress Notes (Signed)
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  Result Date: 07/01/2023   Patient had a minimum heart rate of 57 bpm, maximum heart rate of 123 bpm, and average heart rate of 75 bpm.   Predominant underlying rhythm was sinus rhythm.   Isolated PACs were rare (<1.0%).   Isolated PVCs were rare (<1.0%).   No triggered events. No malignant arrhythmias.  MM 3D SCREENING MAMMOGRAM BILATERAL BREAST  Result Date: 06/09/2023 CLINICAL DATA:  Screening. EXAM: DIGITAL SCREENING BILATERAL MAMMOGRAM WITH TOMOSYNTHESIS AND CAD TECHNIQUE: Bilateral screening digital craniocaudal and mediolateral oblique mammograms were obtained. Bilateral screening digital breast tomosynthesis was performed. The images were evaluated with computer-aided detection. COMPARISON:  Previous exam(s). ACR Breast Density  Category c: The breasts are heterogeneously dense, which may obscure small masses. FINDINGS: There are no findings suspicious for malignancy. IMPRESSION: No mammographic evidence of malignancy. A result letter of this screening mammogram will be mailed directly to the patient. RECOMMENDATION: Screening mammogram in one year. (Code:[id number]-01Y) BI-RADS CATEGORY  1: Negative. Electronically Signed   By: Annia Belt M.D.   On: 06/09/2023 13:03    No results found.       Additional Info: This encounter employed real-time, collaborative documentation. The patient actively reviewed and updated their medical record on a shared screen, ensuring transparency and facilitating joint problem-solving for the problem list, overview, and plan. This approach promotes accurate, informed care. The treatment plan was discussed and reviewed in detail, including medication safety, potential side effects, and all patient questions. We confirmed understanding and comfort with the plan. Follow-up instructions were established, including contacting the office for any concerns, returning if symptoms worsen, persist, or new symptoms develop, and precautions for potential emergency department visits.

## 2023-08-31 NOTE — Assessment & Plan Note (Signed)
Essential Tremor (G25.0) Assessment: Functionally significant essential tremor causing inability to manage contact lenses despite current primidone therapy. Patient demonstrates intention tremor on examination with significant impact on activities of daily living. Blood pressure has normalized to 139/78, supporting safety of beta blocker therapy. Plan:  Resume propranolol 10mg  twice daily for tremor control Continue current primidone dosing Eye doctor to evaluate for scleral lens options which may be easier to manage Counseled on availability of adaptive devices including stabilization gloves Monitor for response to dual therapy and any side effects

## 2023-08-31 NOTE — Assessment & Plan Note (Signed)
Vasomotor Symptoms (N95.1) Assessment: Hot flashes partially controlled on reduced-dose Effexor 37.5mg . Plan:  Temporarily hold Effexor for one week during Mounjaro restart May resume Effexor 37.5mg  after one week if no issues with Mounjaro Monitor for symptom control and medication tolerance

## 2023-08-31 NOTE — Assessment & Plan Note (Signed)
Nutritional Deficiencies Assessment: Low folate levels identified on recent labs. Iron levels now normalized at 51 (previously 9). Plan:  Start daily multivitamin with folate Discontinue iron supplementation Dietary counseling on iron-rich foods provided Monitor levels at next routine labs

## 2023-08-31 NOTE — Assessment & Plan Note (Signed)
Type 2 Diabetes Mellitus (E11.9) - Improved Assessment: Excellent glycemic control maintained. Previous kidney function changes have resolved with improved hydration. Ready for medication reduction. Plan:  Continue metformin temporarily May discontinue metformin once Mounjaro tolerance confirmed No current need for glucose monitoring given excellent control Monitor kidney function at next routine labs  Assessment: Successfully maintained significant weight loss from size 14 to size 6, now at 127 pounds. Recent weight gain following medication discontinuation. Ready to resume weight management therapy with modified approach. Plan:  Restart Mounjaro 2.5mg  (reduced dose) with close monitoring Nutrition counseling provided regarding beverage choices Recommended Southern Breeze tea as zero-calorie alternative Goal to maintain current weight rather than further loss Monitor for any GI symptoms with medication restart

## 2023-08-31 NOTE — Patient Instructions (Addendum)
After Visit Summary  Thank you for coming in today! Here's a summary of your visit and important instructions:  Your Health Updates: You've made excellent progress in your recovery and we're making several positive changes to your medications to help maintain your health while keeping things as simple as possible.  Medication Changes: Starting Again: - Mounjaro 2.5mg  weekly injection   * This is a lower dose to help prevent any stomach issues   * Use for diabetes control and to maintain your healthy weight - Propranolol 10mg  twice daily   * This should help with your hand tremors and make contact lens handling easier   * Also helps maintain healthy blood pressure  New: - Daily multivitamin with folate   * Take one daily with food   * This will help maintain healthy vitamin levels  Stopping: - Iron supplements (your levels are now good!) - Omeprazole (since your stomach is doing better) - Hold Effexor for one week, then you may restart at 37.5mg  if doing well with Greggory Keen  Continuing: - Your eye medications as prescribed - Primidone for tremors - Metformin for now (we may stop this later if Mounjaro works well)  Lifestyle Recommendations: - Try Southern Breeze tea as a healthy alternative to sodas   * Available on [employer] in various flavors   * Zero calories and no artificial sweeteners - Consider switching to Sprite Zero instead of regular Sprite - Continue maintaining good hydration with clear liquids - Keep up your great work with healthy eating habits  Monitoring Your Health: - Watch for any stomach upset with the new Mounjaro dose - Pay attention to how your tremors respond to the added propranolol - Notice if you have any heartburn after stopping omeprazole  Follow-Up Plans: - Optional appointment scheduled for 09/05/23 - You may keep this appointment or cancel the day before if you're doing well - Contact us sooner if you have any concerns about medication  changes  When to Call us: - If you experience any nausea or vomiting - If your tremors worsen significantly - If you develop any new symptoms that concern you - For any questions about your medications  You've done an excellent job managing your health and communicating your needs. Remember to check MyChart for updates and to contact us with any questions!

## 2023-08-31 NOTE — Assessment & Plan Note (Signed)
GERD (K21.9) - Resolved Assessment: No current reflux symptoms. Previously treated with omeprazole 20mg  daily. Plan:  Discontinue omeprazole Use Pepcid Complete as needed for breakthrough symptoms Monitor for symptom recurrence

## 2023-09-05 ENCOUNTER — Ambulatory Visit (INDEPENDENT_AMBULATORY_CARE_PROVIDER_SITE_OTHER): Payer: HMO | Admitting: Internal Medicine

## 2023-09-05 ENCOUNTER — Encounter: Payer: Self-pay | Admitting: Internal Medicine

## 2023-09-05 ENCOUNTER — Telehealth: Payer: Self-pay | Admitting: Internal Medicine

## 2023-09-05 VITALS — BP 122/75 | HR 76 | Temp 97.9°F | Ht 64.0 in | Wt 124.2 lb

## 2023-09-05 DIAGNOSIS — G250 Essential tremor: Secondary | ICD-10-CM

## 2023-09-05 DIAGNOSIS — L509 Urticaria, unspecified: Secondary | ICD-10-CM

## 2023-09-05 DIAGNOSIS — R1012 Left upper quadrant pain: Secondary | ICD-10-CM

## 2023-09-05 DIAGNOSIS — G8929 Other chronic pain: Secondary | ICD-10-CM | POA: Diagnosis not present

## 2023-09-05 DIAGNOSIS — R5383 Other fatigue: Secondary | ICD-10-CM | POA: Diagnosis not present

## 2023-09-05 DIAGNOSIS — L989 Disorder of the skin and subcutaneous tissue, unspecified: Secondary | ICD-10-CM

## 2023-09-05 DIAGNOSIS — R109 Unspecified abdominal pain: Secondary | ICD-10-CM | POA: Diagnosis not present

## 2023-09-05 DIAGNOSIS — R519 Headache, unspecified: Secondary | ICD-10-CM

## 2023-09-05 DIAGNOSIS — R232 Flushing: Secondary | ICD-10-CM

## 2023-09-05 DIAGNOSIS — R5382 Chronic fatigue, unspecified: Secondary | ICD-10-CM | POA: Insufficient documentation

## 2023-09-05 DIAGNOSIS — R251 Tremor, unspecified: Secondary | ICD-10-CM

## 2023-09-05 HISTORY — DX: Urticaria, unspecified: L50.9

## 2023-09-05 MED ORDER — MULTI-VITAMIN/MINERALS PO TABS: 1.0000 | ORAL_TABLET | Freq: Every day | ORAL | 3 refills | Status: DC

## 2023-09-05 MED ORDER — GABAPENTIN 300 MG PO CAPS: 300.0000 mg | ORAL_CAPSULE | Freq: Three times a day (TID) | ORAL | 3 refills | Status: DC

## 2023-09-05 NOTE — Assessment & Plan Note (Signed)
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

## 2023-09-05 NOTE — Assessment & Plan Note (Signed)
Urticaria (L50.9)  New onset urticaria temporally associated with Mounjaro reinitiation Plan:  Discontinue Mounjaro for 2-3 weeks to evaluate causality May continue diphenhydramine and loratadine as needed for symptom control Continue oatmeal-based topical treatments Trial discontinuation of venlafaxine for one week as secondary potential cause Return if symptoms worsen or fail to improve

## 2023-09-05 NOTE — Assessment & Plan Note (Signed)
New onset left-sided pain, improves with pressure Plan:  CT kidney stone protocol ordered Monitor symptoms Follow up with results

## 2023-09-05 NOTE — Assessment & Plan Note (Signed)
Improved symptom control with combined propranolol/primidone therapy Plan:  Adjust primidone dosing to once daily morning and three at bedtime Continue propranolol at current dose Monitor response to dose adjustment May discontinue primidone if inadequate benefit

## 2023-09-05 NOTE — Assessment & Plan Note (Signed)
Plan:  New dermatology referral placed for evaluation Monitor for changes in appearance

## 2023-09-05 NOTE — Patient Instructions (Addendum)
Itching- suspect due to [facility], stop for a few weeks  Ok to trial stop Effexor for a week as well, then restart it at the end of this week and if itching comes back blame Effexor Ok to use benadryl and claritin Headaches- suspect due to corneal implants and eye pressure issues.  Advise to see your ophthalmologist , urgently if worsening.  For pain can use tylenol or Excedrin For fatigue, take multivitamin with b-complex, minerals, and also vitamin D tablet. Likely due to recent severe gastrointestinal illness leading to vitamin depletion For hot flashes, try off Effexor this week and use gabapentin if needed, don't drive on it it is sedating. For left flank pain we will do CT kidney stone protocol  # After Visit Summary  Dear [name],   Thank you for your visit today. Here is a summary of your care plan:  ## Medication Changes 1. STOP taking Mounjaro for now 2. STOP Effexor for one week, then restart unless instructed otherwise 3. NEW prescription for gabapentin 300mg  - take three times daily as needed for hot flashes    * Take care with driving due to possible drowsiness 4. NEW daily multivitamin with B complex and minerals 5. Reduce primidone to one tablet in morning and three at night  ## For Your Itching/Hives * Continue using oatmeal-based soaps and Aveeno products * You may take Benadryl or Claritin as needed * Call if symptoms worsen or don't improve within 2-3 weeks  ## For Your Headaches * You may take Tylenol or Excedrin for pain * Contact your eye doctor immediately if headaches worsen * Continue regular eye pressure checks every 6 months  ## Tests Ordered * CT scan of kidneys (to check left side pain) * Please schedule this at your earliest convenience  ## Referrals * New dermatology referral placed - they will contact you * Keep your scheduled eye doctor appointments  ## Follow-Up Appointments * Next appointment scheduled for December 10th * Your upcoming  appointments include:   - September 21, 2023 at 10:20 AM with Dr. Corliss Skains   - September 23, 2023 at 8:40 AM with Dr. [PCP]   - September 26, 2023 at 8:40 AM with Dr. Cristal Deer  ## Call Our Office If You Experience: * Worsening itching or hives * Severe headaches * Increased flank pain * Unusual drowsiness from gabapentin * Any other concerning symptoms  Remember to bring an updated list of any questions or concerns to your next visit.

## 2023-09-05 NOTE — Assessment & Plan Note (Signed)
Hot flashes and night sweats Plan:  Trial discontinuation of venlafaxine for one week Initiate gabapentin 300mg  TID as needed for vasomotor symptoms Counsel on sedation risk and driving precautions Monitor response and side effects

## 2023-09-05 NOTE — Progress Notes (Signed)
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 SCREENING BILATERAL MAMMOGRAM WITH TOMOSYNTHESIS AND CAD TECHNIQUE: Bilateral screening digital craniocaudal and mediolateral oblique mammograms were obtained. Bilateral screening digital breast tomosynthesis was performed. The images were evaluated with computer-aided detection. COMPARISON:  Previous exam(s). ACR Breast Density Category c: The breasts are heterogeneously dense, which may obscure small masses. FINDINGS: There are no findings suspicious for malignancy. IMPRESSION: No mammographic evidence of malignancy. A result letter of this screening mammogram will be mailed directly to the patient. RECOMMENDATION: Screening mammogram in one year. (Code:SM-B-01Y) BI-RADS CATEGORY  1: Negative.  Electronically Signed   By: Annia Belt M.D.   On: 06/09/2023 13:03    No results found.       Additional Info: This encounter employed real-time, collaborative documentation. The patient actively reviewed and updated their medical record on a shared screen, ensuring transparency and facilitating joint problem-solving for the problem list, overview, and plan. This approach promotes accurate, informed care. The treatment plan was discussed and reviewed in detail, including medication safety, potential side effects, and all patient questions. We confirmed understanding and comfort with the plan. Follow-up instructions were established, including contacting the office for any concerns, returning if symptoms worsen, persist, or new symptoms develop, and precautions for potential emergency department visits.

## 2023-09-05 NOTE — Telephone Encounter (Signed)
Advised to see provider. Has an appt 09/05/23  Patient Name First: Laura Squibb Last: Farrell Gender: Female DOB: 07-29-1946 Age: 77 Y 8 M 22 D Return Phone Number: [phone] (Primary) Address: City/ State/ Zip: [place]  [address] Client [facility] - Human resources officer [facility] Night Provider Glenetta Hew- MD Contact Type Call Who Is Calling Patient / Member / Family / Caregiver Call Type Triage / Clinical Relationship To Patient Self Return Phone Number [phone] (Primary) Chief Complaint Headache Reason for Call Symptomatic / Request for Health Information Initial Comment Caller states her doctor changed her medicine. She is itchy all over and she says it feels like hives. She also has a headache. Translation No Nurse Assessment Nurse: Winifred Olive, RN, Shanda Bumps Date/Time (Eastern Time): 09/03/2023 5:07:12 PM Confirm and document reason for call. If symptomatic, describe symptoms. ---Caller states that she has been changing medication dosages over the past month to curtail side effects. Stopped gabapentin one month ago. Takes levothyroxine, primidone, effexor; doses were reduced two weeks ago. Today she is itching all over, has a red bumpy rash on her arms and legs. Does the patient have any new or worsening symptoms? ---Yes Will a triage be completed? ---Yes Related visit to physician within the last 2 weeks? ---Yes Does the PT have any chronic conditions? (i.e. diabetes, asthma, this includes High risk factors for pregnancy, etc.) ---No Is this a behavioral health or substance abuse call? ---No Guidelines Guideline Title Affirmed Question Affirmed Notes Nurse Date/Time (Eastern Time) Rash or Redness - Widespread [1] Headache AND [2] no fever Winifred Olive, RN, Shanda Bumps 09/03/2023 5:12:31 PM Disp. Time Lamount Cohen Time) Disposition Final User 09/03/2023 5:18:51 PM See HCP within 4 Hours (or  PCP triage) Yes Winifred Olive, RN, Shanda Bumps Final Disposition 09/03/2023 5:18:51 PM See HCP within 4 Hours (or PCP triage) Yes Winifred Olive, RN, Rockney Ghee Disagree/Comply Disagree Caller Understands Yes PreDisposition Did not know what to do Care Advice Given Per Guideline SEE HCP (OR PCP TRIAGE) WITHIN 4 HOURS: * IF OFFICE WILL BE CLOSED AND NO PCP (PRIMARY CARE PROVIDER) SECOND-LEVEL TRIAGE: You need to be seen within the next 3 or 4 hours. A nearby Urgent Care Center [facility]) is often a good source of care. Another choice is to go to the ED. Go sooner if you become worse. CALL BACK IF: * You become worse CARE ADVICE given per Rash or Redness - Widespread (Adult) guideline. Comments User: Christell Faith, RN Date/Time Lamount Cohen Time): 09/03/2023 5:21:16 PM Caller states she does not want to go to [facility] because wait is too long and she has tried virtual visit in the past and been unsuccessful. Caller states she will call back tomorrow during office hours. Referrals [facility]

## 2023-09-05 NOTE — Telephone Encounter (Signed)
Patient seen Dr. Jon Billings for an OV today (11/25) concerning this.

## 2023-09-05 NOTE — Assessment & Plan Note (Signed)
  Likely related to recent severe gastrointestinal illness and nutritional deficiency Plan:  Start daily multivitamin with B-complex and minerals Add vitamin D supplementation Monitor response to supplementation
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
## 2023-09-14 ENCOUNTER — Ambulatory Visit: Payer: HMO | Admitting: Internal Medicine

## 2023-09-14 ENCOUNTER — Encounter: Payer: Self-pay | Admitting: Internal Medicine

## 2023-09-14 VITALS — BP 122/77 | HR 77 | Temp 98.0°F | Resp 17 | Ht 64.0 in | Wt 131.6 lb

## 2023-09-14 DIAGNOSIS — E538 Deficiency of other specified B group vitamins: Secondary | ICD-10-CM

## 2023-09-14 DIAGNOSIS — E1122 Type 2 diabetes mellitus with diabetic chronic kidney disease: Secondary | ICD-10-CM

## 2023-09-14 DIAGNOSIS — R809 Proteinuria, unspecified: Secondary | ICD-10-CM | POA: Diagnosis not present

## 2023-09-14 DIAGNOSIS — R251 Tremor, unspecified: Secondary | ICD-10-CM | POA: Diagnosis not present

## 2023-09-14 DIAGNOSIS — E039 Hypothyroidism, unspecified: Secondary | ICD-10-CM

## 2023-09-14 DIAGNOSIS — K5903 Drug induced constipation: Secondary | ICD-10-CM

## 2023-09-14 DIAGNOSIS — N183 Chronic kidney disease, stage 3 unspecified: Secondary | ICD-10-CM

## 2023-09-14 DIAGNOSIS — R519 Headache, unspecified: Secondary | ICD-10-CM

## 2023-09-14 DIAGNOSIS — E559 Vitamin D deficiency, unspecified: Secondary | ICD-10-CM | POA: Insufficient documentation

## 2023-09-14 DIAGNOSIS — N393 Stress incontinence (female) (male): Secondary | ICD-10-CM

## 2023-09-14 DIAGNOSIS — Z7985 Long-term (current) use of injectable non-insulin antidiabetic drugs: Secondary | ICD-10-CM | POA: Diagnosis not present

## 2023-09-14 DIAGNOSIS — N1832 Chronic kidney disease, stage 3b: Secondary | ICD-10-CM

## 2023-09-14 DIAGNOSIS — G8929 Other chronic pain: Secondary | ICD-10-CM

## 2023-09-14 DIAGNOSIS — R351 Nocturia: Secondary | ICD-10-CM | POA: Diagnosis not present

## 2023-09-14 HISTORY — DX: Drug induced constipation: K59.03

## 2023-09-14 HISTORY — DX: Deficiency of other specified B group vitamins: E53.8

## 2023-09-14 MED ORDER — SOLIFENACIN SUCCINATE 10 MG PO TABS: 10.0000 mg | ORAL_TABLET | Freq: Every day | ORAL | 3 refills | Status: DC

## 2023-09-14 MED ORDER — FIBERCON 625 MG PO TABS: 1250.0000 mg | ORAL_TABLET | Freq: Every day | ORAL | 3 refills | Status: DC

## 2023-09-14 MED ORDER — PROPRANOLOL HCL 10 MG PO TABS: 10.0000 mg | ORAL_TABLET | Freq: Two times a day (BID) | ORAL | 3 refills | Status: DC

## 2023-09-14 MED ORDER — FOLIC ACID 1 MG PO TABS: 1.0000 mg | ORAL_TABLET | Freq: Every day | ORAL | 3 refills | Status: DC

## 2023-09-14 MED ORDER — CALCIUM CARB-CHOLECALCIFEROL 500-5 MG-MCG PO TABS: 1.0000 | ORAL_TABLET | Freq: Every day | ORAL | 6 refills | Status: DC

## 2023-09-14 NOTE — Assessment & Plan Note (Signed)
Constipation worsened by [facility] Their chronic constipation is likely due to low fiber intake. We discussed the importance of dietary fiber and hydration and will start Fibercon caplets, increase dietary fiber intake, and ensure adequate hydration.

## 2023-09-14 NOTE — Assessment & Plan Note (Signed)
Recheck, if persisting will need to add back renoprotective medication(s)

## 2023-09-14 NOTE — Assessment & Plan Note (Signed)
Continue(s) with Mounjaro 2.5 for now, although may have caused severe gastroenteritis may boost later. Need to address constipation. Lab Results  Component Value Date   HGBA1C 5.2 07/12/2023   HGBA1C 6.2 12/14/2022   HGBA1C 6.4 06/11/2022

## 2023-09-14 NOTE — Assessment & Plan Note (Signed)
Migraine and Tension Headaches They experience weekly headaches, sometimes behind the left eye or in the neck, but currently have no headache. There are no signs of temporal arteritis or glaucoma, suggesting a combination of migraine and tension headaches. We discussed the potential for an MRI if symptoms worsen or new symptoms develop. We will monitor headache frequency and severity and consider an MRI under these conditions.

## 2023-09-14 NOTE — Assessment & Plan Note (Signed)
Interim history: denies fatigue, weight changes, heat/cold intolerance, bowel/skin changes or CVS symptoms    Latest Ref Rng & Units 08/05/2023   10:51 AM 07/12/2023    8:45 AM 06/08/2023    3:02 PM 12/14/2022    8:56 AM 08/17/2022    8:55 AM 06/11/2022   11:19 AM 11/04/2021   12:20 PM  THYROID  TSH 0.35 - 5.50 uIU/mL   0.30  1.82  2.00  0.25  4.01   T4,Free(Direct) 0.8 - 1.8 ng/dL 1.5  1.4    1.19  1.47    No need to change medication(s) today, encouraged continuing with same amount and will recheck in 3-6 months

## 2023-09-14 NOTE — Progress Notes (Signed)
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 Urine 03/16/2023 NEGATIVE    Hgb urine dipstick 03/16/2023 NEGATIVE    Urobilinogen, UA 03/16/2023 0.2    Leukocytes,Ua 03/16/2023 MODERATE (A)    Nitrite 03/16/2023 NEGATIVE    WBC, UA 03/16/2023 21-50/hpf (A)    RBC / HPF 03/16/2023 0-2/hpf    Squamous Epithelial / HPF 03/16/2023 Rare(0-4/hpf)    Bacteria, UA 03/16/2023 Many(>50/hpf) (A)   Office Visit on 12/14/2022  Component Date Value   Color, Urine 12/14/2022 YELLOW    APPearance 12/14/2022 Cloudy (A)    Specific Gravity, Urine 12/14/2022 1.020    pH 12/14/2022 6.0    Total Protein, Urine 12/14/2022 NEGATIVE    Urine Glucose 12/14/2022 >=1000 (A)    Ketones, ur 12/14/2022 NEGATIVE    Bilirubin Urine 12/14/2022 NEGATIVE    Hgb urine dipstick 12/14/2022 NEGATIVE    Urobilinogen, UA 12/14/2022  0.2    Leukocytes,Ua 12/14/2022 SMALL (A)    Nitrite 12/14/2022 POSITIVE (A)    WBC, UA 12/14/2022 TNTC(>50/hpf) (A)    RBC / HPF 12/14/2022 0-2/hpf    Squamous Epithelial / HPF 12/14/2022 Rare(0-4/hpf)    Bacteria, UA 12/14/2022 Many(>50/hpf) (A)    WBC 12/14/2022 5.6    RBC 12/14/2022 4.69    Platelets 12/14/2022 183.0    Hemoglobin 12/14/2022 13.7    HCT 12/14/2022 41.4    MCV 12/14/2022 88.2    MCHC 12/14/2022 33.0    RDW 12/14/2022 17.7 (H)    Sodium 12/14/2022 141    Potassium 12/14/2022 4.1    Chloride 12/14/2022 105    CO2 12/14/2022 25    Glucose, Bld 12/14/2022 80    BUN 12/14/2022 16    Creatinine, Ser 12/14/2022 1.00    Total Bilirubin 12/14/2022 0.2    Alkaline Phosphatase 12/14/2022 81    AST 12/14/2022 26    ALT 12/14/2022 26    Total Protein 12/14/2022 7.2    Albumin 12/14/2022 4.4    GFR 12/14/2022 53.77 (L)    Calcium 12/14/2022 9.9    Hgb A1c MFr Bld 12/14/2022 6.2    Cholesterol 12/14/2022 154    Triglycerides 12/14/2022 105.0    HDL 12/14/2022 46.20  VLDL 12/14/2022 21.0    LDL Cholesterol 12/14/2022 86    Total CHOL/HDL Ratio 12/14/2022 3    NonHDL 12/14/2022 107.40    TSH 12/14/2022 1.82    No image results found.   LONG TERM MONITOR (3-14 DAYS)  Result Date: 07/01/2023   Patient had a minimum heart rate of 57 bpm, maximum heart rate of 123 bpm, and average heart rate of 75 bpm.   Predominant underlying rhythm was sinus rhythm.   Isolated PACs were rare (<1.0%).   Isolated PVCs were rare (<1.0%).   No triggered events. No malignant arrhythmias.        Additional Info: This encounter employed real-time, collaborative documentation. The patient actively reviewed and updated their medical record on a shared screen, ensuring transparency and facilitating joint problem-solving for the problem list, overview, and plan. This approach promotes accurate, informed care. The treatment plan was discussed and reviewed in detail, including medication safety,  potential side effects, and all patient questions. We confirmed understanding and comfort with the plan. Follow-up instructions were established, including contacting the office for any concerns, returning if symptoms worsen, persist, or new symptoms develop, and precautions for potential emergency department visits.

## 2023-09-14 NOTE — Assessment & Plan Note (Signed)
Restart vitamin D  Encouraged patient to walk and weight bearing exercise.  Lab Results  Component Value Date   VD25OH 40.59 06/08/2023   VD25OH 40.43 03/14/2023   VD25OH 15.55 (L) 08/17/2022   Lab Results  Component Value Date   TSH 0.30 (L) 06/08/2023   ALKPHOS 94 08/19/2023   PTH 60 03/16/2023   CALCIUM 10.0 08/19/2023   PHOS 4.5 08/17/2022   Lab Results  Component Value Date   CALCIUM 10.0 08/19/2023   CALCIUM 9.7 08/05/2023   CALCIUM 10.1 07/12/2023   CALCIUM 9.6 06/08/2023   CALCIUM 8.9 03/14/2023   03/2021 Dexa; normal. Recheck 3-5 years

## 2023-09-14 NOTE — Assessment & Plan Note (Signed)
Sent in and encouraged patient to take folate Lab Results  Component Value Date   FOLATE 3.1 (L) 08/05/2023

## 2023-09-14 NOTE — Assessment & Plan Note (Signed)
Chronic Kidney Disease Stage 3A Their kidney function is well-managed, with recent labs showing improvement from a previous dehydration episode. We will continue monitoring kidney function with regular labs.

## 2023-09-14 NOTE — Assessment & Plan Note (Signed)
Essential Tremor Propranolol 10 mg twice daily effectively manages their essential tremor without adverse effects, and they prefer it over primidone. We will continue propranolol at the current dose and refill it for one year.

## 2023-09-14 NOTE — Patient Instructions (Addendum)
VISIT SUMMARY:  During your visit, we discussed your current medication regimen and addressed several health concerns, including headaches, constipation, back pain, stress incontinence, and general health maintenance. We reviewed your recent lab results and made adjustments to your treatment plan to better manage your symptoms and overall health.  YOUR PLAN:  -ESSENTIAL TREMOR: Essential tremor is a nervous system disorder that causes involuntary and rhythmic shaking. We will continue your current dose of propranolol 10 mg twice daily as it effectively manages your symptoms without adverse effects. Your prescription will be refilled for one year.  -MIGRAINE AND TENSION HEADACHES: You experience weekly headaches that are likely a combination of migraine and tension headaches. We will monitor the frequency and severity of your headaches and consider an MRI if your symptoms worsen or new symptoms develop.  -STRESS INCONTINENCE: Stress incontinence is the unintentional loss of urine during physical activity. We will continue your solifenacin (VESIcare) medication, which effectively prevents leakage. Your prescription will be refilled for one year, and we encourage you to perform Kegel exercises.  -HYPOTHYROIDISM: Hypothyroidism is a condition where the thyroid gland does not produce enough thyroid hormone. We will continue your Synthroid 88 mcg daily as you are not experiencing any new issues.  -CHRONIC KIDNEY DISEASE STAGE 3A: Chronic kidney disease stage 3A indicates moderate kidney damage. Your kidney function is stable, and we will continue to monitor it with regular lab tests.  -CONSTIPATION: Constipation is difficulty in passing stools often due to low fiber intake. We recommend starting Fibercon caplets, increasing dietary fiber intake, and ensuring adequate hydration to manage this issue.  -GENERAL HEALTH MAINTENANCE: We discussed the importance of taking a multivitamin and supplements for B12,  vitamin D, calcium, and folic acid to support bone health and overall wellness. We encourage weight-bearing and low-impact exercises like Tai Chi and walking.  INSTRUCTIONS:  Please follow up with a heart specialist, monitor for any adverse effects from your medications, and return for a follow-up visit as needed.    It was a pleasure seeing you today! Your health and satisfaction are our top priorities.  Glenetta Hew, MD  Your Providers PCP: Lula Olszewski, MD,  [phone]) Referring Provider: Lula Olszewski, MD,  [phone]) Care Team Provider: Bernette Redbird, MD,  [phone]) Care Team Provider: Suzi Roots Care Team Provider: Domingo Mend,  [phone]) Care Team Provider: Holli Humbles, MD,  [phone]) Care Team Provider: Vladimir Faster, DO,  [phone]) Care Team Provider: Reece Packer, MD Care Team Provider: Eugenia Mcalpine, MD,  [phone]) Care Team Provider: Maeola Harman, MD,  [phone]) Care Team Provider: Dahlia Byes, [facility]     NEXT STEPS: [x]  Early Intervention: Schedule sooner appointment, call our on-call services, or go to emergency room if there is any significant Increase in pain or discomfort New or worsening symptoms Sudden or severe changes in your health [x]  Flexible Follow-Up: We recommend a No follow-ups on file. for optimal routine care. This allows for progress monitoring and treatment adjustments. [x]  Preventive Care: Schedule your annual preventive care visit! It's typically covered by insurance and helps identify potential health issues early. [x]  Lab & X-ray Appointments: Incomplete tests scheduled today, or call to schedule. X-rays:  [facility] (M-F, 8:30am-noon or 1pm-5pm). [x]  Medical Information Release: Sign a release form at front desk to obtain relevant medical information we don't have.  MAKING THE MOST OF OUR FOCUSED 20 MINUTE APPOINTMENTS: [x]   Clearly state your top  concerns at the beginning of the visit to  focus our discussion [x]   If you anticipate you will need more time, please inform the front desk during scheduling - we can book multiple appointments in the same week. [x]   If you have transportation problems- use our convenient video appointments or ask about transportation support. [x]   We can get down to business faster if you use MyChart to update information before the visit and submit non-urgent questions before your visit. Thank you for taking the time to provide details through MyChart.  Let our nurse know and she can import this information into your encounter documents.  Arrival and Wait Times: [x]   Arriving on time ensures that everyone receives prompt attention. [x]   Early morning (8a) and afternoon (1p) appointments tend to have shortest wait times. [x]   Unfortunately, we cannot delay appointments for late arrivals or hold slots during phone calls.  Getting Answers and Following Up [x]   Simple Questions & Concerns: For quick questions or basic follow-up after your visit, reach [place] at [phone] or MyChart messaging. [x]   Complex Concerns: If your concern is more complex, scheduling an appointment might be best. Discuss this with the staff to find the most suitable option. [x]   Lab & Imaging Results: We'll contact you directly if results are abnormal or you don't use MyChart. Most normal results will be on MyChart within 2-3 business days, with a review message from Dr. Jon Billings. Haven't heard back in 2 weeks? Need results sooner? Contact us at [phone]. [x]   Referrals: Our referral coordinator will manage specialist referrals. The specialist's office should contact you within 2 weeks to schedule an appointment. Call us if you haven't heard from them after 2 weeks.  Staying Connected [x]   MyChart: Activate your MyChart for the fastest way to access results and message [place]. See the last page of this paperwork for instructions on how to  activate.  Bring to Your Next Appointment [x]   Medications: Please bring all your medication bottles to your next appointment to ensure we have an accurate record of your prescriptions. [x]   Health Diaries: If you're monitoring any health conditions at home, keeping a diary of your readings can be very helpful for discussions at your next appointment.  Billing [x]   X-ray & Lab Orders: These are billed by separate companies. Contact the invoicing company directly for questions or concerns. [x]   Visit Charges: Discuss any billing inquiries with our administrative services team.  Your Satisfaction Matters [x]   Share Your Experience: We strive for your satisfaction! If you have any complaints, or preferably compliments, please let Dr. Jon Billings know directly or contact our Practice Administrators, Edwena Felty or [employer], by asking at the front desk.   Reviewing Your Records [x]   Review this early draft of your clinical encounter notes below and the final encounter summary tomorrow on MyChart after its been completed.  All orders placed so far are visible here: Stress incontinence -     Solifenacin Succinate; Take 1 tablet (10 mg total) by mouth daily.  Dispense: 90 tablet; Refill: 3 -     Urinalysis, Complete  Type 2 diabetes mellitus with stage 3 chronic kidney disease, without long-term current use of insulin, unspecified whether stage 3a or 3b CKD (HCC) Assessment & Plan: Continue(s) with Mounjaro 2.5 for now, although may have caused severe gastroenteritis may boost later. Need to address constipation. Lab Results  Component Value Date   HGBA1C 5.2 07/12/2023   HGBA1C 6.2 12/14/2022   HGBA1C 6.4 06/11/2022      Tremor  Assessment & Plan: Essential Tremor Propranolol 10 mg twice daily effectively manages their essential tremor without adverse effects, and they prefer it over primidone. We will continue propranolol at the current dose and refill it for one year.  Orders: -      Propranolol HCl; Take 1 tablet (10 mg total) by mouth 2 (two) times daily.  Dispense: 180 tablet; Refill: 3  Vitamin D deficiency Assessment & Plan: Restart vitamin D  Encouraged patient to walk and weight bearing exercise.  Lab Results  Component Value Date   VD25OH 40.59 06/08/2023   VD25OH 40.43 03/14/2023   VD25OH 15.55 (L) 08/17/2022   Lab Results  Component Value Date   TSH 0.30 (L) 06/08/2023   ALKPHOS 94 08/19/2023   PTH 60 03/16/2023   CALCIUM 10.0 08/19/2023   PHOS 4.5 08/17/2022   Lab Results  Component Value Date   CALCIUM 10.0 08/19/2023   CALCIUM 9.7 08/05/2023   CALCIUM 10.1 07/12/2023   CALCIUM 9.6 06/08/2023   CALCIUM 8.9 03/14/2023   03/2021 Dexa; normal. Recheck 3-5 years    Orders: -     Calcium Carb-Cholecalciferol; Take 1 tablet by mouth daily at 6 (six) AM.  Dispense: 90 tablet; Refill: 6  Folic acid deficiency Assessment & Plan: Sent in and encouraged patient to take folate Lab Results  Component Value Date   FOLATE 3.1 (L) 08/05/2023    Orders: -     Folic Acid; Take 1 tablet (1 mg total) by mouth daily.  Dispense: 90 tablet; Refill: 3  Drug-induced constipation Assessment & Plan: Constipation worsened by mounjaro Their chronic constipation is likely due to low fiber intake. We discussed the importance of dietary fiber and hydration and will start Fibercon caplets, increase dietary fiber intake, and ensure adequate hydration.  Orders: -     FiberCon; Take 2 tablets (1,250 mg total) by mouth daily.  Dispense: 180 tablet; Refill: 3  Proteinuria, unspecified type Assessment & Plan: Recheck, if persisting will need to add back renoprotective medication(s)   Orders: -     Urinalysis, Complete -     Protein / creatinine ratio, urine  Nocturia -     Urinalysis, Complete -     Protein / creatinine ratio, urine  Chronic left-sided headaches Assessment & Plan: Migraine and Tension Headaches They experience weekly headaches, sometimes  behind the left eye or in the neck, but currently have no headache. There are no signs of temporal arteritis or glaucoma, suggesting a combination of migraine and tension headaches. We discussed the potential for an MRI if symptoms worsen or new symptoms develop. We will monitor headache frequency and severity and consider an MRI under these conditions.   Acquired hypothyroidism Assessment & Plan: Interim history: denies fatigue, weight changes, heat/cold intolerance, bowel/skin changes or CVS symptoms    Latest Ref Rng & Units 08/05/2023   10:51 AM 07/12/2023    8:45 AM 06/08/2023    3:02 PM 12/14/2022    8:56 AM 08/17/2022    8:55 AM 06/11/2022   11:19 AM 11/04/2021   12:20 PM  THYROID  TSH 0.35 - 5.50 uIU/mL   0.30  1.82  2.00  0.25  4.01   T4,Free(Direct) 0.8 - 1.8 ng/dL 1.5  1.4    0.10  2.72    No need to change medication(s) today, encouraged continuing with same amount and will recheck in 3-6 months    Chronic kidney disease, stage 3b (HCC) Assessment & Plan: Chronic Kidney Disease Stage 3A Their kidney  function is well-managed, with recent labs showing improvement from a previous dehydration episode. We will continue monitoring kidney function with regular labs.

## 2023-09-15 LAB — URINALYSIS, COMPLETE
Bilirubin Urine: NEGATIVE
Glucose, UA: NEGATIVE
Hyaline Cast: NONE SEEN /[LPF]
Ketones, ur: NEGATIVE
Nitrite: POSITIVE — AB
Specific Gravity, Urine: 1.016 (ref 1.001–1.035)
pH: 6 (ref 5.0–8.0)

## 2023-09-15 LAB — PROTEIN / CREATININE RATIO, URINE
Creatinine, Urine: 128 mg/dL (ref 20–275)
Protein/Creat Ratio: 156 mg/g{creat} (ref 24–184)
Protein/Creatinine Ratio: 0.156 mg/mg{creat} (ref 0.024–0.184)
Total Protein, Urine: 20 mg/dL (ref 5–24)

## 2023-09-16 ENCOUNTER — Ambulatory Visit
Admission: RE | Admit: 2023-09-16 | Discharge: 2023-09-16 | Disposition: A | Discharge status: Home / Self Care | Payer: HMO | Source: Ambulatory Visit | Attending: Internal Medicine | Admitting: Internal Medicine

## 2023-09-16 DIAGNOSIS — K449 Diaphragmatic hernia without obstruction or gangrene: Secondary | ICD-10-CM | POA: Diagnosis not present

## 2023-09-16 DIAGNOSIS — R109 Unspecified abdominal pain: Secondary | ICD-10-CM | POA: Diagnosis not present

## 2023-09-16 DIAGNOSIS — I700 Atherosclerosis of aorta: Secondary | ICD-10-CM | POA: Diagnosis not present

## 2023-09-16 DIAGNOSIS — R1012 Left upper quadrant pain: Secondary | ICD-10-CM

## 2023-09-18 ENCOUNTER — Other Ambulatory Visit: Payer: Self-pay | Admitting: Internal Medicine

## 2023-09-18 ENCOUNTER — Encounter: Payer: Self-pay | Admitting: Internal Medicine

## 2023-09-18 DIAGNOSIS — N3000 Acute cystitis without hematuria: Secondary | ICD-10-CM

## 2023-09-18 MED ORDER — NITROFURANTOIN MONOHYD MACRO 100 MG PO CAPS: 100.0000 mg | ORAL_CAPSULE | Freq: Two times a day (BID) | ORAL | 0 refills | Status: DC

## 2023-09-20 NOTE — Telephone Encounter (Signed)
Patient's review of lab results/notes confirmed.

## 2023-09-20 NOTE — Telephone Encounter (Signed)
Not seen by pt yet 09/20/23

## 2023-09-21 ENCOUNTER — Encounter: Payer: Self-pay | Admitting: Rheumatology

## 2023-09-21 ENCOUNTER — Ambulatory Visit: Payer: HMO | Attending: Rheumatology | Admitting: Rheumatology

## 2023-09-21 VITALS — BP 116/75 | HR 73 | Resp 14 | Ht 62.5 in | Wt 134.6 lb

## 2023-09-21 DIAGNOSIS — M19042 Primary osteoarthritis, left hand: Secondary | ICD-10-CM

## 2023-09-21 DIAGNOSIS — M1712 Unilateral primary osteoarthritis, left knee: Secondary | ICD-10-CM

## 2023-09-21 DIAGNOSIS — N3946 Mixed incontinence: Secondary | ICD-10-CM

## 2023-09-21 DIAGNOSIS — M19041 Primary osteoarthritis, right hand: Secondary | ICD-10-CM

## 2023-09-21 DIAGNOSIS — R682 Dry mouth, unspecified: Secondary | ICD-10-CM

## 2023-09-21 DIAGNOSIS — L509 Urticaria, unspecified: Secondary | ICD-10-CM

## 2023-09-21 DIAGNOSIS — Z947 Corneal transplant status: Secondary | ICD-10-CM

## 2023-09-21 DIAGNOSIS — I7300 Raynaud's syndrome without gangrene: Secondary | ICD-10-CM

## 2023-09-21 DIAGNOSIS — M19072 Primary osteoarthritis, left ankle and foot: Secondary | ICD-10-CM

## 2023-09-21 DIAGNOSIS — G250 Essential tremor: Secondary | ICD-10-CM

## 2023-09-21 DIAGNOSIS — N1330 Unspecified hydronephrosis: Secondary | ICD-10-CM

## 2023-09-21 DIAGNOSIS — N1831 Chronic kidney disease, stage 3a: Secondary | ICD-10-CM

## 2023-09-21 DIAGNOSIS — D126 Benign neoplasm of colon, unspecified: Secondary | ICD-10-CM

## 2023-09-21 DIAGNOSIS — Z8669 Personal history of other diseases of the nervous system and sense organs: Secondary | ICD-10-CM

## 2023-09-21 DIAGNOSIS — M19071 Primary osteoarthritis, right ankle and foot: Secondary | ICD-10-CM | POA: Diagnosis not present

## 2023-09-21 DIAGNOSIS — E782 Mixed hyperlipidemia: Secondary | ICD-10-CM

## 2023-09-21 DIAGNOSIS — L719 Rosacea, unspecified: Secondary | ICD-10-CM | POA: Diagnosis not present

## 2023-09-21 DIAGNOSIS — M720 Palmar fascial fibromatosis [Dupuytren]: Secondary | ICD-10-CM | POA: Diagnosis not present

## 2023-09-21 DIAGNOSIS — K219 Gastro-esophageal reflux disease without esophagitis: Secondary | ICD-10-CM

## 2023-09-21 DIAGNOSIS — E039 Hypothyroidism, unspecified: Secondary | ICD-10-CM

## 2023-09-21 DIAGNOSIS — M5416 Radiculopathy, lumbar region: Secondary | ICD-10-CM | POA: Diagnosis not present

## 2023-09-21 DIAGNOSIS — E1122 Type 2 diabetes mellitus with diabetic chronic kidney disease: Secondary | ICD-10-CM | POA: Diagnosis not present

## 2023-09-21 DIAGNOSIS — K144 Atrophy of tongue papillae: Secondary | ICD-10-CM

## 2023-09-21 DIAGNOSIS — K449 Diaphragmatic hernia without obstruction or gangrene: Secondary | ICD-10-CM

## 2023-09-21 DIAGNOSIS — G4733 Obstructive sleep apnea (adult) (pediatric): Secondary | ICD-10-CM

## 2023-09-21 NOTE — Patient Instructions (Addendum)
Raynaud's Phenomenon  Raynaud's phenomenon is a condition that affects the blood vessels (arteries) that carry blood to the fingers and toes. The arteries that supply blood to the ears, lips, nipples, or the tip of the nose might also be affected. Raynaud's phenomenon causes the arteries to become narrow temporarily (spasm). As a result, the flow of blood to the affected areas is temporarily decreased. This usually occurs in response to cold temperatures or stress. During an attack, the skin in the affected areas turns white, then blue, and finally red. A person may also feel tingling or numbness in those areas. Attacks usually last for only a brief period, and then the blood flow to the area returns to normal. In most cases, Raynaud's phenomenon does not cause serious health problems. What are the causes? In many cases, the cause of this condition is not known. The condition may occur on its own (primary Raynaud's phenomenon) or may be associated with other diseases or factors (secondary Raynaud's phenomenon). Possible causes may include: Diseases or medical conditions that damage the arteries. Injuries and repetitive actions that hurt the hands or feet. Being exposed to certain chemicals. Taking medicines that narrow the arteries. Other medical conditions, such as lupus, scleroderma, rheumatoid arthritis, thyroid problems, blood disorders, Sjogren syndrome, or atherosclerosis. What increases the risk? The following factors may make you more likely to develop this condition: Being 75-34 years old. Being female. Having a family history of Raynaud's phenomenon. Living in a cold climate. Smoking. What are the signs or symptoms? Symptoms of this condition usually occur when you are exposed to cold temperatures or when you have emotional stress. The symptoms may last for a few minutes or up to several hours. They usually affect your fingers but may also affect your toes, nipples, lips, ears, or the  tip of your nose. Symptoms may include: Changes in skin color. The skin in the affected areas will turn pale or white. The skin may then change from white to bluish to red as normal blood flow returns to the area. Numbness, tingling, or pain in the affected areas. In severe cases, symptoms may include: Skin sores. Tissues decaying and dying (gangrene). How is this diagnosed? This condition may be diagnosed based on: Your symptoms and medical history. A physical exam. During the exam, you may be asked to put your hands in cold water to check for a reaction to cold temperature. Tests, such as: Blood tests to check for other diseases or conditions. A test to check the movement of blood through your arteries and veins (vascular ultrasound). A test in which the skin at the base of your fingernail is examined under a microscope (nailfold capillaroscopy). How is this treated? During an episode, you can take actions to help symptoms go away faster. Options include moving your arms around in a windmill pattern, warming your fingers under warm water, or placing your fingers in a warm body fold, such as your armpit. Long-term treatment for this condition often involves making lifestyle changes and taking steps to control your exposure to cold temperature. For more severe cases, medicine (calcium channel blockers) may be used to improve blood circulation. Follow these instructions at home: Avoiding cold temperatures Take these steps to avoid exposure to cold: If possible, stay indoors during cold weather. When you go outside during cold weather, dress in layers and wear mittens, a hat, a scarf, and warm footwear. Wear mittens or gloves when handling ice or frozen food. Use holders for glasses or cans containing  cold drinks. Let warm water run for a while before taking a shower or bath. Warm up the car before driving in cold weather. Lifestyle If possible, avoid stressful and emotional situations. Try  to find ways to manage your stress, such as: Exercise. Yoga. Meditation. Biofeedback. Do not use any products that contain nicotine or tobacco. These products include cigarettes, chewing tobacco, and vaping devices, such as e-cigarettes. If you need help quitting, ask your health care provider. Avoid secondhand smoke. Limit your use of caffeine. Switch to decaffeinated coffee, tea, and soda. Avoid chocolate. Avoid vibrating tools and machinery. General instructions Protect your hands and feet from injuries, cuts, or bruises. Avoid wearing tight rings or wristbands. Wear loose fitting socks and comfortable, roomy shoes. Take over-the-counter and prescription medicines only as told by your health care provider. Where to find support Raynaud's Association: www.raynauds.org Where to find more information [employer] of Arthritis and Musculoskeletal and Skin Diseases: www.niams.http://www.myers.net/ Contact a health care provider if: Your discomfort becomes worse despite lifestyle changes. You develop sores on your fingers or toes that do not heal. You have breaks in the skin on your fingers or toes. You have a fever. You have pain or swelling in your joints. You have a rash. Your symptoms occur on only one side of your body. Get help right away if: Your fingers or toes turn black. You have severe pain in the affected areas. These symptoms may represent a serious problem that is an emergency. Do not wait to see if the symptoms will go away. Get medical help right away. Call your local emergency services (911 in the [place] Do not drive yourself to the hospital. Summary Raynaud's phenomenon is a condition that affects the arteries that carry blood to the fingers, toes, ears, lips, nipples, or the tip of the nose. In many cases, the cause of this condition is not known. Symptoms of this condition include changes in skin color along with numbness and tingling in the affected area. Treatment for  this condition includes lifestyle changes and reducing exposure to cold temperatures. Medicines may be used for severe cases of the condition. Contact your health care provider if your condition worsens despite treatment. This information is not intended to replace advice given to you by your health care provider. Make sure you discuss any questions you have with your health care provider. Document Revised: 12/02/2020 Document Reviewed: 12/02/2020 Elsevier Patient Education  2024 [employer]   Hand Exercises Hand exercises can be helpful for almost anyone. They can strengthen your hands and improve flexibility and movement. The exercises can also increase blood flow to the hands. These results can make your work and daily tasks easier for you. Hand exercises can be especially helpful for people who have joint pain from arthritis or nerve damage from using their hands over and over. These exercises can also help people who injure a hand. Exercises Most of these hand exercises are gentle stretching and motion exercises. It is usually safe to do them often throughout the day. Warming up your hands before exercise may help reduce stiffness. You can do this with gentle massage or by placing your hands in warm water for 10-15 minutes. It is normal to feel some stretching, pulling, tightness, or mild discomfort when you begin new exercises. In time, this will improve. Remember to always be careful and stop right away if you feel sudden, very bad pain or your pain gets worse. You want to get better and be safe. Ask your  health care provider which exercises are safe for you. Do exercises exactly as told by your provider and adjust them as told. Do not begin these exercises until told by your provider. Knuckle bend or "claw" fist  Stand or sit with your arm, hand, and all five fingers pointed straight up. Make sure to keep your wrist straight. Gently bend your fingers down toward your palm until the tips  of your fingers are touching your palm. Keep your big knuckle straight and only bend the small knuckles in your fingers. Hold this position for 10 seconds. Straighten your fingers back to your starting position. Repeat this exercise 5-10 times with each hand. Full finger fist  Stand or sit with your arm, hand, and all five fingers pointed straight up. Make sure to keep your wrist straight. Gently bend your fingers into your palm until the tips of your fingers are touching the middle of your palm. Hold this position for 10 seconds. Extend your fingers back to your starting position, stretching every joint fully. Repeat this exercise 5-10 times with each hand. Straight fist  Stand or sit with your arm, hand, and all five fingers pointed straight up. Make sure to keep your wrist straight. Gently bend your fingers at the big knuckle, where your fingers meet your hand, and at the middle knuckle. Keep the knuckle at the tips of your fingers straight and try to touch the bottom of your palm. Hold this position for 10 seconds. Extend your fingers back to your starting position, stretching every joint fully. Repeat this exercise 5-10 times with each hand. Tabletop  Stand or sit with your arm, hand, and all five fingers pointed straight up. Make sure to keep your wrist straight. Gently bend your fingers at the big knuckle, where your fingers meet your hand, as far down as you can. Keep the small knuckles in your fingers straight. Think of forming a tabletop with your fingers. Hold this position for 10 seconds. Extend your fingers back to your starting position, stretching every joint fully. Repeat this exercise 5-10 times with each hand. Finger spread  Place your hand flat on a table with your palm facing down. Make sure your wrist stays straight. Spread your fingers and thumb apart from each other as far as you can until you feel a gentle stretch. Hold this position for 10 seconds. Bring your  fingers and thumb tight together again. Hold this position for 10 seconds. Repeat this exercise 5-10 times with each hand. Making circles  Stand or sit with your arm, hand, and all five fingers pointed straight up. Make sure to keep your wrist straight. Make a circle by touching the tip of your thumb to the tip of your index finger. Hold for 10 seconds. Then open your hand wide. Repeat this motion with your thumb and each of your fingers. Repeat this exercise 5-10 times with each hand. Thumb motion  Sit with your forearm resting on a table and your wrist straight. Your thumb should be facing up toward the ceiling. Keep your fingers relaxed as you move your thumb. Lift your thumb up as high as you can toward the ceiling. Hold for 10 seconds. Bend your thumb across your palm as far as you can, reaching the tip of your thumb for the small finger (pinkie) side of your palm. Hold for 10 seconds. Repeat this exercise 5-10 times with each hand. Grip strengthening  Hold a stress ball or other soft ball in the middle of your  hand. Slowly increase the pressure, squeezing the ball as much as you can without causing pain. Think of bringing the tips of your fingers into the middle of your palm. All of your finger joints should bend when doing this exercise. Hold your squeeze for 10 seconds, then relax. Repeat this exercise 5-10 times with each hand. Contact a health care provider if: Your hand pain or discomfort gets much worse when you do an exercise. Your hand pain or discomfort does not improve within 2 hours after you exercise. If you have either of these problems, stop doing these exercises right away. Do not do them again unless your provider says that you can. Get help right away if: You develop sudden, severe hand pain or swelling. If this happens, stop doing these exercises right away. Do not do them again unless your provider says that you can. This information is not intended to replace  advice given to you by your health care provider. Make sure you discuss any questions you have with your health care provider. Document Revised: 10/12/2022 Document Reviewed: 10/12/2022 Elsevier Patient Education  2024 [employer].

## 2023-09-22 DIAGNOSIS — N958 Other specified menopausal and perimenopausal disorders: Secondary | ICD-10-CM | POA: Diagnosis not present

## 2023-09-22 DIAGNOSIS — N3946 Mixed incontinence: Secondary | ICD-10-CM | POA: Diagnosis not present

## 2023-09-23 ENCOUNTER — Other Ambulatory Visit (HOSPITAL_BASED_OUTPATIENT_CLINIC_OR_DEPARTMENT_OTHER): Payer: Self-pay

## 2023-09-23 ENCOUNTER — Other Ambulatory Visit: Payer: Self-pay

## 2023-09-23 ENCOUNTER — Ambulatory Visit (INDEPENDENT_AMBULATORY_CARE_PROVIDER_SITE_OTHER): Payer: HMO | Admitting: Internal Medicine

## 2023-09-23 ENCOUNTER — Encounter: Payer: Self-pay | Admitting: Internal Medicine

## 2023-09-23 ENCOUNTER — Ambulatory Visit: Payer: HMO | Admitting: Internal Medicine

## 2023-09-23 VITALS — BP 130/72 | HR 87 | Temp 98.1°F | Ht 62.5 in | Wt 135.8 lb

## 2023-09-23 DIAGNOSIS — R251 Tremor, unspecified: Secondary | ICD-10-CM

## 2023-09-23 DIAGNOSIS — N390 Urinary tract infection, site not specified: Secondary | ICD-10-CM

## 2023-09-23 DIAGNOSIS — Z7985 Long-term (current) use of injectable non-insulin antidiabetic drugs: Secondary | ICD-10-CM | POA: Diagnosis not present

## 2023-09-23 DIAGNOSIS — E039 Hypothyroidism, unspecified: Secondary | ICD-10-CM | POA: Diagnosis not present

## 2023-09-23 DIAGNOSIS — E1122 Type 2 diabetes mellitus with diabetic chronic kidney disease: Secondary | ICD-10-CM

## 2023-09-23 DIAGNOSIS — E875 Hyperkalemia: Secondary | ICD-10-CM | POA: Diagnosis not present

## 2023-09-23 DIAGNOSIS — I7300 Raynaud's syndrome without gangrene: Secondary | ICD-10-CM

## 2023-09-23 DIAGNOSIS — N3946 Mixed incontinence: Secondary | ICD-10-CM | POA: Diagnosis not present

## 2023-09-23 DIAGNOSIS — N200 Calculus of kidney: Secondary | ICD-10-CM | POA: Diagnosis not present

## 2023-09-23 DIAGNOSIS — R319 Hematuria, unspecified: Secondary | ICD-10-CM | POA: Diagnosis not present

## 2023-09-23 DIAGNOSIS — N1832 Chronic kidney disease, stage 3b: Secondary | ICD-10-CM

## 2023-09-23 DIAGNOSIS — R7989 Other specified abnormal findings of blood chemistry: Secondary | ICD-10-CM

## 2023-09-23 DIAGNOSIS — N1831 Chronic kidney disease, stage 3a: Secondary | ICD-10-CM | POA: Diagnosis not present

## 2023-09-23 DIAGNOSIS — N393 Stress incontinence (female) (male): Secondary | ICD-10-CM | POA: Diagnosis not present

## 2023-09-23 DIAGNOSIS — N281 Cyst of kidney, acquired: Secondary | ICD-10-CM | POA: Diagnosis not present

## 2023-09-23 MED ORDER — TIRZEPATIDE 5 MG/0.5ML ~~LOC~~ SOAJ
5.0000 mg | SUBCUTANEOUS | 2 refills | Status: DC
  Filled 2023-09-23: qty 6, 84d supply, fill #0
  Filled 2023-12-20: qty 2, 28d supply, fill #1

## 2023-09-24 NOTE — Assessment & Plan Note (Signed)
Affecting fingers with color changes and numbness No digital ulcers Plan: Conservative management with heated gloves Education on cold avoidance Consider USB-charged heated gloves for car Monitor for complications

## 2023-09-24 NOTE — Assessment & Plan Note (Signed)
Currently completing course of Macrobid Recent urine culture collected today Plan: Complete current antibiotic course Consider prophylactic antibiotics if infections continue Monitor monthly urinalysis Avoid bladder irritants

## 2023-09-24 NOTE — Assessment & Plan Note (Signed)
Recent A1c 5.2% showing excellent control No hypoglycemic episodes Plan: Continue Mounjaro, increasing to 5mg  weekly Continue metformin 500mg  BID Weekly glucose monitoring Quarterly A1c checks Annual diabetic eye exam due

## 2023-09-24 NOTE — Patient Instructions (Signed)
#   After Visit Instructions  ## Important Medication Changes  1. Thyroid Medication (Levothyroxine):    - Take FIRST thing in the morning    - Use ONLY water (no food, coffee, or other drinks)    - Wait 30 minutes before taking other medications or eating    - Current dose: daily  2. Diabetes Medication (Mounjaro):    - Increase to 5mg  weekly starting with next Monday's dose    - Continue checking blood sugar weekly    - Call if experiencing severe nausea or vomiting  3. Bladder Medication:    - New prescription for trospium 20mg     - Stop previous VESIcare when starting trospium    - DO NOT take both medications together    - Report any side effects at follow-up  4. Tremor Medication (Primidone):    - Try reducing evening dose as discussed    - Keep morning dose the same (2 tablets)    - Monitor tremor control    - Report any significant changes  ## New Self-Care Instructions  For Raynaud's Symptoms: - Keep gloves in multiple locations (car, jackets, purse) - Consider purchasing USB-charged heated gloves - Avoid cold exposure when possible - Watch for any skin changes or ulcers on fingers  For Kidney Health: - Continue low-salt diet - Avoid NSAIDs (like ibuprofen) - Stay well hydrated - Monitor blood pressure regularly - Report any significant changes in urination  ## Follow-Up Appointments  1. Next Week: Return to clinic for medication review 2. Monday:     - Breast MRI    - Cardiology appointment  ## When to Call  Contact us immediately if you experience: - Severe tremors - Signs of UTI (burning, frequency, fever) - Severe finger color changes or pain - Any concerning medication side effects - Severe nausea or vomiting  ## Remember - Finish current antibiotic course completely - Take morning medications as newly instructed - Bring all current medications to next week's visit - Keep all scheduled specialist appointments  Call the office with any  questions: [Phone Number]

## 2023-09-24 NOTE — Progress Notes (Signed)
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 46.3    Lymphocytes Relative 03/14/2023 46.6 (H)    Monocytes Relative 03/14/2023 5.0    Eosinophils Relative 03/14/2023 1.3    Basophils Relative 03/14/2023 0.8    Neutro Abs 03/14/2023 2.5    Lymphs Abs 03/14/2023 2.5    Monocytes Absolute 03/14/2023 0.3    Eosinophils Absolute 03/14/2023 0.1    Basophils Absolute 03/14/2023 0.0    Sodium 03/14/2023 137    Potassium 03/14/2023 4.2    Chloride 03/14/2023 103    CO2 03/14/2023 25    Glucose, Bld 03/14/2023 78    BUN 03/14/2023 13    Creatinine, Ser 03/14/2023 0.79    Total Bilirubin 03/14/2023 0.2     Alkaline Phosphatase 03/14/2023 74    AST 03/14/2023 25    ALT 03/14/2023 26    Total Protein 03/14/2023 6.5    Albumin 03/14/2023 4.0    GFR 03/14/2023 71.22    Calcium 03/14/2023 8.9    Cholesterol 03/14/2023 177    Triglycerides 03/14/2023 187.0 (H)    HDL 03/14/2023 43.20    VLDL 03/14/2023 37.4    LDL Cholesterol 03/14/2023 96    Total CHOL/HDL Ratio 03/14/2023 4    NonHDL 03/14/2023 133.65    TSH W/REFLEX TO FT4 03/14/2023 0.81    VITD 03/14/2023 40.43    Iron 03/14/2023 65    TIBC 03/14/2023 340    %SAT 03/14/2023 19    Ferritin 03/14/2023 9 (L)    Vitamin B-12 03/14/2023 >1500 (H)    Folate 03/14/2023 3.3 (L)    Uric Acid, Serum 03/14/2023 4.6    Microalb, Ur 03/16/2023 <0.7    Creatinine,U 03/16/2023 64.0    Microalb Creat Ratio 03/16/2023 1.1    Color, Urine 03/16/2023 YELLOW    APPearance 03/16/2023 Cloudy (A)    Specific Gravity, Urine 03/16/2023 1.010    pH 03/16/2023 7.0    Total Protein, Urine 03/16/2023 NEGATIVE    Urine Glucose 03/16/2023 >=1000 (A)    Ketones, ur 03/16/2023 NEGATIVE    Bilirubin Urine 03/16/2023 NEGATIVE    Hgb urine dipstick 03/16/2023 NEGATIVE    Urobilinogen, UA 03/16/2023 0.2    Leukocytes,Ua 03/16/2023 MODERATE (A)    Nitrite 03/16/2023 NEGATIVE    WBC, UA 03/16/2023 21-50/hpf (A)    RBC / HPF 03/16/2023 0-2/hpf    Squamous Epithelial / HPF 03/16/2023 Rare(0-4/hpf)    Bacteria, UA 03/16/2023 Many(>50/hpf) (A)   Office Visit on 12/14/2022  Component Date Value   Color, Urine 12/14/2022 YELLOW    APPearance 12/14/2022 Cloudy (A)    Specific Gravity, Urine 12/14/2022 1.020    pH 12/14/2022 6.0    Total Protein, Urine 12/14/2022 NEGATIVE    Urine Glucose 12/14/2022 >=1000 (A)    Ketones, ur 12/14/2022 NEGATIVE    Bilirubin Urine 12/14/2022 NEGATIVE    Hgb urine dipstick 12/14/2022 NEGATIVE    Urobilinogen, UA 12/14/2022 0.2  Leukocytes,Ua 12/14/2022 SMALL (A)    Nitrite 12/14/2022 POSITIVE (A)    WBC, UA 12/14/2022  TNTC(>50/hpf) (A)    RBC / HPF 12/14/2022 0-2/hpf    Squamous Epithelial / HPF 12/14/2022 Rare(0-4/hpf)    Bacteria, UA 12/14/2022 Many(>50/hpf) (A)    WBC 12/14/2022 5.6    RBC 12/14/2022 4.69    Platelets 12/14/2022 183.0    Hemoglobin 12/14/2022 13.7    HCT 12/14/2022 41.4    MCV 12/14/2022 88.2    MCHC 12/14/2022 33.0    RDW 12/14/2022 17.7 (H)    Sodium 12/14/2022 141    Potassium 12/14/2022 4.1    Chloride 12/14/2022 105    CO2 12/14/2022 25    Glucose, Bld 12/14/2022 80    BUN 12/14/2022 16    Creatinine, Ser 12/14/2022 1.00    Total Bilirubin 12/14/2022 0.2    Alkaline Phosphatase 12/14/2022 81    AST 12/14/2022 26    ALT 12/14/2022 26    Total Protein 12/14/2022 7.2    Albumin 12/14/2022 4.4    GFR 12/14/2022 53.77 (L)    Calcium 12/14/2022 9.9    Hgb A1c MFr Bld 12/14/2022 6.2    Cholesterol 12/14/2022 154    Triglycerides 12/14/2022 105.0    HDL 12/14/2022 46.20    VLDL 12/14/2022 21.0    LDL Cholesterol 12/14/2022 86    Total CHOL/HDL Ratio 12/14/2022 3    NonHDL 12/14/2022 107.40    TSH 12/14/2022 1.82    No image results found.   LONG TERM MONITOR (3-14 DAYS) Result Date: 07/01/2023   Patient had a minimum heart rate of 57 bpm, maximum heart rate of 123 bpm, and average heart rate of 75 bpm.   Predominant underlying rhythm was sinus rhythm.   Isolated PACs were rare (<1.0%).   Isolated PVCs were rare (<1.0%).   No triggered events. No malignant arrhythmias.   No results found.    # Time-Based Billing Attestation  I spent 38 minutes of total time personally providing care for this established patient on the date of service, including:   - Comprehensive medication review and adjustment discussions - Detailed patient education regarding medication timing and interactions - Counseling on Raynaud's phenomenon management - Review of specialists' recommendations - Discussion of treatment preferences and shared decision-making - Review of recent nephrology  consultation - Analysis of laboratory trends - Medication reconciliation and prescription modifications - Documentation - Care coordination for upcoming specialist visits  Medical Necessity for Extended Time: 1. Multiple chronic conditions requiring careful medication adjustment:    - CKD Stage 3b    - Type 2 Diabetes    - Hypothyroidism    - Essential Tremor    - Recurrent UTIs     2. Complex medication management:    - Multiple medication changes requiring detailed instructions    - Drug interaction considerations    - Need to minimize medication burden while maintaining disease control  3. Coordination of care:    - Recent nephrology consultation    - Recent urology consultation    - Pending cardiology evaluation    - Multiple medication adjustments requiring monitoring  4. Patient safety considerations:    - Multiple medication changes requiring detailed instructions    - New symptom evaluation (Raynaud's)    - CKD requiring medication adjustment considerations  This visit meets criteria for 16109 based on total time of 38 minutes spent on the date of service.   Additional Info: This encounter employed real-time, collaborative documentation. The patient  actively reviewed and updated their medical record on a shared screen, ensuring transparency and facilitating joint problem-solving for the problem list, overview, and plan. This approach promotes accurate, informed care. The treatment plan was discussed and reviewed in detail, including medication safety, potential side effects, and all patient questions. We confirmed understanding and comfort with the plan. Follow-up instructions were established, including contacting the office for any concerns, returning if symptoms worsen, persist, or new symptoms develop, and precautions for potential emergency department visits.

## 2023-09-24 NOTE — Assessment & Plan Note (Signed)
Currently managed with combination therapy Patient interested in medication reduction Plan: Continue propranolol 10mg  BID Trial reducing primidone to 50mg  BID (from current 50mg  two AM, four PM) Monitor functional status and tremor control Follow up in 1 week to assess response

## 2023-09-24 NOTE — Assessment & Plan Note (Addendum)
1. Chronic Kidney Disease Stage 3b (N18.32) - Stable  Current GFR 49.91 mL/min (declining trend) Electrolytes stable with normal potassium Risk factors: diabetes, recurrent UTIs Plan: Continue nephrology follow-up as scheduled Monthly metabolic panel monitoring Avoid nephrotoxic medications Patient education provided regarding diet restrictions and fluid management Continue ACE/ARB discussion at next visit

## 2023-09-24 NOTE — Assessment & Plan Note (Signed)
TSH trending low at 0.18 mIU/L with normal free T4 Current medication administration suboptimal Plan: Decrease levothyroxine to daily New instructions: take with water only, 30 minutes before food/medications Recheck thyroid function in 6 weeks Monitor for symptoms of thyroid dysfunction

## 2023-09-25 ENCOUNTER — Encounter: Payer: Self-pay | Admitting: Internal Medicine

## 2023-09-25 ENCOUNTER — Other Ambulatory Visit: Payer: Self-pay | Admitting: Internal Medicine

## 2023-09-25 DIAGNOSIS — G8929 Other chronic pain: Secondary | ICD-10-CM | POA: Insufficient documentation

## 2023-09-25 DIAGNOSIS — M545 Low back pain, unspecified: Secondary | ICD-10-CM

## 2023-09-25 DIAGNOSIS — Z87828 Personal history of other (healed) physical injury and trauma: Secondary | ICD-10-CM

## 2023-09-25 DIAGNOSIS — M4856XD Collapsed vertebra, not elsewhere classified, lumbar region, subsequent encounter for fracture with routine healing: Secondary | ICD-10-CM | POA: Insufficient documentation

## 2023-09-25 DIAGNOSIS — M439 Deforming dorsopathy, unspecified: Secondary | ICD-10-CM

## 2023-09-25 DIAGNOSIS — I700 Atherosclerosis of aorta: Secondary | ICD-10-CM | POA: Insufficient documentation

## 2023-09-25 DIAGNOSIS — S32010A Wedge compression fracture of first lumbar vertebra, initial encounter for closed fracture: Secondary | ICD-10-CM | POA: Insufficient documentation

## 2023-09-25 NOTE — Progress Notes (Signed)
Reviewed CT renal stone study (09/25/2023). No renal, ureteral or bladder calculi. No hydronephrosis. Incidental finding of L1 superior endplate compression deformity with 5mm bony retropulsion requires spine MRI for further evaluation. Moderate-sized hiatal hernia and aortic atherosclerosis (currently untreated) noted. Detailed explanation provided in patient message.

## 2023-09-26 ENCOUNTER — Other Ambulatory Visit: Payer: HMO

## 2023-09-26 ENCOUNTER — Other Ambulatory Visit: Payer: Self-pay

## 2023-09-26 ENCOUNTER — Ambulatory Visit (HOSPITAL_BASED_OUTPATIENT_CLINIC_OR_DEPARTMENT_OTHER): Payer: HMO | Admitting: Cardiology

## 2023-09-26 ENCOUNTER — Ambulatory Visit
Admission: RE | Admit: 2023-09-26 | Discharge: 2023-09-26 | Disposition: A | Discharge status: Home / Self Care | Payer: HMO | Source: Ambulatory Visit | Attending: Internal Medicine

## 2023-09-26 ENCOUNTER — Encounter (HOSPITAL_BASED_OUTPATIENT_CLINIC_OR_DEPARTMENT_OTHER): Payer: Self-pay | Admitting: Cardiology

## 2023-09-26 VITALS — BP 116/64 | HR 83 | Ht 62.0 in | Wt 132.7 lb

## 2023-09-26 DIAGNOSIS — Z87828 Personal history of other (healed) physical injury and trauma: Secondary | ICD-10-CM

## 2023-09-26 DIAGNOSIS — Z7189 Other specified counseling: Secondary | ICD-10-CM | POA: Diagnosis not present

## 2023-09-26 DIAGNOSIS — I700 Atherosclerosis of aorta: Secondary | ICD-10-CM

## 2023-09-26 DIAGNOSIS — Z1239 Encounter for other screening for malignant neoplasm of breast: Secondary | ICD-10-CM | POA: Diagnosis not present

## 2023-09-26 DIAGNOSIS — E782 Mixed hyperlipidemia: Secondary | ICD-10-CM | POA: Diagnosis not present

## 2023-09-26 DIAGNOSIS — Z803 Family history of malignant neoplasm of breast: Secondary | ICD-10-CM

## 2023-09-26 DIAGNOSIS — R296 Repeated falls: Secondary | ICD-10-CM | POA: Diagnosis not present

## 2023-09-26 DIAGNOSIS — M439 Deforming dorsopathy, unspecified: Secondary | ICD-10-CM

## 2023-09-26 DIAGNOSIS — M545 Low back pain, unspecified: Secondary | ICD-10-CM

## 2023-09-26 DIAGNOSIS — I7300 Raynaud's syndrome without gangrene: Secondary | ICD-10-CM

## 2023-09-26 DIAGNOSIS — R7989 Other specified abnormal findings of blood chemistry: Secondary | ICD-10-CM | POA: Diagnosis not present

## 2023-09-26 DIAGNOSIS — E875 Hyperkalemia: Secondary | ICD-10-CM | POA: Diagnosis not present

## 2023-09-26 DIAGNOSIS — R682 Dry mouth, unspecified: Secondary | ICD-10-CM

## 2023-09-26 DIAGNOSIS — R9230 Dense breasts, unspecified: Secondary | ICD-10-CM

## 2023-09-26 MED ORDER — GADOPICLENOL 0.5 MMOL/ML IV SOLN
6.0000 mL | Freq: Once | INTRAVENOUS | Status: AC | PRN
  Administered 2023-09-26: 6 mL via INTRAVENOUS

## 2023-09-26 NOTE — Telephone Encounter (Signed)
Pt called about the MRI below and wanted to know if it can be the whole back because she has pain from upper back to lower back. Please advise.

## 2023-09-26 NOTE — Progress Notes (Signed)
Cardiology Office Note:  .   Date:  09/26/2023  ID:  Laura Farrell, DOB Jul 20, 1946, MRN [id number] PCP: Lula Olszewski, MD  [facility] Providers Cardiologist:  Jodelle Red, MD {  History of Present Illness: .   Laura Farrell is a 77 y.o. female with PMH OSA, aortic atherosclerosis, type II diabetes, chronic kidney disease stage 3a who was referred by Dr. Jon Billings for syncope.  Today: Referral note from 06/08/23 reviewed. Seen for UTI in the setting of SGLT2i. Noted two falls in a three week time period. Unclear if mechanical falls or syncope. Referred to cardiology for further evaluation.  She feels that her feet got tangled up during these episodes. 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 She does not recall losing consciousness, got right back up, no confusion. No more episodes since that time.   Had a monitor done, reviewed today.   She is still on medication for UTI, thinks this may be chronic now for her.   Asking about lp(a) today. Reports that she was on atorvastatin previously for high cholesterol, she has since been taken off of this to try to simplify her regimen--no side effects noted. Has been on propranolol "for fifty years." She takes this for tremor. She has been on mounjaro for about 6-7 months. Was off of this for a time when she was ill several months ago. Tolerating 2.5 mg dose currently.  Family history: first cousin on her father's side has hypertrophic cardiomyopathy (but likely through the non-blood related side). No other heart disease that she knows of.   ROS positive for chronic fatigue.  ROS: Denies chest pain, shortness of breath at rest or with normal exertion. No PND, orthopnea, LE edema or unexpected weight gain. No syncope or palpitations. ROS otherwise negative except as noted.   Studies Reviewed: Marland Kitchen    EKG:       Monitor reviewed:  Patient  had a minimum heart rate of 57 bpm, maximum heart rate of 123 bpm, and average heart rate of 75 bpm.   Predominant underlying rhythm was sinus rhythm.   Isolated PACs were rare (<1.0%).   Isolated PVCs were rare (<1.0%).   No triggered events.   No malignant arrhythmias.  Physical Exam:   VS:  BP 116/64 Comment: Right arm 120/80  Pulse 83   Ht 5\' 2"  (1.575 m)   Wt 132 lb 11.2 oz (60.2 kg)   SpO2 97%   BMI 24.27 kg/m    Wt Readings from Last 3 Encounters:  09/26/23 132 lb 11.2 oz (60.2 kg)  09/23/23 135 lb 12.8 oz (61.6 kg)  09/21/23 134 lb 9.6 oz (61.1 kg)    GEN: Well nourished, well developed in no acute distress HEENT: Normal, moist mucous membranes NECK: No JVD CARDIAC: regular rhythm, normal S1 and S2, no rubs or gallops. No murmur. VASCULAR: Radial and DP pulses 2+ bilaterally. No carotid bruits RESPIRATORY:  Clear to auscultation without rales, wheezing or rhonchi  ABDOMEN: Soft, non-tender, non-distended MUSCULOSKELETAL:  Ambulates independently SKIN: Warm and dry, no edema NEUROLOGIC:  Alert and oriented x 3. No focal neuro deficits noted. PSYCHIATRIC:  Normal affect    ASSESSMENT AND PLAN: .    Falls -not consistent with syncope -monitor unremarkable -no recurrence -no further evaluation indicated at this time  Mixed hyperlipidemia Aortic atherosclerosis -lipids from 03/2023 reviewed. TG 187, LDL 96 -was on atorvastatin  for years, stopped with recent GI illness. Recommend restarting statin, AST/ALT normal 08/2023. She will discuss with Dr. Jon Billings -discussed lp(a). Given her age, lack of family history, and lack of treatment options at this time, would hold on testing at this time.  CV risk counseling and prevention -recommend heart healthy/Mediterranean diet, with whole grains, fruits, vegetable, fish, lean meats, nuts, and olive oil. Limit salt. -recommend moderate walking, 3-5 times/week for 30-50 minutes each session. Aim for at least 150 minutes.week.  Goal should be pace of 3 miles/hours, or walking 1.5 miles in 30 minutes -recommend avoidance of tobacco products. Avoid excess alcohol.  Dispo: I would be happy to see her back as needed  Signed, Jodelle Red, MD   Jodelle Red, MD, PhD, [facility]  [facility]  [place]  [facility] at [facility] at [facility] [address] [place], [place] [address] [phone]

## 2023-09-26 NOTE — Telephone Encounter (Signed)
There is no MRI that is for the entire back per Dr. Jon Billings. Ordered three separate MRI's that would cover her entire back.

## 2023-09-26 NOTE — Telephone Encounter (Signed)
Informed patient via mychart message.

## 2023-09-26 NOTE — Patient Instructions (Signed)
Medication Instructions:  ?Your physician recommends that you continue on your current medications as directed. Please refer to the Current Medication list given to you today.  ? ?Labwork: ?NONE ? ?Testing/Procedures: ?NONE ? ?Follow-Up: ?AS NEEDED  ? ?  ?

## 2023-09-27 ENCOUNTER — Other Ambulatory Visit: Payer: Self-pay | Admitting: Internal Medicine

## 2023-09-27 ENCOUNTER — Ambulatory Visit
Admission: RE | Admit: 2023-09-27 | Discharge: 2023-09-27 | Disposition: A | Discharge status: Home / Self Care | Payer: HMO | Source: Ambulatory Visit | Attending: Internal Medicine

## 2023-09-27 ENCOUNTER — Ambulatory Visit
Admission: RE | Admit: 2023-09-27 | Discharge: 2023-09-27 | Disposition: A | Discharge status: Home / Self Care | Payer: HMO | Source: Ambulatory Visit | Attending: Internal Medicine | Admitting: Internal Medicine

## 2023-09-27 DIAGNOSIS — M439 Deforming dorsopathy, unspecified: Secondary | ICD-10-CM

## 2023-09-27 DIAGNOSIS — Z87828 Personal history of other (healed) physical injury and trauma: Secondary | ICD-10-CM

## 2023-09-27 DIAGNOSIS — M546 Pain in thoracic spine: Secondary | ICD-10-CM | POA: Diagnosis not present

## 2023-09-27 DIAGNOSIS — M545 Low back pain, unspecified: Secondary | ICD-10-CM

## 2023-09-27 DIAGNOSIS — S32010A Wedge compression fracture of first lumbar vertebra, initial encounter for closed fracture: Secondary | ICD-10-CM | POA: Diagnosis not present

## 2023-09-27 DIAGNOSIS — M47816 Spondylosis without myelopathy or radiculopathy, lumbar region: Secondary | ICD-10-CM | POA: Diagnosis not present

## 2023-09-27 DIAGNOSIS — M542 Cervicalgia: Secondary | ICD-10-CM | POA: Diagnosis not present

## 2023-09-27 LAB — COMPREHENSIVE METABOLIC PANEL
AG Ratio: 1.4 (calc) (ref 1.0–2.5)
ALT: 15 U/L (ref 6–29)
AST: 17 U/L (ref 10–35)
Albumin: 4.1 g/dL (ref 3.6–5.1)
Alkaline phosphatase (APISO): 86 U/L (ref 37–153)
BUN: 16 mg/dL (ref 7–25)
CO2: 22 mmol/L (ref 20–32)
Calcium: 9.6 mg/dL (ref 8.6–10.4)
Chloride: 104 mmol/L (ref 98–110)
Creat: 0.92 mg/dL (ref 0.60–1.00)
Globulin: 3 g/dL (ref 1.9–3.7)
Glucose, Bld: 101 mg/dL — ABNORMAL HIGH (ref 65–99)
Potassium: 4.3 mmol/L (ref 3.5–5.3)
Sodium: 140 mmol/L (ref 135–146)
Total Bilirubin: 0.2 mg/dL (ref 0.2–1.2)
Total Protein: 7.1 g/dL (ref 6.1–8.1)

## 2023-09-27 LAB — CK: Total CK: 28 U/L — ABNORMAL LOW (ref 29–143)

## 2023-09-27 LAB — CARDIOLIPIN ANTIBODIES, IGG, IGM, IGA
Anticardiolipin IgA: <2 [APL'U]/mL (ref <20.0)
Anticardiolipin IgG: <2 [GPL'U]/mL (ref <20.0)
Anticardiolipin IgM: 4.1 [MPL'U]/mL (ref <20.0)

## 2023-09-27 LAB — BETA-2 GLYCOPROTEIN ANTIBODIES
Beta-2 Glyco 1 IgA: <2 U/mL (ref <20.0)
Beta-2 Glyco 1 IgM: 2.4 U/mL (ref <20.0)
Beta-2 Glyco I IgG: <2 U/mL (ref <20.0)

## 2023-09-27 LAB — CRYOGLOBULIN: Cryoglobulin, Qualitative Analysis: NOT DETECTED

## 2023-09-27 LAB — TSH+FREE T4: TSH W/REFLEX TO FT4: 3.36 m[IU]/L (ref 0.40–4.50)

## 2023-09-28 ENCOUNTER — Inpatient Hospital Stay: Admission: RE | Admit: 2023-09-28 | Payer: HMO | Source: Ambulatory Visit

## 2023-09-28 NOTE — Progress Notes (Signed)
CK normal, beta-2 GP 1 negative, anticardiolipin negative, cryoglobulins negative.  I will discuss results at the follow-up visit.

## 2023-09-29 ENCOUNTER — Ambulatory Visit: Payer: HMO | Admitting: Internal Medicine

## 2023-09-29 ENCOUNTER — Ambulatory Visit (INDEPENDENT_AMBULATORY_CARE_PROVIDER_SITE_OTHER): Payer: HMO | Admitting: Internal Medicine

## 2023-09-29 ENCOUNTER — Encounter: Payer: Self-pay | Admitting: Internal Medicine

## 2023-09-29 VITALS — BP 120/68 | Wt 133.6 lb

## 2023-09-29 DIAGNOSIS — S32010A Wedge compression fracture of first lumbar vertebra, initial encounter for closed fracture: Secondary | ICD-10-CM

## 2023-09-29 DIAGNOSIS — Z87310 Personal history of (healed) osteoporosis fracture: Secondary | ICD-10-CM | POA: Insufficient documentation

## 2023-09-29 DIAGNOSIS — Z87311 Personal history of (healed) other pathological fracture: Secondary | ICD-10-CM | POA: Insufficient documentation

## 2023-09-29 DIAGNOSIS — E785 Hyperlipidemia, unspecified: Secondary | ICD-10-CM | POA: Diagnosis not present

## 2023-09-29 MED ORDER — CALCITONIN (SALMON) 200 UNIT/ACT NA SOLN: 1.0000 | Freq: Every day | NASAL | 1 refills | Status: DC

## 2023-09-29 MED ORDER — METHOCARBAMOL 500 MG PO TABS: 500.0000 mg | ORAL_TABLET | Freq: Four times a day (QID) | ORAL | 2 refills | Status: DC

## 2023-09-29 MED ORDER — GABAPENTIN 600 MG PO TABS: 600.0000 mg | ORAL_TABLET | Freq: Three times a day (TID) | ORAL | 1 refills | Status: DC

## 2023-09-29 MED ORDER — HYDROCODONE-ACETAMINOPHEN 5-325 MG PO TABS: 1.0000 | ORAL_TABLET | Freq: Four times a day (QID) | ORAL | 0 refills | Status: DC | PRN

## 2023-09-29 NOTE — Patient Instructions (Signed)
VISIT SUMMARY:  During your visit, we discussed your severe left-sided back pain, which has been constant for the past month following a fall. An MRI confirmed a compression fracture at the L1 vertebra. We also reviewed your history of osteoporosis and hyperlipidemia, and discussed your general health maintenance.  YOUR PLAN:  -LUMBAR COMPRESSION FRACTURE (L1): A compression fracture is a type of fracture in the spine where a vertebra collapses. This is likely causing your severe back pain. We will manage this non-surgically with increased gabapentin (600 mg twice daily), hydrocodone for severe pain, and Robaxin. You are also referred to Dr. Lovell Sheehan or a PA for a surgical opinion. Additionally, we will order a bone density test, refer you to physical therapy for aquatic therapy and hyperextension exercises, and advise you to avoid lifting heavy objects and jostling activities.  -OSTEOPOROSIS: Osteoporosis is a condition where bones become weak and brittle. Despite a previous good bone density test, your compression fracture suggests osteoporosis. We will repeat the bone density test and discuss starting a nasal spray for osteoporosis if it is affordable.  -HYPERLIPIDEMIA: Hyperlipidemia is having high levels of fats in the blood, which can increase the risk of heart disease. You are advised to resume taking atorvastatin as recommended by your cardiologist.  -GENERAL HEALTH MAINTENANCE: You are generally in good health but need to increase your vitamin D intake. Continue your current medications, including calcium, folic acid, Neurontin, Synthroid, multivitamin, Macrobid, Fibercon, Actindarol, Sulfoam, VESIcare, Mounjaro, estrogen cream, primidone, and B12. Choose between VESIcare and trospium for bladder management, and repeat the bone density study.  INSTRUCTIONS:  Please schedule a follow-up appointment after Christmas, likely on December 26th.

## 2023-09-29 NOTE — Assessment & Plan Note (Signed)
Lumbar Compression Fracture (L1) They have a newly diagnosed L1 compression fracture without spinal cord impingement, likely resulting from a fall, with severe, stable pain localized to the left side. An MRI confirmed the stable fracture. We discussed non-surgical management and pain control, deciding to refer them to Dr. Lovell Sheehan or a PA for a surgical opinion, increase gabapentin to 600 mg twice daily, and prescribe hydrocodone (15-20 tablets) for severe pain and Robaxin. We will order a bone density test, refer them to physical therapy for aquatic therapy and hyperextension exercises, and advise against lifting heavy objects and jostling activities.  Key Current Issues: 1. L1 compression fracture (likely from fall ~1 month ago) - Severe left flank pain - Able to ambulate but with severe pain - No cord impingement - Pain stable (not worse/better) for past week  Relevant Medical History: - Chronic kidney disease stage 3a/3b - Type 2 diabetes - History of back surgery - Chronic back pain - Osteoarthritis - Osteoporosis screening indicated  Current Pain Management: - Currently on gabapentin 300mg  three times daily will increase to 600  Important Considerations: 1. Pain Management Options: - Need to be careful with NSAIDs due to CKD - Opioids may be necessary short-term but use caution given age - Could consider increasing gabapentin if no side effects - Possible role for muscle relaxants  2. Fracture Management: - Considered bracing, data doesn't support this a month out. - Need bone health workup but 2022 neg - Physical therapy when appropriate discussed aquatherapy at [facility] - Bone density scan repeat since 2022 inconsistent with fracture(s)  3. Risk Factors for Poor Healing: - Age - Possible metabolic bone disease - CKD - Type 2 diabetes

## 2023-09-29 NOTE — Progress Notes (Signed)
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 12/14/2022 88.2    MCHC 12/14/2022 33.0    RDW 12/14/2022 17.7 (H)    Sodium 12/14/2022 141    Potassium 12/14/2022 4.1    Chloride 12/14/2022 105    CO2 12/14/2022 25    Glucose, Bld 12/14/2022 80    BUN 12/14/2022 16    Creatinine, Ser 12/14/2022 1.00    Total Bilirubin 12/14/2022 0.2    Alkaline Phosphatase 12/14/2022 81    AST 12/14/2022 26    ALT 12/14/2022 26    Total Protein 12/14/2022 7.2    Albumin 12/14/2022 4.4    GFR 12/14/2022 53.77 (L)    Calcium 12/14/2022 9.9    Hgb A1c MFr Bld 12/14/2022 6.2    Cholesterol 12/14/2022 154    Triglycerides 12/14/2022 105.0    HDL 12/14/2022 46.20    VLDL 12/14/2022 21.0    LDL Cholesterol 12/14/2022 86    Total CHOL/HDL Ratio 12/14/2022 3    NonHDL 12/14/2022 107.40    TSH  12/14/2022 1.82   There may be more visits with results that are not included.  No image results found. MR BREAST BILATERAL W WO CONTRAST INC CAD Result Date: 09/26/2023 CLINICAL DATA:  High-risk screening breast MRI. Strong family history of breast cancer. Patient's sister was diagnosed with breast cancer at the age of 61. History of benign left breast biopsy. EXAM: BILATERAL BREAST MRI WITH AND WITHOUT CONTRAST TECHNIQUE: Multiplanar, multisequence MR images of both breasts were obtained prior to and following the intravenous administration of 6 ml of Vueway Three-dimensional MR images were rendered by post-processing of the original MR data on an independent workstation. The three-dimensional MR images were interpreted, and findings are reported in the following complete MRI report for this study. Three dimensional images were evaluated at the independent interpreting workstation using the DynaCAD thin client. COMPARISON:  Previous exam(s). FINDINGS: Breast composition: c. Heterogeneous fibroglandular tissue. Background parenchymal enhancement: Minimal Right breast: No mass or abnormal enhancement. Left breast: No mass or abnormal enhancement. Lymph nodes: No abnormal appearing lymph nodes. Ancillary findings:  None. IMPRESSION: No abnormal enhancement in either breast. RECOMMENDATION: Bilateral screening mammogram in August of 2025 is recommended. The [employer] recommends annual MRI and mammography in patients with an estimated lifetime risk of developing breast cancer greater than 20 - 25%, or who are known or suspected to be positive for the breast cancer gene. BI-RADS CATEGORY  1: Negative. Electronically Signed   By: Baird Lyons M.D.   On: 09/26/2023 14:48   CT RENAL STONE STUDY Result Date: 09/25/2023 CLINICAL DATA:  Left flank pain history of renal stones. EXAM: CT ABDOMEN AND PELVIS WITHOUT CONTRAST TECHNIQUE: Multidetector CT imaging of the abdomen and pelvis was performed  following the standard protocol without IV contrast. RADIATION DOSE REDUCTION: This exam was performed according to the departmental dose-optimization program which includes automated exposure control, adjustment of the mA and/or kV according to patient size and/or use of iterative reconstruction technique. COMPARISON:  MRI September 09 2022 and CT November 01, 2013 FINDINGS: Lower chest: No acute abnormality. Hepatobiliary: Unremarkable noncontrast enhanced appearance of the hepatic parenchyma. Gallbladder is unremarkable. No biliary ductal dilation. Pancreas: No pancreatic ductal dilation or evidence of acute inflammation. Spleen: No splenomegaly. Adrenals/Urinary Tract: Bilateral adrenal glands appear normal. Right renal lesions previously characterized as benign cysts on MRI September 09, 2022 and requiring no independent imaging follow-up per that examination. No hydronephrosis. No renal, ureteral or bladder calculi. Urinary bladder is unremarkable for degree of distension. Stomach/Bowel: Moderate-sized hiatal hernia. No pathologic dilation of small or large bowel. No evidence of acute bowel inflammation. Vascular/Lymphatic: Aortic atherosclerosis. Normal caliber abdominal aorta. Smooth IVC contours. No pathologically enlarged abdominal or pelvic lymph nodes. Reproductive: Status post hysterectomy. No adnexal masses. Other: No significant abdominopelvic free fluid. Probable prior hernia repair in the anterior abdominal wall. Surgical clips posterior to the cecum may reflect prior appendectomy. Musculoskeletal: L1 superior endplate irregularity/compression deformity with 5 mm of bony retropulsion into the canal. IMPRESSION: 1. No hydronephrosis. No renal, ureteral or bladder calculi. 2. Age-indeterminate L1 superior endplate irregularity/compression deformity with 5 mm of bony retropulsion into the canal suggest correlation with point tenderness and consider further evaluation by lumbar spine MRI. 3. Moderate-sized  hiatal hernia. 4.  Aortic Atherosclerosis (ICD10-I70.0). Electronically Signed   By: Maudry Mayhew M.D.   On: 09/25/2023 10:24  No results found.    Medical Decision Making: 1 acute complicated injury Prescription drug management     This document was synthesized by artificial intelligence (Abridge) using HIPAA-compliant recording of the clinical interaction;   We discussed the use of AI scribe software for clinical note transcription with the patient, who gave verbal consent to proceed.  Additional Info: This encounter employed state-of-the-art, real-time, collaborative documentation. The patient actively reviewed and assisted in updating their electronic medical record on a shared screen, ensuring transparency and facilitating joint problem-solving for the problem list, overview, and plan. This approach promotes accurate, informed care. The treatment plan was discussed and reviewed in detail, including medication safety, potential side effects, and all patient questions. We confirmed understanding and comfort with the plan. Follow-up instructions were established, including contacting the office for any concerns, returning if symptoms worsen, persist, or new symptoms develop, and precautions for potential emergency department visits.

## 2023-09-30 ENCOUNTER — Ambulatory Visit: Payer: HMO | Admitting: Internal Medicine

## 2023-10-01 ENCOUNTER — Encounter: Payer: Self-pay | Admitting: Internal Medicine

## 2023-10-03 NOTE — Telephone Encounter (Signed)
Mailed to pt

## 2023-10-06 ENCOUNTER — Encounter: Payer: Self-pay | Admitting: Internal Medicine

## 2023-10-06 ENCOUNTER — Telehealth: Payer: Self-pay

## 2023-10-06 ENCOUNTER — Ambulatory Visit: Payer: HMO | Admitting: Internal Medicine

## 2023-10-06 VITALS — BP 122/80 | HR 60 | Temp 97.2°F | Ht 62.0 in | Wt 129.6 lb

## 2023-10-06 DIAGNOSIS — S32010A Wedge compression fracture of first lumbar vertebra, initial encounter for closed fracture: Secondary | ICD-10-CM | POA: Diagnosis not present

## 2023-10-06 DIAGNOSIS — Z87311 Personal history of (healed) other pathological fracture: Secondary | ICD-10-CM

## 2023-10-06 DIAGNOSIS — E782 Mixed hyperlipidemia: Secondary | ICD-10-CM

## 2023-10-06 DIAGNOSIS — N390 Urinary tract infection, site not specified: Secondary | ICD-10-CM | POA: Diagnosis not present

## 2023-10-06 LAB — URINALYSIS, ROUTINE W REFLEX MICROSCOPIC
Bilirubin Urine: NEGATIVE
Ketones, ur: NEGATIVE
Nitrite: NEGATIVE
Specific Gravity, Urine: 1.02 (ref 1.000–1.030)
Urine Glucose: NEGATIVE
Urobilinogen, UA: 0.2 (ref 0.0–1.0)
pH: 6 (ref 5.0–8.0)

## 2023-10-06 MED ORDER — ROMOSOZUMAB-AQQG 105 MG/1.17ML ~~LOC~~ SOSY: 210.0000 mg | PREFILLED_SYRINGE | SUBCUTANEOUS | Status: AC

## 2023-10-06 MED ORDER — ROSUVASTATIN CALCIUM 20 MG PO TABS: 20.0000 mg | ORAL_TABLET | Freq: Every day | ORAL | 3 refills | Status: DC

## 2023-10-06 NOTE — Assessment & Plan Note (Signed)
We are working  on [employer]

## 2023-10-06 NOTE — Telephone Encounter (Signed)
-----   Message from Lula Olszewski sent at 10/01/2023  9:07 PM EST ----- Jodell Cipro, for compression fracture and fragility fracture ----- Message ----- From: Donzetta Starch, CMA Sent: 09/30/2023   4:14 PM EST To: Lula Olszewski, MD  No patient is attached. Which patient? ----- Message ----- From: Lula Olszewski, MD Sent: 09/29/2023   1:08 PM EST To: Donzetta Starch, CMA  See about evenity for this patient. Compression fracture icd 10 from today's visit.

## 2023-10-06 NOTE — Progress Notes (Signed)
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 technique. COMPARISON:  MRI September 09 2022 and CT November 01, 2013 FINDINGS: Lower chest: No acute abnormality. Hepatobiliary: Unremarkable noncontrast enhanced appearance of the hepatic parenchyma. Gallbladder is unremarkable. No biliary ductal dilation. Pancreas: No pancreatic ductal dilation or evidence of acute inflammation. Spleen: No splenomegaly. Adrenals/Urinary Tract: Bilateral adrenal glands appear normal. Right renal lesions previously characterized as benign cysts on MRI September 09, 2022 and requiring no independent imaging follow-up per that examination. No hydronephrosis. No renal, ureteral or bladder calculi. Urinary bladder is unremarkable for degree of distension. Stomach/Bowel: Moderate-sized hiatal hernia. No pathologic dilation of small or large bowel. No  evidence of acute bowel inflammation. Vascular/Lymphatic: Aortic atherosclerosis. Normal caliber abdominal aorta. Smooth IVC contours. No pathologically enlarged abdominal or pelvic lymph nodes. Reproductive: Status post hysterectomy. No adnexal masses. Other: No significant abdominopelvic free fluid. Probable prior hernia repair in the anterior abdominal wall. Surgical clips posterior to the cecum may reflect prior appendectomy. Musculoskeletal: L1 superior endplate irregularity/compression deformity with 5 mm of bony retropulsion into the canal. IMPRESSION: 1. No hydronephrosis. No renal, ureteral or bladder calculi. 2. Age-indeterminate L1 superior endplate irregularity/compression deformity with 5 mm of bony retropulsion into the canal suggest correlation with point tenderness and consider further evaluation by lumbar spine MRI. 3. Moderate-sized hiatal hernia. 4.  Aortic Atherosclerosis (ICD10-I70.0). Electronically Signed   By: Maudry Mayhew M.D.   On: 09/25/2023 10:24  No results found.     Assessment & Plan Recurrent UTI (urinary tract infection) Recurrent Urinary Tract Infection (UTI)   She has a history of recurrent UTIs, likely caused by a resistant E. coli strain, with multiple antibiotics leading to resistance but no current symptoms. A dropped bladder is suspected as the underlying cause. We discussed the futility of repeated antibiotics and the risk  of more dangerous infections, recommending a urology consultation. No antibiotics will be prescribed; instead, we will check a urine sample, monitor urine weekly, and have referred her to urologist Dr. Ky Barban. Recurrent UTI  Mixed hyperlipidemia Hyperlipidemia   Previously on atorvastatin 80 mg, she switched due to side effects and efficacy concerns, with no current GI issues. The new medication is more effective at a lower dose with a better side effect profile. We will restart cholesterol medication and recheck cholesterol in a few  months. Closed compression fracture of body of L1 vertebra (HCC) Compression Fracture   Persistent flank pain in this individual likely stems from a compression fracture. She is currently benefiting from FortiCal nasal spray for healing and pain relief and has been advised to avoid heavy lifting. After discussing the benefits and costs, she agreed to start Evenity, a medication aimed at enhancing bone strength, which is administered monthly at approximately $2000 per injection for a year. We will continue the FortiCal nasal spray, avoid heavy lifting, and have referred her to neurosurgeon Dr. Lovell Sheehan for evaluation and potential bracing. Evenity initiation and a bone density check are planned.  Chronic Pain   Her chronic pain is not significantly relieved by gabapentin 600 mg or methocarbamol 500 mg. She declined physical therapy or additional pain management referrals, advised to take pain medications as needed. We will continue the current pain management regimen. History of fragility fracture We are working  on [employer] During this Encounter:   Orders Placed This Encounter  Procedures   Urine Culture   Urinalysis, Routine w reflex microscopic   Meds ordered this encounter  Medications   rosuvastatin (CRESTOR) 20 MG tablet    Sig: Take 1 tablet (20 mg total) by mouth daily.    Dispense:  90 tablet    Refill:  3   General Health Maintenance   We reviewed vitamins and supplements including Vitamin D, calcium, B12, and multivitamins, finding her compliant with the regimen. We discussed the potential need to boost Vitamin D intake. We will continue the current vitamin regimen and consider boosting Vitamin D intake.  Follow-up   We plan to recheck urine weekly, follow up with urologist Dr. Ky Barban, check bone density, follow up with neurosurgeon Dr. Lovell Sheehan, and recheck cholesterol in a few months.   This document was synthesized by artificial intelligence ([employer]) using  HIPAA-compliant recording of the clinical interaction;   We discussed the use of AI scribe software for clinical note transcription with the patient, who gave verbal consent to proceed.    Additional Info: This encounter employed state-of-the-art, real-time, collaborative documentation. The patient actively reviewed and assisted in updating their electronic medical record on a shared screen, ensuring transparency and facilitating joint problem-solving for the problem list, overview, and plan. This approach promotes accurate, informed care. The treatment plan was discussed and reviewed in detail, including medication safety, potential side effects, and all patient questions. We confirmed understanding and comfort with the plan. Follow-up instructions were established, including contacting the office for any concerns, returning if symptoms worsen, persist, or new symptoms develop, and precautions for potential emergency department visits.

## 2023-10-06 NOTE — Assessment & Plan Note (Signed)
Hyperlipidemia   Previously on atorvastatin 80 mg, she switched due to side effects and efficacy concerns, with no current GI issues. The new medication is more effective at a lower dose with a better side effect profile. We will restart cholesterol medication and recheck cholesterol in a few months.

## 2023-10-06 NOTE — Patient Instructions (Addendum)
VISIT SUMMARY:  During your follow-up visit, we discussed your chronic flank pain, which is likely due to a recent compression fracture in your back. We also reviewed your history of recurrent urinary tract infections, chronic pain management, hypothyroidism, and hyperlipidemia. Additionally, we talked about your vitamin and supplement regimen and potential treatments for improving bone strength.  YOUR PLAN:  -COMPRESSION FRACTURE: A compression fracture is a type of fracture in the spine that can cause severe pain. You are currently using FortiCal nasal spray to aid in healing and pain relief. We discussed and agreed to start Evenity injections to improve bone strength. You should avoid heavy lifting and will be referred to neurosurgeon Dr. Lovell Sheehan for further evaluation and potential bracing. A bone density check is also planned.  -RECURRENT URINARY TRACT INFECTION (UTI): Recurrent UTIs are repeated infections of the urinary tract. You have a history of these infections, likely due to a resistant E. coli strain. We will not prescribe antibiotics at this time but will check a urine sample and monitor your urine weekly. You are referred to urologist Dr. Ky Barban for further evaluation.  -CHRONIC PAIN: Chronic pain is long-lasting pain that can be difficult to manage. Your current medications, gabapentin and methocarbamol, have not significantly relieved your pain. You are advised to continue taking these medications as needed, and no additional pain management referrals were made at this time.  -HYPOTHYROIDISM: Hypothyroidism is a condition where the thyroid gland does not produce enough hormones. Your condition is managed with levothyroxine 88 mcg, and no changes to this medication were discussed. You should continue taking levothyroxine as prescribed.  -HYPERLIPIDEMIA: Hyperlipidemia is having high levels of fats (lipids) in the blood. You previously switched from atorvastatin due to side effects and  are now on a new cholesterol medication that is more effective at a lower dose. We will restart this medication and recheck your cholesterol levels in a few months.  -GENERAL HEALTH MAINTENANCE: We reviewed your vitamins and supplements, including Vitamin D, calcium, B12, and multivitamins, and found you are compliant with your regimen. We discussed the potential need to boost your Vitamin D intake. You should continue your current vitamin regimen and consider increasing your Vitamin D intake.  INSTRUCTIONS:  We plan to recheck your urine weekly, follow up with urologist Dr. Ky Barban about the recurring urinary tract infection (UTI) if cystoscopy should be done, check your bone density, follow up with neurosurgeon Dr. Lovell Sheehan, and recheck your cholesterol in a few months.
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
## 2023-10-06 NOTE — Telephone Encounter (Signed)
Ordered Evenity and PA team should be working on the prior authorization.

## 2023-10-06 NOTE — Assessment & Plan Note (Signed)
Compression Fracture   Persistent flank pain in this individual likely stems from a compression fracture. She is currently benefiting from FortiCal nasal spray for healing and pain relief and has been advised to avoid heavy lifting. After discussing the benefits and costs, she agreed to start Evenity, a medication aimed at enhancing bone strength, which is administered monthly at approximately $2000 per injection for a year. We will continue the FortiCal nasal spray, avoid heavy lifting, and have referred her to neurosurgeon Dr. Lovell Sheehan for evaluation and potential bracing. Evenity initiation and a bone density check are planned.  Chronic Pain   Her chronic pain is not significantly relieved by gabapentin 600 mg or methocarbamol 500 mg. She declined physical therapy or additional pain management referrals, advised to take pain medications as needed. We will continue the current pain management regimen.

## 2023-10-07 ENCOUNTER — Ambulatory Visit: Payer: HMO | Admitting: Internal Medicine

## 2023-10-08 LAB — URINE CULTURE
MICRO NUMBER:: 15890675
SPECIMEN QUALITY:: ADEQUATE

## 2023-10-09 ENCOUNTER — Other Ambulatory Visit: Payer: Self-pay | Admitting: Internal Medicine

## 2023-10-09 ENCOUNTER — Encounter: Payer: Self-pay | Admitting: Internal Medicine

## 2023-10-13 ENCOUNTER — Other Ambulatory Visit (HOSPITAL_COMMUNITY): Payer: Self-pay

## 2023-10-13 ENCOUNTER — Telehealth: Payer: Self-pay

## 2023-10-13 DIAGNOSIS — S32010A Wedge compression fracture of first lumbar vertebra, initial encounter for closed fracture: Secondary | ICD-10-CM

## 2023-10-13 NOTE — Telephone Encounter (Signed)
 Evenity VOB initiated via [email or web address]

## 2023-10-14 ENCOUNTER — Ambulatory Visit (INDEPENDENT_AMBULATORY_CARE_PROVIDER_SITE_OTHER)
Admission: RE | Admit: 2023-10-14 | Discharge: 2023-10-14 | Disposition: A | Discharge status: Home / Self Care | Payer: HMO | Source: Ambulatory Visit | Attending: Internal Medicine

## 2023-10-14 DIAGNOSIS — S32010A Wedge compression fracture of first lumbar vertebra, initial encounter for closed fracture: Secondary | ICD-10-CM

## 2023-10-14 NOTE — Progress Notes (Signed)
 If not seen notify DEXA was better than expected but still shows high fracture risk. FRAX score: 10 year major osteoporotic risk: 17.1%. 10 year hip fracture risk: 3.2%. The thresholds for treatment are 20% and 3%, respectively. We've already initiated evenity  prior authorization and if no luck will try alternative.

## 2023-10-16 ENCOUNTER — Encounter: Payer: Self-pay | Admitting: Internal Medicine

## 2023-10-17 ENCOUNTER — Ambulatory Visit: Payer: HMO | Admitting: Internal Medicine

## 2023-10-17 ENCOUNTER — Encounter: Payer: Self-pay | Admitting: Internal Medicine

## 2023-10-17 ENCOUNTER — Other Ambulatory Visit (HOSPITAL_BASED_OUTPATIENT_CLINIC_OR_DEPARTMENT_OTHER): Payer: Self-pay

## 2023-10-17 ENCOUNTER — Ambulatory Visit (INDEPENDENT_AMBULATORY_CARE_PROVIDER_SITE_OTHER): Payer: HMO | Admitting: Internal Medicine

## 2023-10-17 VITALS — BP 120/74 | HR 67 | Temp 97.0°F | Ht 62.0 in | Wt 132.8 lb

## 2023-10-17 DIAGNOSIS — Z87311 Personal history of (healed) other pathological fracture: Secondary | ICD-10-CM | POA: Diagnosis not present

## 2023-10-17 DIAGNOSIS — R232 Flushing: Secondary | ICD-10-CM

## 2023-10-17 DIAGNOSIS — N390 Urinary tract infection, site not specified: Secondary | ICD-10-CM | POA: Diagnosis not present

## 2023-10-17 DIAGNOSIS — N393 Stress incontinence (female) (male): Secondary | ICD-10-CM | POA: Diagnosis not present

## 2023-10-17 DIAGNOSIS — R8271 Bacteriuria: Secondary | ICD-10-CM

## 2023-10-17 DIAGNOSIS — S32010A Wedge compression fracture of first lumbar vertebra, initial encounter for closed fracture: Secondary | ICD-10-CM

## 2023-10-17 MED ORDER — SOLIFENACIN SUCCINATE 10 MG PO TABS: 10.0000 mg | ORAL_TABLET | Freq: Every day | ORAL | 3 refills | Status: DC

## 2023-10-17 NOTE — Telephone Encounter (Signed)
 Patient's review of imaging results/notes confirmed.

## 2023-10-17 NOTE — Assessment & Plan Note (Signed)
 Osteoporosis   Her DEXA scan indicates no significant bone loss, yet age, weight, and family history necessitate bone-building medication, with a 3.2% risk of hip fracture in the next 10 years. Evenity  is preferred for its efficacy in rebuilding bone, with Reclast  as an alternative if Evenity  proves unaffordable. After a year on Evenity , Prolia will be used to preserve bone density. We will initiate prior authorization for Evenity  through the [employer] portal and consider Reclast  if necessary. She should continue taking 1200 mg of calcium  daily along with the nasal calcitonin spray.

## 2023-10-17 NOTE — Telephone Encounter (Signed)
 Patient was seen for an OV with Dr. Jon Billings today also.

## 2023-10-17 NOTE — Progress Notes (Signed)
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 arthropathy. There is some ligamentum flavum thickening. Minimal narrowing in the lateral recesses again seen and there is also mild right foraminal narrowing. The central canal and left foramen open. L4-5: Very shallow disc bulge and mild-to-moderate facet degenerative change. The central canal is open. Mild foraminal narrowing again seen. L5-S1: Shallow disc bulge and moderate facet arthropathy without stenosis. IMPRESSION: 1. Acute to subacute superior endplate compression fracture of L1 with up to 40% vertebral body height loss. There is some bony retropulsion off the superior aspect of L1 but the central canal and foramina remain open. 2. No change in lumbar spondylosis which is most notable at L3-4 where there is minimal narrowing in the lateral recesses and mild right foraminal narrowing. 3. Negative cervical and thoracic spine MRI scans. 4. Hiatal hernia. Electronically  Signed   By: Debby Prader M.D.   On: 10/15/2023 10:11   MR THORACIC SPINE WO CONTRAST Result Date: 10/15/2023 CLINICAL DATA:  Chronic, diffuse spine pain. EXAM: MRI CERVICAL, THORACIC AND LUMBAR SPINE WITHOUT CONTRAST TECHNIQUE: Multiplanar and multiecho pulse sequences of the cervical spine, to include the craniocervical junction and cervicothoracic junction, and thoracic and lumbar spine, were obtained without intravenous contrast. COMPARISON:  MRI thoracic spine 02/23/2023. MRI lumbar spine 10/06/2021. FINDINGS: MRI CERVICAL SPINE FINDINGS Alignment: Normal. Vertebrae: No fracture, evidence of discitis, or worrisome bone lesion. Small, benign hemangioma in C7 is incidentally noted. Cord: Normal signal throughout. Posterior Fossa, vertebral arteries, paraspinal tissues: Negative. Disc levels: The central spinal canal and neural foramina are widely patent at all levels. Very shallow disc bulging is seen at C5-6 and C6-7. MRI THORACIC SPINE FINDINGS Alignment:  Normal. Vertebrae: No fracture, evidence of discitis, or bone lesion. Cord:  Normal signal throughout. Paraspinal and other soft tissues: Hiatal hernia noted. Disc levels: The central spinal canal and neural foramina are patent at all levels with a shallow disc bulge and small annular fissure at T11-12 noted. MRI LUMBAR SPINE FINDINGS Segmentation:  Standard. Alignment:  Trace degenerative retrolisthesis L3 on L4. Vertebrae: The patient has a superior endplate compression fracture of L1 which is new since the comparison thoracic spine MRI. There is mild marrow edema within the vertebral body. Vertebral body height  loss is estimated at up to 40%. There is some bony retropulsion as described below. No other fracture is identified. No worrisome marrow lesion or evidence of discitis. Conus medullaris and cauda equina: Conus extends to the L1-2 level. Conus and cauda equina appear normal. Paraspinal and other soft tissues: Right renal cysts incidentally  noted Disc levels: T12-L1: Bony retropulsion centrally and eccentric to the right off of the superior aspect of L1 is present but the central canal and foramina remain open. L1-2: Negative. L2-3: Mild-to-moderate facet arthropathy and minimal disc bulge without stenosis. L3-4: Shallow disc bulge and mild-to-moderate facet arthropathy. There is some ligamentum flavum thickening. Minimal narrowing in the lateral recesses again seen and there is also mild right foraminal narrowing. The central canal and left foramen open. L4-5: Very shallow disc bulge and mild-to-moderate facet degenerative change. The central canal is open. Mild foraminal narrowing again seen. L5-S1: Shallow disc bulge and moderate facet arthropathy without stenosis. IMPRESSION: 1. Acute to subacute superior endplate compression fracture of L1 with up to 40% vertebral body height loss. There is some bony retropulsion off the superior aspect of L1 but the central canal and foramina remain open. 2. No change in lumbar spondylosis which is most notable at L3-4 where there is minimal narrowing in the lateral recesses and mild right foraminal narrowing. 3. Negative cervical and thoracic spine MRI scans. 4. Hiatal hernia. Electronically Signed   By: Debby Prader M.D.   On: 10/15/2023 10:11   DG Bone Density Result Date: 10/14/2023 Table formatting from the original result was not included. Date of study: 10/14/2023 Exam: DUAL X-RAY ABSORPTIOMETRY (DXA) FOR BONE MINERAL DENSITY (BMD) Instrument: [employer] Requesting Provider: PCP Indication: screening for low BMD Comparison: none (please note that it is not possible to compare data from different instruments) Clinical data: Pt is a 78 y.o. female with previous history of L1 vertebral compression fracture. On calcitonin, calcium , and vitamin D .  Preparing to start romosozumab . Results:  Lumbar spine L2, L4 (L1, L3) Femoral neck (FN) 33% distal radius T-score -1.1 RFN: -1.1 LFN: - 0.9 n/a  Assessment: the BMD is low according to the [facility] classification for osteoporosis (see below). Fracture risk: moderate-high FRAX score: 10 year major osteoporotic risk: 17.1%. 10 year hip fracture risk: 3.2%. The thresholds for treatment are 20% and 3%, respectively. Comments: the technical quality of the study is good, however, L1 vertebra had to be excluded from analysis due to previous compression fracture and L3 vertebra also had to be excluded from analysis due to degenerative changes. Evaluation for secondary causes should be considered if clinically indicated. Recommend optimizing calcium  (1200 mg/day) and vitamin D  (800 IU/day) intake. Followup: Repeat BMD is appropriate after 1 to 2 years. WHO criteria for diagnosis of osteoporosis in postmenopausal women and in men 38 y/o or older: - normal: T-score -1.0 to + 1.0 - osteopenia/low bone density: T-score between -2.5 and -1.0 - osteoporosis: T-score below -2.5 - severe osteoporosis: T-score below -2.5 with history of fragility fracture Note: although not part of the WHO classification, the presence of a fragility fracture, regardless of the T-score, should be considered diagnostic of osteoporosis, provided other causes for the fracture have been excluded. Treatment: The National Osteoporosis Foundation recommends that treatment be considered in postmenopausal women and men age 37 or older with: 1. Hip or vertebral (clinical or morphometric) fracture 2. T-score of - 2.5 or lower at the spine or hip 3. 10-year fracture probability by FRAX of at least 20% for  a major osteoporotic fracture and 3% for a hip fracture Lela Fendt, MD [facility]    MR BREAST BILATERAL W WO CONTRAST INC CAD Result Date: 09/26/2023 CLINICAL DATA:  High-risk screening breast MRI. Strong family history of breast cancer. Patient's sister was diagnosed with breast cancer at the age of 1. History of benign left breast biopsy. EXAM: BILATERAL BREAST MRI WITH AND WITHOUT  CONTRAST TECHNIQUE: Multiplanar, multisequence MR images of both breasts were obtained prior to and following the intravenous administration of 6 ml of [employer]  Three-dimensional MR images were rendered by post-processing of the original MR data on an independent workstation. The three-dimensional MR images were interpreted, and findings are reported in the following complete MRI report for this study. Three dimensional images were evaluated at the independent interpreting workstation using the DynaCAD thin client. COMPARISON:  Previous exam(s). FINDINGS: Breast composition: c. Heterogeneous fibroglandular tissue. Background parenchymal enhancement: Minimal Right breast: No mass or abnormal enhancement. Left breast: No mass or abnormal enhancement. Lymph nodes: No abnormal appearing lymph nodes. Ancillary findings:  None. IMPRESSION: No abnormal enhancement in either breast. RECOMMENDATION: Bilateral screening mammogram in August of 2025 is recommended. The American Cancer Society recommends annual MRI and mammography in patients with an estimated lifetime risk of developing breast cancer greater than 20 - 25%, or who are known or suspected to be positive for the breast cancer gene. BI-RADS CATEGORY  1: Negative. Electronically Signed   By: Dina  Arceo M.D.   On: 09/26/2023 14:48   CT RENAL STONE STUDY Result Date: 09/25/2023 CLINICAL DATA:  Left flank pain history of renal stones. EXAM: CT ABDOMEN AND PELVIS WITHOUT CONTRAST TECHNIQUE: Multidetector CT imaging of the abdomen and pelvis was performed following the standard protocol without IV contrast. RADIATION DOSE REDUCTION: This exam was performed according to the departmental dose-optimization program which includes automated exposure control, adjustment of the mA and/or kV according to patient size and/or use of iterative reconstruction technique. COMPARISON:  MRI September 09 2022 and CT November 01, 2013 FINDINGS: Lower chest: No acute abnormality.  Hepatobiliary: Unremarkable noncontrast enhanced appearance of the hepatic parenchyma. Gallbladder is unremarkable. No biliary ductal dilation. Pancreas: No pancreatic ductal dilation or evidence of acute inflammation. Spleen: No splenomegaly. Adrenals/Urinary Tract: Bilateral adrenal glands appear normal. Right renal lesions previously characterized as benign cysts on MRI September 09, 2022 and requiring no independent imaging follow-up per that examination. No hydronephrosis. No renal, ureteral or bladder calculi. Urinary bladder is unremarkable for degree of distension. Stomach/Bowel: Moderate-sized hiatal hernia. No pathologic dilation of small or large bowel. No evidence of acute bowel inflammation. Vascular/Lymphatic: Aortic atherosclerosis. Normal caliber abdominal aorta. Smooth IVC contours. No pathologically enlarged abdominal or pelvic lymph nodes. Reproductive: Status post hysterectomy. No adnexal masses. Other: No significant abdominopelvic free fluid. Probable prior hernia repair in the anterior abdominal wall. Surgical clips posterior to the cecum may reflect prior appendectomy. Musculoskeletal: L1 superior endplate irregularity/compression deformity with 5 mm of bony retropulsion into the canal. IMPRESSION: 1. No hydronephrosis. No renal, ureteral or bladder calculi. 2. Age-indeterminate L1 superior endplate irregularity/compression deformity with 5 mm of bony retropulsion into the canal suggest correlation with point tenderness and consider further evaluation by lumbar spine MRI. 3. Moderate-sized hiatal hernia. 4.  Aortic Atherosclerosis (ICD10-I70.0). Electronically Signed   By: Reyes Holder M.D.   On: 09/25/2023 10:24  DG Bone Density Result Date: 10/14/2023 Table formatting from the original result was not included. Date of study: 10/14/2023 Exam: DUAL X-RAY ABSORPTIOMETRY (DXA) FOR BONE MINERAL DENSITY (  BMD) Instrument: [employer] Therapist, Art Provider: PCP Indication: screening for low  BMD Comparison: none (please note that it is not possible to compare data from different instruments) Clinical data: Pt is a 78 y.o. female with previous history of L1 vertebral compression fracture. On calcitonin, calcium , and vitamin D .  Preparing to start romosozumab . Results:  Lumbar spine L2, L4 (L1, L3) Femoral neck (FN) 33% distal radius T-score -1.1 RFN: -1.1 LFN: - 0.9 n/a Assessment: the BMD is low according to the [facility] classification for osteoporosis (see below). Fracture risk: moderate-high FRAX score: 10 year major osteoporotic risk: 17.1%. 10 year hip fracture risk: 3.2%. The thresholds for treatment are 20% and 3%, respectively. Comments: the technical quality of the study is good, however, L1 vertebra had to be excluded from analysis due to previous compression fracture and L3 vertebra also had to be excluded from analysis due to degenerative changes. Evaluation for secondary causes should be considered if clinically indicated. Recommend optimizing calcium  (1200 mg/day) and vitamin D  (800 IU/day) intake. Followup: Repeat BMD is appropriate after 1 to 2 years. WHO criteria for diagnosis of osteoporosis in postmenopausal women and in men 23 y/o or older: - normal: T-score -1.0 to + 1.0 - osteopenia/low bone density: T-score between -2.5 and -1.0 - osteoporosis: T-score below -2.5 - severe osteoporosis: T-score below -2.5 with history of fragility fracture Note: although not part of the WHO classification, the presence of a fragility fracture, regardless of the T-score, should be considered diagnostic of osteoporosis, provided other causes for the fracture have been excluded. Treatment: The National Osteoporosis Foundation recommends that treatment be considered in postmenopausal women and men age 24 or older with: 1. Hip or vertebral (clinical or morphometric) fracture 2. T-score of - 2.5 or lower at the spine or hip 3. 10-year fracture probability by FRAX of at least 20% for a major osteoporotic  fracture and 3% for a hip fracture Lela Fendt, MD [facility]        Assessment & Plan Closed compression fracture of body of L1 vertebra [facility])  Stress incontinence Overactive Bladder   Her current medication, Sanctura, is less effective than solifenacin  (VESIcare ), and she is experiencing hot flashes. Due to potential side effects from increasing the Sanctura dose, we recommend switching back to solifenacin  (VESIcare ) 10 mg daily and suggest a follow-up with a urologist for further evaluation. Recurrent UTI Chronic Urinary Tract Infection (UTI)   She experiences recurrent UTIs and is currently treated with a cream twice a week, necessitating a urologist evaluation for potential long-term antibiotics and to assess for anatomical issues or surgical options. We will refer her to a urologist, discuss long-term antibiotics, and send a detailed note of her history and current treatment to the urologist. Bacteriuria  Hot flashes  History of fragility fracture Osteoporosis   Her DEXA scan indicates no significant bone loss, yet age, weight, and family history necessitate bone-building medication, with a 3.2% risk of hip fracture in the next 10 years. Evenity  is preferred for its efficacy in rebuilding bone, with Reclast  as an alternative if Evenity  proves unaffordable. After a year on Evenity , Prolia will be used to preserve bone density. We will initiate prior authorization for Evenity  through the [employer] portal and consider Reclast  if necessary. She should continue taking 1200 mg of calcium  daily along with the nasal calcitonin spray.     Orders Placed During this Encounter:   Orders Placed This Encounter  Procedures   Urinalysis, Complete   Meds ordered this encounter  Medications   solifenacin  (VESICARE ) 10 MG tablet    Sig: Take 1 tablet (10 mg total) by mouth daily.    Dispense:  90 tablet    Refill:  3    General Health Maintenance   She is proactive in scheduling  regular appointments and managing her health, including taking calcium  supplements and using nasal calcitonin spray for osteoporosis. She will continue with 1200 mg of calcium  daily and the nasal calcitonin spray.  Follow-up   A follow-up with the neurosurgeon is scheduled for January 28th. We will conduct weekly urinalysis to monitor for UTIs and discuss physical therapy options at her next visit. This document was synthesized by artificial intelligence ([employer]) using HIPAA-compliant recording of the clinical interaction;   We discussed the use of AI scribe software for clinical note transcription with the patient, who gave verbal consent to proceed.    Additional Info: This encounter employed state-of-the-art, real-time, collaborative documentation. The patient actively reviewed and assisted in updating their electronic medical record on a shared screen, ensuring transparency and facilitating joint problem-solving for the problem list, overview, and plan. This approach promotes accurate, informed care. The treatment plan was discussed and reviewed in detail, including medication safety, potential side effects, and all patient questions. We confirmed understanding and comfort with the plan. Follow-up instructions were established, including contacting the office for any concerns, returning if symptoms worsen, persist, or new symptoms develop, and precautions for potential emergency department visits.

## 2023-10-17 NOTE — Assessment & Plan Note (Signed)
 Chronic Urinary Tract Infection (UTI)   She experiences recurrent UTIs and is currently treated with a cream twice a week, necessitating a urologist evaluation for potential long-term antibiotics and to assess for anatomical issues or surgical options. We will refer her to a urologist, discuss long-term antibiotics, and send a detailed note of her history and current treatment to the urologist.

## 2023-10-18 ENCOUNTER — Ambulatory Visit: Payer: HMO | Admitting: Internal Medicine

## 2023-10-18 LAB — URINALYSIS, COMPLETE
Bilirubin Urine: NEGATIVE
Glucose, UA: NEGATIVE
Hgb urine dipstick: NEGATIVE
Hyaline Cast: NONE SEEN /[LPF]
Nitrite: NEGATIVE
Specific Gravity, Urine: 1.024 (ref 1.001–1.035)
WBC, UA: >=60 /[HPF] — AB (ref 0–5)
pH: 5.5 (ref 5.0–8.0)

## 2023-10-20 ENCOUNTER — Encounter: Payer: Self-pay | Admitting: Rheumatology

## 2023-10-20 ENCOUNTER — Other Ambulatory Visit (HOSPITAL_BASED_OUTPATIENT_CLINIC_OR_DEPARTMENT_OTHER): Payer: Self-pay

## 2023-10-20 ENCOUNTER — Ambulatory Visit: Payer: HMO | Attending: Rheumatology | Admitting: Rheumatology

## 2023-10-20 ENCOUNTER — Other Ambulatory Visit (HOSPITAL_COMMUNITY): Payer: Self-pay

## 2023-10-20 VITALS — BP 114/77 | HR 76 | Resp 14 | Ht 62.0 in | Wt 133.0 lb

## 2023-10-20 DIAGNOSIS — I7300 Raynaud's syndrome without gangrene: Secondary | ICD-10-CM | POA: Diagnosis not present

## 2023-10-20 DIAGNOSIS — K219 Gastro-esophageal reflux disease without esophagitis: Secondary | ICD-10-CM

## 2023-10-20 DIAGNOSIS — N3946 Mixed incontinence: Secondary | ICD-10-CM | POA: Diagnosis not present

## 2023-10-20 DIAGNOSIS — Z8669 Personal history of other diseases of the nervous system and sense organs: Secondary | ICD-10-CM

## 2023-10-20 DIAGNOSIS — R682 Dry mouth, unspecified: Secondary | ICD-10-CM | POA: Diagnosis not present

## 2023-10-20 DIAGNOSIS — E782 Mixed hyperlipidemia: Secondary | ICD-10-CM

## 2023-10-20 DIAGNOSIS — M5416 Radiculopathy, lumbar region: Secondary | ICD-10-CM | POA: Diagnosis not present

## 2023-10-20 DIAGNOSIS — M19041 Primary osteoarthritis, right hand: Secondary | ICD-10-CM | POA: Diagnosis not present

## 2023-10-20 DIAGNOSIS — M19072 Primary osteoarthritis, left ankle and foot: Secondary | ICD-10-CM

## 2023-10-20 DIAGNOSIS — M1712 Unilateral primary osteoarthritis, left knee: Secondary | ICD-10-CM

## 2023-10-20 DIAGNOSIS — M8589 Other specified disorders of bone density and structure, multiple sites: Secondary | ICD-10-CM

## 2023-10-20 DIAGNOSIS — L719 Rosacea, unspecified: Secondary | ICD-10-CM

## 2023-10-20 DIAGNOSIS — M19071 Primary osteoarthritis, right ankle and foot: Secondary | ICD-10-CM | POA: Diagnosis not present

## 2023-10-20 DIAGNOSIS — N1330 Unspecified hydronephrosis: Secondary | ICD-10-CM

## 2023-10-20 DIAGNOSIS — E039 Hypothyroidism, unspecified: Secondary | ICD-10-CM

## 2023-10-20 DIAGNOSIS — E1122 Type 2 diabetes mellitus with diabetic chronic kidney disease: Secondary | ICD-10-CM | POA: Diagnosis not present

## 2023-10-20 DIAGNOSIS — S32010A Wedge compression fracture of first lumbar vertebra, initial encounter for closed fracture: Secondary | ICD-10-CM

## 2023-10-20 DIAGNOSIS — M19042 Primary osteoarthritis, left hand: Secondary | ICD-10-CM

## 2023-10-20 DIAGNOSIS — M720 Palmar fascial fibromatosis [Dupuytren]: Secondary | ICD-10-CM

## 2023-10-20 DIAGNOSIS — D126 Benign neoplasm of colon, unspecified: Secondary | ICD-10-CM

## 2023-10-20 DIAGNOSIS — G250 Essential tremor: Secondary | ICD-10-CM

## 2023-10-20 DIAGNOSIS — N390 Urinary tract infection, site not specified: Secondary | ICD-10-CM | POA: Diagnosis not present

## 2023-10-20 DIAGNOSIS — N1831 Chronic kidney disease, stage 3a: Secondary | ICD-10-CM

## 2023-10-20 DIAGNOSIS — Z947 Corneal transplant status: Secondary | ICD-10-CM

## 2023-10-20 DIAGNOSIS — K449 Diaphragmatic hernia without obstruction or gangrene: Secondary | ICD-10-CM

## 2023-10-20 DIAGNOSIS — R399 Unspecified symptoms and signs involving the genitourinary system: Secondary | ICD-10-CM | POA: Diagnosis not present

## 2023-10-20 NOTE — Patient Instructions (Signed)
 Osteoarthritis  Osteoarthritis is a type of arthritis. It refers to joint pain or joint disease. Osteoarthritis affects tissue that covers the ends of bones in joints (cartilage). Cartilage acts as a cushion between the bones and helps them move smoothly. Osteoarthritis occurs when cartilage in the joints gets worn down. Osteoarthritis is sometimes called wear and tear arthritis. Osteoarthritis is the most common form of arthritis. It often occurs in older people. It is a condition that gets worse over time. The joints most often affected by this condition are in the fingers, toes, hips, knees, and spine, including the neck and lower back. What are the causes? This condition is caused by the wearing down of cartilage that covers the ends of bones. What increases the risk? The following factors may make you more likely to develop this condition: Being age 31 or older. Obesity. Overuse of joints. Past injury of a joint. Past surgery on a joint. Family history of osteoarthritis. What are the signs or symptoms? The main symptoms of this condition are pain, swelling, and stiffness in the joint. Other symptoms may include: An enlarged joint. More pain and further damage caused by small pieces of bone or cartilage that break off and float inside of the joint. Small deposits of bone (osteophytes) that grow on the edges of the joint. A grating or scraping feeling inside the joint when you move it. Popping or creaking sounds when you move. Difficulty walking or exercising. An inability to grip items, twist your hand, or control the movements of your hands and fingers. How is this diagnosed? This condition may be diagnosed based on: Your medical history. A physical exam. Your symptoms. X-rays of the affected joints. Blood tests to rule out other types of arthritis. How is this treated? There is no cure for this condition, but treatment can help control pain and improve joint function. Treatment  may include a combination of therapies, such as: Pain relief techniques, such as: Applying heat and cold to the joint. Massage. A form of talk therapy called cognitive behavioral therapy (CBT). This therapy helps you set goals and follow up on the changes that you make. Medicines for pain and inflammation. The medicines can be taken by mouth or applied to the skin. They include: NSAIDs, such as ibuprofen. Prescription medicines. Strong anti-inflammatory medicines (corticosteroids). Certain nutritional supplements. A prescribed exercise program. You may work with a physical therapist. Assistive devices, such as a brace, wrap, splint, specialized glove, or cane. A weight control plan. Surgery, such as: An osteotomy. This is done to reposition the bones and relieve pain or to remove loose pieces of bone and cartilage. Joint replacement surgery. You may need this surgery if you have advanced osteoarthritis. Follow these instructions at home: Activity Rest your affected joints as told by your health care provider. Exercise as told by your provider. The provider may recommend specific types of exercise, such as: Strengthening exercises. These are done to strengthen the muscles that support joints affected by arthritis. Aerobic activities. These are exercises, such as brisk walking or water aerobics, that increase your heart rate. Range-of-motion activities. These help your joints move more easily. Balance and agility exercises. Managing pain, stiffness, and swelling     If told, apply heat to the affected area as often as told by your provider. Use the heat source that your provider recommends, such as a moist heat pack or a heating pad. If you have a removable assistive device, remove it as told by your provider. Place a  towel between your skin and the heat source. If your provider tells you to keep the assistive device on while you apply heat, place a towel between the assistive device and  the heat source. Leave the heat on for 20-30 minutes. If told, put ice on the affected area. If you have a removable assistive device, remove it as told by your provider. Put ice in a plastic bag. Place a towel between your skin and the bag. If your provider tells you to keep the assistive device on during icing, place a towel between the assistive device and the bag. Leave the ice on for 20 minutes, 2-3 times a day. If your skin turns bright red, remove the ice or heat right away to prevent skin damage. The risk of damage is higher if you cannot feel pain, heat, or cold. Move your fingers or toes often to reduce stiffness and swelling. Raise (elevate) the affected area above the level of your heart while you are sitting or lying down. General instructions Take over-the-counter and prescription medicines only as told by your provider. Maintain a healthy weight. Follow instructions from your provider for weight control. Do not use any products that contain nicotine or tobacco. These products include cigarettes, chewing tobacco, and vaping devices, such as e-cigarettes. If you need help quitting, ask your provider. Use assistive devices as told by your provider. Where to find more information [employer]thritis and Musculoskeletal and Skin Diseases: niams.http://www.myers.net/ [employer] on Aging: baseringtones.pl American College of Rheumatology: rheumatology.org Contact a health care provider if: You have redness, swelling, or a feeling of warmth in a joint that gets worse. You have a fever along with joint or muscle aches. You develop a rash. You have trouble doing your normal activities. You have pain that gets worse and is not relieved by pain medicine. This information is not intended to replace advice given to you by your health care provider. Make sure you discuss any questions you have with your health care provider. Document Revised: 05/27/2022 Document Reviewed:  05/27/2022 Elsevier Patient Education  2024 [employer]  Low Back Sprain or Strain Rehab Ask your health care provider which exercises are safe for you. Do exercises exactly as told by your health care provider and adjust them as directed. It is normal to feel mild stretching, pulling, tightness, or discomfort as you do these exercises. Stop right away if you feel sudden pain or your pain gets worse. Do not begin these exercises until told by your health care provider. Stretching and range-of-motion exercises These exercises warm up your muscles and joints and improve the movement and flexibility of your back. These exercises also help to relieve pain, numbness, and tingling. Lumbar rotation  Lie on your back on a firm bed or the floor with your knees bent. Straighten your arms out to your sides so each arm forms a 90-degree angle (right angle) with a side of your body. Slowly move (rotate) both of your knees to one side of your body until you feel a stretch in your lower back (lumbar). Try not to let your shoulders lift off the floor. Hold this position for __________ seconds. Tense your abdominal muscles and slowly move your knees back to the starting position. Repeat this exercise on the other side of your body. Repeat __________ times. Complete this exercise __________ times a day. Single knee to chest  Lie on your back on a firm bed or the floor with both legs straight. Bend one of  your knees. Use your hands to move your knee up toward your chest until you feel a gentle stretch in your lower back and buttock. Hold your leg in this position by holding on to the front of your knee. Keep your other leg as straight as possible. Hold this position for __________ seconds. Slowly return to the starting position. Repeat with your other leg. Repeat __________ times. Complete this exercise __________ times a day. P Pelvic tilt This exercise strengthens the muscles that lie deep in the  abdomen. Lie on your back on a firm bed or the floor with your legs extended. Bend your knees so they are pointing toward the ceiling and your feet are flat on the floor. Tighten your lower abdominal muscles to press your lower back against the floor. This motion will tilt your pelvis so your tailbone points up toward the ceiling instead of pointing to your feet or the floor. To help with this exercise, you may place a small towel under your lower back and try to push your back into the towel. Hold this position for __________ seconds. Let your muscles relax completely before you repeat this exercise. Repeat __________ times. Complete this exercise __________ times a day. Alternating arm and leg raises  Get on your hands and knees on a firm surface. If you are on a hard floor, you may want to use padding, such as an exercise mat, to cushion your knees. Line up your arms and legs. Your hands should be directly below your shoulders, and your knees should be directly below your hips. Lift your left leg behind you. At the same time, raise your right arm and straighten it in front of you. Do not lift your leg higher than your hip. Do not lift your arm higher than your shoulder. Keep your abdominal and back muscles tight. Keep your hips facing the ground. Do not arch your back. Keep your balance carefully, and do not hold your breath. Hold this position for __________ seconds. Slowly return to the starting position. Repeat with your right leg and your left arm. Repeat __________ times. Complete this exercise __________ times a day. Abdominal set with straight leg raise  Lie on your back on a firm bed or the floor. Bend one of your knees and keep your other leg straight. Tense your abdominal muscles and lift your straight leg up, 4-6 inches (10-15 cm) off the ground. Keep your abdominal muscles tight and hold this position for __________ seconds. Do not hold your breath. Do not arch your back.  Keep it flat against the ground. Keep your abdominal muscles tense as you slowly lower your leg back to the starting position. Repeat with your other leg. Repeat __________ times. Complete this exercise __________ times a day. Single leg lower with bent knees Lie on your back on a firm bed or the floor. Tense your abdominal muscles and lift your feet off the floor, one foot at a time, so your knees and hips are bent in 90-degree angles (right angles). Your knees should be over your hips and your lower legs should be parallel to the floor. Keeping your abdominal muscles tense and your knee bent, slowly lower one of your legs so your toe touches the ground. Lift your leg back up to return to the starting position. Do not hold your breath. Do not let your back arch. Keep your back flat against the ground. Repeat with your other leg. Repeat __________ times. Complete this exercise __________ times a  day. Posture and body mechanics Good posture and healthy body mechanics can help to relieve stress in your body's tissues and joints. Body mechanics refers to the movements and positions of your body while you do your daily activities. Posture is part of body mechanics. Good posture means: Your spine is in its natural S-curve position (neutral). Your shoulders are pulled back slightly. Your head is not tipped forward (neutral). Follow these guidelines to improve your posture and body mechanics in your everyday activities. Standing  When standing, keep your spine neutral and your feet about hip-width apart. Keep a slight bend in your knees. Your ears, shoulders, and hips should line up. When you do a task in which you stand in one place for a long time, place one foot up on a stable object that is 2-4 inches (5-10 cm) high, such as a footstool. This helps keep your spine neutral. Sitting  When sitting, keep your spine neutral and keep your feet flat on the floor. Use a footrest, if necessary, and  keep your thighs parallel to the floor. Avoid rounding your shoulders, and avoid tilting your head forward. When working at a desk or a computer, keep your desk at a height where your hands are slightly lower than your elbows. Slide your chair under your desk so you are close enough to maintain good posture. When working at a computer, place your monitor at a height where you are looking straight ahead and you do not have to tilt your head forward or downward to look at the screen. Resting When lying down and resting, avoid positions that are most painful for you. If you have pain with activities such as sitting, bending, stooping, or squatting, lie in a position in which your body does not bend very much. For example, avoid curling up on your side with your arms and knees near your chest (fetal position). If you have pain with activities such as standing for a long time or reaching with your arms, lie with your spine in a neutral position and bend your knees slightly. Try the following positions: Lying on your side with a pillow between your knees. Lying on your back with a pillow under your knees. Lifting  When lifting objects, keep your feet at least shoulder-width apart and tighten your abdominal muscles. Bend your knees and hips and keep your spine neutral. It is important to lift using the strength of your legs, not your back. Do not lock your knees straight out. Always ask for help to lift heavy or awkward objects. This information is not intended to replace advice given to you by your health care provider. Make sure you discuss any questions you have with your health care provider. Document Revised: 01/31/2023 Document Reviewed: 12/15/2020 Elsevier Patient Education  2024 [employer].

## 2023-10-24 ENCOUNTER — Ambulatory Visit (INDEPENDENT_AMBULATORY_CARE_PROVIDER_SITE_OTHER): Payer: HMO | Admitting: Internal Medicine

## 2023-10-24 ENCOUNTER — Ambulatory Visit: Payer: HMO | Admitting: Internal Medicine

## 2023-10-24 ENCOUNTER — Encounter: Payer: Self-pay | Admitting: Internal Medicine

## 2023-10-24 VITALS — BP 132/74 | HR 73 | Temp 97.8°F | Ht 62.0 in | Wt 131.2 lb

## 2023-10-24 DIAGNOSIS — N390 Urinary tract infection, site not specified: Secondary | ICD-10-CM | POA: Diagnosis not present

## 2023-10-24 DIAGNOSIS — E1122 Type 2 diabetes mellitus with diabetic chronic kidney disease: Secondary | ICD-10-CM

## 2023-10-24 DIAGNOSIS — Z87311 Personal history of (healed) other pathological fracture: Secondary | ICD-10-CM

## 2023-10-24 DIAGNOSIS — N1831 Chronic kidney disease, stage 3a: Secondary | ICD-10-CM | POA: Diagnosis not present

## 2023-10-24 DIAGNOSIS — S32010A Wedge compression fracture of first lumbar vertebra, initial encounter for closed fracture: Secondary | ICD-10-CM

## 2023-10-24 DIAGNOSIS — M549 Dorsalgia, unspecified: Secondary | ICD-10-CM

## 2023-10-24 DIAGNOSIS — N3000 Acute cystitis without hematuria: Secondary | ICD-10-CM

## 2023-10-24 DIAGNOSIS — R232 Flushing: Secondary | ICD-10-CM

## 2023-10-24 DIAGNOSIS — N3946 Mixed incontinence: Secondary | ICD-10-CM | POA: Diagnosis not present

## 2023-10-24 DIAGNOSIS — M810 Age-related osteoporosis without current pathological fracture: Secondary | ICD-10-CM | POA: Diagnosis not present

## 2023-10-24 DIAGNOSIS — N393 Stress incontinence (female) (male): Secondary | ICD-10-CM

## 2023-10-24 DIAGNOSIS — N1832 Chronic kidney disease, stage 3b: Secondary | ICD-10-CM

## 2023-10-24 DIAGNOSIS — I7300 Raynaud's syndrome without gangrene: Secondary | ICD-10-CM | POA: Diagnosis not present

## 2023-10-24 DIAGNOSIS — G8929 Other chronic pain: Secondary | ICD-10-CM

## 2023-10-24 MED ORDER — VENLAFAXINE HCL ER 37.5 MG PO CP24: 37.5000 mg | ORAL_CAPSULE | Freq: Every day | ORAL | 3 refills | Status: DC

## 2023-10-24 NOTE — Assessment & Plan Note (Signed)
 Raynaud's Phenomenon Symptoms are well-managed with gloves. No recent flares have been noted. Continue using gloves and follow up with a specialist as needed.

## 2023-10-24 NOTE — Assessment & Plan Note (Signed)
 Urinary Incontinence There is ongoing incontinence with some improvement. Pads are used for management. A medication change from solifenacin to Xopenex was ineffective. Continue using pads and monitor symptoms for treatment adjustments.

## 2023-10-24 NOTE — Assessment & Plan Note (Signed)
 There are ongoing hot flashes with two to three episodes daily. Previous treatment with Effexor  was effective. Estrogen cream may also help. Both treatments will be started simultaneously due to different onset times for effectiveness. Start Effexor  37.5 mg daily and continue vaginal estrogen cream.  Advised patient to check self for fevers, emergency room if having fevers due to could have pyelonephritis associated with chronic recurring urinary tract infection (UTI).
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
## 2023-10-24 NOTE — Assessment & Plan Note (Signed)
>>  ASSESSMENT AND PLAN FOR CHRONIC KIDNEY DISEASE, STAGE 3B ([facility]) WRITTEN ON 10/24/2023 10:00 AM BY Graziella Connery G, MD  Chronic Kidney Disease Metformin  has been discontinued due to potential risks with kidney disease and dehydration. Regular monitoring of kidney function is required.

## 2023-10-24 NOTE — Assessment & Plan Note (Signed)
 Chronic Kidney Disease Metformin has been discontinued due to potential risks with kidney disease and dehydration. Regular monitoring of kidney function is required.
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
## 2023-10-24 NOTE — Assessment & Plan Note (Signed)
 Type 2 Diabetes Mellitus Diabetes is well-managed. Metformin has been discontinued. A weight loss drug is considered beneficial for maintaining diabetes remission. Continue the current diabetes management plan and monitor A1c levels regularly.

## 2023-10-24 NOTE — Assessment & Plan Note (Signed)
 Recurrent Urinary Tract Infections (UTIs) This issue has persisted for seven to eight months with worsening symptoms. Previous antibiotics like Keflex  and Cipro  provided only temporary relief. A urologist recommended non-antibiotic preventive measures. Diagnostic imaging showed no major urinary tract issues. Due to the risk of antibiotic resistance, antibiotics will be avoided unless necessary. Informed consent was obtained. Continue vaginal estrogen cream, take cranberry supplements, vitamin C, balance complex, and probiotics. Increase water intake to two liters daily. Perform weekly urine assessments and monitor symptoms. Follow up with the urologist on February 13. Immediate hospital visit is advised if fever or mental status changes occur.

## 2023-10-24 NOTE — Assessment & Plan Note (Signed)
 Has pain medications, not taking, has neurosurgery pending

## 2023-10-24 NOTE — Patient Instructions (Signed)
 VISIT SUMMARY:  During today's visit, we discussed your ongoing health issues, including recurrent urinary tract infections (UTIs), urinary incontinence, chronic kidney disease, type 2 diabetes, osteoporosis, hypothyroidism, hot flashes, and Raynaud's phenomenon. We reviewed your current treatment plans and made some adjustments to better manage your symptoms.  YOUR PLAN:  -RECURRENT URINARY TRACT INFECTIONS (UTIS): Recurrent UTIs are repeated infections of the urinary tract. You will continue with the non-antibiotic preventive measures recommended by your urologist, including vaginal estrogen cream, cranberry supplements, vitamin C, balance complex, probiotics, and increased water intake. Weekly urine assessments will be performed, and you should monitor your symptoms closely. Visit the hospital immediately if you experience fever or changes in mental status.  -URINARY INCONTINENCE: Urinary incontinence is the loss of bladder control. You will continue using pads to manage this issue and monitor your symptoms for any necessary treatment adjustments.  -CHRONIC KIDNEY DISEASE: Chronic kidney disease is a long-term condition where the kidneys do not work as well as they should. Metformin  has been discontinued due to potential risks, and regular monitoring of your kidney function is required.  -TYPE 2 DIABETES MELLITUS: Type 2 diabetes is a condition that affects the way your body processes blood sugar. Your diabetes is well-managed, and you will continue with your current plan, including the weight loss drug. Regular monitoring of your A1c levels is necessary.  -OSTEOPOROSIS: Osteoporosis is a condition that weakens bones, making them fragile and more likely to break. Approval for Evenity  is pending, and regular monitoring of your thyroid  and vitamin D  levels is required. We will check your TSH, free T4, and vitamin D  levels.  -HYPOTHYROIDISM: Hypothyroidism is a condition where the thyroid  gland does  not produce enough thyroid  hormone. Regular monitoring of your thyroid  levels is required. We will check your TSH and free T4 levels.  -HOT FLASHES: Hot flashes are sudden feelings of warmth, often over the face, neck, and chest. You will start Effexor  37.5 mg daily and continue using vaginal estrogen cream to manage this symptom.  -RAYNAUD'S PHENOMENON: Raynaud's phenomenon is a condition where smaller arteries that supply blood to your skin constrict excessively in response to cold or stress. Your symptoms are well-managed with gloves, and no recent flares have been noted. Continue using gloves and follow up with a specialist as needed.  -GENERAL HEALTH MAINTENANCE: You are taking a multivitamin and managing any vitamin deficiencies. Regular eye exams are scheduled. Continue with the multivitamin and attend your scheduled eye exam.  INSTRUCTIONS:  Follow up next week. Ensure Mike's primidone  prescription is refilled. Monitor weekly urine assessments and follow up with the urologist on February 13.  It was a pleasure seeing you today! Your health and satisfaction are our top priorities.  Bernardino Cone, MD  Your Providers PCP: Cone Bernardino MATSU, MD,  [phone]) Referring Provider: Cone Bernardino MATSU, MD,  [phone]) Care Team Provider: Donnald Charleston, MD,  [phone]) Care Team Provider: Porter Andrez SAUNDERS, PA-C Care Team Provider: Kemp Coke,  [phone]) Care Team Provider: Caresse Cough, MD,  [phone]) Care Team Provider: Evonnie Asberry RAMAN, DO,  [phone]) Care Team Provider: Camella Delroy SQUIBB, MD Care Team Provider: Gerome Charleston, MD,  [phone]) Care Team Provider: Unice Pac, MD,  [phone]) Care Team Provider: Pandora Cadet, [facility] Care Team Provider: Dolphus Reiter, MD,  [phone]) Care Team Provider: Lonni Slain, MD,  [phone]) Care Team Provider: Fadeyi, Oluwatoyin Alaba, NP,  [phone]) Care Team Provider: Fadeyi,  Oluwatoyin Alaba, NP,  [phone])     NEXT STEPS: [x]  Early Intervention: Schedule  sooner appointment, call our on-call services, or go to emergency room if there is any significant Increase in pain or discomfort New or worsening symptoms Sudden or severe changes in your health [x]  Flexible Follow-Up: We recommend a No follow-ups on file. for optimal routine care. This allows for progress monitoring and treatment adjustments. [x]  Preventive Care: Schedule your annual preventive care visit! It's typically covered by insurance and helps identify potential health issues early. [x]  Lab & X-ray Appointments: Incomplete tests scheduled today, or call to schedule. X-rays: [facility] (M-F, 8:30am-noon or 1pm-5pm). [x]  Medical Information Release: Sign a release form at front desk to obtain relevant medical information we don't have.  MAKING THE MOST OF OUR FOCUSED 20 MINUTE APPOINTMENTS: [x]   Clearly state your top concerns at the beginning of the visit to focus our discussion [x]   If you anticipate you will need more time, please inform the front desk during scheduling - we can book multiple appointments in the same week. [x]   If you have transportation problems- use our convenient video appointments or ask about transportation support. [x]   We can get down to business faster if you use MyChart to update information before the visit and submit non-urgent questions before your visit. Thank you for taking the time to provide details through MyChart.  Let our nurse know and she can import this information into your encounter documents.  Arrival and Wait Times: [x]   Arriving on time ensures that everyone receives prompt attention. [x]   Early morning (8a) and afternoon (1p) appointments tend to have shortest wait times. [x]   Unfortunately, we cannot delay appointments for late arrivals or hold slots during phone calls.  Getting Answers and Following Up [x]   Simple Questions &  Concerns: For quick questions or basic follow-up after your visit, reach us  at [phone] or MyChart messaging. [x]   Complex Concerns: If your concern is more complex, scheduling an appointment might be best. Discuss this with the staff to find the most suitable option. [x]   Lab & Imaging Results: We'll contact you directly if results are abnormal or you don't use MyChart. Most normal results will be on MyChart within 2-3 business days, with a review message from Dr. Jesus. Haven't heard back in 2 weeks? Need results sooner? Contact us  at [phone]. [x]   Referrals: Our referral coordinator will manage specialist referrals. The specialist's office should contact you within 2 weeks to schedule an appointment. Call us  if you haven't heard from them after 2 weeks.  Staying Connected [x]   MyChart: Activate your MyChart for the fastest way to access results and message us . See the last page of this paperwork for instructions on how to activate.  Bring to Your Next Appointment [x]   Medications: Please bring all your medication bottles to your next appointment to ensure we have an accurate record of your prescriptions. [x]   Health Diaries: If you're monitoring any health conditions at home, keeping a diary of your readings can be very helpful for discussions at your next appointment.  Billing [x]   X-ray & Lab Orders: These are billed by separate companies. Contact the invoicing company directly for questions or concerns. [x]   Visit Charges: Discuss any billing inquiries with our administrative services team.  Your Satisfaction Matters [x]   Share Your Experience: We strive for your satisfaction! If you have any complaints, or preferably compliments, please let Dr. Jesus know directly or contact our Practice Administrators, Manuelita Rubin or [employer], by asking at the front desk.  Reviewing Your Records [x]   Review this early draft of your clinical encounter notes below and the final  encounter summary tomorrow on MyChart after its been completed.  All orders placed so far are visible here: Recurrent UTI -     Urinalysis w microscopic + reflex cultur -     TSH + free T4 -     Basic metabolic panel -     Venlafaxine  HCl ER; Take 1 capsule (37.5 mg total) by mouth daily with breakfast.  Dispense: 90 capsule; Refill: 3 -     VITAMIN D  25 Hydroxy (Vit-D Deficiency, Fractures)  Type 2 diabetes mellitus with stage 3a chronic kidney disease, without long-term current use of insulin (HCC)  Chronic kidney disease, stage 3b (HCC) -     TSH + free T4 -     Basic metabolic panel -     Venlafaxine  HCl ER; Take 1 capsule (37.5 mg total) by mouth daily with breakfast.  Dispense: 90 capsule; Refill: 3 -     VITAMIN D  25 Hydroxy (Vit-D Deficiency, Fractures)  Stress incontinence  History of fragility fracture  Acute cystitis without hematuria  Raynaud's disease without gangrene  Mixed stress and urge urinary incontinence  Closed compression fracture of body of L1 vertebra (HCC)  Chronic back pain greater than 3 months duration  Osteoporosis, unspecified osteoporosis type, unspecified pathological fracture presence  Hot flashes

## 2023-10-26 LAB — CULTURE INDICATED

## 2023-10-26 LAB — URINALYSIS W MICROSCOPIC + REFLEX CULTURE
Bilirubin Urine: NEGATIVE
Glucose, UA: NEGATIVE
Hyaline Cast: NONE SEEN /LPF
Ketones, ur: NEGATIVE
Nitrites, Initial: POSITIVE — AB
Specific Gravity, Urine: 1.019 (ref 1.001–1.035)
pH: 5.5 (ref 5.0–8.0)

## 2023-10-26 LAB — URINE CULTURE
MICRO NUMBER:: 15952144
SPECIMEN QUALITY:: ADEQUATE

## 2023-10-31 ENCOUNTER — Ambulatory Visit (INDEPENDENT_AMBULATORY_CARE_PROVIDER_SITE_OTHER): Payer: HMO | Admitting: Internal Medicine

## 2023-10-31 ENCOUNTER — Encounter: Payer: Self-pay | Admitting: Internal Medicine

## 2023-10-31 VITALS — BP 102/70 | HR 78 | Temp 97.7°F | Ht 62.0 in | Wt 132.4 lb

## 2023-10-31 DIAGNOSIS — N3000 Acute cystitis without hematuria: Secondary | ICD-10-CM

## 2023-10-31 DIAGNOSIS — S32010A Wedge compression fracture of first lumbar vertebra, initial encounter for closed fracture: Secondary | ICD-10-CM | POA: Diagnosis not present

## 2023-10-31 DIAGNOSIS — E039 Hypothyroidism, unspecified: Secondary | ICD-10-CM

## 2023-10-31 DIAGNOSIS — M549 Dorsalgia, unspecified: Secondary | ICD-10-CM | POA: Diagnosis not present

## 2023-10-31 DIAGNOSIS — M545 Low back pain, unspecified: Secondary | ICD-10-CM

## 2023-10-31 DIAGNOSIS — G8929 Other chronic pain: Secondary | ICD-10-CM | POA: Diagnosis not present

## 2023-10-31 DIAGNOSIS — M810 Age-related osteoporosis without current pathological fracture: Secondary | ICD-10-CM

## 2023-10-31 DIAGNOSIS — M5416 Radiculopathy, lumbar region: Secondary | ICD-10-CM

## 2023-10-31 LAB — BASIC METABOLIC PANEL
BUN: 14 mg/dL (ref 6–23)
CO2: 26 meq/L (ref 19–32)
Calcium: 9.9 mg/dL (ref 8.4–10.5)
Chloride: 104 meq/L (ref 96–112)
Creatinine, Ser: 0.92 mg/dL (ref 0.40–1.20)
GFR: 59.06 mL/min — ABNORMAL LOW (ref >60.00)
Glucose, Bld: 69 mg/dL — ABNORMAL LOW (ref 70–99)
Potassium: 4.2 meq/L (ref 3.5–5.1)
Sodium: 141 meq/L (ref 135–145)

## 2023-10-31 LAB — VITAMIN D 25 HYDROXY (VIT D DEFICIENCY, FRACTURES): VITD: 35.99 ng/mL (ref 30.00–100.00)

## 2023-10-31 LAB — URINALYSIS, ROUTINE W REFLEX MICROSCOPIC
Bilirubin Urine: NEGATIVE
Hgb urine dipstick: NEGATIVE
Ketones, ur: NEGATIVE
Nitrite: NEGATIVE
RBC / HPF: NONE SEEN (ref 0–?)
Specific Gravity, Urine: 1.015 (ref 1.000–1.030)
Total Protein, Urine: NEGATIVE
Urine Glucose: NEGATIVE
Urobilinogen, UA: 0.2 (ref 0.0–1.0)
pH: 6 (ref 5.0–8.0)

## 2023-10-31 NOTE — Patient Instructions (Signed)
VISIT SUMMARY:  During today's visit, we discussed your persistent back pain, history of urinary tract infections, and overall health maintenance. We reviewed your current medications and lifestyle, and we have made several recommendations to help manage your conditions more effectively.  YOUR PLAN:  -CHRONIC BACK PAIN: Chronic back pain is long-lasting pain in your back, often due to a previous injury or condition. We recommend starting physical therapy at [facility] to help manage your pain. Continue using Tylenol Extra Strength as needed, and avoid heavy lifting or activities that worsen the pain. If physical therapy does not help, we may consider other treatments like nerve ablations or steroid injections.  -OSTEOPOROSIS: Osteoporosis is a condition where bones become weak and brittle. You will continue taking vitamin D and using the calcitonin nasal spray for the next three months to help with bone healing and pain reduction. We will reassess the need for additional bone-building medications after this period.  -URINARY TRACT INFECTION (UTI): A urinary tract infection is an infection in any part of your urinary system. Your recurrent UTIs are likely due to a dropped bladder. You should only use antibiotics when you have symptoms. We will perform a urinalysis today and continue weekly urine cultures. You will also follow up with your urologist in a few weeks for ongoing management.  -GENERAL HEALTH MAINTENANCE: We discussed the need to reassess your multiple vitamins and medications. We will check your thyroid function, metabolic panel, and vitamin D levels. You should bring all your medication bottles to your next visit so we can review and possibly consolidate or discontinue some of them.  INSTRUCTIONS:  Please follow up with physical therapy at [facility] and your urologist in a few weeks. Return to the clinic next week with all your medication bottles for reassessment. Blood and urine tests  will be performed today.
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
## 2023-11-01 LAB — TSH+FREE T4
Free T4: 1.14 ng/dL (ref 0.82–1.77)
TSH: 1.43 u[IU]/mL (ref 0.450–4.500)

## 2023-11-02 ENCOUNTER — Encounter: Payer: Self-pay | Admitting: Internal Medicine

## 2023-11-02 ENCOUNTER — Other Ambulatory Visit (HOSPITAL_COMMUNITY): Payer: Self-pay

## 2023-11-02 NOTE — Telephone Encounter (Signed)
20% coinsurance no deductible no admin fee no prior auth

## 2023-11-02 NOTE — Progress Notes (Signed)
All bloodwork normal and stable. Urine unchanged levels of bacteria

## 2023-11-02 NOTE — Telephone Encounter (Signed)
Pt ready for scheduling for [facility] on or after : 11/02/23  Out-of-pocket cost due at time of visit: $482.41  Number of injection/visits approved: 12  Primary: [employer] co-insurance: 20% Admin fee co-insurance: 0%  Secondary: --- [employer] co-insurance:  Admin fee co-insurance:   Medical Benefit Details: Date Benefits were checked: 11/01/23 Deductible: NO/ Coinsurance: 20%/ Admin Fee: 0%  Prior Auth: N/A PA# Expiration Date:  # of doses approved:  Pharmacy benefit: Copay $--- If patient wants fill through the pharmacy benefit please send prescription to:  --- , and include estimated need by date in rx notes. Pharmacy will ship medication directly to the office.  Patient NOT eligible for [facility] Copay Card. Copay Card can make patient's cost as little as $25. Link to apply: [email or web address]  ** This summary of benefits is an estimation of the patient's out-of-pocket cost. Exact cost may very based on individual plan coverage.

## 2023-11-04 ENCOUNTER — Ambulatory Visit: Payer: HMO | Admitting: Internal Medicine

## 2023-11-08 ENCOUNTER — Ambulatory Visit: Payer: HMO | Admitting: Internal Medicine

## 2023-11-08 DIAGNOSIS — M8000XD Age-related osteoporosis with current pathological fracture, unspecified site, subsequent encounter for fracture with routine healing: Secondary | ICD-10-CM | POA: Diagnosis not present

## 2023-11-08 DIAGNOSIS — M4856XD Collapsed vertebra, not elsewhere classified, lumbar region, subsequent encounter for fracture with routine healing: Secondary | ICD-10-CM | POA: Diagnosis not present

## 2023-11-09 ENCOUNTER — Ambulatory Visit: Payer: HMO | Admitting: Internal Medicine

## 2023-11-09 ENCOUNTER — Encounter: Payer: Self-pay | Admitting: Internal Medicine

## 2023-11-09 VITALS — BP 112/74 | HR 68 | Temp 97.2°F | Ht 62.0 in | Wt 129.7 lb

## 2023-11-09 DIAGNOSIS — Z87311 Personal history of (healed) other pathological fracture: Secondary | ICD-10-CM

## 2023-11-09 DIAGNOSIS — E1122 Type 2 diabetes mellitus with diabetic chronic kidney disease: Secondary | ICD-10-CM

## 2023-11-09 DIAGNOSIS — N183 Chronic kidney disease, stage 3 unspecified: Secondary | ICD-10-CM

## 2023-11-09 DIAGNOSIS — M8000XA Age-related osteoporosis with current pathological fracture, unspecified site, initial encounter for fracture: Secondary | ICD-10-CM | POA: Insufficient documentation

## 2023-11-09 DIAGNOSIS — S32010A Wedge compression fracture of first lumbar vertebra, initial encounter for closed fracture: Secondary | ICD-10-CM

## 2023-11-09 DIAGNOSIS — E1121 Type 2 diabetes mellitus with diabetic nephropathy: Secondary | ICD-10-CM | POA: Diagnosis not present

## 2023-11-09 DIAGNOSIS — N390 Urinary tract infection, site not specified: Secondary | ICD-10-CM | POA: Diagnosis not present

## 2023-11-09 MED ORDER — KERENDIA 20 MG PO TABS: 20.0000 mg | ORAL_TABLET | Freq: Every day | ORAL | 1 refills | Status: DC

## 2023-11-09 NOTE — Progress Notes (Unsigned)
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 (09/25/2023): L1 superior endplate irregularity/compression deformity 5mm bony retropulsion into canal Clinical correlation needed with point tenderness Plan:  MRI spine ordered for further evaluation Monitor for neurological symptoms Pain management as needed Neurosurgery referral pending MRI results     Today's Verbally Confirmed *** Current Outpatient Medications on File Prior to Visit  Medication Sig   ascorbic acid (VITAMIN C) 500 MG tablet Take 1,000 mg by mouth daily.   calcitonin, salmon, (MIACALCIN/FORTICAL) 200 UNIT/ACT nasal spray Place 1 spray into alternate nostrils daily.   Calcium Carb-Cholecalciferol (CALCIUM 500 + D) 500-5 MG-MCG TABS Take 1 tablet by mouth daily at 6 (six) AM. (Patient taking differently: Take 1 tablet by mouth daily at 6 (six) AM. 600mg )   cholecalciferol (VITAMIN D3) 25 MCG (1000 UNIT) tablet Take by mouth.   CRANBERRY EXTRACT PO Take by mouth.   cyanocobalamin  (VITAMIN B12) 1000 MCG  tablet Take 1,000 mcg by mouth daily.   estradiol (ESTRACE) 0.1 MG/GM vaginal cream Place a fingertip amount of cream in the vagina nightly x 2 weeks, then use every other night   folic acid (FOLVITE) 1 MG tablet Take 1 tablet (1 mg total) by mouth daily.   gabapentin (NEURONTIN) 600 MG tablet Take 1 tablet (600 mg total) by mouth 3 (three) times daily.   levothyroxine (SYNTHROID) 88 MCG tablet Take 1 tablet (88 mcg total) by mouth daily.   methocarbamol (ROBAXIN) 500 MG tablet Take 1 tablet (500 mg total) by mouth 4 (four) times daily.   Multiple Vitamins-Minerals (CENTRUM SILVER PO) Take by mouth.   polycarbophil (FIBERCON) 625 MG tablet Take 2 tablets (1,250 mg total) by mouth daily.   primidone (MYSOLINE) 50 MG tablet Take by mouth. 6 times a day   Probiotic Product (PROBIOTIC DAILY PO) Take by mouth.   propranolol (INDERAL) 10 MG tablet Take 1 tablet (10 mg total) by mouth 2 (two) times daily.   rosuvastatin (CRESTOR) 20 MG tablet Take 1 tablet (20 mg total) by mouth daily.   solifenacin (VESICARE) 10 MG tablet Take 1 tablet (10 mg total) by mouth daily.   tirzepatide [facility]) 5 MG/0.5ML Pen Inject 5 mg into the skin once a week.   venlafaxine XR (EFFEXOR XR) 37.5 MG 24 hr capsule Take 1 capsule (37.5 mg total) by mouth daily with breakfast.   MOUNJARO 7.5 MG/0.5ML Pen Inject 7.5 mg into the skin once a week. (Patient not taking: Reported on 11/09/2023)   Current Facility-Administered Medications on File Prior to Visit  Medication   Romosozumab-aqqg (EVENITY) 105 MG/1. injection 210 mg   Medications Discontinued During This Encounter  Medication Reason   Multiple Vitamins-Minerals (MULTIVITAMIN WITH MINERALS) tablet    vitamin B-12 (CYANOCOBALAMIN) 100 MCG tablet       Objective   Physical Exam     11/09/2023    7:56 AM 10/31/2023    8:46 AM 10/24/2023    9:00 AM  Vitals with BMI  Height 5\' 2"  5\' 2"  5\' 2"   Weight 129 lbs 11 oz 132 lbs 6 oz 131 lbs 3  oz  BMI 23.72 24.21 23.99  Systolic 112 102 295  Diastolic 74 70 74  Pulse 68 78 73   Wt Readings from Last 10 Encounters:  11/09/23 129 lb 11.2 oz (58.8 kg)  10/31/23 132 lb 6.4 oz (60.1 kg)  10/24/23 131 lb 3.2 oz (59.5 kg)  10/20/23 133 lb (60.3 kg)  10/17/23 132 lb 12.8 oz (60.2 kg)  10/06/23 129 lb 9.6 oz (58.8 kg)  09/29/23 133 lb 9.6 oz (60.6 kg)  09/26/23 132 lb 11.2 oz (60.2 kg)  09/23/23 135 lb 12.8 oz (61.6 kg)  09/21/23 134 lb 9.6 oz (61.1 kg)   Vital signs reviewed.  Nursing notes reviewed. Weight trend reviewed. Abnormalities and Problem-Specific physical exam findings:  ***  General Appearance:  No acute distress appreciable.   Well-groomed, healthy-appearing female.  Well proportioned with no abnormal fat distribution.  Good muscle tone. Pulmonary:  Normal work of breathing at rest, no respiratory distress apparent. SpO2: 95 %  Musculoskeletal: All extremities are intact.  Neurological:  Awake, alert, oriented, and engaged.  No obvious focal neurological deficits or cognitive impairments.  Sensorium seems unclouded.   Speech is clear and coherent with logical content. Psychiatric:  Appropriate mood, pleasant and cooperative demeanor, thoughtful and engaged during the exam  {Insert previous labs (optional):23779} {See past labs  Heme  Chem  Endocrine  Serology  Results Review (optional):1} No results found for any visits on 11/09/23. Office Visit on 10/31/2023  Component Date Value   Color, Urine 10/31/2023 YELLOW    APPearance 10/31/2023 Turbid (A)    Specific Gravity, Urine 10/31/2023 1.015    pH 10/31/2023 6.0    Total Protein, Urine 10/31/2023 NEGATIVE    Urine Glucose 10/31/2023 NEGATIVE    Ketones, ur 10/31/2023 NEGATIVE    Bilirubin Urine 10/31/2023 NEGATIVE    Hgb urine dipstick 10/31/2023 NEGATIVE    Urobilinogen, UA 10/31/2023 0.2    Leukocytes,Ua 10/31/2023 MODERATE (A)    Nitrite 10/31/2023 NEGATIVE    WBC, UA 10/31/2023 TNTC(>50/hpf) (A)     RBC / HPF 10/31/2023 none seen    Squamous Epithelial / HPF 10/31/2023 Few(5-10/hpf) (A)    Bacteria, UA 10/31/2023 Many(>50/hpf) (A)    TSH 10/31/2023 1.430    Free T4 10/31/2023 1.14    Sodium 10/31/2023 141    Potassium 10/31/2023 4.2    Chloride 10/31/2023 104    CO2 10/31/2023 26    Glucose, Bld 10/31/2023 69 (L)    BUN 10/31/2023 14    Creatinine, Ser 10/31/2023 0.92    GFR 10/31/2023 59.06 (L)    Calcium 10/31/2023 9.9    VITD 10/31/2023 35.99   Office Visit on 10/24/2023  Component Date Value   Color, Urine 10/24/2023 YELLOW    APPearance 10/24/2023 TURBID (A)    Specific Gravity, Urine 10/24/2023 1.019    pH 10/24/2023 5.5    Glucose, UA 10/24/2023 NEGATIVE    Bilirubin Urine 10/24/2023 NEGATIVE    Ketones, ur 10/24/2023 NEGATIVE    Hgb urine dipstick 10/24/2023 TRACE (A)    Protein, ur 10/24/2023 TRACE (A)    Nitrites, Initial 10/24/2023 POSITIVE (A)    Leukocyte Esterase 10/24/2023 3+ (A)    WBC, UA 10/24/2023 PACKED (A)    RBC / HPF 10/24/2023 0-2    Squamous Epithelial / HPF 10/24/2023 6-10 (A)    Bacteria, UA 10/24/2023 MANY (A)    Calcium Oxalate Crystal 10/24/2023 FEW    Hyaline Cast 10/24/2023 NONE SEEN    Note 10/24/2023     MICRO NUMBER: 10/24/2023 [id number]    SPECIMEN QUALITY: 10/24/2023 Adequate    Sample Source 10/24/2023 URINE    STATUS: 10/24/2023 FINAL    ISOLATE 1: 10/24/2023 Escherichia coli (A)    REFLEXIVE URINE CULTURE 10/24/2023    Office Visit on 10/17/2023  Component Date Value   Color, Urine 10/17/2023 YELLOW    APPearance 10/17/2023 CLOUDY (A)    Specific Gravity, Urine 10/17/2023 1.024    pH 10/17/2023 5.5    Glucose, UA 10/17/2023 NEGATIVE    Bilirubin Urine 10/17/2023 NEGATIVE    Ketones, ur 10/17/2023 TRACE (A)    Hgb urine dipstick 10/17/2023 NEGATIVE    Protein, ur 10/17/2023 TRACE (A)    Nitrite 10/17/2023 NEGATIVE    Leukocytes,Ua 10/17/2023 3+ (A)    WBC, UA 10/17/2023 > OR = 60 (A)    RBC / HPF 10/17/2023 0-2     Squamous Epithelial / HPF 10/17/2023 0-5    Bacteria, UA 10/17/2023 MODERATE (A)    Hyaline Cast 10/17/2023 NONE SEEN   Office Visit on 10/06/2023  Component Date Value   Color, Urine 10/06/2023 YELLOW    APPearance 10/06/2023 CLEAR    Specific Gravity, Urine 10/06/2023 1.020    pH 10/06/2023 6.0    Total Protein, Urine 10/06/2023 TRACE (A)    Urine Glucose 10/06/2023 NEGATIVE  Ketones, ur 10/06/2023 NEGATIVE    Bilirubin Urine 10/06/2023 NEGATIVE    Hgb urine dipstick 10/06/2023 SMALL (A)    Urobilinogen, UA 10/06/2023 0.2    Leukocytes,Ua 10/06/2023 MODERATE (A)    Nitrite 10/06/2023 NEGATIVE    WBC, UA 10/06/2023 TNTC(>50/hpf) (A)    RBC / HPF 10/06/2023 0-2/hpf    Mucus, UA 10/06/2023 Presence of (A)    Squamous Epithelial / HPF 10/06/2023 Rare(0-4/hpf)    Bacteria, UA 10/06/2023 Few(10-50/hpf) (A)    MICRO NUMBER: 10/06/2023 [id number]    SPECIMEN QUALITY: 10/06/2023 Adequate    Sample Source 10/06/2023 URINE    STATUS: 10/06/2023 FINAL    ISOLATE 1: 10/06/2023 Escherichia coli (A)   Lab on 09/26/2023  Component Date Value   TSH W/REFLEX TO FT4 09/26/2023 3.36    Glucose, Bld 09/26/2023 101 (H)    BUN 09/26/2023 16    Creat 09/26/2023 0.92    BUN/Creatinine Ratio 09/26/2023 SEE NOTE:    Sodium 09/26/2023 140    Potassium 09/26/2023 4.3    Chloride 09/26/2023 104    CO2 09/26/2023 22    Calcium 09/26/2023 9.6    Total Protein 09/26/2023 7.1    Albumin 09/26/2023 4.1    Globulin 09/26/2023 3.0    AG Ratio 09/26/2023 1.4    Total Bilirubin 09/26/2023 0.2    Alkaline phosphatase (AP* 09/26/2023 86    AST 09/26/2023 17    ALT 09/26/2023 15   Office Visit on 09/21/2023  Component Date Value   Beta-2 Glyco I IgG 09/21/2023 <2.0    Beta-2 Glyco 1 IgM 09/21/2023 2.4    Beta-2 Glyco 1 IgA 09/21/2023 <2.0    Anticardiolipin IgA 09/21/2023 <2.0    Anticardiolipin IgG 09/21/2023 <2.0    Anticardiolipin IgM 09/21/2023 4.1    Cryoglobulin, Qualitativ* 09/21/2023 None  Detected    Total CK 09/21/2023 28 (L)   Office Visit on 09/14/2023  Component Date Value   Color, Urine 09/14/2023 DARK YELLOW    APPearance 09/14/2023 TURBID (A)    Specific Gravity, Urine 09/14/2023 1.016    pH 09/14/2023 6.0    Glucose, UA 09/14/2023 NEGATIVE    Bilirubin Urine 09/14/2023 NEGATIVE    Ketones, ur 09/14/2023 NEGATIVE    Hgb urine dipstick 09/14/2023 1+ (A)    Protein, ur 09/14/2023 1+ (A)    Nitrite 09/14/2023 POSITIVE (A)    Leukocytes,Ua 09/14/2023 3+ (A)    WBC, UA 09/14/2023 PACKED (A)    RBC / HPF 09/14/2023 3-10 (A)    Squamous Epithelial / HPF 09/14/2023 0-5    Bacteria, UA 09/14/2023 MANY (A)    Calcium Oxalate Crystal 09/14/2023 FEW    Hyaline Cast 09/14/2023 NONE SEEN    Note 09/14/2023     Creatinine, Urine 09/14/2023 128    Protein/Creat Ratio 09/14/2023 156    Protein/Creatinine Ratio 09/14/2023 0.156    Total Protein, Urine 09/14/2023 20   Admission on 08/19/2023, Discharged on 08/19/2023  Component Date Value   Lipase 08/19/2023 40    Sodium 08/19/2023 143    Potassium 08/19/2023 3.8    Chloride 08/19/2023 108    CO2 08/19/2023 17 (L)    Glucose, Bld 08/19/2023 88    BUN 08/19/2023 10    Creatinine, Ser 08/19/2023 1.00    Calcium 08/19/2023 10.0    Total Protein 08/19/2023 7.2    Albumin 08/19/2023 4.1    AST 08/19/2023 20    ALT 08/19/2023 18    Alkaline Phosphatase 08/19/2023 94    Total Bilirubin  08/19/2023 1.4 (H)    GFR, Estimated 08/19/2023 57 (L)    Anion gap 08/19/2023 18 (H)    WBC 08/19/2023 6.7    RBC 08/19/2023 4.11    Hemoglobin 08/19/2023 12.7    HCT 08/19/2023 37.4    MCV 08/19/2023 91.0    MCH 08/19/2023 30.9    MCHC 08/19/2023 34.0    RDW 08/19/2023 13.8    Platelets 08/19/2023 197    nRBC 08/19/2023 0.0    Color, Urine 08/19/2023 YELLOW    APPearance 08/19/2023 HAZY (A)    Specific Gravity, Urine 08/19/2023 1.020    pH 08/19/2023 5.0    Glucose, UA 08/19/2023 NEGATIVE    Hgb urine dipstick 08/19/2023  NEGATIVE    Bilirubin Urine 08/19/2023 NEGATIVE    Ketones, ur 08/19/2023 80 (A)    Protein, ur 08/19/2023 100 (A)    Nitrite 08/19/2023 NEGATIVE    Leukocytes,Ua 08/19/2023 TRACE (A)    RBC / HPF 08/19/2023 0-5    WBC, UA 08/19/2023 6-10    Bacteria, UA 08/19/2023 RARE (A)    Squamous Epithelial / HPF 08/19/2023 6-10    Mucus 08/19/2023 PRESENT    Hyaline Casts, UA 08/19/2023 PRESENT   Office Visit on 08/05/2023  Component Date Value   Color, Urine 08/05/2023 YELLOW    APPearance 08/05/2023 CLEAR    Specific Gravity, Urine 08/05/2023 1.015    pH 08/05/2023 6.0    Total Protein, Urine 08/05/2023 NEGATIVE    Urine Glucose 08/05/2023 NEGATIVE    Ketones, ur 08/05/2023 NEGATIVE    Bilirubin Urine 08/05/2023 NEGATIVE    Hgb urine dipstick 08/05/2023 NEGATIVE    Urobilinogen, UA 08/05/2023 0.2    Leukocytes,Ua 08/05/2023 NEGATIVE    Nitrite 08/05/2023 NEGATIVE    WBC, UA 08/05/2023 3-6/hpf (A)    RBC / HPF 08/05/2023 0-2/hpf    Squamous Epithelial / HPF 08/05/2023 Rare(0-4/hpf)    Bacteria, UA 08/05/2023 Rare(<10/hpf) (A)    Yeast, UA 08/05/2023 Presence of (A)    Sodium 08/05/2023 140    Potassium 08/05/2023 4.6    Chloride 08/05/2023 103    CO2 08/05/2023 27    Glucose, Bld 08/05/2023 78    BUN 08/05/2023 19    Creatinine, Ser 08/05/2023 1.06    Total Bilirubin 08/05/2023 0.3    Alkaline Phosphatase 08/05/2023 106    AST 08/05/2023 18    ALT 08/05/2023 15    Total Protein 08/05/2023 7.1    Albumin 08/05/2023 4.5    GFR 08/05/2023 49.91 (L)    Calcium 08/05/2023 9.7    SSA (Ro) (ENA) Antibody,* 08/05/2023 <1.0 NEG    SSB (La) (ENA) Antibody,* 08/05/2023 <1.0 NEG    ANA Titer 1 08/05/2023 Negative    Histone Antibodies 08/05/2023 <1.0    WBC 08/05/2023 4.3    RBC 08/05/2023 4.29    Hemoglobin 08/05/2023 13.2    HCT 08/05/2023 41.2    MCV 08/05/2023 96.1    MCHC 08/05/2023 31.9    RDW 08/05/2023 15.2    Platelets 08/05/2023 141.0 (L)    Neutrophils Relative %  08/05/2023 43.1    Lymphocytes Relative 08/05/2023 48.8 (H)    Monocytes Relative 08/05/2023 6.0    Eosinophils Relative 08/05/2023 1.7    Basophils Relative 08/05/2023 0.4    Neutro Abs 08/05/2023 1.9    Lymphs Abs 08/05/2023 2.1    Monocytes Absolute 08/05/2023 0.3    Eosinophils Absolute 08/05/2023 0.1    Basophils Absolute 08/05/2023 0.0    Sed Rate 08/05/2023 9  CRP 08/05/2023 <1.0    C3 Complement 08/05/2023 134    C4 Complement 08/05/2023 16    ANA Titer 1 08/05/2023 Positive (A)    dsDNA Ab 08/05/2023 1    ENA RNP Ab 08/05/2023 <0.2    ENA SM Ab Ser-aCnc 08/05/2023 <0.2    Scleroderma (Scl-70) (EN* 08/05/2023 <0.2    ENA SSA (RO) Ab 08/05/2023 <0.2    ENA SSB (LA) Ab 08/05/2023 <0.2    Vitamin B-12 08/05/2023 413    Folate 08/05/2023 3.1 (L)    Iron 08/05/2023 135    TIBC 08/05/2023 255    %SAT 08/05/2023 53 (H)    Ferritin 08/05/2023 51    TSH W/REFLEX TO FT4 08/05/2023 0.18 (L)    Centromere Ab Screen 08/05/2023 <1.0 NEG    Free T4 08/05/2023 1.5    Speckled Pattern 08/05/2023 1:80    Note: 08/05/2023 Comment   Office Visit on 07/12/2023  Component Date Value   Color, Urine 07/12/2023 YELLOW    APPearance 07/12/2023 TURBID (A)    Specific Gravity, Urine 07/12/2023 1.025    pH 07/12/2023 < OR = 5.0    Glucose, UA 07/12/2023 3+ (A)    Bilirubin Urine 07/12/2023 NEGATIVE    Ketones, ur 07/12/2023 TRACE (A)    Hgb urine dipstick 07/12/2023 NEGATIVE    Protein, ur 07/12/2023 TRACE (A)    Nitrites, Initial 07/12/2023 POSITIVE (A)    Leukocyte Esterase 07/12/2023 1+ (A)    WBC, UA 07/12/2023 20-40 (A)    RBC / HPF 07/12/2023 0-2    Squamous Epithelial / HPF 07/12/2023 10-20 (A)    Bacteria, UA 07/12/2023 MODERATE (A)    Calcium Oxalate Crystal 07/12/2023 MODERATE (A)    Hyaline Cast 07/12/2023 0-5 (A)    Yeast 07/12/2023 FEW (A)    Note 07/12/2023     TSH W/REFLEX TO FT4 07/12/2023 0.12 (L)    Hgb A1c MFr Bld 07/12/2023 5.2    Glucose, Bld 07/12/2023 90     BUN 07/12/2023 13    Creat 07/12/2023 0.97    BUN/Creatinine Ratio 07/12/2023 SEE NOTE:    Sodium 07/12/2023 143    Potassium 07/12/2023 5.4 (H)    Chloride 07/12/2023 107    CO2 07/12/2023 26    Calcium 07/12/2023 10.1    Ferritin 07/12/2023 24.1    Free T4 07/12/2023 1.4    MICRO NUMBER: 07/12/2023 [id number]    SPECIMEN QUALITY: 07/12/2023 Adequate    Sample Source 07/12/2023 URINE    STATUS: 07/12/2023 FINAL    ISOLATE 1: 07/12/2023 Escherichia coli (A)    REFLEXIVE URINE CULTURE 07/12/2023    There may be more visits with results that are not included.  No image results found. MR Cervical Spine Wo Contrast Result Date: 10/15/2023 CLINICAL DATA:  Chronic, diffuse spine pain. EXAM: MRI CERVICAL, THORACIC AND LUMBAR SPINE WITHOUT CONTRAST TECHNIQUE: Multiplanar and multiecho pulse sequences of the cervical spine, to include the craniocervical junction and cervicothoracic junction, and thoracic and lumbar spine, were obtained without intravenous contrast. COMPARISON:  MRI thoracic spine 02/23/2023. MRI lumbar spine 10/06/2021. FINDINGS: MRI CERVICAL SPINE FINDINGS Alignment: Normal. Vertebrae: No fracture, evidence of discitis, or worrisome bone lesion. Small, benign hemangioma in C7 is incidentally noted. Cord: Normal signal throughout. Posterior Fossa, vertebral arteries, paraspinal tissues: Negative. Disc levels: The central spinal canal and neural foramina are widely patent at all levels. Very shallow disc bulging is seen at C5-6 and C6-7. MRI THORACIC SPINE FINDINGS Alignment:  Normal. Vertebrae: No fracture, evidence  of discitis, or bone lesion. Cord:  Normal signal throughout. Paraspinal and other soft tissues: Hiatal hernia noted. Disc levels: The central spinal canal and neural foramina are patent at all levels with a shallow disc bulge and small annular fissure at T11-12 noted. MRI LUMBAR SPINE FINDINGS Segmentation:  Standard. Alignment:  Trace degenerative retrolisthesis L3 on L4.  Vertebrae: The patient has a superior endplate compression fracture of L1 which is new since the comparison thoracic spine MRI. There is mild marrow edema within the vertebral body. Vertebral body height loss is estimated at up to 40%. There is some bony retropulsion as described below. No other fracture is identified. No worrisome marrow lesion or evidence of discitis. Conus medullaris and cauda equina: Conus extends to the L1-2 level. Conus and cauda equina appear normal. Paraspinal and other soft tissues: Right renal cysts incidentally noted Disc levels: T12-L1: Bony retropulsion centrally and eccentric to the right off of the superior aspect of L1 is present but the central canal and foramina remain open. L1-2: Negative. L2-3: Mild-to-moderate facet arthropathy and minimal disc bulge without stenosis. L3-4: Shallow disc bulge and mild-to-moderate facet arthropathy. There is some ligamentum flavum thickening. Minimal narrowing in the lateral recesses again seen and there is also mild right foraminal narrowing. The central canal and left foramen open. L4-5: Very shallow disc bulge and mild-to-moderate facet degenerative change. The central canal is open. Mild foraminal narrowing again seen. L5-S1: Shallow disc bulge and moderate facet arthropathy without stenosis. IMPRESSION: 1. Acute to subacute superior endplate compression fracture of L1 with up to 40% vertebral body height loss. There is some bony retropulsion off the superior aspect of L1 but the central canal and foramina remain open. 2. No change in lumbar spondylosis which is most notable at L3-4 where there is minimal narrowing in the lateral recesses and mild right foraminal narrowing. 3. Negative cervical and thoracic spine MRI scans. 4. Hiatal hernia. Electronically Signed   By: Drusilla Kanner M.D.   On: 10/15/2023 10:11   MR Lumbar Spine Wo Contrast Result Date: 10/15/2023 CLINICAL DATA:  Chronic, diffuse spine pain. EXAM: MRI CERVICAL, THORACIC  AND LUMBAR SPINE WITHOUT CONTRAST TECHNIQUE: Multiplanar and multiecho pulse sequences of the cervical spine, to include the craniocervical junction and cervicothoracic junction, and thoracic and lumbar spine, were obtained without intravenous contrast. COMPARISON:  MRI thoracic spine 02/23/2023. MRI lumbar spine 10/06/2021. FINDINGS: MRI CERVICAL SPINE FINDINGS Alignment: Normal. Vertebrae: No fracture, evidence of discitis, or worrisome bone lesion. Small, benign hemangioma in C7 is incidentally noted. Cord: Normal signal throughout. Posterior Fossa, vertebral arteries, paraspinal tissues: Negative. Disc levels: The central spinal canal and neural foramina are widely patent at all levels. Very shallow disc bulging is seen at C5-6 and C6-7. MRI THORACIC SPINE FINDINGS Alignment:  Normal. Vertebrae: No fracture, evidence of discitis, or bone lesion. Cord:  Normal signal throughout. Paraspinal and other soft tissues: Hiatal hernia noted. Disc levels: The central spinal canal and neural foramina are patent at all levels with a shallow disc bulge and small annular fissure at T11-12 noted. MRI LUMBAR SPINE FINDINGS Segmentation:  Standard. Alignment:  Trace degenerative retrolisthesis L3 on L4. Vertebrae: The patient has a superior endplate compression fracture of L1 which is new since the comparison thoracic spine MRI. There is mild marrow edema within the vertebral body. Vertebral body height loss is estimated at up to 40%. There is some bony retropulsion as described below. No other fracture is identified. No worrisome marrow lesion or evidence of discitis. Conus  medullaris and cauda equina: Conus extends to the L1-2 level. Conus and cauda equina appear normal. Paraspinal and other soft tissues: Right renal cysts incidentally noted Disc levels: T12-L1: Bony retropulsion centrally and eccentric to the right off of the superior aspect of L1 is present but the central canal and foramina remain open. L1-2: Negative.  L2-3: Mild-to-moderate facet arthropathy and minimal disc bulge without stenosis. L3-4: Shallow disc bulge and mild-to-moderate facet arthropathy. There is some ligamentum flavum thickening. Minimal narrowing in the lateral recesses again seen and there is also mild right foraminal narrowing. The central canal and left foramen open. L4-5: Very shallow disc bulge and mild-to-moderate facet degenerative change. The central canal is open. Mild foraminal narrowing again seen. L5-S1: Shallow disc bulge and moderate facet arthropathy without stenosis. IMPRESSION: 1. Acute to subacute superior endplate compression fracture of L1 with up to 40% vertebral body height loss. There is some bony retropulsion off the superior aspect of L1 but the central canal and foramina remain open. 2. No change in lumbar spondylosis which is most notable at L3-4 where there is minimal narrowing in the lateral recesses and mild right foraminal narrowing. 3. Negative cervical and thoracic spine MRI scans. 4. Hiatal hernia. Electronically Signed   By: Drusilla Kanner M.D.   On: 10/15/2023 10:11   MR THORACIC SPINE WO CONTRAST Result Date: 10/15/2023 CLINICAL DATA:  Chronic, diffuse spine pain. EXAM: MRI CERVICAL, THORACIC AND LUMBAR SPINE WITHOUT CONTRAST TECHNIQUE: Multiplanar and multiecho pulse sequences of the cervical spine, to include the craniocervical junction and cervicothoracic junction, and thoracic and lumbar spine, were obtained without intravenous contrast. COMPARISON:  MRI thoracic spine 02/23/2023. MRI lumbar spine 10/06/2021. FINDINGS: MRI CERVICAL SPINE FINDINGS Alignment: Normal. Vertebrae: No fracture, evidence of discitis, or worrisome bone lesion. Small, benign hemangioma in C7 is incidentally noted. Cord: Normal signal throughout. Posterior Fossa, vertebral arteries, paraspinal tissues: Negative. Disc levels: The central spinal canal and neural foramina are widely patent at all levels. Very shallow disc bulging is seen  at C5-6 and C6-7. MRI THORACIC SPINE FINDINGS Alignment:  Normal. Vertebrae: No fracture, evidence of discitis, or bone lesion. Cord:  Normal signal throughout. Paraspinal and other soft tissues: Hiatal hernia noted. Disc levels: The central spinal canal and neural foramina are patent at all levels with a shallow disc bulge and small annular fissure at T11-12 noted. MRI LUMBAR SPINE FINDINGS Segmentation:  Standard. Alignment:  Trace degenerative retrolisthesis L3 on L4. Vertebrae: The patient has a superior endplate compression fracture of L1 which is new since the comparison thoracic spine MRI. There is mild marrow edema within the vertebral body. Vertebral body height loss is estimated at up to 40%. There is some bony retropulsion as described below. No other fracture is identified. No worrisome marrow lesion or evidence of discitis. Conus medullaris and cauda equina: Conus extends to the L1-2 level. Conus and cauda equina appear normal. Paraspinal and other soft tissues: Right renal cysts incidentally noted Disc levels: T12-L1: Bony retropulsion centrally and eccentric to the right off of the superior aspect of L1 is present but the central canal and foramina remain open. L1-2: Negative. L2-3: Mild-to-moderate facet arthropathy and minimal disc bulge without stenosis. L3-4: Shallow disc bulge and mild-to-moderate facet arthropathy. There is some ligamentum flavum thickening. Minimal narrowing in the lateral recesses again seen and there is also mild right foraminal narrowing. The central canal and left foramen open. L4-5: Very shallow disc bulge and mild-to-moderate facet degenerative change. The central canal is open. Mild foraminal  narrowing again seen. L5-S1: Shallow disc bulge and moderate facet arthropathy without stenosis. IMPRESSION: 1. Acute to subacute superior endplate compression fracture of L1 with up to 40% vertebral body height loss. There is some bony retropulsion off the superior aspect of L1 but  the central canal and foramina remain open. 2. No change in lumbar spondylosis which is most notable at L3-4 where there is minimal narrowing in the lateral recesses and mild right foraminal narrowing. 3. Negative cervical and thoracic spine MRI scans. 4. Hiatal hernia. Electronically Signed   By: Drusilla Kanner M.D.   On: 10/15/2023 10:11   DG Bone Density Result Date: 10/14/2023 Table formatting from the original result was not included. Date of study: 10/14/2023 Exam: DUAL X-RAY ABSORPTIOMETRY (DXA) FOR BONE MINERAL DENSITY (BMD) Instrument: [employer] Requesting Provider: PCP Indication: screening for low BMD Comparison: none (please note that it is not possible to compare data from different instruments) Clinical data: Pt is a 78 y.o. female with previous history of L1 vertebral compression fracture. On calcitonin, calcium, and vitamin D.  Preparing to start romosozumab. Results:  Lumbar spine L2, L4 (L1, L3) Femoral neck (FN) 33% distal radius T-score -1.1 RFN: -1.1 LFN: - 0.9 n/a Assessment: the BMD is low according to the [facility] classification for osteoporosis (see below). Fracture risk: moderate-high FRAX score: 10 year major osteoporotic risk: 17.1%. 10 year hip fracture risk: 3.2%. The thresholds for treatment are 20% and 3%, respectively. Comments: the technical quality of the study is good, however, L1 vertebra had to be excluded from analysis due to previous compression fracture and L3 vertebra also had to be excluded from analysis due to degenerative changes. Evaluation for secondary causes should be considered if clinically indicated. Recommend optimizing calcium (1200 mg/day) and vitamin D (800 IU/day) intake. Followup: Repeat BMD is appropriate after 1 to 2 years. WHO criteria for diagnosis of osteoporosis in postmenopausal women and in men 47 y/o or older: - normal: T-score -1.0 to + 1.0 - osteopenia/low bone density: T-score between -2.5 and -1.0 - osteoporosis: T-score below -2.5 -  severe osteoporosis: T-score below -2.5 with history of fragility fracture Note: although not part of the WHO classification, the presence of a fragility fracture, regardless of the T-score, should be considered diagnostic of osteoporosis, provided other causes for the fracture have been excluded. Treatment: The National Osteoporosis Foundation recommends that treatment be considered in postmenopausal women and men age 41 or older with: 1. Hip or vertebral (clinical or morphometric) fracture 2. T-score of - 2.5 or lower at the spine or hip 3. 10-year fracture probability by FRAX of at least 20% for a major osteoporotic fracture and 3% for a hip fracture Carlus Pavlov, MD [facility]    MR BREAST BILATERAL W WO CONTRAST INC CAD Result Date: 09/26/2023 CLINICAL DATA:  High-risk screening breast MRI. Strong family history of breast cancer. Patient's sister was diagnosed with breast cancer at the age of 49. History of benign left breast biopsy. EXAM: BILATERAL BREAST MRI WITH AND WITHOUT CONTRAST TECHNIQUE: Multiplanar, multisequence MR images of both breasts were obtained prior to and following the intravenous administration of 6 ml of Vueway Three-dimensional MR images were rendered by post-processing of the original MR data on an independent workstation. The three-dimensional MR images were interpreted, and findings are reported in the following complete MRI report for this study. Three dimensional images were evaluated at the independent interpreting workstation using the DynaCAD thin client. COMPARISON:  Previous exam(s). FINDINGS: Breast composition: c. Heterogeneous  fibroglandular tissue. Background parenchymal enhancement: Minimal Right breast: No mass or abnormal enhancement. Left breast: No mass or abnormal enhancement. Lymph nodes: No abnormal appearing lymph nodes. Ancillary findings:  None. IMPRESSION: No abnormal enhancement in either breast. RECOMMENDATION: Bilateral screening mammogram  in August of 2025 is recommended. The American Cancer Society recommends annual MRI and mammography in patients with an estimated lifetime risk of developing breast cancer greater than 20 - 25%, or who are known or suspected to be positive for the breast cancer gene. BI-RADS CATEGORY  1: Negative. Electronically Signed   By: Baird Lyons M.D.   On: 09/26/2023 14:48   CT RENAL STONE STUDY Result Date: 09/25/2023 CLINICAL DATA:  Left flank pain history of renal stones. EXAM: CT ABDOMEN AND PELVIS WITHOUT CONTRAST TECHNIQUE: Multidetector CT imaging of the abdomen and pelvis was performed following the standard protocol without IV contrast. RADIATION DOSE REDUCTION: This exam was performed according to the departmental dose-optimization program which includes automated exposure control, adjustment of the mA and/or kV according to patient size and/or use of iterative reconstruction technique. COMPARISON:  MRI September 09 2022 and CT November 01, 2013 FINDINGS: Lower chest: No acute abnormality. Hepatobiliary: Unremarkable noncontrast enhanced appearance of the hepatic parenchyma. Gallbladder is unremarkable. No biliary ductal dilation. Pancreas: No pancreatic ductal dilation or evidence of acute inflammation. Spleen: No splenomegaly. Adrenals/Urinary Tract: Bilateral adrenal glands appear normal. Right renal lesions previously characterized as benign cysts on MRI September 09, 2022 and requiring no independent imaging follow-up per that examination. No hydronephrosis. No renal, ureteral or bladder calculi. Urinary bladder is unremarkable for degree of distension. Stomach/Bowel: Moderate-sized hiatal hernia. No pathologic dilation of small or large bowel. No evidence of acute bowel inflammation. Vascular/Lymphatic: Aortic atherosclerosis. Normal caliber abdominal aorta. Smooth IVC contours. No pathologically enlarged abdominal or pelvic lymph nodes. Reproductive: Status post hysterectomy. No adnexal masses. Other: No  significant abdominopelvic free fluid. Probable prior hernia repair in the anterior abdominal wall. Surgical clips posterior to the cecum may reflect prior appendectomy. Musculoskeletal: L1 superior endplate irregularity/compression deformity with 5 mm of bony retropulsion into the canal. IMPRESSION: 1. No hydronephrosis. No renal, ureteral or bladder calculi. 2. Age-indeterminate L1 superior endplate irregularity/compression deformity with 5 mm of bony retropulsion into the canal suggest correlation with point tenderness and consider further evaluation by lumbar spine MRI. 3. Moderate-sized hiatal hernia. 4.  Aortic Atherosclerosis (ICD10-I70.0). Electronically Signed   By: Maudry Mayhew M.D.   On: 09/25/2023 10:24  DG Bone Density Result Date: 10/14/2023 Table formatting from the original result was not included. Date of study: 10/14/2023 Exam: DUAL X-RAY ABSORPTIOMETRY (DXA) FOR BONE MINERAL DENSITY (BMD) Instrument: [employer] Requesting Provider: PCP Indication: screening for low BMD Comparison: none (please note that it is not possible to compare data from different instruments) Clinical data: Pt is a 78 y.o. female with previous history of L1 vertebral compression fracture. On calcitonin, calcium, and vitamin D.  Preparing to start romosozumab. Results:  Lumbar spine L2, L4 (L1, L3) Femoral neck (FN) 33% distal radius T-score -1.1 RFN: -1.1 LFN: - 0.9 n/a Assessment: the BMD is low according to the [facility] classification for osteoporosis (see below). Fracture risk: moderate-high FRAX score: 10 year major osteoporotic risk: 17.1%. 10 year hip fracture risk: 3.2%. The thresholds for treatment are 20% and 3%, respectively. Comments: the technical quality of the study is good, however, L1 vertebra had to be excluded from analysis due to previous compression fracture and L3 vertebra also had to be excluded from  analysis due to degenerative changes. Evaluation for secondary causes should be considered if  clinically indicated. Recommend optimizing calcium (1200 mg/day) and vitamin D (800 IU/day) intake. Followup: Repeat BMD is appropriate after 1 to 2 years. WHO criteria for diagnosis of osteoporosis in postmenopausal women and in men 29 y/o or older: - normal: T-score -1.0 to + 1.0 - osteopenia/low bone density: T-score between -2.5 and -1.0 - osteoporosis: T-score below -2.5 - severe osteoporosis: T-score below -2.5 with history of fragility fracture Note: although not part of the WHO classification, the presence of a fragility fracture, regardless of the T-score, should be considered diagnostic of osteoporosis, provided other causes for the fracture have been excluded. Treatment: The National Osteoporosis Foundation recommends that treatment be considered in postmenopausal women and men age 33 or older with: 1. Hip or vertebral (clinical or morphometric) fracture 2. T-score of - 2.5 or lower at the spine or hip 3. 10-year fracture probability by FRAX of at least 20% for a major osteoporotic fracture and 3% for a hip fracture Carlus Pavlov, MD [facility]        Assessment & Plan Closed compression fracture of body of L1 vertebra (HCC)  ***     Orders Placed During this Encounter:  No orders of the defined types were placed in this encounter.  No orders of the defined types were placed in this encounter.      This document was synthesized by artificial intelligence ([employer]bridge) using HIPAA-compliant recording of the clinical interaction;   We discussed the use of AI scribe software for clinical note transcription with the patient, who gave verbal consent to proceed.    Additional Info: This encounter employed state-of-the-art, real-time, collaborative documentation. The patient actively reviewed and assisted in updating their electronic medical record on a shared screen, ensuring transparency and facilitating joint problem-solving for the problem list, overview, and plan. This approach  promotes accurate, informed care. The treatment plan was discussed and reviewed in detail, including medication safety, potential side effects, and all patient questions. We confirmed understanding and comfort with the plan. Follow-up instructions were established, including contacting the office for any concerns, returning if symptoms worsen, persist, or new symptoms develop, and precautions for potential emergency department visits.

## 2023-11-10 ENCOUNTER — Telehealth: Payer: Self-pay

## 2023-11-10 ENCOUNTER — Other Ambulatory Visit (HOSPITAL_COMMUNITY): Payer: Self-pay

## 2023-11-10 NOTE — Assessment & Plan Note (Signed)
Chronic UTI Diabetes Mellitus

## 2023-11-10 NOTE — Patient Instructions (Signed)
VISIT SUMMARY:  During today's visit, we discussed the ongoing management of your L1 compression fracture and reviewed your current medications and overall health. We also addressed your chronic pain management, diabetes, cholesterol, thyroid function, and chronic UTIs. We made some adjustments to your medication regimen and provided recommendations for your general health maintenance.  YOUR PLAN:  -L1 COMPRESSION FRACTURE: An L1 compression fracture is a break in the first lumbar vertebra, often due to trauma. We recommend continuing conservative management with muscle relaxers and acetaminophen for pain, avoiding heavy lifting, and gradually increasing activity. Follow up with the spinal specialist on March 11 for an x-ray review.  -CHRONIC PAIN MANAGEMENT: Chronic pain is ongoing pain that persists beyond the usual recovery period. Your pain is managed with gabapentin, muscle relaxers, and acetaminophen. Continue with gabapentin if it helps, or gradually reduce if it does not. Use muscle relaxers and acetaminophen as needed.  -OSTEOPOROSIS: Osteoporosis is a condition where bones become weak and brittle. Although your bone scan did not confirm osteoporosis, continue using miacalcin nasal spray for pain relief until your current supply is exhausted.  -DIABETES MELLITUS: Diabetes mellitus is a condition where blood sugar levels are too high. Continue managing your diabetes with Mounjaro 5.0 mg as prescribed.  -HYPERLIPIDEMIA: Hyperlipidemia is having high levels of fats (lipids) in the blood, which can increase the risk of heart disease. Continue taking Crestor 20 mg daily to manage your cholesterol levels.  -HYPOTHYROIDISM: Hypothyroidism is a condition where the thyroid gland does not produce enough thyroid hormone. Your condition is well-managed with levothyroxine 88 mcg daily. Continue this dosage and recheck your thyroid function in 3-6 months.  -CHRONIC UTI: Chronic urinary tract infections  are repeated infections of the urinary system. Continue taking cranberry extract as recommended by your urologist and perform urinalysis to monitor changes.  -GENERAL HEALTH MAINTENANCE: For your general health, continue taking your multivitamin (Centrum Silver), one vitamin D supplement, and a probiotic. Discontinue vitamin C, additional vitamin D, and folic acid as they are not necessary.  INSTRUCTIONS:  Follow up with the spinal specialist on March 11 and the urologist in 1-2 weeks. Monitor and adjust medications as needed. Review insurance denials and resubmit with appropriate codes.  It was a pleasure seeing you today! Your health and satisfaction are our top priorities.  Glenetta Hew, MD  Your Providers PCP: Lula Olszewski, MD,  [phone]) Referring Provider: Lula Olszewski, MD,  [phone]) Care Team Provider: Bernette Redbird, MD,  [phone]) Care Team Provider: Suzi Roots Care Team Provider: Domingo Mend,  [phone]) Care Team Provider: Holli Humbles, MD,  [phone]) Care Team Provider: Vladimir Faster, DO,  [phone]) Care Team Provider: Reece Packer, MD Care Team Provider: Eugenia Mcalpine, MD,  [phone]) Care Team Provider: Maeola Harman, MD,  [phone]) Care Team Provider: Dahlia Byes, [facility] Care Team Provider: Pollyann Savoy, MD,  [phone]) Care Team Provider: Jodelle Red, MD,  [phone]) Care Team Provider: Paulina Fusi, NP,  [phone]) Care Team Provider: Paulina Fusi, NP,  [phone])     NEXT STEPS: [x]  Early Intervention: Schedule sooner appointment, call our on-call services, or go to emergency room if there is any significant Increase in pain or discomfort New or worsening symptoms Sudden or severe changes in your health [x]  Flexible Follow-Up: We recommend a No follow-ups on file. for optimal routine care. This allows for progress monitoring and  treatment adjustments. [x]  Preventive Care: Schedule your annual preventive care visit! It's typically covered by insurance and helps  identify potential health issues early. [x]  Lab & X-ray Appointments: Incomplete tests scheduled today, or call to schedule. X-rays: [facility] (M-F, 8:30am-noon or 1pm-5pm). [x]  Medical Information Release: Sign a release form at front desk to obtain relevant medical information we don't have.  MAKING THE MOST OF OUR FOCUSED 20 MINUTE APPOINTMENTS: [x]   Clearly state your top concerns at the beginning of the visit to focus our discussion [x]   If you anticipate you will need more time, please inform the front desk during scheduling - we can book multiple appointments in the same week. [x]   If you have transportation problems- use our convenient video appointments or ask about transportation support. [x]   We can get down to business faster if you use MyChart to update information before the visit and submit non-urgent questions before your visit. Thank you for taking the time to provide details through MyChart.  Let our nurse know and she can import this information into your encounter documents.  Arrival and Wait Times: [x]   Arriving on time ensures that everyone receives prompt attention. [x]   Early morning (8a) and afternoon (1p) appointments tend to have shortest wait times. [x]   Unfortunately, we cannot delay appointments for late arrivals or hold slots during phone calls.  Getting Answers and Following Up [x]   Simple Questions & Concerns: For quick questions or basic follow-up after your visit, reach [place] at [phone] or MyChart messaging. [x]   Complex Concerns: If your concern is more complex, scheduling an appointment might be best. Discuss this with the staff to find the most suitable option. [x]   Lab & Imaging Results: We'll contact you directly if results are abnormal or you don't use MyChart. Most normal results will be on MyChart  within 2-3 business days, with a review message from Dr. Jon Billings. Haven't heard back in 2 weeks? Need results sooner? Contact us at [phone]. [x]   Referrals: Our referral coordinator will manage specialist referrals. The specialist's office should contact you within 2 weeks to schedule an appointment. Call us if you haven't heard from them after 2 weeks.  Staying Connected [x]   MyChart: Activate your MyChart for the fastest way to access results and message [place]. See the last page of this paperwork for instructions on how to activate.  Bring to Your Next Appointment [x]   Medications: Please bring all your medication bottles to your next appointment to ensure we have an accurate record of your prescriptions. [x]   Health Diaries: If you're monitoring any health conditions at home, keeping a diary of your readings can be very helpful for discussions at your next appointment.  Billing [x]   X-ray & Lab Orders: These are billed by separate companies. Contact the invoicing company directly for questions or concerns. [x]   Visit Charges: Discuss any billing inquiries with our administrative services team.  Your Satisfaction Matters [x]   Share Your Experience: We strive for your satisfaction! If you have any complaints, or preferably compliments, please let Dr. Jon Billings know directly or contact our Practice Administrators, Edwena Felty or [employer], by asking at the front desk.   Reviewing Your Records [x]   Review this early draft of your clinical encounter notes below and the final encounter summary tomorrow on MyChart after its been completed.  All orders placed so far are visible here: Closed compression fracture of body of L1 vertebra (HCC)  Diabetic nephropathy with proteinuria (HCC) -     Urinalysis w microscopic + reflex cultur -     Chauncey Mann; Take 1  tablet (20 mg total) by mouth daily.  Dispense: 90 tablet; Refill: 1  History of fragility fracture  Type 2 diabetes mellitus with  stage 3 chronic kidney disease, without long-term current use of insulin, unspecified whether stage 3a or 3b CKD (HCC)  Chronic UTI  Other orders -     Urine Culture -     REFLEXIVE URINE CULTURE

## 2023-11-10 NOTE — Assessment & Plan Note (Signed)
Osteoporosis Miacalcin nasal spray is used for pain relief in acute compression fractures. Bone scan results are inconclusive for osteoporosis, but the spray is preferred if beneficial for bone health. Continue miacalcin nasal spray until the current supply is exhausted, then discontinue.

## 2023-11-10 NOTE — Telephone Encounter (Signed)
Pharmacy Patient Advocate Encounter   Received notification from CoverMyMeds that prior authorization for Kerendia 20MG  tablets is required/requested.   Insurance verification completed.   The patient is insured through [facility] ADVANTAGE/RX ADVANCE .   Prior Authorization form/request asks a question that requires your assistance. Please see the question below and advise accordingly. The PA will not be submitted until the necessary information is received.

## 2023-11-11 LAB — URINALYSIS W MICROSCOPIC + REFLEX CULTURE
Bacteria, UA: NONE SEEN /[HPF]
Bilirubin Urine: NEGATIVE
Glucose, UA: NEGATIVE
Hgb urine dipstick: NEGATIVE
Hyaline Cast: NONE SEEN /[LPF]
Ketones, ur: NEGATIVE
Nitrites, Initial: NEGATIVE
Protein, ur: NEGATIVE
Specific Gravity, Urine: 1.014 (ref 1.001–1.035)
pH: 6.5 (ref 5.0–8.0)

## 2023-11-11 LAB — URINE CULTURE
MICRO NUMBER:: 16019210
SPECIMEN QUALITY:: ADEQUATE

## 2023-11-11 LAB — CULTURE INDICATED

## 2023-11-15 DIAGNOSIS — L821 Other seborrheic keratosis: Secondary | ICD-10-CM | POA: Diagnosis not present

## 2023-11-15 DIAGNOSIS — L718 Other rosacea: Secondary | ICD-10-CM | POA: Diagnosis not present

## 2023-11-16 ENCOUNTER — Encounter: Payer: Self-pay | Admitting: Internal Medicine

## 2023-11-16 ENCOUNTER — Ambulatory Visit (INDEPENDENT_AMBULATORY_CARE_PROVIDER_SITE_OTHER): Payer: HMO | Admitting: Internal Medicine

## 2023-11-16 VITALS — BP 110/72 | HR 64 | Temp 97.5°F | Ht 62.0 in | Wt 130.6 lb

## 2023-11-16 DIAGNOSIS — R82998 Other abnormal findings in urine: Secondary | ICD-10-CM | POA: Diagnosis not present

## 2023-11-16 DIAGNOSIS — I700 Atherosclerosis of aorta: Secondary | ICD-10-CM | POA: Diagnosis not present

## 2023-11-16 DIAGNOSIS — E538 Deficiency of other specified B group vitamins: Secondary | ICD-10-CM

## 2023-11-16 DIAGNOSIS — L719 Rosacea, unspecified: Secondary | ICD-10-CM

## 2023-11-16 DIAGNOSIS — N390 Urinary tract infection, site not specified: Secondary | ICD-10-CM

## 2023-11-16 DIAGNOSIS — N200 Calculus of kidney: Secondary | ICD-10-CM

## 2023-11-16 DIAGNOSIS — N1831 Chronic kidney disease, stage 3a: Secondary | ICD-10-CM

## 2023-11-16 DIAGNOSIS — D631 Anemia in chronic kidney disease: Secondary | ICD-10-CM

## 2023-11-16 DIAGNOSIS — S32010A Wedge compression fracture of first lumbar vertebra, initial encounter for closed fracture: Secondary | ICD-10-CM

## 2023-11-16 MED ORDER — GABAPENTIN 300 MG PO CAPS: 600.0000 mg | ORAL_CAPSULE | Freq: Three times a day (TID) | ORAL | 1 refills | Status: DC

## 2023-11-16 NOTE — Patient Instructions (Addendum)
 VISIT SUMMARY:  You visited today due to increased back pain and urinary symptoms. She has a history of chronic back pain, recurrent urinary tract infections (UTIs), kidney stones, and rosacea. Her back pain has worsened with physical activity, and she is concerned about her current pain management regimen. She also reports increased urination since starting a new medication and has a history of manageable hot flashes and rosacea.  YOUR PLAN:  -CHRONIC BACK PAIN DUE TO COMPRESSION FRACTURE OF L1: Chronic back pain is caused by a compression fracture in one of the vertebrae in your lower back. The pain has worsened with increased physical activity. We will increase your gabapentin  dose to 600 mg three times a day and limit Tylenol  to 2000 mg per day. Avoid NSAIDs and narcotics due to potential risks. Pain may improve over time but could persist. We will discuss new pain medications when they become available.  -STAGE 3 CHRONIC KIDNEY DISEASE: Stage 3 chronic kidney disease means your kidneys are moderately damaged and not working as well as they should. To prevent further damage, avoid NSAIDs and limit Tylenol  to 2000 mg per day. Stay well-hydrated to help your kidneys function better. We will continue to monitor your kidney function.  -RECURRENT URINARY TRACT INFECTIONS (UTIS): Recurrent UTIs are likely due to calcium  oxalate crystals in your urine. Increased fluid intake and dietary modifications can help manage and prevent kidney stones and UTIs. We will provide a dietary modification handout and may consider a 24-hour urine collection to adjust your medications. Follow up with a urologist is recommended.  -ROSACEA: Rosacea is a skin condition that causes redness and visible blood vessels on your face. Current topical treatments are only partially effective. Laser treatment was discussed as a more effective option, but it is not covered by insurance and requires multiple sessions. Consider this option  if you are willing to pay out of pocket.  -GENERAL HEALTH MAINTENANCE: Your recent blood work showed good kidney function and thyroid  levels. Continue with yearly mammograms due to a high risk for breast cancer. Maintain a balanced diet and consider taking B6 supplements to help manage kidney stones.  INSTRUCTIONS:  Follow up with a urologist next Thursday and return for a follow-up visit in two weeks. A dietary handout will be provided to help manage your kidney stones and UTIs.  For a patient with persistent calcium  oxalate crystals in urine, here's a comprehensive approach:  Labs to Check: 1. Comprehensive metabolic panel    - Assess kidney function (creatinine, eGFR)    - Check electrolytes    - Evaluate calcium  and phosphorus levels  2. 24-hour urine collection to evaluate:    - Calcium  excretion    - Oxalate levels    - Citrate levels    - Creatinine clearance    - Total urine volume  3. Serum studies:    - Intact PTH (to rule out hyperparathyroidism)    - Vitamin D  levels    - Uric acid    - Serum calcium     - Serum oxalate  4. Additional specialized tests:    - Urinalysis with microscopic examination    - Urine pH    - Urine culture to rule out infection  Diet Recommendations: 1. Fluid intake    - Encourage 2-3 liters of water daily    - Spread fluid intake throughout the day  2. Dietary modifications    - Limit sodium intake (< 2300 mg/day)    - Moderate protein intake    -  Calcium -rich foods (paradoxically helpful)    - Reduce oxalate-rich foods:      * Spinach      * Rhubarb      * Nuts      * Chocolate      * Beets      * Sweet potatoes  3. Specific dietary guidelines    - Calcium  intake: 1000-1200 mg/day    - Consume calcium  with meals to bind oxalate in gut    - Limit animal protein    - Reduce sugar and processed food intake    - Consider citrus fruits (natural citrate source)  Exercise Recommendations: 1. Regular physical activity    - 150  minutes moderate aerobic exercise weekly    - Weight-bearing exercises    - Walking    - Swimming    - Cycling  2. Weight management    - Help patient achieve/maintain healthy BMI    - Metabolic syndrome increases stone risk  Additional Management: 1. Consider pharmacological interventions    - Potassium citrate    - Thiazide diuretics    - Vitamin B6 supplementation    - Discuss with patient based on specific metabolic findings  2. Patient education    - Explain stone formation mechanisms    - Importance of hydration    - Dietary modifications    - Regular follow-up  Monitoring: - Repeat 24-hour urine collection in 3-6 months - Ultrasound to track kidney stone formation - Annual metabolic screening  Red Flags to Watch: - Increasing stone size - Recurrent urinary tract infections - Changes in kidney function - Persistent pain  Referral Considerations: - Urology consultation if:   * Large stones   * Frequent recurrence   * Complications - Nephrology if complex metabolic issues  It was a pleasure seeing you today! Your health and satisfaction are our top priorities.  Bernardino Cone, MD  Your Providers PCP: Cone Bernardino MATSU, MD,  [phone]) Referring Provider: Cone Bernardino MATSU, MD,  [phone]) Care Team Provider: Donnald Charleston, MD,  [phone]) Care Team Provider: Porter Andrez JONELLE DEVONNA Care Team Provider: Kemp Coke,  [phone]) Care Team Provider: Caresse Cough, MD,  [phone]) Care Team Provider: Evonnie Asberry RAMAN, DO,  [phone]) Care Team Provider: Camella Delroy SQUIBB, MD Care Team Provider: Gerome Charleston, MD,  [phone]) Care Team Provider: Unice Pac, MD,  [phone]) Care Team Provider: Pandora Cadet, [facility] Care Team Provider: Dolphus Reiter, MD,  [phone]) Care Team Provider: Lonni Slain, MD,  [phone]) Care Team Provider: Fadeyi, Oluwatoyin Alaba, NP,  [phone]) Care Team Provider:  Fadeyi, Oluwatoyin Alaba, NP,  [phone])     NEXT STEPS: [x]  Early Intervention: Schedule sooner appointment, call our on-call services, or go to emergency room if there is any significant Increase in pain or discomfort New or worsening symptoms Sudden or severe changes in your health [x]  Flexible Follow-Up: We recommend a No follow-ups on file. for optimal routine care. This allows for progress monitoring and treatment adjustments. [x]  Preventive Care: Schedule your annual preventive care visit! It's typically covered by insurance and helps identify potential health issues early. [x]  Lab & X-ray Appointments: Incomplete tests scheduled today, or call to schedule. X-rays: [facility] (M-F, 8:30am-noon or 1pm-5pm). [x]  Medical Information Release: Sign a release form at front desk to obtain relevant medical information we don't have.  MAKING THE MOST OF OUR FOCUSED 20 MINUTE APPOINTMENTS: [x]   Clearly state your top concerns at the beginning of the visit to focus  our discussion [x]   If you anticipate you will need more time, please inform the front desk during scheduling - we can book multiple appointments in the same week. [x]   If you have transportation problems- use our convenient video appointments or ask about transportation support. [x]   We can get down to business faster if you use MyChart to update information before the visit and submit non-urgent questions before your visit. Thank you for taking the time to provide details through MyChart.  Let our nurse know and she can import this information into your encounter documents.  Arrival and Wait Times: [x]   Arriving on time ensures that everyone receives prompt attention. [x]   Early morning (8a) and afternoon (1p) appointments tend to have shortest wait times. [x]   Unfortunately, we cannot delay appointments for late arrivals or hold slots during phone calls.  Getting Answers and Following Up [x]   Simple  Questions & Concerns: For quick questions or basic follow-up after your visit, reach us  at [phone] or MyChart messaging. [x]   Complex Concerns: If your concern is more complex, scheduling an appointment might be best. Discuss this with the staff to find the most suitable option. [x]   Lab & Imaging Results: We'll contact you directly if results are abnormal or you don't use MyChart. Most normal results will be on MyChart within 2-3 business days, with a review message from Dr. Jesus. Haven't heard back in 2 weeks? Need results sooner? Contact us  at [phone]. [x]   Referrals: Our referral coordinator will manage specialist referrals. The specialist's office should contact you within 2 weeks to schedule an appointment. Call us  if you haven't heard from them after 2 weeks.  Staying Connected [x]   MyChart: Activate your MyChart for the fastest way to access results and message us . See the last page of this paperwork for instructions on how to activate.  Bring to Your Next Appointment [x]   Medications: Please bring all your medication bottles to your next appointment to ensure we have an accurate record of your prescriptions. [x]   Health Diaries: If you're monitoring any health conditions at home, keeping a diary of your readings can be very helpful for discussions at your next appointment.  Billing [x]   X-ray & Lab Orders: These are billed by separate companies. Contact the invoicing company directly for questions or concerns. [x]   Visit Charges: Discuss any billing inquiries with our administrative services team.  Your Satisfaction Matters [x]   Share Your Experience: We strive for your satisfaction! If you have any complaints, or preferably compliments, please let Dr. Jesus know directly or contact our Practice Administrators, Manuelita Rubin or [employer], by asking at the front desk.   Reviewing Your Records [x]   Review this early draft of your clinical encounter notes below and  the final encounter summary tomorrow on MyChart after its been completed.  All orders placed so far are visible here: Closed compression fracture of body of L1 vertebra (HCC) -     Gabapentin ; Take 2 capsules (600 mg total) by mouth 3 (three) times daily.  Dispense: 540 capsule; Refill: 1  Recurrent UTI Assessment & Plan: Recurrent Urinary Tract Infections (UTIs) Recurrent UTIs are likely due to calcium  oxalate crystals in the urine. Increased urination with Kerendia  is reported. Recent urine analysis showed a faint amount of blood, likely from microcrystals. Hydration and dietary modifications are important to manage and prevent kidney stones. Increased fluid intake can help flush out microcrystals and reduce UTI recurrence. A 24-hour urine collection may be needed  to adjust medications. Increase fluid intake, provide a dietary modification handout, follow up with a urologist, and consider a 24-hour urine collection if recommended.   Rosacea Assessment & Plan: Rosacea Rosacea is managed with topical treatments. More effective treatments were inquired about, and laser treatment was discussed. It is not covered by insurance, requires multiple sessions, and is costly. Consider laser treatment if willing to pay out of pocket.   Calcium  oxalate crystals in urine Assessment & Plan: Extensive discussion of need to change diet, and AVS given. She defers/declines comprehensive workup recommendations today, and has full decision making capacity, but will discuss with urologist more so I put those recommendations in her AVS.   Chronic kidney disease, stage 3a (HCC) Assessment & Plan: Stage 3 Chronic Kidney Disease Stage 3 chronic kidney disease with a GFR of 59 requires avoiding NSAIDs and limiting Tylenol  to prevent further kidney damage. Dehydration can exacerbate kidney issues, so adequate hydration is essential. Monitor kidney function, avoid NSAIDs, and limit Tylenol  to 2000 mg per  day.   Atherosclerosis of aorta (HCC) Assessment & Plan: Reinforced need for cholesterol management.   Folic acid  deficiency Assessment & Plan: Finish up current supplement then we will recheck        Future Appointments  Date Time Provider Department Center  11/22/2023  8:20 AM Jesus Bernardino MATSU, MD [facility]  11/29/2023  8:00 AM Jesus Bernardino MATSU, MD [facility]  12/06/2023  8:00 AM Jesus Bernardino MATSU, MD [facility]  12/13/2023  8:00 AM Jesus Bernardino MATSU, MD [facility]  12/20/2023  8:00 AM Jesus Bernardino MATSU, MD [facility]  12/26/2023  8:00 AM Jesus Bernardino MATSU, MD [facility]  01/02/2024  8:00 AM Jesus Bernardino MATSU, MD [facility]  01/09/2024  8:00 AM Jesus Bernardino MATSU, MD [facility]  01/16/2024  8:00 AM Jesus Bernardino MATSU, MD [facility]  01/24/2024  8:00 AM Jesus Bernardino MATSU, MD [facility]  01/31/2024  8:00 AM Jesus Bernardino MATSU, MD [facility]  02/07/2024  8:00 AM Jesus Bernardino MATSU, MD [facility]  10/19/2024  8:00 AM Dolphus Reiter, MD [facility] None

## 2023-11-16 NOTE — Progress Notes (Signed)
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 Conus extends to the L1-2 level. Conus and cauda equina appear normal. Paraspinal and other soft tissues: Right renal cysts incidentally noted Disc levels: T12-L1: Bony retropulsion centrally and eccentric to the right off of the superior aspect of L1 is present but the central canal and foramina remain open. L1-2: Negative. L2-3: Mild-to-moderate facet arthropathy and minimal disc bulge without stenosis. L3-4: Shallow disc bulge and mild-to-moderate facet arthropathy. There is some ligamentum flavum thickening. Minimal narrowing in the lateral recesses again seen and there is also mild right foraminal narrowing. The central canal and left foramen open. L4-5: Very shallow disc bulge and mild-to-moderate facet degenerative change. The central canal is open. Mild foraminal narrowing again seen. L5-S1: Shallow disc bulge and moderate facet arthropathy without stenosis. IMPRESSION: 1. Acute to subacute superior endplate compression  fracture of L1 with up to 40% vertebral body height loss. There is some bony retropulsion off the superior aspect of L1 but the central canal and foramina remain open. 2. No change in lumbar spondylosis which is most notable at L3-4 where there is minimal narrowing in the lateral recesses and mild right foraminal narrowing. 3. Negative cervical and thoracic spine MRI scans. 4. Hiatal hernia. Electronically Signed   By: Debby Prader M.D.   On: 10/15/2023 10:11   MR THORACIC SPINE WO CONTRAST Result Date: 10/15/2023 CLINICAL DATA:  Chronic, diffuse spine pain. EXAM: MRI CERVICAL, THORACIC AND LUMBAR SPINE WITHOUT CONTRAST TECHNIQUE: Multiplanar and multiecho pulse sequences of the cervical spine, to include the craniocervical junction and cervicothoracic junction, and thoracic and lumbar spine, were obtained without intravenous contrast. COMPARISON:  MRI thoracic spine 02/23/2023. MRI lumbar spine 10/06/2021. FINDINGS: MRI CERVICAL SPINE FINDINGS Alignment: Normal. Vertebrae: No fracture, evidence of discitis, or worrisome bone lesion. Small, benign hemangioma in C7 is incidentally noted. Cord: Normal signal throughout. Posterior Fossa, vertebral arteries, paraspinal tissues: Negative. Disc levels: The central spinal canal and neural foramina are widely patent at all levels. Very shallow disc bulging is seen at C5-6 and C6-7. MRI THORACIC SPINE FINDINGS Alignment:  Normal. Vertebrae: No fracture, evidence of discitis, or bone lesion. Cord:  Normal signal throughout. Paraspinal and other soft tissues: Hiatal hernia noted. Disc levels: The central spinal canal and neural foramina are patent at all levels with a shallow disc bulge and small annular fissure at T11-12 noted. MRI LUMBAR SPINE FINDINGS Segmentation:  Standard. Alignment:  Trace degenerative retrolisthesis L3 on L4. Vertebrae: The patient has a superior endplate compression fracture of L1 which is new since the comparison thoracic spine MRI. There is  mild marrow edema within the vertebral body. Vertebral body height loss is estimated at up to 40%. There is some bony retropulsion as described below. No other  fracture is identified. No worrisome marrow lesion or evidence of discitis. Conus medullaris and cauda equina: Conus extends to the L1-2 level. Conus and cauda equina appear normal. Paraspinal and other soft tissues: Right renal cysts incidentally noted Disc levels: T12-L1: Bony retropulsion centrally and eccentric to the right off of the superior aspect of L1 is present but the central canal and foramina remain open. L1-2: Negative. L2-3: Mild-to-moderate facet arthropathy and minimal disc bulge without stenosis. L3-4: Shallow disc bulge and mild-to-moderate facet arthropathy. There is some ligamentum flavum thickening. Minimal narrowing in the lateral recesses again seen and there is also mild right foraminal narrowing. The central canal and left foramen open. L4-5: Very shallow disc bulge and mild-to-moderate facet degenerative change. The central canal is open. Mild foraminal narrowing again seen. L5-S1: Shallow disc bulge and moderate facet arthropathy without stenosis. IMPRESSION: 1. Acute to subacute superior endplate compression fracture of L1 with up to 40% vertebral body height loss. There is some bony retropulsion off the superior aspect of L1 but the central canal and foramina remain open. 2. No change in lumbar spondylosis which is most notable at L3-4 where there is minimal narrowing in the lateral recesses and mild right foraminal narrowing. 3. Negative cervical and thoracic spine MRI scans. 4. Hiatal hernia. Electronically Signed   By: Debby Prader M.D.   On: 10/15/2023 10:11   DG Bone Density Result Date: 10/14/2023 Table formatting from the original result was not included. Date of study: 10/14/2023 Exam: DUAL X-RAY ABSORPTIOMETRY (DXA) FOR BONE MINERAL DENSITY (BMD) Instrument: [employer] Requesting Provider: PCP Indication:  screening for low BMD Comparison: none (please note that it is not possible to compare data from different instruments) Clinical data: Pt is a 78 y.o. female with previous history of L1 vertebral compression fracture. On calcitonin, calcium , and vitamin D .  Preparing to start romosozumab . Results:  Lumbar spine L2, L4 (L1, L3) Femoral neck (FN) 33% distal radius T-score -1.1 RFN: -1.1 LFN: - 0.9 n/a Assessment: the BMD is low according to the [facility] classification for osteoporosis (see below). Fracture risk: moderate-high FRAX score: 10 year major osteoporotic risk: 17.1%. 10 year hip fracture risk: 3.2%. The thresholds for treatment are 20% and 3%, respectively. Comments: the technical quality of the study is good, however, L1 vertebra had to be excluded from analysis due to previous compression fracture and L3 vertebra also had to be excluded from analysis due to degenerative changes. Evaluation for secondary causes should be considered if clinically indicated. Recommend optimizing calcium  (1200 mg/day) and vitamin D  (800 IU/day) intake. Followup: Repeat BMD is appropriate after 1 to 2 years. WHO criteria for diagnosis of osteoporosis in postmenopausal women and in men 19 y/o or older: - normal: T-score -1.0 to + 1.0 - osteopenia/low bone density: T-score between -2.5 and -1.0 - osteoporosis: T-score below -2.5 - severe osteoporosis: T-score below -2.5 with history of fragility fracture Note: although not part of the WHO classification, the presence of a fragility fracture, regardless of the T-score, should be considered diagnostic of osteoporosis, provided other causes for the fracture have been excluded. Treatment: The National Osteoporosis Foundation recommends that treatment be considered in postmenopausal women and men age 50 or older with: 1. Hip or vertebral (clinical or morphometric) fracture 2. T-score of - 2.5 or lower at the spine or hip 3. 10-year fracture probability by FRAX of at least 20% for a major  osteoporotic fracture and 3% for a hip fracture Lela Fendt, MD [facility] Endocrinology  MR BREAST BILATERAL W WO CONTRAST INC CAD Result Date: 09/26/2023 CLINICAL DATA:  High-risk screening breast MRI. Strong family history of breast cancer. Patient's sister was diagnosed with breast cancer at the age of 87. History of benign left breast biopsy. EXAM: BILATERAL BREAST MRI WITH AND WITHOUT CONTRAST TECHNIQUE: Multiplanar, multisequence MR images of both breasts were obtained prior to and following the intravenous administration of 6 ml of Vueway  Three-dimensional MR images were rendered by post-processing of the original MR data on an independent workstation. The three-dimensional MR images were interpreted, and findings are reported in the following complete MRI report for this study. Three dimensional images were evaluated at the independent interpreting workstation using the DynaCAD thin client. COMPARISON:  Previous exam(s). FINDINGS: Breast composition: c. Heterogeneous fibroglandular tissue. Background parenchymal enhancement: Minimal Right breast: No mass or abnormal enhancement. Left breast: No mass or abnormal enhancement. Lymph nodes: No abnormal appearing lymph nodes. Ancillary findings:  None. IMPRESSION: No abnormal enhancement in either breast. RECOMMENDATION: Bilateral screening mammogram in August of 2025 is recommended. The American Cancer Society recommends annual MRI and mammography in patients with an estimated lifetime risk of developing breast cancer greater than 20 - 25%, or who are known or suspected to be positive for the breast cancer gene. BI-RADS CATEGORY  1: Negative. Electronically Signed   By: Dina  Arceo M.D.   On: 09/26/2023 14:48   CT RENAL STONE STUDY Result Date: 09/25/2023 CLINICAL DATA:  Left flank pain history of renal stones. EXAM: CT ABDOMEN AND PELVIS WITHOUT CONTRAST TECHNIQUE: Multidetector CT imaging of the abdomen and pelvis was performed following the  standard protocol without IV contrast. RADIATION DOSE REDUCTION: This exam was performed according to the departmental dose-optimization program which includes automated exposure control, adjustment of the mA and/or kV according to patient size and/or use of iterative reconstruction technique. COMPARISON:  MRI September 09 2022 and CT November 01, 2013 FINDINGS: Lower chest: No acute abnormality. Hepatobiliary: Unremarkable noncontrast enhanced appearance of the hepatic parenchyma. Gallbladder is unremarkable. No biliary ductal dilation. Pancreas: No pancreatic ductal dilation or evidence of acute inflammation. Spleen: No splenomegaly. Adrenals/Urinary Tract: Bilateral adrenal glands appear normal. Right renal lesions previously characterized as benign cysts on MRI September 09, 2022 and requiring no independent imaging follow-up per that examination. No hydronephrosis. No renal, ureteral or bladder calculi. Urinary bladder is unremarkable for degree of distension. Stomach/Bowel: Moderate-sized hiatal hernia. No pathologic dilation of small or large bowel. No evidence of acute bowel inflammation. Vascular/Lymphatic: Aortic atherosclerosis. Normal caliber abdominal aorta. Smooth IVC contours. No pathologically enlarged abdominal or pelvic lymph nodes. Reproductive: Status post hysterectomy. No adnexal masses. Other: No significant abdominopelvic free fluid. Probable prior hernia repair in the anterior abdominal wall. Surgical clips posterior to the cecum may reflect prior appendectomy. Musculoskeletal: L1 superior endplate irregularity/compression deformity with 5 mm of bony retropulsion into the canal. IMPRESSION: 1. No hydronephrosis. No renal, ureteral or bladder calculi. 2. Age-indeterminate L1 superior endplate irregularity/compression deformity with 5 mm of bony retropulsion into the canal suggest correlation with point tenderness and consider further evaluation by lumbar spine MRI. 3. Moderate-sized hiatal  hernia. 4.  Aortic Atherosclerosis (ICD10-I70.0). Electronically Signed   By: Reyes Holder M.D.   On: 09/25/2023 10:24  DG Bone Density Result Date: 10/14/2023 Table formatting from the original result was not included. Date of study: 10/14/2023 Exam: DUAL X-RAY ABSORPTIOMETRY (DXA) FOR BONE MINERAL DENSITY (BMD) Instrument: [employer] Requesting Provider: PCP Indication: screening for low BMD Comparison: none (please note that  it is not possible to compare data from different instruments) Clinical data: Pt is a 78 y.o. female with previous history of L1 vertebral compression fracture. On calcitonin, calcium , and vitamin D .  Preparing to start romosozumab . Results:  Lumbar spine L2, L4 (L1, L3) Femoral neck (FN) 33% distal radius T-score -1.1 RFN: -1.1 LFN: - 0.9 n/a Assessment: the BMD is low according to the [facility] classification for osteoporosis (see below). Fracture risk: moderate-high FRAX score: 10 year major osteoporotic risk: 17.1%. 10 year hip fracture risk: 3.2%. The thresholds for treatment are 20% and 3%, respectively. Comments: the technical quality of the study is good, however, L1 vertebra had to be excluded from analysis due to previous compression fracture and L3 vertebra also had to be excluded from analysis due to degenerative changes. Evaluation for secondary causes should be considered if clinically indicated. Recommend optimizing calcium  (1200 mg/day) and vitamin D  (800 IU/day) intake. Followup: Repeat BMD is appropriate after 1 to 2 years. WHO criteria for diagnosis of osteoporosis in postmenopausal women and in men 42 y/o or older: - normal: T-score -1.0 to + 1.0 - osteopenia/low bone density: T-score between -2.5 and -1.0 - osteoporosis: T-score below -2.5 - severe osteoporosis: T-score below -2.5 with history of fragility fracture Note: although not part of the WHO classification, the presence of a fragility fracture, regardless of the T-score, should be considered diagnostic of  osteoporosis, provided other causes for the fracture have been excluded. Treatment: The National Osteoporosis Foundation recommends that treatment be considered in postmenopausal women and men age 23 or older with: 1. Hip or vertebral (clinical or morphometric) fracture 2. T-score of - 2.5 or lower at the spine or hip 3. 10-year fracture probability by FRAX of at least 20% for a major osteoporotic fracture and 3% for a hip fracture Lela Fendt, MD [facility]        Assessment & Plan Closed compression fracture of body of L1 vertebra (HCC) Chronic Back Pain due to Compression Fracture of L1 Chronic back pain is secondary to a compression fracture of L1, worsened by increased physical activity. Current management includes Tylenol  and gabapentin . Pain management options are limited due to kidney issues. Jurnavix was discussed but is not yet available. Tylenol  tolerance is not a concern; increased pain is likely due to overexertion. Risks of NSAIDs and narcotics were discussed, with a preference to avoid narcotics. Pain may improve in 6-18 months but could persist indefinitely. Procedural interventions like cement injection were declined due to potential complications. Increase gabapentin  to 600 mg three times a day and limit Tylenol  to 2000 mg per day. Discuss potential new pain medications when available. Recurrent UTI Recurrent Urinary Tract Infections (UTIs) Recurrent UTIs are likely due to calcium  oxalate crystals in the urine. Increased urination with Kerendia  is reported. Recent urine analysis showed a faint amount of blood, likely from microcrystals. Hydration and dietary modifications are important to manage and prevent kidney stones. Increased fluid intake can help flush out microcrystals and reduce UTI recurrence. A 24-hour urine collection may be needed to adjust medications. Increase fluid intake, provide a dietary modification handout, follow up with a urologist, and consider a  24-hour urine collection if recommended. Rosacea Rosacea Rosacea is managed with topical treatments. More effective treatments were inquired about, and laser treatment was discussed. It is not covered by insurance, requires multiple sessions, and is costly. Consider laser treatment if willing to pay out of pocket. Calcium  oxalate crystals in urine Extensive discussion of need to change diet, and  AVS given. She defers/declines comprehensive workup recommendations today, and has full decision making capacity, but will discuss with urologist more so I put those recommendations in her AVS. Chronic kidney disease, stage 3a (HCC) Stage 3 Chronic Kidney Disease Stage 3 chronic kidney disease with a GFR of 59 requires avoiding NSAIDs and limiting Tylenol  to prevent further kidney damage. Dehydration can exacerbate kidney issues, so adequate hydration is essential. Monitor kidney function, avoid NSAIDs, and limit Tylenol  to 2000 mg per day. Atherosclerosis of aorta (HCC) Reinforced need for cholesterol management. Folic acid  deficiency Finish up current supplement then we will recheck  General Health Maintenance There is a high risk for breast cancer, and yearly mammograms continue. Recent blood work showed good kidney function and thyroid  levels. B6 supplements are recommended to help with kidney stone management. Continue yearly mammograms, maintain a balanced diet, and consider taking B6 supplements.  Follow-up Follow up with a urologist next Thursday and return for a follow-up visit in two weeks. Print out today's notes and dietary handout.     Orders Placed During this Encounter:  No orders of the defined types were placed in this encounter.  Meds ordered this encounter  Medications   gabapentin  (NEURONTIN ) 300 MG capsule    Sig: Take 2 capsules (600 mg total) by mouth 3 (three) times daily.    Dispense:  540 capsule    Refill:  1       This document was synthesized by artificial  intelligence ([employer]) using HIPAA-compliant recording of the clinical interaction;   We discussed the use of AI scribe software for clinical note transcription with the patient, who gave verbal consent to proceed.    Additional Info: This encounter employed state-of-the-art, real-time, collaborative documentation. The patient actively reviewed and assisted in updating their electronic medical record on a shared screen, ensuring transparency and facilitating joint problem-solving for the problem list, overview, and plan. This approach promotes accurate, informed care. The treatment plan was discussed and reviewed in detail, including medication safety, potential side effects, and all patient questions. We confirmed understanding and comfort with the plan. Follow-up instructions were established, including contacting the office for any concerns, returning if symptoms worsen, persist, or new symptoms develop, and precautions for potential emergency department visits.

## 2023-11-17 NOTE — Assessment & Plan Note (Signed)
 Rosacea Rosacea is managed with topical treatments. More effective treatments were inquired about, and laser treatment was discussed. It is not covered by insurance, requires multiple sessions, and is costly. Consider laser treatment if willing to pay out of pocket.

## 2023-11-17 NOTE — Assessment & Plan Note (Signed)
 Reinforced need for cholesterol management.

## 2023-11-17 NOTE — Assessment & Plan Note (Signed)
 Extensive discussion of need to change diet, and AVS given. She defers/declines comprehensive workup recommendations today, and has full decision making capacity, but will discuss with urologist more so I put those recommendations in her AVS.

## 2023-11-17 NOTE — Assessment & Plan Note (Signed)
 Recurrent Urinary Tract Infections (UTIs) Recurrent UTIs are likely due to calcium  oxalate crystals in the urine. Increased urination with Kerendia  is reported. Recent urine analysis showed a faint amount of blood, likely from microcrystals. Hydration and dietary modifications are important to manage and prevent kidney stones. Increased fluid intake can help flush out microcrystals and reduce UTI recurrence. A 24-hour urine collection may be needed to adjust medications. Increase fluid intake, provide a dietary modification handout, follow up with a urologist, and consider a 24-hour urine collection if recommended.

## 2023-11-17 NOTE — Assessment & Plan Note (Signed)
 Stage 3 Chronic Kidney Disease Stage 3 chronic kidney disease with a GFR of 59 requires avoiding NSAIDs and limiting Tylenol  to prevent further kidney damage. Dehydration can exacerbate kidney issues, so adequate hydration is essential. Monitor kidney function, avoid NSAIDs, and limit Tylenol  to 2000 mg per day.

## 2023-11-17 NOTE — Assessment & Plan Note (Signed)
 Finish up current supplement then we will recheck

## 2023-11-21 ENCOUNTER — Ambulatory Visit: Payer: HMO | Admitting: Internal Medicine

## 2023-11-21 MED ORDER — ROMOSOZUMAB-AQQG 105 MG/1.17ML ~~LOC~~ SOSY: 210.0000 mg | PREFILLED_SYRINGE | Freq: Once | SUBCUTANEOUS | Status: AC

## 2023-11-21 NOTE — Telephone Encounter (Signed)
 Please call pt and inform her about OOP cost. If she agrees, then she can have it during 12/06/23 OV.

## 2023-11-21 NOTE — Addendum Note (Signed)
 Addended by: Ariane Ditullio on: 11/21/2023 03:10 PM   Modules accepted: Orders

## 2023-11-22 ENCOUNTER — Ambulatory Visit: Payer: HMO | Admitting: Internal Medicine

## 2023-11-24 ENCOUNTER — Other Ambulatory Visit (HOSPITAL_COMMUNITY): Payer: Self-pay

## 2023-11-24 DIAGNOSIS — N3946 Mixed incontinence: Secondary | ICD-10-CM | POA: Diagnosis not present

## 2023-11-24 DIAGNOSIS — N390 Urinary tract infection, site not specified: Secondary | ICD-10-CM | POA: Diagnosis not present

## 2023-11-24 DIAGNOSIS — R399 Unspecified symptoms and signs involving the genitourinary system: Secondary | ICD-10-CM | POA: Diagnosis not present

## 2023-11-24 DIAGNOSIS — N952 Postmenopausal atrophic vaginitis: Secondary | ICD-10-CM | POA: Diagnosis not present

## 2023-11-24 NOTE — Telephone Encounter (Signed)
Clinical questions answered and PA submitted.

## 2023-11-25 ENCOUNTER — Other Ambulatory Visit: Payer: Self-pay | Admitting: Internal Medicine

## 2023-11-25 DIAGNOSIS — S32010A Wedge compression fracture of first lumbar vertebra, initial encounter for closed fracture: Secondary | ICD-10-CM

## 2023-11-28 ENCOUNTER — Encounter: Payer: Self-pay | Admitting: Internal Medicine

## 2023-11-28 NOTE — Telephone Encounter (Signed)
Pharmacy Patient Advocate Encounter  Received notification from [facility] [employer] that Prior Authorization for Kerendia 20mg  has been DENIED.  See denial reason below. No denial letter attached in CMM. Will attach denial letter to Media tab once received.   PA #/Case ID/Reference #: [id number]

## 2023-11-29 ENCOUNTER — Ambulatory Visit (INDEPENDENT_AMBULATORY_CARE_PROVIDER_SITE_OTHER): Payer: HMO | Admitting: Internal Medicine

## 2023-11-29 ENCOUNTER — Encounter: Payer: Self-pay | Admitting: Internal Medicine

## 2023-11-29 VITALS — BP 104/69 | HR 67 | Temp 98.0°F | Ht 62.0 in | Wt 128.8 lb

## 2023-11-29 DIAGNOSIS — M549 Dorsalgia, unspecified: Secondary | ICD-10-CM | POA: Diagnosis not present

## 2023-11-29 DIAGNOSIS — N390 Urinary tract infection, site not specified: Secondary | ICD-10-CM

## 2023-11-29 DIAGNOSIS — S32010A Wedge compression fracture of first lumbar vertebra, initial encounter for closed fracture: Secondary | ICD-10-CM

## 2023-11-29 DIAGNOSIS — M4856XD Collapsed vertebra, not elsewhere classified, lumbar region, subsequent encounter for fracture with routine healing: Secondary | ICD-10-CM | POA: Diagnosis not present

## 2023-11-29 DIAGNOSIS — E1122 Type 2 diabetes mellitus with diabetic chronic kidney disease: Secondary | ICD-10-CM

## 2023-11-29 DIAGNOSIS — N952 Postmenopausal atrophic vaginitis: Secondary | ICD-10-CM | POA: Diagnosis not present

## 2023-11-29 DIAGNOSIS — G8929 Other chronic pain: Secondary | ICD-10-CM

## 2023-11-29 MED ORDER — CALCITONIN (SALMON) 200 UNIT/ACT NA SOLN
1.0000 | Freq: Every day | NASAL | 1 refills | Status: DC
Start: 2023-11-29 — End: 2024-07-17

## 2023-11-29 MED ORDER — ESTRADIOL 0.1 MG/GM VA CREA: 1.0000 | TOPICAL_CREAM | VAGINAL | 1 refills | Status: AC

## 2023-11-29 MED ORDER — CRANBERRY EXTRACT 250 MG PO TABS: 1.0000 | ORAL_TABLET | Freq: Every day | ORAL | 3 refills | Status: AC

## 2023-11-29 NOTE — Patient Instructions (Addendum)
Recap of urinary tract infection (UTI) plan: Continue with Trospium 20 mg BID, refills sent Continue with vaginal estrogen cream use. Recommend use every night x 2 weeks, then every other night. Refill sent Advise to increase hydration with water. 2L per day is ideal Recommend cranberry supplements. Can try Ellura or Uqora product Recommend balance complex probiotics Recommend vitamin C 1,000 mg daily with Methenamine AFTER BUT NEVER WITH antibiotics   Methenamine helps prevent urinary tract infections (UTIs) by acting as an **urinary antiseptic** rather than a conventional antibiotic. Here's how it works:    1. **Conversion to Formaldehyde**:      - Methenamine itself is inactive. When it reaches the acidic environment of the urine (pH < 5.5), it hydrolyzes into **formaldehyde** and **ammonia**.      - Formaldehyde is **bactericidal**, meaning it kills bacteria by disrupting their proteins and DNA.    2. **Broad-Spectrum Activity**:      - Unlike antibiotics, which target specific bacterial mechanisms, formaldehyde is a **non-specific antimicrobial agent** that kills a wide range of bacteria. This makes it less likely for bacteria to develop resistance.    3. **Prevention, Not Treatment**:      - Methenamine is mainly used for **prophylaxis (prevention)** in people who have **recurrent UTIs** rather than treating active infections.      - It is often combined with **urinary acidifiers** like ascorbic acid (Vitamin C) or sodium biphosphate to ensure an acidic urine pH for optimal effectiveness.    ### Key Considerations:   - **Ineffective in Alkaline Urine**: If the urine pH is too high (e.g., from certain diets or medications like carbonic anhydrase inhibitors), methenamine won't break down into formaldehyde, reducing its effectiveness.   - **Not for Acute UTIs**: Because it works slowly and requires acidic conditions, it's not useful for treating active infections--antibiotics are preferred  for that.   - **Safe Long-Term Use**: Since it doesn't cause traditional antibiotic resistance, it can be used chronically to prevent recurrent infections.  \
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
## 2023-11-29 NOTE — Assessment & Plan Note (Signed)
Osteoporosis   Awaiting approval for Evenity injections. Currently using calcitonin nasal spray for an acute compression fracture but plans to discontinue. Vitamin D is being taken as part of fracture management. Discussed the cost and benefits of Evenity, and there is willingness to pursue treatment despite high costs. Follow-up on Evenity approval with insurance is needed, and vitamin D levels will be checked. Refills for calcitonin nasal spray and vaginal estrogen cream will be sent if available for free.   Rereferred care to physical therapy due to ongoing pain Continue(s) with vitamin D supplement  Clinical symptoms that can be associated with vitamin D deficiency include bone pain, muscle weakness, and general fatigue Vitamin D levels are affected by dietary choices and sunlight exposure  Lab Results  Component Value Date   VD25OH 35.99 10/31/2023   VD25OH 40.59 06/08/2023   VD25OH 40.43 03/14/2023   VD25OH 15.55 (L) 08/17/2022   Lab Results  Component Value Date   TSH 1.430 10/31/2023   ALKPHOS 94 08/19/2023   PTH 60 03/16/2023   CALCIUM 9.9 10/31/2023   PHOS 4.5 08/17/2022   Lab Results  Component Value Date   CALCIUM 9.9 10/31/2023   CALCIUM 9.6 09/26/2023   CALCIUM 10.0 08/19/2023   CALCIUM 9.7 08/05/2023   CALCIUM 10.1 07/12/2023   DEXA scan was normal but she wants evenity and would prefer to continue calcitonin due to concern(s) the L1 compression fracture(s) is fragility fracture(s). We discussed the pros/cons of continuing with calcitonin nasal Pros and Cons of Continuing Calcitonin Nasal Spray While Awaiting Evenity Approval Pros: Potential Pain Relief: Calcitonin has mild analgesic properties for acute vertebral fractures, though evidence for long-term pain relief is limited. Since the fracture was 6 months ago, any ongoing benefit would likely be small but may still be subjectively helpful. Patient Tolerability: She tolerates it well, which reduces concerns  about side effects. Insurance Consideration: If she is close to meeting her out-of-pocket maximum, cost may not be a concern, making continuation reasonable. Some Bone Protective Effects: While not a first-line treatment for osteoporosis, calcitonin can help slow bone loss, which may be beneficial while waiting for Evenity (romosozumab) approval. Cons: Limited Efficacy: Calcitonin has weak anti-resorptive effects compared to other osteoporosis treatments. No fracture risk reduction benefit has been demonstrated in large studies. Possible Risks with Long-Term Use: Cancer Risk? Some studies suggest a small increased risk of malignancy with prolonged calcitonin use, though this remains controversial. Nasal Irritation: Even if she tolerates it well now, long-term use can sometimes cause irritation or rhinitis. Not Needed if No Significant Osteoporosis: If her DEXA didn't show much osteoporosis, calcitonin may not be necessary, especially if her fracture was more trauma-related than due to poor bone quality. Evenity is Much More Effective: Once approved, Evenity provides both anabolic (bone-building) and anti-resorptive effects, making it a superior option. Continuing calcitonin won't enhance Evenity's effects and could be discontinued at that point. Summary and Recommendation Since she tolerates it well and cost isn't a barrier, continuing calcitonin for now is reasonable, especially if she perceives benefit. However, it offers minimal long-term fracture prevention, and she should discontinue it once Evenity is approved.

## 2023-11-29 NOTE — Assessment & Plan Note (Signed)
Diabetic Proteinuria   Trace protein is present in urine, but insurance requires a microalbumin to creatinine ratio of 30 to cover [place]. Diabetes is well-controlled, with the goal to protect kidneys. Discussed insurance challenges and alternative monitoring strategies. Urine protein levels will be monitored regularly, and [place] samples will be provided as available.

## 2023-11-29 NOTE — Assessment & Plan Note (Signed)
Recurrent Urinary Tract Infection (UTI)   Experiencing recurrent UTIs, recently seen by a urologist and prescribed cefuroxime or cefdinir for seven days. Methenamine with vitamin C will be started twice daily after completing the antibiotic course to prevent recurrence. Vaginal estrogen cream and cranberry supplements are being used. The urologist's plan includes increased hydration, cranberry supplements, probiotics, and vitamin C to maintain acidic urine pH, enhancing methenamine effectiveness. Discussed risks of UTI recurrence and benefits of the comprehensive plan. She prefers this approach and is optimistic about the outcome. A follow-up with the urologist is scheduled in four weeks.
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
## 2023-11-30 ENCOUNTER — Other Ambulatory Visit (HOSPITAL_COMMUNITY): Payer: Self-pay

## 2023-12-06 ENCOUNTER — Ambulatory Visit (INDEPENDENT_AMBULATORY_CARE_PROVIDER_SITE_OTHER): Payer: HMO | Admitting: Internal Medicine

## 2023-12-06 ENCOUNTER — Encounter: Payer: Self-pay | Admitting: Internal Medicine

## 2023-12-06 VITALS — BP 105/72 | HR 71 | Temp 97.8°F | Ht 62.0 in | Wt 128.8 lb

## 2023-12-06 DIAGNOSIS — N390 Urinary tract infection, site not specified: Secondary | ICD-10-CM | POA: Diagnosis not present

## 2023-12-06 DIAGNOSIS — M4856XD Collapsed vertebra, not elsewhere classified, lumbar region, subsequent encounter for fracture with routine healing: Secondary | ICD-10-CM

## 2023-12-06 NOTE — Progress Notes (Signed)
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 Negative. L2-3: Mild-to-moderate facet arthropathy and minimal disc bulge without stenosis. L3-4: Shallow disc bulge and mild-to-moderate facet arthropathy. There is some ligamentum flavum thickening. Minimal narrowing in the lateral recesses again seen and there is also mild right foraminal narrowing. The central canal and left foramen open. L4-5: Very shallow disc bulge and mild-to-moderate facet degenerative change. The central canal is open. Mild foraminal narrowing again seen. L5-S1: Shallow disc bulge and moderate facet arthropathy without stenosis. IMPRESSION: 1. Acute to subacute superior endplate compression fracture of L1 with up to 40% vertebral body height loss. There is some bony retropulsion off the superior aspect of L1 but the central canal and foramina remain open. 2. No change in lumbar spondylosis which is most notable at L3-4 where there is minimal narrowing in the lateral recesses and  mild right foraminal narrowing. 3. Negative cervical and thoracic spine MRI scans. 4. Hiatal hernia. [employer]  MR THORACIC SPINE WO CONTRAST Result Date: 10/15/2023 CLINICAL DATA:  Chronic, diffuse spine pain. EXAM: MRI CERVICAL, THORACIC AND LUMBAR SPINE WITHOUT CONTRAST TECHNIQUE: Multiplanar and multiecho pulse sequences of the cervical spine, to include the craniocervical junction and cervicothoracic junction, and thoracic and lumbar spine, were obtained without intravenous contrast. COMPARISON:  MRI thoracic spine 02/23/2023. MRI lumbar spine 10/06/2021. FINDINGS: MRI CERVICAL SPINE FINDINGS Alignment: Normal. Vertebrae: No fracture, evidence of discitis, or worrisome bone lesion. Small, benign hemangioma in C7 is incidentally noted. Cord: Normal signal throughout. Posterior Fossa, vertebral arteries, paraspinal tissues: Negative. Disc levels: The central spinal canal and neural foramina are widely patent at all levels. Very shallow disc bulging is seen at C5-6 and C6-7. MRI THORACIC SPINE FINDINGS Alignment:  Normal. Vertebrae: No fracture, evidence of discitis, or bone lesion. Cord:  Normal signal throughout. Paraspinal and other soft tissues: Hiatal hernia noted. Disc levels: The central spinal canal and neural foramina are patent at all levels with a shallow disc bulge and small annular fissure at T11-12 noted. MRI LUMBAR SPINE FINDINGS Segmentation:  Standard. Alignment:  Trace degenerative retrolisthesis L3 on L4. Vertebrae: The patient has a superior endplate compression fracture of L1 which is new since the comparison thoracic spine MRI. There is mild marrow edema within the vertebral body. Vertebral body height loss is estimated at up to 40%. There is some bony retropulsion as described below. No other fracture is identified. No worrisome marrow lesion or evidence of discitis. Conus medullaris and cauda equina: Conus extends to the L1-2 level. Conus and cauda equina appear normal. Paraspinal and  other soft tissues: Right renal cysts incidentally noted Disc levels: T12-L1: Bony retropulsion centrally and eccentric to the right off of the superior aspect  of L1 is present but the central canal and foramina remain open. L1-2: Negative. L2-3: Mild-to-moderate facet arthropathy and minimal disc bulge without stenosis. L3-4: Shallow disc bulge and mild-to-moderate facet arthropathy. There is some ligamentum flavum thickening. Minimal narrowing in the lateral recesses again seen and there is also mild right foraminal narrowing. The central canal and left foramen open. L4-5: Very shallow disc bulge and mild-to-moderate facet degenerative change. The central canal is open. Mild foraminal narrowing again seen. L5-S1: Shallow disc bulge and moderate facet arthropathy without stenosis. IMPRESSION: 1. Acute to subacute superior endplate compression fracture of L1 with up to 40% vertebral body height loss. There is some bony retropulsion off the superior aspect of L1 but the central canal and foramina remain open. 2. No change in lumbar spondylosis which is most notable at L3-4 where there is minimal narrowing in the lateral recesses and mild right foraminal narrowing. 3. Negative cervical and thoracic spine MRI scans. 4. Hiatal hernia.  MR BREAST BILATERAL W WO CONTRAST INC CAD Result Date: 09/26/2023 CLINICAL DATA:  High-risk screening breast MRI. Strong family history of breast cancer. Patient's sister was diagnosed with breast cancer at the age of 34. History of benign left breast biopsy. EXAM: BILATERAL BREAST MRI WITH AND WITHOUT CONTRAST TECHNIQUE: Multiplanar, multisequence MR images of both breasts were obtained prior to and following the intravenous administration of 6 ml of [employer] Three-dimensional MR images were rendered by post-processing of the original MR data on an independent workstation. The three-dimensional MR images were interpreted, and findings are reported in the following complete MRI report  for this study. Three dimensional images were evaluated at the independent interpreting workstation using the DynaCAD thin client. COMPARISON:  Previous exam(s). FINDINGS: Breast composition: c. Heterogeneous fibroglandular tissue. Background parenchymal enhancement: Minimal Right breast: No mass or abnormal enhancement. Left breast: No mass or abnormal enhancement. Lymph nodes: No abnormal appearing lymph nodes. Ancillary findings:  None. IMPRESSION: No abnormal enhancement in either breast. RECOMMENDATION: Bilateral screening mammogram in August of 2025 is recommended. The American Cancer Society recommends annual MRI and mammography in patients with an estimated lifetime risk of developing breast cancer greater than 20 - 25%, or who are known or suspected to be positive for the breast cancer gene. BI-RADS CATEGORY  1: Negative. ElCT RENAL STONE STUDY Result Date: 09/25/2023 CLINICAL DATA:  Left flank pain history of renal stones. EXAM: CT ABDOMEN AND PELVIS WITHOUT CONTRAST TECHNIQUE: Multidetector CT imaging of the abdomen and pelvis was performed following the standard protocol without IV contrast. RADIATION DOSE REDUCTION: This exam was performed according to the departmental dose-optimization program which includes automated exposure control, adjustment of the mA and/or kV according to patient size and/or use of iterative reconstruction technique. COMPARISON:  MRI September 09 2022 and CT November 01, 2013 FINDINGS: Lower chest: No acute abnormality. Hepatobiliary: Unremarkable noncontrast enhanced appearance of the hepatic parenchyma. Gallbladder is unremarkable. No biliary ductal dilation. Pancreas: No pancreatic ductal dilation or evidence of acute inflammation. Spleen: No splenomegaly. Adrenals/Urinary Tract: Bilateral adrenal glands appear normal. Right renal lesions previously characterized as benign cysts on MRI September 09, 2022 and requiring no independent imaging follow-up per that examination. No  hydronephrosis. No renal, ureteral or bladder calculi. Urinary bladder is unremarkable for degree of distension. Stomach/Bowel: Moderate-sized hiatal hernia. No pathologic dilation of small or large bowel. No evidence of acute bowel inflammation. Vascular/Lymphatic: Aortic atherosclerosis. Normal caliber abdominal aorta. Smooth IVC contours. No pathologically enlarged abdominal or pelvic lymph nodes. Reproductive: Status post hysterectomy. No  adnexal masses. Other: No significant abdominopelvic free fluid. Probable prior hernia repair in the anterior abdominal wall. Surgical clips posterior to the cecum may reflect prior appendectomy. Musculoskeletal: L1 superior endplate irregularity/compression deformity with 5 mm of bony retropulsion into the canal. IMPRESSION: 1. No hydronephrosis. No renal, ureteral or bladder calculi. 2. Age-indeterminate L1 superior endplate irregularity/compression deformity with 5 mm of bony retropulsion into the canal suggest correlation with point tenderness and consider further evaluation by lumbar spine MRI. 3. Moderate-sized hiatal hernia. 4.  Aortic Atherosclerosis (ICD10-I70.0). Electronically Signed    DG Bone Density Result Date: 10/14/2023 Table formatting from the original result was not included. Date of study: 10/14/2023 Exam: DUAL X-RAY ABSORPTIOMETRY (DXA) FOR BONE MINERAL DENSITY (BMD) Instrument: [employer] Requesting Provider: PCP Indication: screening for low BMD Comparison: none (please note that it is not possible to compare data from different instruments) Clinical data: Pt is a 78 y.o. female with previous history of L1 vertebral compression fracture. On calcitonin, calcium, and vitamin D.  Preparing to start romosozumab. Results:  Lumbar spine L2, L4 (L1, L3) Femoral neck (FN) 33% distal radius T-score -1.1 RFN: -1.1 LFN: - 0.9 n/a Assessment: the BMD is low according to the [facility] classification for osteoporosis (see below). Fracture risk: moderate-high FRAX  score: 10 year major osteoporotic risk: 17.1%. 10 year hip fracture risk: 3.2%. The thresholds for treatment are 20% and 3%, respectively. Comments: the technical quality of the study is good, however, L1 vertebra had to be excluded from analysis due to previous compression fracture and L3 vertebra also had to be excluded from analysis due to degenerative changes. Evaluation for secondary causes should be considered if clinically indicated. Recommend optimizing calcium (1200 mg/day) and vitamin D (800 IU/day) intake. Followup: Repeat BMD is appropriate after 1 to 2 years. WHO criteria for diagnosis of osteoporosis in postmenopausal women and in men 47 y/o or older: - normal: T-score -1.0 to + 1.0 - osteopenia/low bone density: T-score between -2.5 and -1.0 - osteoporosis: T-score below -2.5 - severe osteoporosis: T-score below -2.5 with history of fragility fracture Note: although not part of the WHO classification, the presence of a fragility fracture, regardless of the T-score, should be considered diagnostic of osteoporosis, provided other causes for the fracture have been excluded. Treatment: The National Osteoporosis Foundation recommends that treatment be considered in postmenopausal women and men age 54 or older with: 1. Hip or vertebral (clinical or morphometric) fracture 2. T-score of - 2.5 or lower at the spine or hip 3. 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 The previous L1 compression fracture worsened due to recent heavy physical activity, causing localized pain at the fracture site. Pain has improved since yesterday. Imaging is unnecessary. Begin physical therapy for pain management and mobility  improvement. Discussed the risks of heavy physical activity and the benefits of physical therapy. Prefers to avoid heavy pain medications. Continue physical therapy. Provide an updated letter for the insurance company summarizing the condition and treatment. Discuss current pain medications: calcitonin, gabapentin, methocarbamol, hydrocodone, and topicals. Advise against heavy bending and pulling activities. Urinary tract infection without hematuria, site unspecified Urinary Tract Infection (UTI) Completed antibiotics for chronic/recurrent UTI from urologyand started new medication with vitamin  C to prevent recurrence. Urinary incontinence has improved. Monitor urinary symptoms. Consider urine analysis next week to confirm infection clearance.   I have personally spent 37 minutes involved in face-to-face and non-face-to-face activities for this patient on the day of the visit. Professional time spent includes the following activities: Preparing to see the patient (review of tests), Obtaining and/or reviewing separately obtained history (admission/discharge record), Performing a medically appropriate examination and/or evaluation , Ordering medications/tests/procedures, referring and communicating with other health care professionals, Documenting clinical information in the EMR, Independently interpreting results (not separately reported), Communicating results to the patient/family/caregiver, Counseling and educating the patient/family/caregiver and Care coordination (not separately reported).   Almost all the time was spent creating a document letter for insurance purposes that accurately describes her course of medical care since 07/12/23 letter for same.   General Health Maintenance Vitals and weight are well-managed. No new health maintenance issues. Check the status of Evenity (osteoporosis medication) with the front desk.  Follow-up Schedule next week's appointment. Consider urine analysis next  week to confirm UTI clearance.   This document was synthesized by artificial intelligence ([employer]) using HIPAA-compliant recording of the clinical interaction;   We discussed the use of AI scribe software for clinical note transcription with the patient, who gave verbal consent to proceed.    Additional Info: This encounter employed state-of-the-art, real-time, collaborative documentation. The patient actively reviewed and assisted in updating their electronic medical record on a shared screen, ensuring transparency and facilitating joint problem-solving for the problem list, overview, and plan. This approach promotes accurate, informed care. The treatment plan was discussed and reviewed in detail, including medication safety, potential side effects, and all patient questions. We confirmed understanding and comfort with the plan. Follow-up instructions were established, including contacting the office for any concerns, returning if symptoms worsen, persist, or new symptoms develop, and precautions for potential emergency department visits.

## 2023-12-06 NOTE — Assessment & Plan Note (Signed)
 Compression Fracture of L1 - most of today spent writing a letter to comprehensively explain how it has medically impacted her- see letter. The previous L1 compression fracture worsened due to recent heavy physical activity, causing localized pain at the fracture site. Pain has improved since yesterday. Imaging is unnecessary. Begin physical therapy for pain management and mobility improvement. Discussed the risks of heavy physical activity and the benefits of physical therapy. Prefers to avoid heavy pain medications. Continue physical therapy. Provide an updated letter for the insurance company summarizing the condition and treatment. Discuss current pain medications: calcitonin, gabapentin, methocarbamol, hydrocodone, and topicals. Advise against heavy bending and pulling activities.

## 2023-12-06 NOTE — Patient Instructions (Signed)
 VISIT SUMMARY:  Today, we discussed your back pain, which worsened after physical exertion, and your recent urinary tract infection. We reviewed your current medications and made a plan to help manage your pain and prevent future infections.  YOUR PLAN:  -COMPRESSION FRACTURE OF L1: A compression fracture is a type of fracture in the spine that can cause significant pain, especially after heavy physical activity. Your pain has improved since yesterday. We will start physical therapy to help manage your pain and improve mobility. It's important to avoid heavy bending and pulling activities. We will continue with your current pain medications and provide an updated letter for your insurance company.  -URINARY TRACT INFECTION (UTI): A urinary tract infection is an infection in any part of the urinary system. You have completed your antibiotics and started a new medication with vitamin C to prevent recurrence. Your urinary incontinence has improved. We will monitor your urinary symptoms and consider a urine analysis next week to confirm the infection has cleared.  -GENERAL HEALTH MAINTENANCE: Your vitals and weight are well-managed. There are no new health maintenance issues. Please check the status of your Evenity (osteoporosis medication) with the front desk.  INSTRUCTIONS:  Please schedule your appointment for next week. We may do a urine analysis next week to confirm that your UTI has cleared.

## 2023-12-07 DIAGNOSIS — M5416 Radiculopathy, lumbar region: Secondary | ICD-10-CM | POA: Diagnosis not present

## 2023-12-07 DIAGNOSIS — S32010A Wedge compression fracture of first lumbar vertebra, initial encounter for closed fracture: Secondary | ICD-10-CM | POA: Diagnosis not present

## 2023-12-07 DIAGNOSIS — M545 Low back pain, unspecified: Secondary | ICD-10-CM | POA: Diagnosis not present

## 2023-12-12 DIAGNOSIS — M5416 Radiculopathy, lumbar region: Secondary | ICD-10-CM | POA: Diagnosis not present

## 2023-12-12 DIAGNOSIS — M545 Low back pain, unspecified: Secondary | ICD-10-CM | POA: Diagnosis not present

## 2023-12-12 DIAGNOSIS — S32010A Wedge compression fracture of first lumbar vertebra, initial encounter for closed fracture: Secondary | ICD-10-CM | POA: Diagnosis not present

## 2023-12-13 ENCOUNTER — Ambulatory Visit (INDEPENDENT_AMBULATORY_CARE_PROVIDER_SITE_OTHER): Payer: HMO | Admitting: Internal Medicine

## 2023-12-13 ENCOUNTER — Encounter: Payer: Self-pay | Admitting: Internal Medicine

## 2023-12-13 VITALS — BP 120/68 | HR 55 | Temp 97.3°F | Wt 127.2 lb

## 2023-12-13 DIAGNOSIS — N3000 Acute cystitis without hematuria: Secondary | ICD-10-CM

## 2023-12-13 DIAGNOSIS — G250 Essential tremor: Secondary | ICD-10-CM

## 2023-12-13 DIAGNOSIS — G8929 Other chronic pain: Secondary | ICD-10-CM | POA: Diagnosis not present

## 2023-12-13 DIAGNOSIS — E1121 Type 2 diabetes mellitus with diabetic nephropathy: Secondary | ICD-10-CM | POA: Diagnosis not present

## 2023-12-13 DIAGNOSIS — M8000XS Age-related osteoporosis with current pathological fracture, unspecified site, sequela: Secondary | ICD-10-CM | POA: Diagnosis not present

## 2023-12-13 DIAGNOSIS — Z7985 Long-term (current) use of injectable non-insulin antidiabetic drugs: Secondary | ICD-10-CM

## 2023-12-13 DIAGNOSIS — I7300 Raynaud's syndrome without gangrene: Secondary | ICD-10-CM | POA: Diagnosis not present

## 2023-12-13 DIAGNOSIS — M549 Dorsalgia, unspecified: Secondary | ICD-10-CM

## 2023-12-13 DIAGNOSIS — G629 Polyneuropathy, unspecified: Secondary | ICD-10-CM

## 2023-12-13 DIAGNOSIS — Z7984 Long term (current) use of oral hypoglycemic drugs: Secondary | ICD-10-CM | POA: Diagnosis not present

## 2023-12-13 MED ORDER — VENLAFAXINE HCL ER 75 MG PO CP24
75.0000 mg | ORAL_CAPSULE | Freq: Every day | ORAL | 3 refills | Status: AC
Start: 2023-12-13 — End: ?

## 2023-12-13 MED ORDER — KERENDIA 20 MG PO TABS: 20.0000 mg | ORAL_TABLET | Freq: Every day | ORAL | 1 refills | Status: DC

## 2023-12-13 NOTE — Progress Notes (Signed)
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 discitis. Conus medullaris and cauda equina: Conus extends to the L1-2 level. Conus and cauda equina appear normal. Paraspinal and other soft tissues: Right renal cysts incidentally noted Disc levels: T12-L1: Bony retropulsion centrally and eccentric to the right off of the superior aspect of L1 is present but the central canal and foramina remain open. L1-2: Negative. L2-3: Mild-to-moderate facet arthropathy and minimal disc bulge without stenosis. L3-4: Shallow disc bulge and mild-to-moderate facet arthropathy. There is some ligamentum flavum thickening. Minimal narrowing in the lateral recesses again seen and there is also mild right foraminal narrowing. The central canal and left foramen open. L4-5: Very shallow disc bulge and mild-to-moderate facet degenerative change. The central canal is open. Mild foraminal narrowing again seen. L5-S1: Shallow disc bulge and moderate  facet arthropathy without stenosis. IMPRESSION: 1. Acute to subacute superior endplate compression fracture of L1 with up to 40% vertebral body height loss. There is some bony retropulsion off the superior aspect of L1 but the central canal and foramina remain open. 2. No change in lumbar spondylosis which is most notable at L3-4 where there is minimal narrowing in the lateral recesses and mild right foraminal narrowing. 3. Negative cervical and thoracic spine MRI scans. 4. Hiatal hernia. Electronically Signed   By: Drusilla Kanner M.D.   On: 10/15/2023 10:11   MR THORACIC SPINE WO CONTRAST Result Date: 10/15/2023 CLINICAL DATA:  Chronic, diffuse spine pain. EXAM: MRI CERVICAL, THORACIC AND LUMBAR SPINE WITHOUT CONTRAST TECHNIQUE: Multiplanar and multiecho pulse sequences of the cervical spine, to include the craniocervical junction and cervicothoracic junction, and thoracic and lumbar spine, were obtained without intravenous contrast. COMPARISON:  MRI thoracic spine 02/23/2023. MRI lumbar spine 10/06/2021. FINDINGS: MRI CERVICAL SPINE FINDINGS Alignment: Normal. Vertebrae: No fracture, evidence of discitis, or worrisome bone lesion. Small, benign hemangioma in C7 is incidentally noted. Cord: Normal signal throughout. Posterior Fossa, vertebral arteries, paraspinal tissues: Negative. Disc levels: The central spinal canal and neural foramina are widely patent at all levels. Very shallow disc bulging is seen at C5-6 and C6-7. MRI THORACIC SPINE FINDINGS Alignment:  Normal. Vertebrae: No fracture, evidence of discitis, or bone lesion. Cord:  Normal signal throughout. Paraspinal and other soft tissues: Hiatal hernia noted. Disc levels: The central spinal canal and neural foramina are patent at all levels with a shallow disc bulge and small annular fissure at T11-12 noted. MRI LUMBAR SPINE FINDINGS Segmentation:  Standard. Alignment:  Trace degenerative retrolisthesis L3 on L4. Vertebrae: The patient has a superior  endplate compression fracture of L1 which is new since the comparison thoracic spine MRI. There is mild marrow edema within the vertebral body. Vertebral body height loss is  estimated at up to 40%. There is some bony retropulsion as described below. No other fracture is identified. No worrisome marrow lesion or evidence of discitis. Conus medullaris and cauda equina: Conus extends to the L1-2 level. Conus and cauda equina appear normal. Paraspinal and other soft tissues: Right renal cysts incidentally noted Disc levels: T12-L1: Bony retropulsion centrally and eccentric to the right off of the superior aspect of L1 is present but the central canal and foramina remain open. L1-2: Negative. L2-3: Mild-to-moderate facet arthropathy and minimal disc bulge without stenosis. L3-4: Shallow disc bulge and mild-to-moderate facet arthropathy. There is some ligamentum flavum thickening. Minimal narrowing in the lateral recesses again seen and there is also mild right foraminal narrowing. The central canal and left foramen open. L4-5: Very shallow disc bulge and mild-to-moderate facet degenerative change. The central canal is open. Mild foraminal narrowing again seen. L5-S1: Shallow disc bulge and moderate facet arthropathy without stenosis. IMPRESSION: 1. Acute to subacute superior endplate compression fracture of L1 with up to 40% vertebral body height loss. There is some bony retropulsion off the superior aspect of L1 but the central canal and foramina remain open. 2. No change in lumbar spondylosis which is most notable at L3-4 where there is minimal narrowing in the lateral recesses and mild right foraminal narrowing. 3. Negative cervical and thoracic spine MRI scans. 4. Hiatal hernia. Electronically Signed   By: Drusilla Kanner M.D.   On: 10/15/2023 10:11   DG Bone Density Result Date: 10/14/2023 Table formatting from the original result was not included. Date of study: 10/14/2023 Exam: DUAL X-RAY ABSORPTIOMETRY (DXA) FOR  BONE MINERAL DENSITY (BMD) Instrument: [employer] Requesting Provider: PCP Indication: screening for low BMD Comparison: none (please note that it is not possible to compare data from different instruments) Clinical data: Pt is a 78 y.o. female with previous history of L1 vertebral compression fracture. On calcitonin, calcium, and vitamin D.  Preparing to start romosozumab. Results:  Lumbar spine L2, L4 (L1, L3) Femoral neck (FN) 33% distal radius T-score -1.1 RFN: -1.1 LFN: - 0.9 n/a Assessment: the BMD is low according to the [facility] classification for osteoporosis (see below). Fracture risk: moderate-high FRAX score: 10 year major osteoporotic risk: 17.1%. 10 year hip fracture risk: 3.2%. The thresholds for treatment are 20% and 3%, respectively. Comments: the technical quality of the study is good, however, L1 vertebra had to be excluded from analysis due to previous compression fracture and L3 vertebra also had to be excluded from analysis due to degenerative changes. Evaluation for secondary causes should be considered if clinically indicated. Recommend optimizing calcium (1200 mg/day) and vitamin D (800 IU/day) intake. Followup: Repeat BMD is appropriate after 1 to 2 years. WHO criteria for diagnosis of osteoporosis in postmenopausal women and in men 33 y/o or older: - normal: T-score -1.0 to + 1.0 - osteopenia/low bone density: T-score between -2.5 and -1.0 - osteoporosis: T-score below -2.5 - severe osteoporosis: T-score below -2.5 with history of fragility fracture Note: although not part of the WHO classification, the presence of a fragility fracture, regardless of the T-score, should be considered diagnostic of osteoporosis, provided other causes for the fracture have been excluded. Treatment: The National Osteoporosis Foundation recommends that treatment be considered in postmenopausal women and men age 105 or older with: 1. Hip or vertebral (clinical or morphometric) fracture 2. T-score of - 2.5 or  lower at the spine or hip 3. 10-year fracture probability by FRAX of at least 20% for a major  osteoporotic fracture and 3% for a hip fracture Carlus Pavlov, MD [facility]    MR BREAST BILATERAL W WO CONTRAST INC CAD Result Date: 09/26/2023 CLINICAL DATA:  High-risk screening breast MRI. Strong family history of breast cancer. Patient's sister was diagnosed with breast cancer at the age of 31. History of benign left breast biopsy. EXAM: BILATERAL BREAST MRI WITH AND WITHOUT CONTRAST TECHNIQUE: Multiplanar, multisequence MR images of both breasts were obtained prior to and following the intravenous administration of 6 ml of [employer]way Three-dimensional MR images were rendered by post-processing of the original MR data on an independent workstation. The three-dimensional MR images were interpreted, and findings are reported in the following complete MRI report for this study. Three dimensional images were evaluated at the independent interpreting workstation using the DynaCAD thin client. COMPARISON:  Previous exam(s). FINDINGS: Breast composition: c. Heterogeneous fibroglandular tissue. Background parenchymal enhancement: Minimal Right breast: No mass or abnormal enhancement. Left breast: No mass or abnormal enhancement. Lymph nodes: No abnormal appearing lymph nodes. Ancillary findings:  None. IMPRESSION: No abnormal enhancement in either breast. RECOMMENDATION: Bilateral screening mammogram in August of 2025 is recommended. The American Cancer Society recommends annual MRI and mammography in patients with an estimated lifetime risk of developing breast cancer greater than 20 - 25%, or who are known or suspected to be positive for the breast cancer gene. BI-RADS CATEGORY  1: Negative. Electronically Signed   By: Baird Lyons M.D.   On: 09/26/2023 14:48   CT RENAL STONE STUDY Result Date: 09/25/2023 CLINICAL DATA:  Left flank pain history of renal stones. EXAM: CT ABDOMEN AND PELVIS WITHOUT CONTRAST  TECHNIQUE: Multidetector CT imaging of the abdomen and pelvis was performed following the standard protocol without IV contrast. RADIATION DOSE REDUCTION: This exam was performed according to the departmental dose-optimization program which includes automated exposure control, adjustment of the mA and/or kV according to patient size and/or use of iterative reconstruction technique. COMPARISON:  MRI September 09 2022 and CT November 01, 2013 FINDINGS: Lower chest: No acute abnormality. Hepatobiliary: Unremarkable noncontrast enhanced appearance of the hepatic parenchyma. Gallbladder is unremarkable. No biliary ductal dilation. Pancreas: No pancreatic ductal dilation or evidence of acute inflammation. Spleen: No splenomegaly. Adrenals/Urinary Tract: Bilateral adrenal glands appear normal. Right renal lesions previously characterized as benign cysts on MRI September 09, 2022 and requiring no independent imaging follow-up per that examination. No hydronephrosis. No renal, ureteral or bladder calculi. Urinary bladder is unremarkable for degree of distension. Stomach/Bowel: Moderate-sized hiatal hernia. No pathologic dilation of small or large bowel. No evidence of acute bowel inflammation. Vascular/Lymphatic: Aortic atherosclerosis. Normal caliber abdominal aorta. Smooth IVC contours. No pathologically enlarged abdominal or pelvic lymph nodes. Reproductive: Status post hysterectomy. No adnexal masses. Other: No significant abdominopelvic free fluid. Probable prior hernia repair in the anterior abdominal wall. Surgical clips posterior to the cecum may reflect prior appendectomy. Musculoskeletal: L1 superior endplate irregularity/compression deformity with 5 mm of bony retropulsion into the canal. IMPRESSION: 1. No hydronephrosis. No renal, ureteral or bladder calculi. 2. Age-indeterminate L1 superior endplate irregularity/compression deformity with 5 mm of bony retropulsion into the canal suggest correlation with point  tenderness and consider further evaluation by lumbar spine MRI. 3. Moderate-sized hiatal hernia. 4.  Aortic Atherosclerosis (ICD10-I70.0). Electronically Signed   By: Maudry Mayhew M.D.   On: 09/25/2023 10:24       Assessment & Plan Acute cystitis without hematuria She has a recurrent UTI with a history of E. Coli infection. Currently, she is not on antibiotics  but is taking other medications to manage the condition. There is concern about the potential for bacteria to evolve into a stronger strain with repeated antibiotic use. She reports minimal bladder leakage, correlating with infection severity. A urine culture will be obtained to assess the current bacterial strain and infection status. Emphasis is placed on aggressive adherence to urology recommendations to prevent kidney injury. The plan includes obtaining a urine sample for culture and urinalysis, continuing current medications for UTI management, monitoring for bladder leakage and infection symptoms, and encouraging fluid intake to protect kidneys and flush out the UTI. Neuropathy  Diabetic nephropathy with proteinuria (HCC) She has diabetic kidney damage with proteinuria. Efforts are being made to get [place] approved for treatment, but insurance issues are present due to insufficient protein levels in the urine. She is on multiple medications to manage diabetes and protect kidney function, including Mounjaro and potentially Jardiance if the UTI is controlled. Financial challenges of Chauncey Mann and the importance of monitoring proteinuria levels were discussed. The plan is to attempt to get Chauncey Mann approved using a new ICD code, continue Mounjaro 5.0 mg, hold Jardiance for now due to UTI, and encourage fluid intake to protect kidneys and flush out the UTI. Chronic back pain greater than 3 months duration Chronic back pain is likely due to previous bone damage and posture issues. She is undergoing physical therapy, which is painful but  necessary for improvement. Effexor is being used for pain management, with the dose increased to 75 mg to better manage the pain. There is a potential benefit of using narcotics post-physical therapy for severe pain, with caution advised regarding driving. The plan is to increase Effexor to 75 mg daily, continue physical therapy as scheduled, and consider narcotics post-therapy if pain is severe, avoiding driving after taking them. Age-related osteoporosis with current pathological fracture, sequela Evenity is being considered for bone strength, but insurance approval is pending. Alternative medications like Tymlos were discussed if Sheilah Mins is not approved. Calcitonin nasal spray is being used for back pain and bone health. Financial implications of Evenity and the potential need to explore other options if not approved were discussed. The plan is to await insurance approval for Evenity, continue calcitonin nasal spray if free, and consider alternative medications like Tymlos if Evenity is not approved. Essential tremor She has an unusual tremor affecting the right hand, particularly the pinky, ring, and middle fingers. The tremor is not typical of essential tremor, and a neurologist evaluation is recommended to reassess the condition. The plan is to refer her to a neurologist for tremor evaluation. Raynaud's disease without gangrene She experiences Raynaud's phenomenon, particularly affecting the fingers, exacerbated by cold weather, leading to significant blanching of the fingers. The plan is to use protective gloves and cotton finger covers to manage symptoms.       Orders Placed During this Encounter:   Orders Placed This Encounter  Procedures   Urinalysis w microscopic + reflex cultur   Meds ordered this encounter  Medications   venlafaxine XR (EFFEXOR XR) 75 MG 24 hr capsule    Sig: Take 1 capsule (75 mg total) by mouth daily with breakfast.    Dispense:  90 capsule    Refill:  3    Finerenone (KERENDIA) 20 MG TABS    Sig: Take 1 tablet (20 mg total) by mouth daily.    Dispense:  90 tablet    Refill:  1       **This document was synthesized by artificial intelligence ([employer]bridge)  using HIPAA-compliant recording of the clinical interaction;   We discussed the use of AI scribe software for clinical note transcription with the patient, who gave verbal consent to proceed.    Additional Info: This encounter employed state-of-the-art, real-time, collaborative documentation. The patient actively reviewed and assisted in updating their electronic medical record on a shared screen, ensuring transparency and facilitating joint problem-solving for the problem list, overview, and plan. This approach promotes accurate, informed care. The treatment plan was discussed and reviewed in detail, including medication safety, potential side effects, and all patient questions. We confirmed understanding and comfort with the plan. Follow-up instructions were established, including contacting the office for any concerns, returning if symptoms worsen, persist, or new symptoms develop, and precautions for potential emergency department visits.

## 2023-12-13 NOTE — Assessment & Plan Note (Signed)
 Chronic back pain is likely due to previous bone damage and posture issues. She is undergoing physical therapy, which is painful but necessary for improvement. Effexor is being used for pain management, with the dose increased to 75 mg to better manage the pain. There is a potential benefit of using narcotics post-physical therapy for severe pain, with caution advised regarding driving. The plan is to increase Effexor to 75 mg daily, continue physical therapy as scheduled, and consider narcotics post-therapy if pain is severe, avoiding driving after taking them.

## 2023-12-13 NOTE — Assessment & Plan Note (Signed)
 She has an unusual tremor affecting the right hand, particularly the pinky, ring, and middle fingers. The tremor is not typical of essential tremor, and a neurologist evaluation is recommended to reassess the condition. The plan is to refer her to a neurologist for tremor evaluation.

## 2023-12-13 NOTE — Patient Instructions (Addendum)
 VISIT SUMMARY:  Today, we discussed your chronic back pain, recurrent urinary tract infections, diabetic kidney disease, osteoporosis, essential tremor, and Raynaud's phenomenon. We reviewed your current treatments and made some adjustments to better manage your symptoms and improve your overall health.  YOUR PLAN:  -CHRONIC BACK PAIN: Chronic back pain is likely due to previous bone damage and posture issues. We will increase your Effexor dose to 75 mg daily to better manage the pain. Continue with your physical therapy as scheduled, and if the pain becomes severe after therapy, you may consider using narcotics but avoid driving after taking them.  -URINARY TRACT INFECTION (UTI): You have a history of recurrent UTIs. We will obtain a urine sample for culture and urinalysis to assess the current bacterial strain. Continue your current medications for UTI management, monitor for bladder leakage and infection symptoms, and drink plenty of fluids to protect your kidneys and help flush out the infection.  -DIABETIC KIDNEY DISEASE: Diabetic kidney disease involves damage to the kidneys due to diabetes. We will attempt to get Chauncey Mann approved using a new ICD code. Continue taking Mounjaro 5.0 mg, and we will hold off on Jardiance for now due to your UTI. Drink plenty of fluids to protect your kidneys.  -OSTEOPOROSIS: Osteoporosis is a condition where bones become weak and brittle. We are awaiting insurance approval for [employer]. Continue using the calcitonin nasal spray if it is free, and we may consider alternative medications like Tymlos if Evenity is not approved.  -ESSENTIAL TREMOR: Essential tremor is a nervous system disorder that causes involuntary shaking. Your tremor is unusual, particularly affecting your right hand. We recommend seeing a neurologist for further evaluation.  -RAYNAUD'S PHENOMENON: Raynaud's phenomenon causes some areas of your body, like your fingers, to feel numb and cold in  response to cold temperatures or stress. Use protective gloves and cotton finger covers to manage your symptoms.  INSTRUCTIONS:  Please follow up with a neurologist for your tremor evaluation. Obtain a urine sample for culture and urinalysis as discussed. Continue with your current medications and treatments as outlined, and drink plenty of fluids to help manage your conditions.

## 2023-12-13 NOTE — Telephone Encounter (Signed)
 Sent my chart message on 2/21 informing patient.

## 2023-12-13 NOTE — Assessment & Plan Note (Signed)
 Evenity is being considered for bone strength, but insurance approval is pending. Alternative medications like Tymlos were discussed if Laura Farrell is not approved. Calcitonin nasal spray is being used for back pain and bone health. Financial implications of Evenity and the potential need to explore other options if not approved were discussed. The plan is to await insurance approval for Evenity, continue calcitonin nasal spray if free, and consider alternative medications like Tymlos if Evenity is not approved.

## 2023-12-13 NOTE — Assessment & Plan Note (Signed)
 She experiences Raynaud's phenomenon, particularly affecting the fingers, exacerbated by cold weather, leading to significant blanching of the fingers. The plan is to use protective gloves and cotton finger covers to manage symptoms.

## 2023-12-14 DIAGNOSIS — S32010A Wedge compression fracture of first lumbar vertebra, initial encounter for closed fracture: Secondary | ICD-10-CM | POA: Diagnosis not present

## 2023-12-14 DIAGNOSIS — M5416 Radiculopathy, lumbar region: Secondary | ICD-10-CM | POA: Diagnosis not present

## 2023-12-14 DIAGNOSIS — M545 Low back pain, unspecified: Secondary | ICD-10-CM | POA: Diagnosis not present

## 2023-12-15 ENCOUNTER — Encounter: Payer: Self-pay | Admitting: Internal Medicine

## 2023-12-15 LAB — URINALYSIS W MICROSCOPIC + REFLEX CULTURE
Bacteria, UA: NONE SEEN /HPF
Bilirubin Urine: NEGATIVE
Glucose, UA: NEGATIVE
Hgb urine dipstick: NEGATIVE
Nitrites, Initial: NEGATIVE
Protein, ur: NEGATIVE
RBC / HPF: NONE SEEN /HPF (ref 0–2)
Specific Gravity, Urine: 1.026 (ref 1.001–1.035)
pH: <=5 (ref 5.0–8.0)

## 2023-12-15 LAB — URINE CULTURE
MICRO NUMBER:: 16161716
Result:: NO GROWTH
SPECIMEN QUALITY:: ADEQUATE

## 2023-12-15 LAB — CULTURE INDICATED

## 2023-12-19 ENCOUNTER — Other Ambulatory Visit (HOSPITAL_COMMUNITY): Payer: Self-pay

## 2023-12-20 ENCOUNTER — Other Ambulatory Visit (HOSPITAL_COMMUNITY): Payer: Self-pay

## 2023-12-20 ENCOUNTER — Encounter: Payer: Self-pay | Admitting: Internal Medicine

## 2023-12-20 ENCOUNTER — Telehealth: Payer: Self-pay

## 2023-12-20 ENCOUNTER — Other Ambulatory Visit (HOSPITAL_BASED_OUTPATIENT_CLINIC_OR_DEPARTMENT_OTHER): Payer: Self-pay

## 2023-12-20 ENCOUNTER — Ambulatory Visit (INDEPENDENT_AMBULATORY_CARE_PROVIDER_SITE_OTHER): Payer: HMO | Admitting: Internal Medicine

## 2023-12-20 VITALS — BP 117/72 | HR 53 | Temp 98.3°F | Ht 62.0 in | Wt 126.2 lb

## 2023-12-20 DIAGNOSIS — R5383 Other fatigue: Secondary | ICD-10-CM | POA: Diagnosis not present

## 2023-12-20 DIAGNOSIS — M5416 Radiculopathy, lumbar region: Secondary | ICD-10-CM | POA: Diagnosis not present

## 2023-12-20 DIAGNOSIS — M545 Low back pain, unspecified: Secondary | ICD-10-CM | POA: Diagnosis not present

## 2023-12-20 DIAGNOSIS — S32010A Wedge compression fracture of first lumbar vertebra, initial encounter for closed fracture: Secondary | ICD-10-CM | POA: Diagnosis not present

## 2023-12-20 DIAGNOSIS — M4856XD Collapsed vertebra, not elsewhere classified, lumbar region, subsequent encounter for fracture with routine healing: Secondary | ICD-10-CM | POA: Diagnosis not present

## 2023-12-20 LAB — COMPREHENSIVE METABOLIC PANEL
ALT: 13 U/L (ref 0–35)
AST: 16 U/L (ref 0–37)
Albumin: 4.5 g/dL (ref 3.5–5.2)
Alkaline Phosphatase: 53 U/L (ref 39–117)
BUN: 14 mg/dL (ref 6–23)
CO2: 29 meq/L (ref 19–32)
Calcium: 9.9 mg/dL (ref 8.4–10.5)
Chloride: 105 meq/L (ref 96–112)
Creatinine, Ser: 0.83 mg/dL (ref 0.40–1.20)
GFR: 66.77 mL/min (ref >60.00)
Glucose, Bld: 96 mg/dL (ref 70–99)
Potassium: 3.6 meq/L (ref 3.5–5.1)
Sodium: 141 meq/L (ref 135–145)
Total Bilirubin: 0.3 mg/dL (ref 0.2–1.2)
Total Protein: 7.2 g/dL (ref 6.0–8.3)

## 2023-12-20 LAB — URINALYSIS, ROUTINE W REFLEX MICROSCOPIC
Bilirubin Urine: NEGATIVE
Hgb urine dipstick: NEGATIVE
Ketones, ur: NEGATIVE
Leukocytes,Ua: NEGATIVE
Nitrite: NEGATIVE
Specific Gravity, Urine: 1.025 (ref 1.000–1.030)
Total Protein, Urine: NEGATIVE
Urine Glucose: NEGATIVE
Urobilinogen, UA: 0.2 (ref 0.0–1.0)
pH: 6 (ref 5.0–8.0)

## 2023-12-20 LAB — SEDIMENTATION RATE: Sed Rate: 3 mm/h (ref 0–30)

## 2023-12-20 LAB — B12 AND FOLATE PANEL
Folate: >25.2 ng/mL (ref >5.9)
Vitamin B-12: 895 pg/mL (ref 211–911)

## 2023-12-20 NOTE — Assessment & Plan Note (Addendum)
 ASSESSMENT AND PLAN 1. Other Fatigue (R53.83)   Assessment:  Sudden onset severe fatigue beginning yesterday, characterized by lack of energy rather than drowsiness  Multiple potential contributing factors:   - Recent medication changes (Effexor increased to 75 mg, gabapentin dose increase)   - Low heart rate (53 bpm today, 55 bpm last week vs baseline 60-70)   - Possible recurrent UTI   - Normal B12 and folate levels today   - Normal inflammatory markers (ESR 3)   - Laboratory values stable with GFR 66.77, glucose 96  Patient maintains active lifestyle with volunteering and social activities despite comorbidities  Weight has decreased from 133 lbs to 126 lbs over past 2 months   Plan:  Further diagnostic evaluation:   - Complete iron studies (ordered)   - TSH and free T4 (ordered)   - CBC with differential (ordered)  Medication review:   - Consider Effexor dose reduction back to 37.5 mg if fatigue persists beyond 2 weeks   - Monitor for continued gabapentin side effects  Cardiac evaluation:   - Recommend purchasing/using Apple Watch for continuous heart rate monitoring   - Monitor for bradycardia episodes and correlate with fatigue symptoms   - Consider EKG at follow-up if low heart rate persists  Follow up in 1 week to review laboratory results and reassess symptoms  Call immediately for worsening symptoms, especially if fever, dizziness, or shortness of breath develops    2. Recurrent Urinary Tract Infection (N39.0)   Assessment:  History of recurrent UTIs with multiple positive cultures  Today's UA shows elevated WBCs (21-50/hpf), presence of bacteria, renal epithelial cells  Negative for nitrites and leukocyte esterase  Previous culture on 10/24/23 positive for E. coli  Currently on methenamine 1g BID for prophylaxis   Plan:  Continue methenamine prophylaxis  Observe for UTI symptoms (dysuria, frequency, urgency)  Consider urine culture if symptoms develop  Ensure adequate  hydration (at least 64 oz water daily)  Cranberry supplement appears appropriate as patient is on 250mg  daily  Reinforce importance of completed antibiotic courses when active infections are treated    3. Type 2 Diabetes Mellitus with CKD Stage 3 (E11.22)   Assessment:  Well-controlled with Mounjaro (tirzepatide) 7.5 mg weekly  Blood glucose 96 mg/dL today  No hypoglycemic symptoms reported  Recent GFR stable at 66.77 mL/min (improved from previous readings)  On finerenone 20mg  daily for renal protection   Plan:  Continue current diabetes medication regimen  Monitor for medication side effects, particularly with Mounjaro  Continue renal protective therapy  Monitor kidney function with next scheduled labs  Maintain dietary control and regular physical activity as tolerated    4. Age-related Osteoporosis with Current Pathological Fracture (M80.08XA)   Assessment:  Receiving romosozumab ([facility]) injections  Recent DEXA (10/14/2023) shows T-scores: Lumbar spine -1.1, Right femoral neck -1.1, Left femoral neck -0.9  MRI (10/15/2023) shows acute to subacute L1 compression fracture with 40% vertebral body height loss  Currently on calcitonin nasal spray daily  FRAX score indicates 10-year major osteoporotic risk of 17.1% and hip fracture risk 3.2%   Plan:  Continue romosozumab therapy per schedule  Continue calcium supplementation and vitamin D  Maintain calcitonin nasal spray  Monitor for back pain and new skeletal symptoms  Follow up with orthopedics as scheduled for L1 fracture  Repeat DEXA scan in 1 year    5. Chronic Kidney Disease Stage 3a (N18.31)   Assessment:  Stable renal function with GFR 66.77 mL/min  No significant electrolyte  abnormalities  Mild proteinuria noted in previous studies  Patient on appropriate renal protective therapy (finerenone)   Plan:  Continue current medication regimen  Maintain blood pressure control  Monitor renal function with regular laboratory studies   Continue ACE/ARB therapy for renal protection  Avoid nephrotoxic medications    FOLLOW-UP PLAN   Return to clinic in 1 week to review laboratory results and assess fatigue  Continue all current medications unless otherwise specified  Call office for fever > 100.51F, worsening shortness of breath, or severe dizziness  Labs ordered: CBC with differential, Comprehensive metabolic panel, TSH/free T4, Iron studies  Consider cardiac monitoring with personal device (Apple Watch recommended)

## 2023-12-20 NOTE — Progress Notes (Signed)
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 Vertebrae: No fracture, evidence of discitis, or worrisome bone lesion. Small, benign hemangioma in C7 is incidentally noted. Cord: Normal signal throughout. Posterior Fossa, vertebral arteries, paraspinal tissues: Negative. Disc levels: The central spinal canal and neural foramina are widely patent at all levels. Very shallow disc bulging is seen at C5-6 and C6-7. MRI THORACIC SPINE FINDINGS Alignment:  Normal. Vertebrae: No fracture, evidence of discitis, or bone lesion. Cord:  Normal signal throughout. Paraspinal and other soft tissues: Hiatal hernia noted. Disc levels: The central spinal canal and neural foramina are patent at all levels with a shallow disc bulge and small annular fissure at T11-12 noted. MRI LUMBAR SPINE FINDINGS Segmentation:  Standard. Alignment:  Trace degenerative retrolisthesis L3 on L4. Vertebrae: The patient has a superior endplate compression fracture of L1  which is new since the comparison thoracic spine MRI. There is mild marrow edema within the vertebral body. Vertebral body height loss is estimated at up to 40%. There is some bony retropulsion as described below. No other fracture is identified. No worrisome marrow lesion or evidence of discitis. Conus medullaris and cauda equina: Conus extends to the L1-2 level. Conus and cauda equina appear normal. Paraspinal and other soft tissues: Right renal cysts incidentally noted Disc levels: T12-L1: Bony retropulsion centrally and eccentric to the right off of the superior aspect of L1 is present but the central canal and foramina remain open. L1-2: Negative. L2-3: Mild-to-moderate facet arthropathy and minimal disc bulge without stenosis. L3-4: Shallow disc bulge and mild-to-moderate facet arthropathy. There is some ligamentum flavum thickening. Minimal narrowing in the lateral recesses again seen and there is also mild right foraminal narrowing. The central canal and left foramen open. L4-5: Very shallow disc bulge and mild-to-moderate facet degenerative change. The central canal is open. Mild foraminal narrowing again seen. L5-S1: Shallow disc bulge and moderate facet arthropathy without stenosis. IMPRESSION: 1. Acute to subacute superior endplate compression fracture of L1 with up to 40% vertebral body height loss. There is some bony retropulsion off the superior aspect of L1 but the central canal and foramina remain open. 2. No change in lumbar spondylosis which is most notable at L3-4 where there is minimal narrowing in the lateral recesses and mild right foraminal narrowing. 3. Negative cervical and thoracic spine MRI scans. 4. Hiatal hernia. Electronically Signed   By: Drusilla Kanner M.D.   On: 10/15/2023 10:11   MR THORACIC SPINE WO CONTRAST Result Date: 10/15/2023 CLINICAL DATA:  Chronic, diffuse spine pain. EXAM: MRI CERVICAL, THORACIC AND LUMBAR SPINE WITHOUT CONTRAST TECHNIQUE: Multiplanar and multiecho  pulse sequences of the cervical spine, to include the craniocervical junction and cervicothoracic junction, and thoracic and lumbar spine, were obtained without intravenous contrast. COMPARISON:  MRI thoracic spine 02/23/2023. MRI  lumbar spine 10/06/2021. FINDINGS: MRI CERVICAL SPINE FINDINGS Alignment: Normal. Vertebrae: No fracture, evidence of discitis, or worrisome bone lesion. Small, benign hemangioma in C7 is incidentally noted. Cord: Normal signal throughout. Posterior Fossa, vertebral arteries, paraspinal tissues: Negative. Disc levels: The central spinal canal and neural foramina are widely patent at all levels. Very shallow disc bulging is seen at C5-6 and C6-7. MRI THORACIC SPINE FINDINGS Alignment:  Normal. Vertebrae: No fracture, evidence of discitis, or bone lesion. Cord:  Normal signal throughout. Paraspinal and other soft tissues: Hiatal hernia noted. Disc levels: The central spinal canal and neural foramina are patent at all levels with a shallow disc bulge and small annular fissure at T11-12 noted. MRI LUMBAR SPINE FINDINGS Segmentation:  Standard. Alignment:  Trace degenerative retrolisthesis L3 on L4. Vertebrae: The patient has a superior endplate compression fracture of L1 which is new since the comparison thoracic spine MRI. There is mild marrow edema within the vertebral body. Vertebral body height loss is estimated at up to 40%. There is some bony retropulsion as described below. No other fracture is identified. No worrisome marrow lesion or evidence of discitis. Conus medullaris and cauda equina: Conus extends to the L1-2 level. Conus and cauda equina appear normal. Paraspinal and other soft tissues: Right renal cysts incidentally noted Disc levels: T12-L1: Bony retropulsion centrally and eccentric to the right off of the superior aspect of L1 is present but the central canal and foramina remain open. L1-2: Negative. L2-3: Mild-to-moderate facet arthropathy and minimal disc bulge without  stenosis. L3-4: Shallow disc bulge and mild-to-moderate facet arthropathy. There is some ligamentum flavum thickening. Minimal narrowing in the lateral recesses again seen and there is also mild right foraminal narrowing. The central canal and left foramen open. L4-5: Very shallow disc bulge and mild-to-moderate facet degenerative change. The central canal is open. Mild foraminal narrowing again seen. L5-S1: Shallow disc bulge and moderate facet arthropathy without stenosis. IMPRESSION: 1. Acute to subacute superior endplate compression fracture of L1 with up to 40% vertebral body height loss. There is some bony retropulsion off the superior aspect of L1 but the central canal and foramina remain open. 2. No change in lumbar spondylosis which is most notable at L3-4 where there is minimal narrowing in the lateral recesses and mild right foraminal narrowing. 3. Negative cervical and thoracic spine MRI scans. 4. Hiatal hernia. Electronically Signed   By: Drusilla Kanner M.D.   On: 10/15/2023 10:11   DG Bone Density Result Date: 10/14/2023 Table formatting from the original result was not included. Date of study: 10/14/2023 Exam: DUAL X-RAY ABSORPTIOMETRY (DXA) FOR BONE MINERAL DENSITY (BMD) Instrument: [employer] Requesting Provider: PCP Indication: screening for low BMD Comparison: none (please note that it is not possible to compare data from different instruments) Clinical data: Pt is a 78 y.o. female with previous history of L1 vertebral compression fracture. On calcitonin, calcium, and vitamin D.  Preparing to start romosozumab. Results:  Lumbar spine L2, L4 (L1, L3) Femoral neck (FN) 33% distal radius T-score -1.1 RFN: -1.1 LFN: - 0.9 n/a Assessment: the BMD is low according to the [facility] classification for osteoporosis (see below). Fracture risk: moderate-high FRAX score: 10 year major osteoporotic risk: 17.1%. 10 year hip fracture risk: 3.2%. The thresholds for treatment are 20% and 3%, respectively.  Comments: the technical quality of the study is good, however, L1 vertebra had to be excluded from analysis due to previous compression fracture and L3 vertebra also had to be excluded from analysis due  to degenerative changes. Evaluation for secondary causes should be considered if clinically indicated. Recommend optimizing calcium (1200 mg/day) and vitamin D (800 IU/day) intake. Followup: Repeat BMD is appropriate after 1 to 2 years. WHO criteria for diagnosis of osteoporosis in postmenopausal women and in men 73 y/o or older: - normal: T-score -1.0 to + 1.0 - osteopenia/low bone density: T-score between -2.5 and -1.0 - osteoporosis: T-score below -2.5 - severe osteoporosis: T-score below -2.5 with history of fragility fracture Note: although not part of the WHO classification, the presence of a fragility fracture, regardless of the T-score, should be considered diagnostic of osteoporosis, provided other causes for the fracture have been excluded. Treatment: The National Osteoporosis Foundation recommends that treatment be considered in postmenopausal women and men age 74 or older with: 1. Hip or vertebral (clinical or morphometric) fracture 2. T-score of - 2.5 or lower at the spine or hip 3. 10-year fracture probability by FRAX of at least 20% for a major osteoporotic fracture and 3% for a hip fracture Carlus Pavlov, MD [facility]    MR BREAST BILATERAL W WO CONTRAST INC CAD Result Date: 09/26/2023 CLINICAL DATA:  High-risk screening breast MRI. Strong family history of breast cancer. Patient's sister was diagnosed with breast cancer at the age of 57. History of benign left breast biopsy. EXAM: BILATERAL BREAST MRI WITH AND WITHOUT CONTRAST TECHNIQUE: Multiplanar, multisequence MR images of both breasts were obtained prior to and following the intravenous administration of 6 ml of Vueway Three-dimensional MR images were rendered by post-processing of the original MR data on an independent  workstation. The three-dimensional MR images were interpreted, and findings are reported in the following complete MRI report for this study. Three dimensional images were evaluated at the independent interpreting workstation using the DynaCAD thin client. COMPARISON:  Previous exam(s). FINDINGS: Breast composition: c. Heterogeneous fibroglandular tissue. Background parenchymal enhancement: Minimal Right breast: No mass or abnormal enhancement. Left breast: No mass or abnormal enhancement. Lymph nodes: No abnormal appearing lymph nodes. Ancillary findings:  None. IMPRESSION: No abnormal enhancement in either breast. RECOMMENDATION: Bilateral screening mammogram in August of 2025 is recommended. The American Cancer Society recommends annual MRI and mammography in patients with an estimated lifetime risk of developing breast cancer greater than 20 - 25%, or who are known or suspected to be positive for the breast cancer gene. BI-RADS CATEGORY  1: Negative. Electronically Signed   By: Baird Lyons M.D.   On: 09/26/2023 14:48       Assessment & Plan Other fatigue ASSESSMENT AND PLAN 1. Other Fatigue (R53.83)   Assessment:  Sudden onset severe fatigue beginning yesterday, characterized by lack of energy rather than drowsiness  Multiple potential contributing factors:   - Recent medication changes (Effexor increased to 75 mg, gabapentin dose increase)   - Low heart rate (53 bpm today, 55 bpm last week vs baseline 60-70)   - Possible recurrent UTI   - Normal B12 and folate levels today   - Normal inflammatory markers (ESR 3)   - Laboratory values stable with GFR 66.77, glucose 96  Patient maintains active lifestyle with volunteering and social activities despite comorbidities  Weight has decreased from 133 lbs to 126 lbs over past 2 months   Plan:  Further diagnostic evaluation:   - Complete iron studies (ordered)   - TSH and free T4 (ordered)   - CBC with differential (ordered)  Medication review:    - Consider Effexor dose reduction back to 37.5 mg if fatigue persists  beyond 2 weeks   - Monitor for continued gabapentin side effects  Cardiac evaluation:   - Recommend purchasing/using Apple Watch for continuous heart rate monitoring   - Monitor for bradycardia episodes and correlate with fatigue symptoms   - Consider EKG at follow-up if low heart rate persists  Follow up in 1 week to review laboratory results and reassess symptoms  Call immediately for worsening symptoms, especially if fever, dizziness, or shortness of breath develops    2. Recurrent Urinary Tract Infection (N39.0)   Assessment:  History of recurrent UTIs with multiple positive cultures  Today's UA shows elevated WBCs (21-50/hpf), presence of bacteria, renal epithelial cells  Negative for nitrites and leukocyte esterase  Previous culture on 10/24/23 positive for E. coli  Currently on methenamine 1g BID for prophylaxis   Plan:  Continue methenamine prophylaxis  Observe for UTI symptoms (dysuria, frequency, urgency)  Consider urine culture if symptoms develop  Ensure adequate hydration (at least 64 oz water daily)  Cranberry supplement appears appropriate as patient is on 250mg  daily  Reinforce importance of completed antibiotic courses when active infections are treated    3. Type 2 Diabetes Mellitus with CKD Stage 3 (E11.22)   Assessment:  Well-controlled with Mounjaro (tirzepatide) 7.5 mg weekly  Blood glucose 96 mg/dL today  No hypoglycemic symptoms reported  Recent GFR stable at 66.77 mL/min (improved from previous readings)  On finerenone 20mg  daily for renal protection   Plan:  Continue current diabetes medication regimen  Monitor for medication side effects, particularly with Mounjaro  Continue renal protective therapy  Monitor kidney function with next scheduled labs  Maintain dietary control and regular physical activity as tolerated    4. Age-related Osteoporosis with Current Pathological Fracture  (M80.08XA)   Assessment:  Receiving romosozumab (Evenity) injections  Recent DEXA (10/14/2023) shows T-scores: Lumbar spine -1.1, Right femoral neck -1.1, Left femoral neck -0.9  MRI (10/15/2023) shows acute to subacute L1 compression fracture with 40% vertebral body height loss  Currently on calcitonin nasal spray daily  FRAX score indicates 10-year major osteoporotic risk of 17.1% and hip fracture risk 3.2%   Plan:  Continue romosozumab therapy per schedule  Continue calcium supplementation and vitamin D  Maintain calcitonin nasal spray  Monitor for back pain and new skeletal symptoms  Follow up with orthopedics as scheduled for L1 fracture  Repeat DEXA scan in 1 year    5. Chronic Kidney Disease Stage 3a (N18.31)   Assessment:  Stable renal function with GFR 66.77 mL/min  No significant electrolyte abnormalities  Mild proteinuria noted in previous studies  Patient on appropriate renal protective therapy (finerenone)   Plan:  Continue current medication regimen  Maintain blood pressure control  Monitor renal function with regular laboratory studies  Continue ACE/ARB therapy for renal protection  Avoid nephrotoxic medications    FOLLOW-UP PLAN   Return to clinic in 1 week to review laboratory results and assess fatigue  Continue all current medications unless otherwise specified  Call office for fever > 100.56F, worsening shortness of breath, or severe dizziness  Labs ordered: CBC with differential, Comprehensive metabolic panel, TSH/free T4, Iron studies  Consider cardiac monitoring with personal device (Apple Watch recommended)         Orders Placed During this Encounter:   Orders Placed This Encounter  Procedures   B12 and Folate Panel   Iron, TIBC and Ferritin Panel   TSH + free T4   CBC with Differential/Platelet    Standing Status:   Future  Expiration Date:   12/19/2024   Urinalysis, Routine w reflex microscopic   Comp Met (CMET)   Sedimentation rate    **This  document was synthesized by artificial intelligence (Abridge) using HIPAA-compliant recording of the clinical interaction;   We discussed the use of AI scribe software for clinical note transcription with the patient, who gave verbal consent to proceed.    Additional Info: This encounter employed state-of-the-art, real-time, collaborative documentation. The patient actively reviewed and assisted in updating their electronic medical record on a shared screen, ensuring transparency and facilitating joint problem-solving for the problem list, overview, and plan. This approach promotes accurate, informed care. The treatment plan was discussed and reviewed in detail, including medication safety, potential side effects, and all patient questions. We confirmed understanding and comfort with the plan. Follow-up instructions were established, including contacting the office for any concerns, returning if symptoms worsen, persist, or new symptoms develop, and precautions for potential emergency department visits.   MEDICAL DECISION MAKING ATTESTATION Level of MDM: Moderate Complexity    Number and Complexity of Problems Addressed   Today's encounter involved multiple established chronic illnesses with progression of symptoms requiring evaluation of a new problem of sudden-onset severe fatigue in the context of:    Type 2 diabetes mellitus with chronic kidney disease (stable)  Age-related osteoporosis with recent compression fracture (stable)  Recurrent urinary tract infections (active/ongoing)  Essential tremor (stable)  Multiple other chronic conditions   The new problem of severe fatigue required comprehensive assessment in this medically complex patient with consideration of multiple potential etiologies, including medication effects, cardiovascular issues, infectious causes, and exacerbation of chronic conditions.    Amount and/or Complexity of Data Reviewed and Analyzed   I performed review and  analysis of a moderate amount of complex data today, including:    Comprehensive review of laboratory results from today and multiple prior visits spanning the past 3 months  Review of current and recent urinalysis with significant abnormalities  Assessment of vital sign trends, particularly the heart rate pattern showing significant bradycardia  Analysis of weight trend showing 7-pound weight loss over past 2 months  Review of recent imaging studies, including MRIs of cervical, thoracic, and lumbar spine from January 2025  Review of bone density study results from January 2025  Assessment of medication profile with multiple recent changes    Risk of Complications and/or Morbidity or Mortality   The risk associated with today's presenting problem is moderate, considering:    Patient's advanced age (78 years) with multiple comorbidities increasing risk profile  Recent compression fracture of L1 vertebra with 40% height loss requiring ongoing management  Multiple medication changes that could be contributing to symptoms  Potential cardiac etiology (bradycardia) requiring monitoring  Recurrent UTI with risk of progression to more serious infection  Chronic kidney disease with risk of further deterioration   The management decisions today included:    Ordering multiple diagnostic tests to evaluate fatigue etiology  Assessment of potential medication adverse effects  Recommendation for cardiac monitoring  Continuation of complex medication regimen requiring careful consideration of interactions and side effects  Development of monitoring plan for potential progression of symptoms    CONCLUSION   These elements together support moderate complexity medical decision making for today's encounter.

## 2023-12-20 NOTE — Telephone Encounter (Signed)
 Pharmacy Patient Advocate Encounter   Received notification from Onbase that prior authorization for Mounjaro 7.5MG /0.5ML auto-injectors is required/requested.   Insurance verification completed.   The patient is insured through [facility] ADVANTAGE/RX ADVANCE .   Per test claim: PA required; PA submitted to above mentioned insurance via CoverMyMeds Key/confirmation #/EOC [id number] Status is pending

## 2023-12-20 NOTE — Patient Instructions (Addendum)
 AFTER VISIT SUMMARY Date: December 20, 2023      Reason for Visit: Severe Fatigue New Fatigue   We discussed your sudden onset of severe fatigue that began yesterday. We have ordered several tests to help determine the cause of your fatigue, including:    Complete blood count  Thyroid function tests  Iron studies  Kidney function tests  Urinalysis   The blood work and urinalysis completed today showed:   Your vitamin B12 and folate levels are normal  Your kidney function remains stable  There are some signs of a possible urinary tract infection   Next Steps:   1. Heart Rate Monitoring: Your heart rate is lower than your usual today (53) and was also low last week (55). Consider purchasing an Apple Watch to monitor your heart rate, sleep patterns, and oxygen levels. This will help [place] determine if your heart rate is contributing to your fatigue.   2. Medication Considerations: Your recent increases in Effexor and gabapentin may be contributing to your fatigue. We'll monitor this closely and may adjust dosages if your symptoms persist.   3. Rest and Hydration: Ensure you're getting adequate rest and staying well-hydrated while we investigate the cause of your fatigue.    Urinary Tract Infection   Your urinalysis shows some signs of a possible urinary tract infection.   Continue your current preventive measures:   Take methenamine (HIPREX) 1g twice daily as prescribed  Continue cranberry extract 250mg  daily  Drink plenty of water (at least 6-8 glasses daily)   Warning Signs: Contact our office or go to the emergency room if you develop:   Fever over 100.55F  Pain in your back or sides  Nausea or vomiting  Increasing urinary symptoms    Osteoporosis Management    Continue your romosozumab (Evenity) injections as scheduled  Use your calcitonin nasal spray daily as prescribed  Continue calcium supplementation and vitamin D  Be careful to prevent falls  Report any new back pain or  skeletal symptoms    FOLLOW-UP CARE    Return to clinic in 1 week to review lab results and reassess your symptoms  Continue all current medications unless otherwise instructed  Bring a record of your heart rate measurements to your next appointment if you obtain a monitoring device    WHEN TO SEEK IMMEDIATE MEDICAL ATTENTION   Go to the emergency room if you experience:    Severe shortness of breath  Chest pain  Fever over 100.55F  Confusion or extreme drowsiness  Severe dizziness or fainting    QUESTIONS?   If you have questions before your next appointment, please call our office at [phone]

## 2023-12-21 ENCOUNTER — Other Ambulatory Visit (HOSPITAL_COMMUNITY): Payer: Self-pay

## 2023-12-21 LAB — IRON,TIBC AND FERRITIN PANEL
%SAT: 48 % — ABNORMAL HIGH (ref 16–45)
Ferritin: 27 ng/mL (ref 16–288)
Iron: 130 ug/dL (ref 45–160)
TIBC: 269 ug/dL (ref 250–450)

## 2023-12-21 LAB — TSH+FREE T4: TSH W/REFLEX TO FT4: 2.57 m[IU]/L (ref 0.40–4.50)

## 2023-12-21 NOTE — Telephone Encounter (Signed)
 Pharmacy Patient Advocate Encounter  Received notification from [facility] ADVANTAGE/RX ADVANCE that Prior Authorization for [facility] 7.5MG  has been APPROVED from 12/20/23 to 12/19/24. Unable to obtain price due to refill too soon rejection, last fill date 12/20/23 next available fill date04/01/25   PA #/Case ID/Reference #: [id number]

## 2023-12-22 ENCOUNTER — Encounter: Payer: Self-pay | Admitting: Internal Medicine

## 2023-12-22 DIAGNOSIS — M545 Low back pain, unspecified: Secondary | ICD-10-CM | POA: Diagnosis not present

## 2023-12-22 DIAGNOSIS — S32010A Wedge compression fracture of first lumbar vertebra, initial encounter for closed fracture: Secondary | ICD-10-CM | POA: Diagnosis not present

## 2023-12-22 DIAGNOSIS — M5416 Radiculopathy, lumbar region: Secondary | ICD-10-CM | POA: Diagnosis not present

## 2023-12-22 NOTE — Progress Notes (Signed)
 Reviewed labs from 12/20/2023. Key findings: Nutrition labs (B12, folate, iron) normal; GFR improved to 66.77; Urinalysis shows persistent WBCs (21-50/hpf) suggesting ongoing UTI despite treatment; Inflammatory markers normal. Plan: Continue methenamine prophylaxis; Monitor fatigue symptoms; Further workup ordered (CBC, thyroid function); Return in 1 week to reassess. See Patient Message for details.

## 2023-12-26 ENCOUNTER — Ambulatory Visit (INDEPENDENT_AMBULATORY_CARE_PROVIDER_SITE_OTHER): Payer: HMO | Admitting: Internal Medicine

## 2023-12-26 ENCOUNTER — Encounter: Payer: Self-pay | Admitting: Internal Medicine

## 2023-12-26 VITALS — BP 104/70 | HR 71 | Temp 97.9°F | Ht 62.0 in | Wt 129.4 lb

## 2023-12-26 DIAGNOSIS — E1121 Type 2 diabetes mellitus with diabetic nephropathy: Secondary | ICD-10-CM | POA: Insufficient documentation

## 2023-12-26 DIAGNOSIS — Z0001 Encounter for general adult medical examination with abnormal findings: Secondary | ICD-10-CM

## 2023-12-26 DIAGNOSIS — E039 Hypothyroidism, unspecified: Secondary | ICD-10-CM

## 2023-12-26 DIAGNOSIS — N390 Urinary tract infection, site not specified: Secondary | ICD-10-CM

## 2023-12-26 DIAGNOSIS — I499 Cardiac arrhythmia, unspecified: Secondary | ICD-10-CM | POA: Diagnosis not present

## 2023-12-26 DIAGNOSIS — Z0000 Encounter for general adult medical examination without abnormal findings: Secondary | ICD-10-CM

## 2023-12-26 NOTE — Patient Instructions (Signed)
 Managing Your Health Over Time  Managing every aspect of your health in a single visit isn't always feasible, but that's okay.  We addressed many preventive issues today and charted a course for future care. Acute conditions or preventive care measures may require further attention.  We encourage you to schedule a follow-up visit at your earliest convenience to discuss any unresolved issues.  We strongly encourage continued participation in annual preventive care visits to help [place] develop a more thorough understanding of your health and to help you maintain optimal wellness - please inquire about scheduling your next one with [place] at your earliest convenience.  Next Steps  [x]   Schedule Follow-Up:  We recommend a follow-up appointment in 1 year for your next wellness visit.  If you develop any new problems, want to address any medical issues, or your condition worsens before then, please call us for an appointment or seek emergency care. [x]   Preventive Care:  Make sure to keep regular appointments with dental and vision professionals, use nightly nasal saline mist sprays to keep your sinuses clear and toothbrushing to protect your teeth. Use [employer] App or other app to track your sleep quality. Check blood pressure and heart rate routinely. [x]   Medical Information Release:  For any relevant medical information we don't have, please sign a release form at the front desk so we can obtain it for your records. [x]   Lab Tests:  Schedule any lab tests from today for within a week to ensure best insurance coverage.    Making the Most of Our Focused (20 minute) Appointments:  [x]   Clearly state your top concerns at the beginning of the visit to focus our discussion [x]   If you anticipate you will need more time, please inform the front desk during scheduling - we can book multiple appointments in the same week. [x]   If you have transportation problems- use our convenient video appointments or ask about  transportation support. [x]   We can get down to business faster if you use MyChart to update information before the visit and submit non-urgent questions before your visit. Thank you for taking the time to provide details through MyChart.  Let our nurse know and she can import this information into your encounter documents.  Arrival and Wait Times: [x]   Arriving on time ensures that everyone receives prompt attention. [x]   Early morning (8a) and afternoon (1p) appointments tend to have shortest wait times. [x]   Unfortunately, we cannot delay appointments for late arrivals or hold slots during phone calls.  Bring to Your Next Appointment  [x]   Medications: Please bring all your medication bottles to your next appointment to ensure we have an accurate record of your prescriptions. [x]   Health Diaries: If you're monitoring any health conditions at home, keeping a diary of your readings can be very helpful for discussions at your next appointment.  Reviewing Your Records  [x]   Review your attached preventive care information at the end of these patient instructions. [x]   Review this early draft of your clinical encounter notes below and the final encounter summary tomorrow on MyChart after its been completed.   Encounter for annual health examination  Irregular heartbeat -     EKG 12-Lead -     CBC with Differential/Platelet -     Comprehensive metabolic panel -     Lipid panel -     Hemoglobin A1c -     Microalbumin / creatinine urine ratio -     TSH  Diabetic nephropathy associated with type 2 diabetes mellitus (HCC) -     Hemoglobin A1c  Acquired hypothyroidism -     Lipid panel  Recurrent UTI     Getting Answers and Following Up  [x]   Simple Questions & Concerns: For quick questions or basic follow-up after your visit, reach [place] at [phone] or MyChart messaging. [x]   Complex Concerns: If your concern is more complex, scheduling an appointment might be best. Discuss this  with the staff to find the most suitable option. [x]   Lab & Imaging Results: We'll contact you directly if results are abnormal or you don't use MyChart. Most normal results will be on MyChart within 2-3 business days, with a review message from Dr. Jon Billings. Haven't heard back in 2 weeks? Need results sooner? Contact us at [phone]. [x]   Referrals: Our referral coordinator will manage specialist referrals. The specialist's office should contact you within 2 weeks to schedule an appointment. Call us if you haven't heard from them after 2 weeks.  Staying Connected  [x]   MyChart: Activate your MyChart for the fastest way to access results and message [place]. See the last page of this paperwork for instructions on how to activate.  Billing  [x]   X-ray & Lab Orders: These are billed by separate companies. Contact the invoicing company directly for questions or concerns. [x]   Visit Charges: Discuss any billing inquiries with our administrative services team.  Your Satisfaction Matters  [x]   Share Your Experience: We strive for your satisfaction! If you have any complaints, or preferably compliments, please let Dr. Jon Billings know directly or contact our Practice Administrators, Edwena Felty or [employer], by asking at the front desk.

## 2023-12-26 NOTE — Assessment & Plan Note (Signed)
 Insurance wouldn't cover Laura Farrell, we give samples when possible We avoid jardiance due to recurrent urinary tract infection (UTI). Diabetes is well-controlled with the current medication regimen. Foot exam showed no diabetic damage. Continue current diabetes management regimen.

## 2023-12-26 NOTE — Assessment & Plan Note (Signed)
 Recurrent UTIs are likely due to postmenopausal changes causing decreased estrogen and blood flow to the urethra and vagina, reducing infection resistance. Current treatment includes estrogen cream to improve urethral health and prevent reinfection. Urodynamics testing is unnecessary as bladder emptying is complete. Focus remains on improving urethral health to prevent bacterial overgrowth. Continue estrogen cream application and encourage hydration with electrolytes.

## 2023-12-26 NOTE — Telephone Encounter (Signed)
 Patient's review of lab results/notes confirmed. Also, patient saw Dr. Jon Billings for a follow-up visit today.

## 2023-12-26 NOTE — Progress Notes (Signed)
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 weight, and exercise? Eat a healthy diet  Eat a diet that includes plenty of vegetables, fruits, low-fat dairy products, and lean protein. Do not eat a lot of foods that are high in solid fats, added sugars, or sodium. Maintain a healthy weight Body mass index (BMI) is used to identify weight problems. It estimates body fat based on height and weight. Your health care provider can help determine your BMI and help you achieve or maintain a healthy weight. Get regular exercise Get regular exercise. This is one of the most important things you can do for your health. Most adults should: Exercise for at least 150 minutes each week. The exercise should increase your heart rate and make you sweat (moderate-intensity exercise). Do strengthening exercises at least twice a week. This is in addition to the moderate-intensity exercise. Spend less  time sitting. Even light physical activity can be  beneficial. Watch cholesterol and blood lipids Have your blood tested for lipids and cholesterol at 80 years of age, then have this test every 5 years. Have your cholesterol levels checked more often if: Your lipid or cholesterol levels are high. You are older than 80 years of age. You are at high risk for heart disease. What should I know about cancer screening? Depending on your health history and family history, you may need to have cancer screening at various ages. This may include screening for: Breast cancer. Cervical cancer. Colorectal cancer. Skin cancer. Lung cancer. What should I know about heart disease, diabetes, and high blood pressure? Blood pressure and heart disease High blood pressure causes heart disease and increases the risk of stroke. This is more likely to develop in people who have high blood pressure readings or are overweight. Have your blood pressure checked: Every 3-5 years if you are 48-55 years of age. Every year if you are 65 years old or older. Diabetes Have regular diabetes screenings. This checks your fasting blood sugar level. Have the screening done: Once every three years after age 54 if you are at a normal weight and have a low risk for diabetes. More often and at a younger age if you are overweight or have a high risk for diabetes. What should I know about preventing infection? Hepatitis B If you have a higher risk for hepatitis B, you should be screened for this virus. Talk with your health care provider to find out if you are at risk for hepatitis B infection. Hepatitis C Testing is recommended for: Everyone born from [birth year] through 1967. Anyone with known risk factors for hepatitis C. Sexually transmitted infections (STIs) Get screened for STIs, including gonorrhea and chlamydia, if: You are sexually active and are younger than 80 years of age. You are older than 80 years of age and your health care provider tells you that you are at risk for  this type of infection. Your sexual activity has changed since you were last screened, and you are at increased risk for chlamydia or gonorrhea. Ask your health care provider if you are at risk. Ask your health care provider about whether you are at high risk for HIV. Your health care provider may recommend a prescription medicine to help prevent HIV infection. If you choose to take medicine to prevent HIV, you should first get tested for HIV. You should then be tested every 3 months for as long as you are taking the medicine. Pregnancy If you are about to stop having your period (premenopausal) and you may become pregnant, seek counseling before you get pregnant. Take 400 to 800 micrograms (mcg) of folic acid every day if you become pregnant. Ask for birth control (contraception) if you want to prevent pregnancy. Osteoporosis and menopause Osteoporosis is a disease in which the bones lose minerals and strength with aging. This can result in bone fractures. If you are 76 years old or older, or if you are at risk for osteoporosis and fractures, ask your health care provider if you should: Be screened for bone loss. Take a calcium or vitamin D supplement to lower your risk of fractures. Be given hormone replacement therapy (HRT) to treat symptoms of menopause. Follow these instructions at home: Alcohol use Do not drink alcohol if: Your health care provider tells you not to drink. You are pregnant, may be pregnant, or are planning to become pregnant. If you drink alcohol:  Limit how much you have to: 0-1 drink a day. Know how much alcohol is in your drink. In the [place] one drink equals one 12 oz bottle of beer (355 mL), one 5 oz glass of wine (148 mL), or one 1 oz glass of hard liquor (44 mL). Lifestyle Do not use any products that contain nicotine or tobacco. These products include cigarettes, chewing tobacco, and vaping devices, such as e-cigarettes. If you need help quitting, ask your health  care provider. Do not use street drugs. Do not share needles. Ask your health care provider for help if you need support or information about quitting drugs. General instructions Schedule regular health, dental, and eye exams. Stay current with your vaccines. Tell your health care provider if: You often feel depressed. You have ever been abused or do not feel safe at home. Summary Adopting a healthy lifestyle and getting preventive care are important in promoting health and wellness. Follow your health care provider's instructions about healthy diet, exercising, and getting tested or screened for diseases. Follow your health care provider's instructions on monitoring your cholesterol and blood pressure. This information is not intended to replace advice given to you by your health care provider. Make sure you discuss any questions you have with your health care provider. Document Revised: 02/16/2021 Document Reviewed: 02/16/2021 Elsevier Patient Education  2024 [employer].

## 2023-12-26 NOTE — Assessment & Plan Note (Signed)
 Suspected intermittent AFib is based on an irregular heartbeat during examination, though EKG showed normal rhythm. AFib poses a stroke risk, and current management with baby aspirin may be insufficient. Further monitoring is needed to confirm AFib presence. Discussed potential need for stronger anticoagulants if AFib is confirmed. Upgrading to a watch with AFib detection capabilities is recommended. Consider stronger anticoagulants if AFib is confirmed and monitor for symptoms such as palpitations, chest pain, or syncope.

## 2023-12-26 NOTE — Assessment & Plan Note (Signed)
 Fatigue resolved at this time     Latest Ref Rng & Units 10/31/2023    9:31 AM 08/05/2023   10:51 AM 07/12/2023    8:45 AM 06/08/2023    3:02 PM 12/14/2022    8:56 AM 08/17/2022    8:55 AM 06/11/2022   11:19 AM  THYROID  TSH 0.450 - 4.500 uIU/mL 1.430    0.30  1.82  2.00  0.25   T4,Free(Direct) 0.82 - 1.77 ng/dL 2.59  1.5  1.4    5.63  0.80

## 2023-12-27 DIAGNOSIS — M5416 Radiculopathy, lumbar region: Secondary | ICD-10-CM | POA: Diagnosis not present

## 2023-12-27 DIAGNOSIS — S32010A Wedge compression fracture of first lumbar vertebra, initial encounter for closed fracture: Secondary | ICD-10-CM | POA: Diagnosis not present

## 2023-12-27 DIAGNOSIS — M545 Low back pain, unspecified: Secondary | ICD-10-CM | POA: Diagnosis not present

## 2024-01-02 ENCOUNTER — Encounter: Payer: Self-pay | Admitting: Internal Medicine

## 2024-01-02 ENCOUNTER — Ambulatory Visit (INDEPENDENT_AMBULATORY_CARE_PROVIDER_SITE_OTHER): Payer: HMO | Admitting: Internal Medicine

## 2024-01-02 VITALS — BP 112/78 | HR 65 | Temp 97.3°F | Ht 62.0 in | Wt 128.6 lb

## 2024-01-02 DIAGNOSIS — G8929 Other chronic pain: Secondary | ICD-10-CM | POA: Diagnosis not present

## 2024-01-02 DIAGNOSIS — E782 Mixed hyperlipidemia: Secondary | ICD-10-CM | POA: Diagnosis not present

## 2024-01-02 DIAGNOSIS — K219 Gastro-esophageal reflux disease without esophagitis: Secondary | ICD-10-CM

## 2024-01-02 DIAGNOSIS — L719 Rosacea, unspecified: Secondary | ICD-10-CM | POA: Diagnosis not present

## 2024-01-02 DIAGNOSIS — M549 Dorsalgia, unspecified: Secondary | ICD-10-CM

## 2024-01-02 DIAGNOSIS — N1831 Chronic kidney disease, stage 3a: Secondary | ICD-10-CM | POA: Diagnosis not present

## 2024-01-02 DIAGNOSIS — E1122 Type 2 diabetes mellitus with diabetic chronic kidney disease: Secondary | ICD-10-CM | POA: Diagnosis not present

## 2024-01-02 DIAGNOSIS — N390 Urinary tract infection, site not specified: Secondary | ICD-10-CM | POA: Diagnosis not present

## 2024-01-02 DIAGNOSIS — Z0000 Encounter for general adult medical examination without abnormal findings: Secondary | ICD-10-CM

## 2024-01-02 DIAGNOSIS — I499 Cardiac arrhythmia, unspecified: Secondary | ICD-10-CM | POA: Diagnosis not present

## 2024-01-02 DIAGNOSIS — G250 Essential tremor: Secondary | ICD-10-CM | POA: Diagnosis not present

## 2024-01-02 DIAGNOSIS — R5383 Other fatigue: Secondary | ICD-10-CM | POA: Diagnosis not present

## 2024-01-02 DIAGNOSIS — G4733 Obstructive sleep apnea (adult) (pediatric): Secondary | ICD-10-CM | POA: Diagnosis not present

## 2024-01-02 MED ORDER — ROSUVASTATIN CALCIUM 40 MG PO TABS
40.0000 mg | ORAL_TABLET | Freq: Every day | ORAL | 3 refills | Status: AC
Start: 2024-01-02 — End: ?

## 2024-01-02 MED ORDER — OMEPRAZOLE 10 MG PO CPDR: 10.0000 mg | DELAYED_RELEASE_CAPSULE | Freq: Every day | ORAL | 3 refills | Status: DC

## 2024-01-02 NOTE — Assessment & Plan Note (Signed)
 Assessment: Chronic back pain shows minimal improvement despite ongoing management. Current regimen includes physical therapy twice weekly, venlafaxine, tizanidine, and tramadol as needed. Gabapentin 600 mg BID provides questionable benefit with potential for dizziness side effects. Pain related to previous compression fracture. Plan: Taper gabapentin: Reduce to 300 mg BID for 1 week, then 300 mg daily for 1 week, then discontinue Continue physical therapy twice weekly Continue tizanidine and tramadol as needed for pain, with caution regarding sedation and fall risk Maintain venlafaxine 75 mg daily, which may provide additional pain benefit Review efficacy of gabapentin taper at next visit

## 2024-01-02 NOTE — Assessment & Plan Note (Signed)
 Assessment: Persistent facial rosacea with inadequate response to topical treatments. Inquiring about more effective management options. Plan: Continue current topical treatments Consider referral for laser therapy evaluation, noting expected out-of-pocket cost as typically not covered by insurance Reinforce trigger avoidance: sun exposure, heat, spicy foods, alcohol

## 2024-01-02 NOTE — Assessment & Plan Note (Signed)
 Assessment: Patient monitors heart rate with personal device showing predominantly sinus rhythm in the 60s with no documented atrial fibrillation episodes. Poor quality recording today likely due to artifact. Previous episode of severe fatigue raises concern for possible intermittent atrial fibrillation, which would increase stroke risk. Currently on aspirin 81 mg for stroke prevention. Plan: Continue daily heart rate monitoring with personal device Report any atrial fibrillation detection immediately Consider upgrading to device with better atrial fibrillation detection capability Maintain aspirin 81 mg daily Follow up in 1 week to reassess

## 2024-01-02 NOTE — Progress Notes (Signed)
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 improved renal function, continued PPI therapy presents unnecessary kidney risk. No active reflux symptoms at present. Plan: Taper omeprazole: Reduce to 10 mg daily for 2 weeks, then discontinue Use Pepcid Complete as needed for breakthrough symptoms Dietary modifications: Avoid late meals, acidic foods, and alcohol Elevate head of bed 30 degrees if symptoms return Mixed hyperlipidemia Assessment: Currently managed with rosuvastatin 20 mg daily. Known atherosclerosis of aorta requires optimal lipid management. Current dose well-tolerated without side effects. Plan: Increase rosuvastatin to 40 mg daily for more aggressive lipid control Monitor for medication side effects Recheck lipid panel in 3 months Irregular heartbeat Assessment: Patient monitors heart rate with personal device showing predominantly sinus rhythm in the 60s with no  documented atrial fibrillation episodes. Poor quality recording today likely due to artifact. Previous episode of severe fatigue raises concern for possible intermittent atrial fibrillation, which would increase stroke risk. Currently on aspirin 81 mg for stroke prevention. Plan: Continue daily heart rate monitoring with personal device Report any atrial fibrillation detection immediately Consider upgrading to device with better atrial fibrillation detection capability Maintain aspirin 81 mg daily Follow up in 1 week to reassess Other fatigue Assessment: Fatigue has improved from previous severe episode but persists. Multiple potential contributing factors include recent medication adjustments (Effexor increase to 75 mg, levothyroxine reduction to 88 mcg), persistent UTI, and possible sleep disturbance. Recent labs show normal TSH (2.57), normal B12, and normal inflammatory markers. Plan: Monitor fatigue symptoms and correlation with heart rate Consider reducing Effexor to 37.5 mg if fatigue persists beyond 2 weeks Ensure adequate hydration and electrolyte balance Follow up in 1 week to reassess Recurrent UTI Assessment: Persistent UTI with microscopic pyuria (21-50 WBCs/hpf) and few bacteria on urinalysis. Negative nitrites and leukocyte esterase. Renal cyst may be contributing to recurrent infections. Currently on methenamine 1g BID for prophylaxis and vaginal estrogen therapy to improve urethral defense mechanisms. Plan: Continue methenamine prophylaxis Maintain vaginal estrogen cream application 3 times weekly Continue cranberry supplement 250 mg daily Ensure adequate hydration (minimum 64 oz water daily) Proceed with scheduled urology evaluation this week Discuss renal cyst and its potential impact on UTI recurrence with urologist Chronic kidney disease, stage 3a (HCC) Assessment: Recent laboratory shows significant improvement in kidney function with GFR 66.77 (previously 59.06), now  improved to Stage 2. Current medications include finerenone 20 mg daily for renal protection but insurance not covering so depending on samples. Urine shows dark yellow color suggesting possible dehydration despite improved GFR. Plan: Continue finerenone 20 mg daily for renal protection Encourage increased fluid intake to maintain hydration Continue to avoid nephrotoxic medications (NSAIDs) Monitor renal function with next  scheduled labs No immediate need for repeat kidney function testing unless clinically indicated Type 2 diabetes mellitus with stage 3a chronic kidney disease, without long-term current use of insulin (HCC) Assessment: Well-controlled with tirzepatide (Mounjaro) 7.5 mg weekly. Recent glucose 96 mg/dL. No hypoglycemic symptoms reported. Recent weight reduction from 133 to 126 pounds likely related to [facility] therapy. Provides additional kidney protection through GLP-1 mechanism. Plan: Continue Mounjaro 7.5 mg weekly Monitor for medication side effects Continue regular blood glucose monitoring Maintain current dietary approach Regular physical activity as tolerated Chronic back pain greater than 3 months duration Assessment: Chronic back pain shows minimal improvement despite ongoing management. Current regimen includes physical therapy twice weekly, venlafaxine, tizanidine, and tramadol as needed. Gabapentin 600 mg BID provides questionable benefit with potential for dizziness side effects. Pain related to previous compression fracture. Plan: Taper gabapentin: Reduce to 300 mg BID for 1 week, then 300 mg daily for 1 week, then discontinue Continue physical therapy twice weekly Continue tizanidine and tramadol as needed for pain, with caution regarding sedation and fall risk Maintain venlafaxine 75 mg daily, which may provide additional pain benefit Review efficacy of gabapentin taper at next visit OSA (obstructive sleep apnea) Assessment: History of OSA without current CPAP  use. Using Apple Watch to monitor sleep patterns, reporting fragmented sleep with nighttime awakening and difficulty returning to sleep. No notifications of sleep apnea events received from watch monitoring. Plan: Continue monitoring sleep patterns with Apple Watch Consider sleep study reassessment if watch data suggests apnea events Evaluate contribution of OSA to persistent fatigue Review sleep hygiene measures Essential tremor Assessment: Under neurologist care for atypical tremor. Current management includes propranolol 10 mg BID, which may contribute to lower heart rate. Neurologist may consider medication adjustment based on blood pressure and heart rate patterns. Plan: Continue current management with neurologist oversight Monitor blood pressure and heart rate Report consistently low readings to neurologist for medication adjustment consideration Healthcare maintenance Health Maintenance Diabetic foot exam completed last week with normal findings Ophthalmology examination scheduled for next week (overdue since 09/2020) Continue weekly monitoring until fatigue resolves Verify cardiac consultation status Consider medication reconciliation to address polypharmacy (currently on 14 medications) Follow-up: 1 week for reassessment of heart rate, fatigue, and UTI status. Rosacea Assessment: Persistent facial rosacea with inadequate response to topical treatments. Inquiring about more effective management options. Plan: Continue current topical treatments Consider referral for laser therapy evaluation, noting expected out-of-pocket cost as typically not covered by insurance Reinforce trigger avoidance: sun exposure, heat, spicy foods, alcohol      Orders Placed During this Encounter:  No orders of the defined types were placed in this encounter.  Meds ordered this encounter  Medications   omeprazole (PRILOSEC) 10 MG capsule    Sig: Take 1 capsule (10 mg total) by mouth daily. Use to  taper off as tolerated, to just as needed Pepcid complete    Dispense:  90 capsule    Refill:  3   rosuvastatin (CRESTOR) 40 MG tablet    Sig: Take 1 tablet (40 mg total) by mouth daily.    Dispense:  90 tablet    Refill:  3        **This document was synthesized by artificial intelligence (Abridge) using HIPAA-compliant recording of the clinical interaction;   We discussed the use of AI scribe software for clinical note transcription with the patient, who gave verbal consent to proceed.    Additional Info: This encounter employed state-of-the-art, real-time, collaborative documentation. The patient actively reviewed and  assisted in updating their electronic medical record on a shared screen, ensuring transparency and facilitating joint problem-solving for the problem list, overview, and plan. This approach promotes accurate, informed care. The treatment plan was discussed and reviewed in detail, including medication safety, potential side effects, and all patient questions. We confirmed understanding and comfort with the plan. Follow-up instructions were established, including contacting the office for any concerns, returning if symptoms worsen, persist, or new symptoms develop, and precautions for potential emergency department visits.   ## Medical Decision Making Documentation  ### Level of MDM: High Complexity  1. **Number and Complexity of Problems Addressed:**        Today's encounter involved multiple chronic conditions with significant treatment management challenges:        - **Suspected intermittent atrial fibrillation:** A high-risk cardiac condition requiring careful monitoring for potential stroke prevention intervention. Currently stable but with risk of significant morbidity if undetected episodes occur.        - **Recurrent urinary tract infection:** Despite appropriate prophylactic treatment with methenamine, patient continues to show evidence of infection with elevated  WBCs (21-50/hpf) on urinalysis. Management is complicated by renal cyst and chronic kidney disease.        - **Chronic back pain:** Compression fracture with 40% vertebral body height loss and minimal response to current multimodal therapy. Complex medication management required with gabapentin taper while balancing pain control needs.        - **Type 2 diabetes with improved chronic kidney disease:** Requires careful medication management and monitoring. Recent significant improvement in kidney function (GFR from 59.06 to 66.77) necessitates reassessment of medication regimen.        - **Persistent fatigue:** Multifactorial etiology requiring differential diagnostic consideration including cardiac causes, medication effects, infection, and sleep disturbance.  2. **Amount and/or Complexity of Data to be Reviewed and Analyzed:**        Extensive data review conducted during this encounter:        - Comprehensive laboratory interpretation: CBC, comprehensive metabolic panel, TSH, urinalysis with microscopic examination        - Analysis of heart rate monitoring data with evaluation for potential cardiac rhythm abnormalities        - Review of medication efficacy and potential adverse effects across multiple medication classes (antibiotic prophylaxis, antihypertensives, antidiabetics, analgesics, anxiolytics)        - Assessment of renal function improvement and implications for medication management        - Evaluation of weight trend data showing 7-pound reduction over two months  3. **Risk of Complications and/or Morbidity or Mortality:**        High risk management decisions were required during this encounter:        - Complex medication adjustments including the decision to taper gabapentin due to minimal benefit and potential fall risk in an elderly patient with osteoporosis and compression fracture        - Management of recurrent UTI with renal cyst involvement, requiring  consideration of progression to pyelonephritis or urosepsis        - Cardiovascular risk assessment for potential atrial fibrillation with stroke prevention considerations        - Management of multiple medication interactions in a patient on 14 different medications        - Balancing glycemic control with weight management and renal protection in elderly diabetic patient        - Statin dose intensification decision with consideration of risk/benefit profile  ### Medical Necessity Statement:  The high complexity medical decision making demonstrated in this encounter was medically necessary due to the patient's multiple interacting chronic conditions, complex medication regimen requiring adjustment, and significant risk factors including advanced age, osteoporosis with compression fracture, recurrent infection, and suspected cardiac arrhythmia. Management decisions required careful consideration of risk/benefit profiles for medication changes and comprehensive integration of multiple data elements to ensure patient safety and optimal outcomes.  ### Attestation:  I have personally performed a detailed evaluation of this 78 year old female with multiple chronic conditions requiring high complexity medical decision making as documented above. My assessment included comprehensive review of laboratory data, medication efficacy and side effects, and careful consideration of risk/benefit profiles for all management decisions.  Total visit duration: 26 minutes .  I have personally spent 35 minutes involved in face-to-face and non-face-to-face activities for this patient on the day of the visit. Professional time spent includes the following activities: Preparing to see the patient (review of tests), Obtaining and/or reviewing separately obtained history (admission/discharge record), Performing a medically appropriate examination and/or evaluation , Ordering medications/tests/procedures, referring and  communicating with other health care professionals, Documenting clinical information in the EMR, Independently interpreting results (not separately reported), Communicating results to the patient/family/caregiver, Counseling and educating the patient/family/caregiver and Care coordination (not separately reported).

## 2024-01-02 NOTE — Assessment & Plan Note (Signed)
 Assessment: Recent laboratory shows significant improvement in kidney function with GFR 66.77 (previously 59.06), now improved to Stage 2. Current medications include finerenone 20 mg daily for renal protection but insurance not covering so depending on samples. Urine shows dark yellow color suggesting possible dehydration despite improved GFR. Plan: Continue finerenone 20 mg daily for renal protection Encourage increased fluid intake to maintain hydration Continue to avoid nephrotoxic medications (NSAIDs) Monitor renal function with next scheduled labs No immediate need for repeat kidney function testing unless clinically indicated

## 2024-01-02 NOTE — Assessment & Plan Note (Signed)
 Assessment: Under neurologist care for atypical tremor. Current management includes propranolol 10 mg BID, which may contribute to lower heart rate. Neurologist may consider medication adjustment based on blood pressure and heart rate patterns. Plan: Continue current management with neurologist oversight Monitor blood pressure and heart rate Report consistently low readings to neurologist for medication adjustment consideration

## 2024-01-02 NOTE — Assessment & Plan Note (Signed)
 Assessment: Persistent UTI with microscopic pyuria (21-50 WBCs/hpf) and few bacteria on urinalysis. Negative nitrites and leukocyte esterase. Renal cyst may be contributing to recurrent infections. Currently on methenamine 1g BID for prophylaxis and vaginal estrogen therapy to improve urethral defense mechanisms. Plan: Continue methenamine prophylaxis Maintain vaginal estrogen cream application 3 times weekly Continue cranberry supplement 250 mg daily Ensure adequate hydration (minimum 64 oz water daily) Proceed with scheduled urology evaluation this week Discuss renal cyst and its potential impact on UTI recurrence with urologist

## 2024-01-02 NOTE — Assessment & Plan Note (Signed)
 Assessment: Currently managed with omeprazole 20 mg daily. With improved renal function, continued PPI therapy presents unnecessary kidney risk. No active reflux symptoms at present. Plan: Taper omeprazole: Reduce to 10 mg daily for 2 weeks, then discontinue Use Pepcid Complete as needed for breakthrough symptoms Dietary modifications: Avoid late meals, acidic foods, and alcohol Elevate head of bed 30 degrees if symptoms return

## 2024-01-02 NOTE — Assessment & Plan Note (Signed)
 Assessment: History of OSA without current CPAP use. Using Apple Watch to monitor sleep patterns, reporting fragmented sleep with nighttime awakening and difficulty returning to sleep. No notifications of sleep apnea events received from watch monitoring. Plan: Continue monitoring sleep patterns with Apple Watch Consider sleep study reassessment if watch data suggests apnea events Evaluate contribution of OSA to persistent fatigue Review sleep hygiene measures

## 2024-01-02 NOTE — Assessment & Plan Note (Signed)
 Assessment: Currently managed with rosuvastatin 20 mg daily. Known atherosclerosis of aorta requires optimal lipid management. Current dose well-tolerated without side effects. Plan: Increase rosuvastatin to 40 mg daily for more aggressive lipid control Monitor for medication side effects Recheck lipid panel in 3 months

## 2024-01-02 NOTE — Patient Instructions (Signed)
 Clinical Assessment and Plan Summary 1. Assessment Categories CRITICAL FINDINGS: No immediate emergency concerns identified DIAGNOSTIC IMPRESSIONS: Irregular heartbeat (possible intermittent atrial fibrillation), recurrent UTI, chronic back pain STABLE CONDITIONS: Diabetes well-controlled on Mounjaro, improved kidney function, hyperlipidemia on therapy SOCIAL DETERMINANTS: Appears to maintain good functional status and independence despite medical complexity  INCIDENTAL FINDINGS: Improved GFR from previous readings, weight reduction likely related to [facility] 2. Diagnostic Results Summary Test Result Reference Range Clinical Significance  Urinalysis - WBC 21-50/hpf [ELEVATED] 0-2/hpf Indicates ongoing urinary tract infection  GFR 66.77 mL/min [IMPROVED] >60 mL/min Improved kidney function, now Stage 2 CKD  TSH 2.57 mIU/L [NORMAL] 0.40 - 4.50 mIU/L Normal thyroid function on current dose  Glucose 96 mg/dL [NORMAL] 70 - 99 mg/dL Well-controlled diabetes  3. Clinical Status Indicators MONITORING REQUIRED: Continue heart rate monitoring to detect potential atrial fibrillation episodes. Report any detected episodes immediately.  POSITIVE RESPONSE: Kidney function has improved significantly. Weight reduction showing good response to [facility] therapy.  MEDICATION ADJUSTMENTS: Gabapentin taper initiated. Rosuvastatin dose to increase. Omeprazole to be tapered and discontinued.  4. Treatment Plan COMPREHENSIVE TREATMENT PLAN   Heart Monitoring - Continue daily heart rate monitoring with personal device - Report any atrial fibrillation detection immediately - Consider upgrading to device with better atrial fibrillation detection   Urinary Tract Infection - Continue methenamine 1g twice daily - Maintain vaginal estrogen cream application 3 times weekly - Ensure adequate hydration (minimum 64 oz water daily) - Attend scheduled urology evaluation later this week   Medication Changes GABAPENTIN: Taper  as follows: - Reduce to 300 mg twice daily for 1 week - Then 300 mg once daily for 1 week - Then discontinue completely  ROSUVASTATIN: Increase from 20 mg to 40 mg daily  OMEPRAZOLE: Taper as follows: - Reduce to 10 mg daily for 2 weeks - Then discontinue completely - Use Pepcid Complete as needed for breakthrough symptoms   Follow-up Return to clinic in 1 week for reassessment of heart rate, fatigue, and UTI status.  Health Maintenance - Attend ophthalmology examination next week (overdue since 09/2020) - Continue weekly monitoring until fatigue resolves - Consider medication reconciliation to address polypharmacy   5. Urgent Warning Signs CALL IMMEDIATELY or SEEK EMERGENCY CARE for: Palpitations, chest pain, or severe dizziness Fever >100.61F (may indicate worsening UTI) Sudden increase in back pain with new weakness or numbness Inability to urinate or severe burning with urination    6. Patient Instructions MEDICATION INSTRUCTIONS: ?? Heart Care: Continue aspirin 81 mg daily to reduce stroke risk. Monitor heart rate daily and report any irregular patterns. ?? Bone Health: Continue romosozumab (Evenity) injections as scheduled. Maintain calcium and vitamin D supplements. ?? Medication Changes: Gabapentin: Begin tapering as outlined above. Watch for any increase in pain as dose decreases. Rosuvastatin: Increase to 40 mg daily starting tomorrow. Omeprazole: Begin tapering as outlined above. Use Pepcid Complete as needed for heartburn. ?? UTI Prevention: Continue vaginal estrogen cream. Maintain good hydration (8 glasses of water daily). Continue methenamine twice daily. ?? Sleep Health: Continue monitoring sleep patterns with Apple Watch. Maintain good sleep hygiene practices.    7. Next Steps APPOINTMENTS: ?? Primary Care: Return in 1 week for follow-up ??? Ophthalmology: Attend scheduled appointment next week ?? Urology: Attend appointment this Thursday ?? Physical Therapy:  Continue twice weekly sessions    Visit Summary completed by: Provider, MD Visit Date: January 02, 2024 Document generated: January 02, 2024 at 10:15 AM

## 2024-01-02 NOTE — Assessment & Plan Note (Signed)
 Assessment: Well-controlled with tirzepatide (Mounjaro) 7.5 mg weekly. Recent glucose 96 mg/dL. No hypoglycemic symptoms reported. Recent weight reduction from 133 to 126 pounds likely related to [facility] therapy. Provides additional kidney protection through GLP-1 mechanism. Plan: Continue Mounjaro 7.5 mg weekly Monitor for medication side effects Continue regular blood glucose monitoring Maintain current dietary approach Regular physical activity as tolerated

## 2024-01-02 NOTE — Assessment & Plan Note (Signed)
 Assessment: Fatigue has improved from previous severe episode but persists. Multiple potential contributing factors include recent medication adjustments (Effexor increase to 75 mg, levothyroxine reduction to 88 mcg), persistent UTI, and possible sleep disturbance. Recent labs show normal TSH (2.57), normal B12, and normal inflammatory markers. Plan: Monitor fatigue symptoms and correlation with heart rate Consider reducing Effexor to 37.5 mg if fatigue persists beyond 2 weeks Ensure adequate hydration and electrolyte balance Follow up in 1 week to reassess

## 2024-01-03 DIAGNOSIS — M5416 Radiculopathy, lumbar region: Secondary | ICD-10-CM | POA: Diagnosis not present

## 2024-01-03 DIAGNOSIS — M545 Low back pain, unspecified: Secondary | ICD-10-CM | POA: Diagnosis not present

## 2024-01-03 DIAGNOSIS — S32010A Wedge compression fracture of first lumbar vertebra, initial encounter for closed fracture: Secondary | ICD-10-CM | POA: Diagnosis not present

## 2024-01-05 DIAGNOSIS — N952 Postmenopausal atrophic vaginitis: Secondary | ICD-10-CM | POA: Diagnosis not present

## 2024-01-05 DIAGNOSIS — N390 Urinary tract infection, site not specified: Secondary | ICD-10-CM | POA: Diagnosis not present

## 2024-01-05 DIAGNOSIS — N3946 Mixed incontinence: Secondary | ICD-10-CM | POA: Diagnosis not present

## 2024-01-09 ENCOUNTER — Encounter: Payer: Self-pay | Admitting: Internal Medicine

## 2024-01-09 ENCOUNTER — Ambulatory Visit (INDEPENDENT_AMBULATORY_CARE_PROVIDER_SITE_OTHER): Payer: HMO | Admitting: Internal Medicine

## 2024-01-09 VITALS — BP 98/60 | HR 84 | Temp 98.3°F | Ht 62.0 in | Wt 124.2 lb

## 2024-01-09 DIAGNOSIS — M8000XS Age-related osteoporosis with current pathological fracture, unspecified site, sequela: Secondary | ICD-10-CM | POA: Diagnosis not present

## 2024-01-09 DIAGNOSIS — M4856XD Collapsed vertebra, not elsewhere classified, lumbar region, subsequent encounter for fracture with routine healing: Secondary | ICD-10-CM

## 2024-01-09 DIAGNOSIS — Z7985 Long-term (current) use of injectable non-insulin antidiabetic drugs: Secondary | ICD-10-CM | POA: Diagnosis not present

## 2024-01-09 DIAGNOSIS — K449 Diaphragmatic hernia without obstruction or gangrene: Secondary | ICD-10-CM

## 2024-01-09 DIAGNOSIS — I7300 Raynaud's syndrome without gangrene: Secondary | ICD-10-CM | POA: Diagnosis not present

## 2024-01-09 DIAGNOSIS — E1121 Type 2 diabetes mellitus with diabetic nephropathy: Secondary | ICD-10-CM | POA: Diagnosis not present

## 2024-01-09 DIAGNOSIS — N390 Urinary tract infection, site not specified: Secondary | ICD-10-CM

## 2024-01-09 DIAGNOSIS — N1831 Chronic kidney disease, stage 3a: Secondary | ICD-10-CM | POA: Diagnosis not present

## 2024-01-09 DIAGNOSIS — N3946 Mixed incontinence: Secondary | ICD-10-CM

## 2024-01-09 DIAGNOSIS — Z79899 Other long term (current) drug therapy: Secondary | ICD-10-CM | POA: Diagnosis not present

## 2024-01-09 MED ORDER — MOUNJARO 7.5 MG/0.5ML ~~LOC~~ SOAJ: 7.5000 mg | SUBCUTANEOUS | 11 refills | Status: DC

## 2024-01-09 MED ORDER — METHENAMINE HIPPURATE 1 G PO TABS: 1.0000 g | ORAL_TABLET | Freq: Two times a day (BID) | ORAL | 3 refills | Status: DC

## 2024-01-09 NOTE — Progress Notes (Signed)
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 office for any concerns, returning if symptoms worsen, persist, or new symptoms develop, and precautions for potential emergency department visits.

## 2024-01-09 NOTE — Assessment & Plan Note (Signed)
 Recurrent UTIs are managed with cefdinir 500 mg twice daily, methenamine 1 g twice daily, both with vitamin C, and vaginal estrogen cream. Methenamine helps reduce antibiotic resistance by activating in low pH urine. The treatment aims to reduce urinary incontinence and eliminate solifenacin. Increased water intake is advised. Significant improvement is expected within six weeks. Continue current medications and encourage increased water intake. Follow up with a urologist in six weeks. Refilled methenamine as she thought she might be out

## 2024-01-09 NOTE — Assessment & Plan Note (Signed)
 Discontinuation of nifedipine is planned to reduce pill burden. Continue current medications and supplements as prescribed. Discontinue nifedipine.

## 2024-01-09 NOTE — Assessment & Plan Note (Signed)
 She is uncertain if she is taking trospium or vesicare- will let me know, still using a pad daily but no accidents.  Continue(s) with vag estrogen.

## 2024-01-09 NOTE — Assessment & Plan Note (Signed)
 Compression fracture at L1 with significant height loss is managed with physical therapy to maintain mobility and prevent deterioration. She is not experiencing significant pain and is cautious about activities that may worsen her condition. Consider Evenity for bone strengthening once approved - will attempt again on prior authorization. Continue physical therapy.

## 2024-01-09 NOTE — Assessment & Plan Note (Signed)
 Diabetes is well-controlled, benefiting kidney health. She is on Mounjaro 5 mg but prefers the more effective 7.5 mg dose. Attempt to obtain Mounjaro 7.5 mg dose.   We try to get [place] samples when available as insurance won't cover, and we aren't going to try jardiance again until urinary tract infection (UTI) clears.

## 2024-01-09 NOTE — Assessment & Plan Note (Signed)
 Stage 3a chronic kidney disease is present. Proton pump inhibitors pose a risk of kidney damage. Diabetes management is effective in preventing further kidney damage. Chauncey Mann is not covered by insurance due to insufficient kidney damage. Monitor kidney function and consider alternative medications like Farxiga or Jardiance after UTI resolution.

## 2024-01-09 NOTE — Patient Instructions (Addendum)
 It was a pleasure seeing you today! Your health and satisfaction are our top priorities.  Glenetta Hew, MD  Your Providers PCP: Lula Olszewski, MD,  [phone]) Referring Provider: Lula Olszewski, MD,  [phone]) Care Team Provider: Bernette Redbird, MD,  [phone]) Care Team Provider: Suzi Roots Care Team Provider: Domingo Mend,  [phone]) Care Team Provider: Holli Humbles, MD,  [phone]) Care Team Provider: Vladimir Faster, DO,  [phone]) Care Team Provider: Reece Packer, MD Care Team Provider: Eugenia Mcalpine, MD,  [phone]) Care Team Provider: Maeola Harman, MD,  [phone]) Care Team Provider: Dahlia Byes, [facility] Care Team Provider: Pollyann Savoy, MD,  [phone]) Care Team Provider: Jodelle Red, MD,  [phone]) Care Team Provider: Paulina Fusi, NP,  [phone]) Care Team Provider: Paulina Fusi, NP,  [phone])  VISIT SUMMARY:  Today, we discussed your recurrent urinary tract infections, back pain, and other health concerns. You are currently on several medications and treatments, and we reviewed your progress and made some adjustments to your care plan.  YOUR PLAN:  -RECURRENT URINARY TRACT INFECTION (UTI): Recurrent UTIs are being managed with cefdinir 500 mg twice daily, methenamine 1 g twice daily, both with vitamin C, and vaginal estrogen cream. Methenamine helps reduce antibiotic resistance by activating in low pH urine. You should continue your current medications and increase your water intake. Significant improvement is expected within six weeks. Follow up with your urologist in six weeks.  -CHRONIC KIDNEY DISEASE, STAGE 3A: Stage 3a chronic kidney disease means your kidneys are moderately damaged. Proton pump inhibitors can pose a risk to your kidneys, so we will monitor your kidney function closely. Your diabetes management is effective in preventing further  kidney damage. We will consider alternative medications like [place] or Jardiance after your UTI resolves.  -TYPE 2 DIABETES MELLITUS WITH STAGE 3A CHRONIC KIDNEY DISEASE: Your diabetes is well-controlled, which is beneficial for your kidney health. You are currently on Mounjaro 5 mg but prefer the more effective 7.5 mg dose. We will attempt to obtain the Mounjaro 7.5 mg dose for you.  -COMPRESSION FRACTURE PAIN: You have a compression fracture at L1 with significant height loss. Physical therapy is helping to maintain your mobility and prevent further deterioration. You are not experiencing significant pain but should continue to be cautious with activities. We will consider Evenity for bone strengthening once it is approved. Continue with physical therapy.  -HIATAL HERNIA: Your hiatal hernia symptoms have improved with weight loss, but you remain on a proton pump inhibitor, which is not ideal due to your chronic kidney disease. We will monitor your symptoms and consider gradually reducing the proton pump inhibitor. Alternative treatments like Pepcid Complete can be used for as-needed heartburn.  -ESSENTIAL TREMOR: Essential tremor is a condition that causes involuntary shaking. It is being managed with propranolol. Continue taking propranolol as prescribed.  -GENERAL HEALTH MAINTENANCE: We have decided to discontinue nifedipine to reduce your pill burden. Continue taking your current medications and supplements as prescribed.  INSTRUCTIONS:  Follow up with your urologist in six weeks for your recurrent urinary tract infections. Continue attending physical therapy sessions for your back pain. We will monitor your kidney function closely and consider alternative medications for your chronic kidney disease after your UTI resolves. Attempt to obtain Mounjaro 7.5 mg dose for better diabetes management. Monitor your hiatal hernia symptoms and consider reducing the proton pump inhibitor gradually. Continue  taking propranolol for essential tremor as prescribed.   NEXT STEPS: [x]  Early Intervention: Schedule  sooner appointment, call our on-call services, or go to emergency room if there is any significant Increase in pain or discomfort New or worsening symptoms Sudden or severe changes in your health [x]  Flexible Follow-Up: We recommend a No follow-ups on file. for optimal routine care. This allows for progress monitoring and treatment adjustments. [x]  Preventive Care: Schedule your annual preventive care visit! It's typically covered by insurance and helps identify potential health issues early. [x]  Lab & X-ray Appointments: Incomplete tests scheduled today, or call to schedule. X-rays: [facility] (M-F, 8:30am-noon or 1pm-5pm). [x]  Medical Information Release: Sign a release form at front desk to obtain relevant medical information we don't have.  MAKING THE MOST OF OUR FOCUSED 20 MINUTE APPOINTMENTS: [x]   Clearly state your top concerns at the beginning of the visit to focus our discussion [x]   If you anticipate you will need more time, please inform the front desk during scheduling - we can book multiple appointments in the same week. [x]   If you have transportation problems- use our convenient video appointments or ask about transportation support. [x]   We can get down to business faster if you use MyChart to update information before the visit and submit non-urgent questions before your visit. Thank you for taking the time to provide details through MyChart.  Let our nurse know and she can import this information into your encounter documents.  Arrival and Wait Times: [x]   Arriving on time ensures that everyone receives prompt attention. [x]   Early morning (8a) and afternoon (1p) appointments tend to have shortest wait times. [x]   Unfortunately, we cannot delay appointments for late arrivals or hold slots during phone calls.  Getting Answers and Following Up [x]   Simple  Questions & Concerns: For quick questions or basic follow-up after your visit, reach [place] at [phone] or MyChart messaging. [x]   Complex Concerns: If your concern is more complex, scheduling an appointment might be best. Discuss this with the staff to find the most suitable option. [x]   Lab & Imaging Results: We'll contact you directly if results are abnormal or you don't use MyChart. Most normal results will be on MyChart within 2-3 business days, with a review message from Dr. Jon Billings. Haven't heard back in 2 weeks? Need results sooner? Contact us at [phone]. [x]   Referrals: Our referral coordinator will manage specialist referrals. The specialist's office should contact you within 2 weeks to schedule an appointment. Call us if you haven't heard from them after 2 weeks.  Staying Connected [x]   MyChart: Activate your MyChart for the fastest way to access results and message [place]. See the last page of this paperwork for instructions on how to activate.  Bring to Your Next Appointment [x]   Medications: Please bring all your medication bottles to your next appointment to ensure we have an accurate record of your prescriptions. [x]   Health Diaries: If you're monitoring any health conditions at home, keeping a diary of your readings can be very helpful for discussions at your next appointment.  Billing [x]   X-ray & Lab Orders: These are billed by separate companies. Contact the invoicing company directly for questions or concerns. [x]   Visit Charges: Discuss any billing inquiries with our administrative services team.  Your Satisfaction Matters [x]   Share Your Experience: We strive for your satisfaction! If you have any complaints, or preferably compliments, please let Dr. Jon Billings know directly or contact our Practice Administrators, Edwena Felty or [employer], by asking at the front desk.  Reviewing Your Records [x]   Review this early draft of your clinical encounter notes below and  the final encounter summary tomorrow on MyChart after its been completed.  All orders placed so far are visible here: Recurrent UTI -     Methenamine Hippurate; Take 1 tablet (1 g total) by mouth 2 (two) times daily. Take with vitamin C - which activates antibiotic activity in the bladder.  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  Dispense: 2 mL; Refill: 11

## 2024-01-09 NOTE — Assessment & Plan Note (Signed)
 Hiatal hernia symptoms have improved with weight loss, but she remains on a proton pump inhibitor, which is not ideal due to chronic kidney disease. Surgical intervention is not desired. Monitor symptoms and consider gradual reduction of the proton pump inhibitor. Discuss alternative treatments like Pepcid Complete for as-needed heartburn.

## 2024-01-10 DIAGNOSIS — S32010A Wedge compression fracture of first lumbar vertebra, initial encounter for closed fracture: Secondary | ICD-10-CM | POA: Diagnosis not present

## 2024-01-10 DIAGNOSIS — M545 Low back pain, unspecified: Secondary | ICD-10-CM | POA: Diagnosis not present

## 2024-01-10 DIAGNOSIS — M5416 Radiculopathy, lumbar region: Secondary | ICD-10-CM | POA: Diagnosis not present

## 2024-01-11 DIAGNOSIS — Z961 Presence of intraocular lens: Secondary | ICD-10-CM | POA: Diagnosis not present

## 2024-01-11 DIAGNOSIS — Z947 Corneal transplant status: Secondary | ICD-10-CM | POA: Diagnosis not present

## 2024-01-11 DIAGNOSIS — H43811 Vitreous degeneration, right eye: Secondary | ICD-10-CM | POA: Diagnosis not present

## 2024-01-13 MED ORDER — ROMOSOZUMAB-AQQG 105 MG/1.17ML ~~LOC~~ SOSY: 210.0000 mg | PREFILLED_SYRINGE | SUBCUTANEOUS | Status: AC

## 2024-01-13 NOTE — Progress Notes (Signed)
 Evenity ordered and sent to PA Team.

## 2024-01-13 NOTE — Addendum Note (Signed)
 Addended by: Jobe Gibbon on: 01/13/2024 02:46 PM   Modules accepted: Orders

## 2024-01-16 ENCOUNTER — Ambulatory Visit: Payer: HMO | Admitting: Internal Medicine

## 2024-01-17 DIAGNOSIS — M5416 Radiculopathy, lumbar region: Secondary | ICD-10-CM | POA: Diagnosis not present

## 2024-01-17 DIAGNOSIS — M545 Low back pain, unspecified: Secondary | ICD-10-CM | POA: Diagnosis not present

## 2024-01-17 DIAGNOSIS — S32010A Wedge compression fracture of first lumbar vertebra, initial encounter for closed fracture: Secondary | ICD-10-CM | POA: Diagnosis not present

## 2024-01-23 ENCOUNTER — Telehealth: Payer: Self-pay

## 2024-01-23 ENCOUNTER — Other Ambulatory Visit (HOSPITAL_COMMUNITY): Payer: Self-pay

## 2024-01-23 NOTE — Telephone Encounter (Signed)
 Pt ready for scheduling for EVENITY on or after : 01/23/24   Out-of-pocket cost due at time of visit: $482.41   Number of injection/visits approved: 12   Primary: [employer] [place] co-insurance: 20% Admin fee co-insurance: 0%   Secondary: --- Prolia co-insurance:  Admin fee co-insurance:    Medical Benefit Details: Date Benefits were checked: 11/01/23 Deductible: NO/ Coinsurance: 20%/ Admin Fee: 0%   Prior Auth: N/A PA# Expiration Date:  # of doses approved:  Pharmacy benefit: Copay $--- If patient wants fill through the pharmacy benefit please send prescription to:  --- , and include estimated need by date in rx notes. Pharmacy will ship medication directly to the office.   Patient NOT eligible for Evenity Copay Card. Copay Card can make patient's cost as little as $25. Link to apply: [email or web address]://www.amgensupportplus.com/copay   ** This summary of benefits is an estimation of the patient's out-of-pocket cost. Exact cost may very based on individual plan coverage.

## 2024-01-24 ENCOUNTER — Ambulatory Visit (INDEPENDENT_AMBULATORY_CARE_PROVIDER_SITE_OTHER): Payer: HMO | Admitting: Internal Medicine

## 2024-01-24 ENCOUNTER — Encounter: Payer: Self-pay | Admitting: Internal Medicine

## 2024-01-24 VITALS — BP 100/68 | HR 99 | Temp 98.3°F | Ht 62.0 in | Wt 127.4 lb

## 2024-01-24 DIAGNOSIS — S3992XD Unspecified injury of lower back, subsequent encounter: Secondary | ICD-10-CM

## 2024-01-24 DIAGNOSIS — L989 Disorder of the skin and subcutaneous tissue, unspecified: Secondary | ICD-10-CM

## 2024-01-24 DIAGNOSIS — R031 Nonspecific low blood-pressure reading: Secondary | ICD-10-CM | POA: Diagnosis not present

## 2024-01-24 DIAGNOSIS — L719 Rosacea, unspecified: Secondary | ICD-10-CM

## 2024-01-24 DIAGNOSIS — E611 Iron deficiency: Secondary | ICD-10-CM | POA: Diagnosis not present

## 2024-01-24 DIAGNOSIS — N390 Urinary tract infection, site not specified: Secondary | ICD-10-CM

## 2024-01-24 DIAGNOSIS — G4733 Obstructive sleep apnea (adult) (pediatric): Secondary | ICD-10-CM

## 2024-01-24 DIAGNOSIS — E039 Hypothyroidism, unspecified: Secondary | ICD-10-CM

## 2024-01-24 LAB — CBC WITH DIFFERENTIAL/PLATELET
Basophils Absolute: 0 10*3/uL (ref 0.0–0.1)
Basophils Relative: 0.6 % (ref 0.0–3.0)
Eosinophils Absolute: 0.2 10*3/uL (ref 0.0–0.7)
Eosinophils Relative: 4 % (ref 0.0–5.0)
HCT: 38 % (ref 36.0–46.0)
Hemoglobin: 12.9 g/dL (ref 12.0–15.0)
Lymphocytes Relative: 52.8 % — ABNORMAL HIGH (ref 12.0–46.0)
Lymphs Abs: 2.4 10*3/uL (ref 0.7–4.0)
MCHC: 33.9 g/dL (ref 30.0–36.0)
MCV: 95.3 fl (ref 78.0–100.0)
Monocytes Absolute: 0.2 10*3/uL (ref 0.1–1.0)
Monocytes Relative: 5.3 % (ref 3.0–12.0)
Neutro Abs: 1.7 10*3/uL (ref 1.4–7.7)
Neutrophils Relative %: 37.3 % — ABNORMAL LOW (ref 43.0–77.0)
Platelets: 173 10*3/uL (ref 150.0–400.0)
RBC: 3.99 Mil/uL (ref 3.87–5.11)
RDW: 13.5 % (ref 11.5–15.5)
WBC: 4.5 10*3/uL (ref 4.0–10.5)

## 2024-01-24 LAB — BASIC METABOLIC PANEL WITH GFR
BUN: 12 mg/dL (ref 6–23)
CO2: 26 meq/L (ref 19–32)
Calcium: 9.2 mg/dL (ref 8.4–10.5)
Chloride: 106 meq/L (ref 96–112)
Creatinine, Ser: 0.77 mg/dL (ref 0.40–1.20)
GFR: 73.01 mL/min (ref >60.00)
Glucose, Bld: 80 mg/dL (ref 70–99)
Potassium: 4.2 meq/L (ref 3.5–5.1)
Sodium: 140 meq/L (ref 135–145)

## 2024-01-24 LAB — FERRITIN: Ferritin: 28.2 ng/mL (ref 10.0–291.0)

## 2024-01-24 MED ORDER — TIZANIDINE HCL 2 MG PO TABS: 2.0000 mg | ORAL_TABLET | Freq: Four times a day (QID) | ORAL | 1 refills | Status: DC | PRN

## 2024-01-24 NOTE — Assessment & Plan Note (Signed)
 A new lesion on her lip was noticed this week, possibly a bruise or result of biting. Consider referral to a dermatologist for biopsy if the lesion persists.

## 2024-01-24 NOTE — Assessment & Plan Note (Signed)
 Will refer care to dermatology but she already has one. Took image and will reassess in 2 wk

## 2024-01-24 NOTE — Assessment & Plan Note (Signed)
 Will order lab testing to guide management.

## 2024-01-24 NOTE — Patient Instructions (Addendum)
 VISIT SUMMARY:  Today, we discussed your progress after back surgery and reviewed your medication management. Your back condition has improved significantly, and you have reduced your intake of muscle relaxers and Tylenol. We also addressed your low blood pressure, a new lesion on your lip, and general health maintenance.  YOUR PLAN:  -CHRONIC BACK PAIN: Chronic back pain refers to long-term pain in your back. Your back pain has improved with physical therapy, and you have reduced your use of muscle relaxants and Tylenol. You should continue taking tizanidine 2 mg as needed every six hours for pain management. No additional physical therapy is needed at this time.  -HYPOTENSION: Hypotension means low blood pressure. Although you have low blood pressure, you are not experiencing dizziness. We suspect a urinary tract infection might be contributing to this issue. We will evaluate you for a urinary tract infection to determine if it is causing your low blood pressure.  -LIP LESION: A lip lesion is an abnormal area on your lip. You noticed a new lesion on your lip this week, which might be a bruise from biting your lip. If the lesion persists, we may refer you to a dermatologist for a biopsy to determine its nature.  -GENERAL HEALTH MAINTENANCE: Your general health maintenance includes routine check-ups and exams. A foot exam was documented, and you had an eye exam last Wednesday with a follow-up scheduled in six months. We will ensure the documentation of your recent eye exam is received.  INSTRUCTIONS:  Please continue taking tizanidine 2 mg as needed every six hours for back pain. We will evaluate you for a urinary tract infection to determine if it is contributing to your low blood pressure. If the lesion on your lip persists, we may refer you to a dermatologist for further evaluation. Ensure you attend your follow-up eye exam in six months.   It was a pleasure seeing you today! Your health and  satisfaction are our top priorities.  Glenetta Hew, MD  Your Providers PCP: Lula Olszewski, MD,  [phone]) Referring Provider: Lula Olszewski, MD,  [phone]) Care Team Provider: Bernette Redbird, MD,  [phone]) Care Team Provider: Suzi Roots Care Team Provider: Domingo Mend,  [phone]) Care Team Provider: Holli Humbles, MD,  [phone]) Care Team Provider: Vladimir Faster, DO,  [phone]) Care Team Provider: Reece Packer, MD Care Team Provider: Eugenia Mcalpine, MD,  [phone]) Care Team Provider: Maeola Harman, MD,  [phone]) Care Team Provider: Dahlia Byes, [facility] Care Team Provider: Pollyann Savoy, MD,  [phone]) Care Team Provider: Jodelle Red, MD,  [phone]) Care Team Provider: Paulina Fusi, NP,  [phone]) Care Team Provider: Paulina Fusi, NP,  [phone])     NEXT STEPS: [x]  Early Intervention: Schedule sooner appointment, call our on-call services, or go to emergency room if there is any significant Increase in pain or discomfort New or worsening symptoms Sudden or severe changes in your health [x]  Flexible Follow-Up: We recommend a Return in about 2 weeks (around 02/07/2024). for optimal routine care. This allows for progress monitoring and treatment adjustments. [x]  Preventive Care: Schedule your annual preventive care visit! It's typically covered by insurance and helps identify potential health issues early. [x]  Lab & X-ray Appointments: Incomplete tests scheduled today, or call to schedule. X-rays: [facility] (M-F, 8:30am-noon or 1pm-5pm). [x]  Medical Information Release: Sign a release form at front desk to obtain relevant medical information we don't have.  MAKING THE MOST OF OUR FOCUSED 20 MINUTE APPOINTMENTS: [  x]  Clearly state your top concerns at the beginning of the visit to focus our discussion [x]   If you anticipate you will need  more time, please inform the front desk during scheduling - we can book multiple appointments in the same week. [x]   If you have transportation problems- use our convenient video appointments or ask about transportation support. [x]   We can get down to business faster if you use MyChart to update information before the visit and submit non-urgent questions before your visit. Thank you for taking the time to provide details through MyChart.  Let our nurse know and she can import this information into your encounter documents.  Arrival and Wait Times: [x]   Arriving on time ensures that everyone receives prompt attention. [x]   Early morning (8a) and afternoon (1p) appointments tend to have shortest wait times. [x]   Unfortunately, we cannot delay appointments for late arrivals or hold slots during phone calls.  Getting Answers and Following Up [x]   Simple Questions & Concerns: For quick questions or basic follow-up after your visit, reach [place] at [phone] or MyChart messaging. [x]   Complex Concerns: If your concern is more complex, scheduling an appointment might be best. Discuss this with the staff to find the most suitable option. [x]   Lab & Imaging Results: We'll contact you directly if results are abnormal or you don't use MyChart. Most normal results will be on MyChart within 2-3 business days, with a review message from Dr. Jon Billings. Haven't heard back in 2 weeks? Need results sooner? Contact us at [phone]. [x]   Referrals: Our referral coordinator will manage specialist referrals. The specialist's office should contact you within 2 weeks to schedule an appointment. Call us if you haven't heard from them after 2 weeks.  Staying Connected [x]   MyChart: Activate your MyChart for the fastest way to access results and message [place]. See the last page of this paperwork for instructions on how to activate.  Bring to Your Next Appointment [x]   Medications: Please bring all your medication  bottles to your next appointment to ensure we have an accurate record of your prescriptions. [x]   Health Diaries: If you're monitoring any health conditions at home, keeping a diary of your readings can be very helpful for discussions at your next appointment.  Billing [x]   X-ray & Lab Orders: These are billed by separate companies. Contact the invoicing company directly for questions or concerns. [x]   Visit Charges: Discuss any billing inquiries with our administrative services team.  Your Satisfaction Matters [x]   Share Your Experience: We strive for your satisfaction! If you have any complaints, or preferably compliments, please let Dr. Jon Billings know directly or contact our Practice Administrators, Edwena Felty or [employer], by asking at the front desk.   Reviewing Your Records [x]   Review this early draft of your clinical encounter notes below and the final encounter summary tomorrow on MyChart after its been completed.  All orders placed so far are visible here: Iron deficiency -     CBC with Differential/Platelet -     Ferritin  Skin lesion  Recurrent UTI -     Urinalysis w microscopic + reflex cultur -     Basic metabolic panel with GFR  Injury of back, subsequent encounter -     tiZANidine HCl; Take 1 tablet (2 mg total) by mouth every 6 (six) hours as needed for muscle spasms.  Dispense: 100 tablet; Refill: 1  Rosacea  Acquired hypothyroidism -     TSH +  free T4  OSA (obstructive sleep apnea)  Low blood pressure reading
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
## 2024-01-24 NOTE — Assessment & Plan Note (Signed)
 Her back pain has improved since starting physical therapy, with reduced use of muscle relaxants and Tylenol. Tramadol is not used. She occasionally uses tizanidine for pain management, and gabapentin was discontinued due to ineffectiveness. Continue tizanidine 2 mg as needed every six hours for back pain, with a prescription for 100 tablets and a refill. No additional physical therapy is needed at this time.

## 2024-01-25 DIAGNOSIS — N390 Urinary tract infection, site not specified: Secondary | ICD-10-CM | POA: Diagnosis not present

## 2024-01-25 DIAGNOSIS — E1122 Type 2 diabetes mellitus with diabetic chronic kidney disease: Secondary | ICD-10-CM | POA: Diagnosis not present

## 2024-01-25 DIAGNOSIS — N182 Chronic kidney disease, stage 2 (mild): Secondary | ICD-10-CM | POA: Diagnosis not present

## 2024-01-25 DIAGNOSIS — I7300 Raynaud's syndrome without gangrene: Secondary | ICD-10-CM | POA: Diagnosis not present

## 2024-01-25 DIAGNOSIS — R319 Hematuria, unspecified: Secondary | ICD-10-CM | POA: Diagnosis not present

## 2024-01-25 DIAGNOSIS — E875 Hyperkalemia: Secondary | ICD-10-CM | POA: Diagnosis not present

## 2024-01-25 DIAGNOSIS — N3946 Mixed incontinence: Secondary | ICD-10-CM | POA: Diagnosis not present

## 2024-01-25 DIAGNOSIS — N200 Calculus of kidney: Secondary | ICD-10-CM | POA: Diagnosis not present

## 2024-01-25 DIAGNOSIS — N281 Cyst of kidney, acquired: Secondary | ICD-10-CM | POA: Diagnosis not present

## 2024-01-26 LAB — URINALYSIS W MICROSCOPIC + REFLEX CULTURE
Bilirubin Urine: NEGATIVE
Glucose, UA: NEGATIVE
Hgb urine dipstick: NEGATIVE
Hyaline Cast: NONE SEEN /LPF
Ketones, ur: NEGATIVE
Nitrites, Initial: NEGATIVE
Protein, ur: NEGATIVE
RBC / HPF: NONE SEEN /HPF (ref 0–2)
Specific Gravity, Urine: 1.018 (ref 1.001–1.035)
pH: 5.5 (ref 5.0–8.0)

## 2024-01-26 LAB — TSH+FREE T4: TSH W/REFLEX TO FT4: 2.79 m[IU]/L (ref 0.40–4.50)

## 2024-01-26 LAB — CULTURE INDICATED

## 2024-01-26 LAB — URINE CULTURE
MICRO NUMBER:: 16334965
Result:: NO GROWTH
SPECIMEN QUALITY:: ADEQUATE

## 2024-01-28 NOTE — Progress Notes (Signed)
 Urine is looking just very mildly infected still.  For the last 2 months its had much less infection than  all last year so I think its improvement without total resolution. Labs are stable/ normal except iron  has been slightly low on most checks - need iron  rich diet:  ________________________________________ 1. Top heme iron  foods (the kind your body absorbs most easily) Food Typical Serving Iron  (mg)Please see interpretation written in the Patient Results Comments. Anthon Kins, MD 01/28/2024 12:05 PM   Beef liver (cooked) 3 oz (85 g) 5-6 mg Lean beef steak, top sirloin 3 oz 2-3 mg Dark meat [place] 3 oz 2 mg Chicken thigh 3 oz 1 mg Canned light tuna 3 oz 1 mg Oysters, clams, mussels 3 oz 3-8 mg (varies) How to eat them: Saut beef liver with onions + squeeze of lemon, top sirloin stir fry with bell peppers, or a [place] and spinach wrap with sliced fresh strawberries. ________________________________________ 2. Top non heme iron  foods (plant based) Food Typical Serving Iron  (mg) Fortified breakfast cereal 1 cup 8-18 mg* Tofu, firm  cup 3 mg Lentils, cooked  cup 3 mg White beans, cooked  cup 3 mg Chickpeas, cooked  cup 2 mg Pumpkin seeds (pepitas)  cup 2 mg Quinoa, cooked 1 cup 2 mg Spinach, cooked  cup 3 mg *Check the label-fortified cereals vary widely. Pro tip: Pair any of these with a vitamin C booster (see next section) to 2-3 the amount of iron  you absorb. ________________________________________ 3. Quick vitamin C boosters Food Serving Vitamin C Red bell pepper (raw)  cup 95 mg Kiwi 1 medium 65 mg Orange 1 medium 70 mg Strawberries  cup 45 mg Pineapple chunks  cup 40 mg Broccoli (lightly steamed)  cup 50 mg Tomato or tomato juice 1 medium /  cup 15-20 mg Lemon or lime juice 2 Tbsp 15 mg Aiming for at least 30 mg of vitamin C at the same meal is enough to significantly improve iron  uptake. ________________________________________ 4. How to build iron   smart meals Meal Idea Iron  Source Vitamin C Partner Oatmeal + raisins + pepitas Raisins, pepitas Fresh orange slices Lentil soup Lentils Squeeze of lemon + side of red pepper strips Tofu stir fry Tofu, spinach Broccoli + bell peppers Roast beef sandwich Lean beef Tomato slices + kiwi dessert Chickpea salad wrap Chickpeas Shredded cabbage, pineapple chunks Overnight cereal cup Fortified cereal Strawberries mixed in ________________________________________ 5. Additional tips for better iron  status 1. Cook in a cast iron  skillet-adds up to 1-2 mg iron  per meal, especially with acidic foods like tomato sauce. 2. Avoid coffee/tea with iron  rich meals-tannins inhibit absorption; wait at least an hour. 3. Calcium  competes with iron -separate high calcium  supplements or large dairy portions from iron  focused meals. 4. Check labels on plant milks and cereals-many are iron  fortified and can help fill gaps. 5. Consider a supplement only if prescribed-too much iron  can be harmful; labs guide dosing. ________________________________________ Daily targets (general guide) Group RDA* Premenopausal women 18 mg/day Postmenopausal women & adult men 8 mg/day Pregnancy 27 mg/day *Tailor to your patient's lab values and clinical context. ________________________________________ Ellen Guppy line Include one heme or high non heme iron  source at each meal and always pair plant sources with vitamin C rich produce. With consistent choices like the lists above, most people can raise iron  levels naturally while enjoying varied, nutrient dense meals.

## 2024-01-30 ENCOUNTER — Other Ambulatory Visit: Payer: Self-pay | Admitting: Internal Medicine

## 2024-01-30 DIAGNOSIS — K219 Gastro-esophageal reflux disease without esophagitis: Secondary | ICD-10-CM

## 2024-01-30 DIAGNOSIS — S32010A Wedge compression fracture of first lumbar vertebra, initial encounter for closed fracture: Secondary | ICD-10-CM

## 2024-01-31 ENCOUNTER — Other Ambulatory Visit: Payer: Self-pay | Admitting: Internal Medicine

## 2024-01-31 ENCOUNTER — Ambulatory Visit: Payer: HMO | Admitting: Internal Medicine

## 2024-01-31 DIAGNOSIS — S32010A Wedge compression fracture of first lumbar vertebra, initial encounter for closed fracture: Secondary | ICD-10-CM

## 2024-02-06 ENCOUNTER — Ambulatory Visit: Admitting: Internal Medicine

## 2024-02-07 ENCOUNTER — Encounter: Payer: Self-pay | Admitting: Internal Medicine

## 2024-02-07 ENCOUNTER — Other Ambulatory Visit (HOSPITAL_COMMUNITY): Payer: Self-pay

## 2024-02-07 ENCOUNTER — Ambulatory Visit (INDEPENDENT_AMBULATORY_CARE_PROVIDER_SITE_OTHER): Payer: HMO | Admitting: Internal Medicine

## 2024-02-07 ENCOUNTER — Telehealth: Payer: Self-pay | Admitting: Pharmacy Technician

## 2024-02-07 VITALS — BP 110/64 | HR 58 | Temp 97.6°F | Ht 62.0 in | Wt 126.0 lb

## 2024-02-07 DIAGNOSIS — R809 Proteinuria, unspecified: Secondary | ICD-10-CM

## 2024-02-07 DIAGNOSIS — M549 Dorsalgia, unspecified: Secondary | ICD-10-CM

## 2024-02-07 DIAGNOSIS — G8929 Other chronic pain: Secondary | ICD-10-CM | POA: Diagnosis not present

## 2024-02-07 DIAGNOSIS — E1129 Type 2 diabetes mellitus with other diabetic kidney complication: Secondary | ICD-10-CM | POA: Diagnosis not present

## 2024-02-07 MED ORDER — METHYLPREDNISOLONE 4 MG PO TBPK: ORAL_TABLET | ORAL | 0 refills | Status: DC

## 2024-02-07 MED ORDER — KERENDIA 20 MG PO TABS: 20.0000 mg | ORAL_TABLET | Freq: Every day | ORAL | 3 refills | Status: DC

## 2024-02-07 NOTE — Patient Instructions (Signed)
 VISIT SUMMARY:  Today, you were seen for severe back pain that worsened after using a motorized percussion massager. The pain is located in the area of your previous compression fracture at L1 and is accompanied by stiffness and muscle spasms. You have been using methocarbamol  and extra strength Tylenol  for pain relief.  YOUR PLAN:  -DEGENERATIVE DISC DISEASE WITH MUSCLE SPASM: You are experiencing a flare-up of chronic back pain due to muscle spasms, likely related to degenerative disc disease. This condition involves the breakdown of the discs in your spine, which can cause pain and muscle spasms. We recommend avoiding percussion massage devices and instead using ice and heat therapy to help with inflammation and muscle relaxation. You may take gabapentin  100-300 mg up to three times a day as needed for pain, but avoid using it when driving. A Medrol  Dosepak is prescribed for inflammation control, but be cautious if you have diabetes. Continue using Tylenol  for pain management. We also suggest physical therapy for long-term management and gentle stretching exercises. Acupuncture and cupping are recommended as alternatives to deep tissue massage.  -COMPRESSION FRACTURE OF L1: There is no new compression fracture identified, and the pain is localized below the previous fracture site. A compression fracture is a small break in the vertebrae of your spine. We believe the percussion massage did not cause a new fracture since there was no immediate pain during its use. Please monitor for any new or worsening symptoms, such as loss of bowel or bladder control. You have a follow-up appointment with a neurosurgeon next week, who will perform x-rays for further evaluation.  INSTRUCTIONS:  Please follow up with the neurosurgeon next week for further evaluation and x-rays. Continue using your cane for mobility support and avoid using the percussion massage device. If you experience any new or worsening symptoms,  such as loss of bowel or bladder control, seek medical attention immediately.

## 2024-02-07 NOTE — Progress Notes (Signed)
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 encounter employed state-of-the-art, real-time, collaborative documentation. The patient actively reviewed and assisted in updating their electronic medical record on a shared screen, ensuring transparency and facilitating joint problem-solving for the problem list, overview, and plan. This approach promotes accurate, informed care. The treatment plan was discussed and reviewed in detail, including medication safety, potential side effects, and all patient questions. We confirmed understanding and comfort with the plan. Follow-up instructions were established, including contacting the office for any concerns, returning if symptoms worsen, persist, or new symptoms develop, and precautions for potential emergency department visits.

## 2024-02-07 NOTE — Telephone Encounter (Signed)
 Pharmacy Patient Advocate Encounter  Received notification from [facility] that Prior Authorization for Kerendia  20MG  tablets has been DENIED.  SEE TELEPHONE ENCOUNTER FROM 11/10/2023.   PA #/Case ID/Reference #: [id number]   Patient had a new prescription issued today. Please advise.

## 2024-02-10 ENCOUNTER — Ambulatory Visit: Admitting: Internal Medicine

## 2024-02-14 DIAGNOSIS — T07XXXA Unspecified multiple injuries, initial encounter: Secondary | ICD-10-CM | POA: Diagnosis not present

## 2024-02-14 DIAGNOSIS — S0086XA Insect bite (nonvenomous) of other part of head, initial encounter: Secondary | ICD-10-CM | POA: Diagnosis not present

## 2024-02-14 DIAGNOSIS — L538 Other specified erythematous conditions: Secondary | ICD-10-CM | POA: Diagnosis not present

## 2024-02-14 DIAGNOSIS — L2989 Other pruritus: Secondary | ICD-10-CM | POA: Diagnosis not present

## 2024-02-14 DIAGNOSIS — R208 Other disturbances of skin sensation: Secondary | ICD-10-CM | POA: Diagnosis not present

## 2024-02-14 DIAGNOSIS — L820 Inflamed seborrheic keratosis: Secondary | ICD-10-CM | POA: Diagnosis not present

## 2024-02-14 DIAGNOSIS — R233 Spontaneous ecchymoses: Secondary | ICD-10-CM | POA: Diagnosis not present

## 2024-02-15 ENCOUNTER — Ambulatory Visit: Admitting: Internal Medicine

## 2024-02-16 DIAGNOSIS — N958 Other specified menopausal and perimenopausal disorders: Secondary | ICD-10-CM | POA: Diagnosis not present

## 2024-02-16 DIAGNOSIS — R399 Unspecified symptoms and signs involving the genitourinary system: Secondary | ICD-10-CM | POA: Diagnosis not present

## 2024-02-16 DIAGNOSIS — N390 Urinary tract infection, site not specified: Secondary | ICD-10-CM | POA: Diagnosis not present

## 2024-02-16 DIAGNOSIS — N3946 Mixed incontinence: Secondary | ICD-10-CM | POA: Diagnosis not present

## 2024-02-17 ENCOUNTER — Other Ambulatory Visit: Payer: Self-pay | Admitting: Internal Medicine

## 2024-02-17 ENCOUNTER — Other Ambulatory Visit: Payer: Self-pay | Admitting: Neurology

## 2024-02-17 DIAGNOSIS — G250 Essential tremor: Secondary | ICD-10-CM

## 2024-02-17 NOTE — Telephone Encounter (Signed)
 Sent in 300 tabs to get to 04/04/24 appointment

## 2024-02-17 NOTE — Telephone Encounter (Signed)
 This patient has not been seen in a year and has a refill request in. Can you please call and get her scheduled Thank You

## 2024-02-19 ENCOUNTER — Other Ambulatory Visit: Payer: Self-pay | Admitting: Internal Medicine

## 2024-02-19 ENCOUNTER — Other Ambulatory Visit: Payer: Self-pay | Admitting: Neurology

## 2024-02-19 DIAGNOSIS — R251 Tremor, unspecified: Secondary | ICD-10-CM

## 2024-02-22 ENCOUNTER — Ambulatory Visit: Admitting: Physical Therapy

## 2024-02-22 ENCOUNTER — Ambulatory Visit: Admitting: Internal Medicine

## 2024-02-27 ENCOUNTER — Ambulatory Visit: Admitting: Internal Medicine

## 2024-02-28 ENCOUNTER — Ambulatory Visit (INDEPENDENT_AMBULATORY_CARE_PROVIDER_SITE_OTHER): Admitting: Internal Medicine

## 2024-02-28 ENCOUNTER — Encounter: Payer: Self-pay | Admitting: Internal Medicine

## 2024-02-28 VITALS — BP 110/60 | HR 67 | Temp 98.0°F | Ht 62.0 in | Wt 130.4 lb

## 2024-02-28 DIAGNOSIS — R809 Proteinuria, unspecified: Secondary | ICD-10-CM

## 2024-02-28 DIAGNOSIS — E1129 Type 2 diabetes mellitus with other diabetic kidney complication: Secondary | ICD-10-CM

## 2024-02-28 DIAGNOSIS — N3946 Mixed incontinence: Secondary | ICD-10-CM | POA: Diagnosis not present

## 2024-02-28 DIAGNOSIS — G8929 Other chronic pain: Secondary | ICD-10-CM

## 2024-02-28 DIAGNOSIS — M549 Dorsalgia, unspecified: Secondary | ICD-10-CM | POA: Diagnosis not present

## 2024-02-28 DIAGNOSIS — R251 Tremor, unspecified: Secondary | ICD-10-CM | POA: Diagnosis not present

## 2024-02-28 DIAGNOSIS — S32010A Wedge compression fracture of first lumbar vertebra, initial encounter for closed fracture: Secondary | ICD-10-CM

## 2024-02-28 DIAGNOSIS — N390 Urinary tract infection, site not specified: Secondary | ICD-10-CM

## 2024-02-28 DIAGNOSIS — M8000XS Age-related osteoporosis with current pathological fracture, unspecified site, sequela: Secondary | ICD-10-CM

## 2024-02-28 DIAGNOSIS — Z7985 Long-term (current) use of injectable non-insulin antidiabetic drugs: Secondary | ICD-10-CM

## 2024-02-28 DIAGNOSIS — I700 Atherosclerosis of aorta: Secondary | ICD-10-CM

## 2024-02-28 DIAGNOSIS — L282 Other prurigo: Secondary | ICD-10-CM

## 2024-02-28 MED ORDER — KERENDIA 20 MG PO TABS: 20.0000 mg | ORAL_TABLET | Freq: Every day | ORAL | 3 refills | Status: DC

## 2024-02-28 MED ORDER — METHENAMINE HIPPURATE 1 G PO TABS: 1.0000 g | ORAL_TABLET | Freq: Two times a day (BID) | ORAL | 3 refills | Status: AC

## 2024-02-28 MED ORDER — MOUNJARO 7.5 MG/0.5ML ~~LOC~~ SOAJ: 7.5000 mg | SUBCUTANEOUS | 11 refills | Status: DC

## 2024-02-28 NOTE — Assessment & Plan Note (Signed)
 Propranolol  and primidone  manage her condition. Continued neurologist visits are recommended for specialized management and confirmation of diagnosis. The neurologist can provide further testing to ensure the tremor is benign. Continue propranolol  and primidone . Follow up with neurologist to confirm diagnosis and management plan.

## 2024-02-28 NOTE — Progress Notes (Signed)
 Assessment/Plan:    1.  Tremor  -Her tremor has been difficult, as objective tremor findings have been incongruent with subjective complaints.  We have archimedes spirals back to 2014 and they are nl/unremarkable  -Continue primidone , 50 mg, 2 in the AM, 4 in the PM  -Continue propranolol , 10 mg 2 times per day.   -no evidence of dystonia  -don't want to start topamax  d/t remote hx of nephrolithiasis  -already on gabapentin  600 mg daily for hot flashes (was bid).  Could try going to bid again and see if helps tremor  -she asks about Parkinsons Disease and reassured her she doesn't have this and hasn't over the 10 years I have seen her.  Cannot guarantee she will never get it but she certainly has no features today  2.  LBP  -following with Dr. Pleasant Brilliant  Subjective:   Laura Farrell was seen today in follow up for tremor.  My previous records were reviewed prior to todays visit.  She last saw her primary care physician yesterday and I did review his notes.  She continues on primidone  and propranolol .  She feels tremor is not well-controlled. She was on gabapentin  (for hot flashes) but that was stopped about 6 weeks.  She didn't notice any change with d/c the gabapentin .  She notes tremor in the L ring and pinky finger, most bothersome when she is stressed.  It does not affect ability to eat, drink, cook.  It comes and goes.  Her handwriting is "terrible" at baseline.  No passing out spells.  She has had falls - she was volunteering at [employer] and got tangled up in the clothes.  One other fall there as well.  Following with Dr. Larrie Po for her LBP.  Current prescribed movement disorder medications: Propranolol , 10 mg twice per day Primidone , 50 mg, 2 tablets in the morning, 4 tablets at night   Prior meds: gabapentin :On gabapentin  600 mg daily hot flashes)  ALLERGIES:   Allergies  Allergen Reactions   Other Other (See Comments)    Liquid medication    CURRENT  MEDICATIONS:  Outpatient Encounter Medications as of 02/29/2024  Medication Sig   aspirin EC 81 MG tablet 81 mg daily.   calcitonin, salmon, (MIACALCIN/FORTICAL) 200 UNIT/ACT nasal spray Place 1 spray into alternate nostrils daily.   Cranberry Extract 250 MG TABS Take 1 tablet by mouth daily at 6 (six) AM.   cyanocobalamin  (VITAMIN B12) 1000 MCG tablet Take 1,000 mcg by mouth daily.   estradiol  (ESTRACE ) 0.1 MG/GM vaginal cream Place 1 Applicatorful vaginally 3 (three) times a week.   Finerenone  (KERENDIA ) 20 MG TABS Take 1 tablet (20 mg total) by mouth daily.   folic acid  (FOLVITE ) 1 MG tablet Take 1 mg by mouth daily.   gabapentin  (NEURONTIN ) 600 MG tablet TAKE 1 TABLET BY MOUTH 2 TIMES A DAY   levothyroxine  (SYNTHROID ) 88 MCG tablet Take 1 tablet (88 mcg total) by mouth daily.   methenamine  (HIPREX ) 1 g tablet Take 1 tablet (1 g total) by mouth 2 (two) times daily. Take with vitamin C - which activates antibiotic activity in the bladder.   methocarbamol  (ROBAXIN ) 500 MG tablet Take 1 tablet (500 mg total) by mouth 4 (four) times daily.   methylPREDNISolone  (MEDROL  DOSEPAK) 4 MG TBPK tablet Use as directed.   metroNIDAZOLE  (METROGEL ) 0.75 % gel Apply topically.   MOUNJARO  7.5 MG/0.5ML Pen Inject 7.5 mg into the skin once a week.   Multiple Vitamins-Minerals (  CENTRUM SILVER PO) Take by mouth.   omeprazole  (PRILOSEC) 10 MG capsule Take 1 capsule (10 mg total) by mouth daily. Use to taper off as tolerated, to just as needed Pepcid  complete   polycarbophil (FIBERCON) 625 MG tablet Take 2 tablets (1,250 mg total) by mouth daily.   primidone  (MYSOLINE ) 50 MG tablet TAKE 2 TABLETS BY MOUTH EVERY MORNING TAKE FOUR TABLETS BY MOUTH EVERY EVENING   Probiotic Product (PROBIOTIC DAILY PO) Take by mouth.   propranolol  (INDERAL ) 10 MG tablet Take 1 tablet (10 mg total) by mouth 2 (two) times daily.   rosuvastatin  (CRESTOR ) 40 MG tablet Take 1 tablet (40 mg total) by mouth daily.   tiZANidine  (ZANAFLEX ) 2  MG tablet Take 1 tablet (2 mg total) by mouth every 6 (six) hours as needed for muscle spasms.   traMADol  (ULTRAM ) 50 MG tablet Take 50 mg by mouth every 6 (six) hours as needed.   trospium (SANCTURA) 20 MG tablet Take 20 mg by mouth 2 (two) times daily.   venlafaxine  XR (EFFEXOR  XR) 75 MG 24 hr capsule Take 1 capsule (75 mg total) by mouth daily with breakfast.   Facility-Administered Encounter Medications as of 02/29/2024  Medication   Romosozumab -aqqg (EVENITY ) 105 MG/1. injection 210 mg   Romosozumab -aqqg (EVENITY ) 105 MG/1. injection 210 mg     Objective:    PHYSICAL EXAMINATION:    VITALS:   Vitals:   02/29/24 0806  BP: 106/70  Pulse: 73  SpO2: 98%  Weight: 131 lb 3.2 oz (59.5 kg)  Height: 5\' 4"  (1.626 m)    GEN:  The patient appears stated age and is in NAD. HEENT:  Normocephalic, atraumatic.  The mucous membranes are moist.   Neurological examination:  Orientation: The patient is alert and oriented x3. Cranial nerves: There is good facial symmetry. The speech is fluent and clear.  Soft palate rises symmetrically and there is no tongue deviation. Hearing is intact to conversational tone. Sensation: Sensation is intact to light touch throughout Motor: Strength is at least antigravity x4.  Movement examination: Tone: There is normal tone in the UE/LE Abnormal movements: she has no rest tremor.  No postural or intention tremor.  she has no difficulty with archimedes spirals.  She is able to write a sentence without any dystonic posturing.  Coordination:  There is no decremation with RAM's, with any form of RAMS, including alternating supination and pronation of the forearm, hand opening and closing, finger taps, heel taps and toe taps.  Gait and Station: The patient has no difficulty arising out of a deep-seated chair without the use of the hands. The patient's stride length is good with good arm swing I have reviewed and interpreted the following labs  independently   Chemistry      Component Value Date/Time   NA 140 01/24/2024 0828   NA 141 09/13/2016 0803   K 4.2 01/24/2024 0828   K 4.4 09/13/2016 0803   CL 106 01/24/2024 0828   CO2 26 01/24/2024 0828   CO2 23 09/13/2016 0803   BUN 12 01/24/2024 0828   BUN 15.8 09/13/2016 0803   CREATININE 0.77 01/24/2024 0828   CREATININE 0.92 09/26/2023 1418   CREATININE 0.9 09/13/2016 0803      Component Value Date/Time   CALCIUM  9.2 01/24/2024 0828   CALCIUM  9.8 09/13/2016 0803   ALKPHOS 53 12/20/2023 0825   ALKPHOS 73 09/13/2016 0803   AST 16 12/20/2023 0825   AST 20 09/13/2016 0803   ALT 13  12/20/2023 0825   ALT 19 09/13/2016 0803   BILITOT 0.3 12/20/2023 0825   BILITOT 0.25 09/13/2016 0803     Lab Results  Component Value Date   TSH 1.430 10/31/2023   Total time spent on today's visit was 20 minutes, including both face-to-face time and nonface-to-face time.  Time included that spent on review of records (prior notes available to me/labs/imaging if pertinent), discussing treatment and goals, answering patient's questions and coordinating care.   Cc:  Anthon Kins, MD

## 2024-02-28 NOTE — Assessment & Plan Note (Signed)
 Acupuncture has significantly improved her condition. She reports new gnawing pain in the left buttocks, possibly related to sciatica or cholesterol medication side effects. The pain does not radiate to the leg or groin and does not worsen with pressure. Continue current pain management regimen.

## 2024-02-28 NOTE — Assessment & Plan Note (Signed)
 Managed with methenamine  and improved vaginal health. Recent urine tests show improvement, and the urologist's plan appears effective. If symptoms resolve, further follow-up with the urologist may not be needed. Continue methenamine .

## 2024-02-28 NOTE — Progress Notes (Signed)
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 patient questions. We confirmed understanding and comfort with the plan. Follow-up instructions were established, including contacting the office for any concerns, returning if symptoms worsen, persist, or new symptoms develop, and precautions for potential emergency department visits.

## 2024-02-28 NOTE — Assessment & Plan Note (Signed)
 May consider alternative vs resubmit for evenity  after CT coronary calcium  score

## 2024-02-28 NOTE — Patient Instructions (Addendum)
 It was a pleasure seeing you today! Your health and satisfaction are our top priorities.  Laura Curt, MD  Your Providers PCP: Anthon Kins, MD,  [phone]) Referring Provider: Anthon Kins, MD,  [phone]) Care Team Provider: Lanita Pitman, MD,  [phone]) Care Team Provider: Reggy Capers Care Team Provider: Acie Acosta,  [phone]) Care Team Provider: Ladon Pickler, MD,  [phone]) Care Team Provider: Shirline Dover, DO,  [phone]) Care Team Provider: Ona Bidding, MD Care Team Provider: Genevie Kerns, MD,  [phone]) Care Team Provider: Manya Sells, MD,  [phone]) Care Team Provider: Rolando Cliche, [facility] Care Team Provider: Nicholas Bari, MD,  [phone]) Care Team Provider: Sheryle Donning, MD,  [phone]) Care Team Provider: Fadeyi, Oluwatoyin Alaba, NP,  [phone]) Care Team Provider: Fadeyi, Oluwatoyin Alaba, NP,  [phone])     NEXT STEPS: [x]  Early Intervention: Schedule sooner appointment, call our on-call services, or go to emergency room if there is any significant Increase in pain or discomfort New or worsening symptoms Sudden or severe changes in your health [x]  Flexible Follow-Up: We recommend a No follow-ups on file. for optimal routine care. This allows for progress monitoring and treatment adjustments. [x]  Preventive Care: Schedule your annual preventive care visit! It's typically covered by insurance and helps identify potential health issues early. [x]  Lab & X-ray Appointments: Incomplete tests scheduled today, or call to schedule. X-rays: [facility] (M-F, 8:30am-noon or 1pm-5pm). [x]  Medical Information Release: Sign a release form at front desk to obtain relevant medical information we don't have.  MAKING THE MOST OF OUR FOCUSED 20 MINUTE APPOINTMENTS: [x]   Clearly state your top concerns at the beginning of the visit to focus our discussion [x]    If you anticipate you will need more time, please inform the front desk during scheduling - we can book multiple appointments in the same week. [x]   If you have transportation problems- use our convenient video appointments or ask about transportation support. [x]   We can get down to business faster if you use MyChart to update information before the visit and submit non-urgent questions before your visit. Thank you for taking the time to provide details through MyChart.  Let our nurse know and she can import this information into your encounter documents.  Arrival and Wait Times: [x]   Arriving on time ensures that everyone receives prompt attention. [x]   Early morning (8a) and afternoon (1p) appointments tend to have shortest wait times. [x]   Unfortunately, we cannot delay appointments for late arrivals or hold slots during phone calls.  Getting Answers and Following Up [x]   Simple Questions & Concerns: For quick questions or basic follow-up after your visit, reach us  at [phone] or MyChart messaging. [x]   Complex Concerns: If your concern is more complex, scheduling an appointment might be best. Discuss this with the staff to find the most suitable option. [x]   Lab & Imaging Results: We'll contact you directly if results are abnormal or you don't use MyChart. Most normal results will be on MyChart within 2-3 business days, with a review message from Dr. Boston Byers. Haven't heard back in 2 weeks? Need results sooner? Contact us  at [phone]. [x]   Referrals: Our referral coordinator will manage specialist referrals. The specialist's office should contact you within 2 weeks to schedule an appointment. Call us  if you haven't heard from them after 2 weeks.  Staying Connected [x]   MyChart: Activate your MyChart for the fastest way to access results and message us . See the last  page of this paperwork for instructions on how to activate.  Bring to Your Next Appointment [x]   Medications: Please  bring all your medication bottles to your next appointment to ensure we have an accurate record of your prescriptions. [x]   Health Diaries: If you're monitoring any health conditions at home, keeping a diary of your readings can be very helpful for discussions at your next appointment.  Billing [x]   X-ray & Lab Orders: These are billed by separate companies. Contact the invoicing company directly for questions or concerns. [x]   Visit Charges: Discuss any billing inquiries with our administrative services team.  Your Satisfaction Matters [x]   Share Your Experience: We strive for your satisfaction! If you have any complaints, or preferably compliments, please let Dr. Boston Byers know directly or contact our Practice Administrators, Olinda Bertrand or [employer], by asking at the front desk.   Reviewing Your Records [x]   Review this early draft of your clinical encounter notes below and the final encounter summary tomorrow on MyChart after its been completed.  All orders placed so far are visible here: Diabetes mellitus with proteinuria (HCC) -     Kerendia ; Take 1 tablet (20 mg total) by mouth daily.  Dispense: 90 tablet; Refill: 3 -     Mounjaro ; Inject 7.5 mg into the skin once a week.  Dispense: 2 mL; Refill: 11 -     CT CARDIAC SCORING (SELF PAY ONLY); Future  Recurrent UTI Assessment & Plan: Managed with methenamine  and improved vaginal health. Recent urine tests show improvement, and the urologist's plan appears effective. If symptoms resolve, further follow-up with the urologist may not be needed. Continue methenamine .  Orders: -     Methenamine  Hippurate; Take 1 tablet (1 g total) by mouth 2 (two) times daily. Take with vitamin C - which activates antibiotic activity in the bladder.  Dispense: 180 tablet; Refill: 3  Closed compression fracture of body of L1 vertebra (HCC)  Age-related osteoporosis with current pathological fracture, sequela Assessment & Plan: May consider alternative  vs resubmit for evenity  after CT coronary calcium  score    Atherosclerosis of aorta [facility]) Assessment & Plan: Cholesterol medication manages her condition, making Apo B testing unnecessary. A CT scan of the heart is considered to assess coronary artery disease risk and potentially adjust treatment, possibly increasing the aggressiveness of cholesterol medication and dietary recommendations. Order CT scan of the heart to assess for coronary artery disease.   Chronic back pain greater than 3 months duration Assessment & Plan: Acupuncture has significantly improved her condition. She reports new gnawing pain in the left buttocks, possibly related to sciatica or cholesterol medication side effects. The pain does not radiate to the leg or groin and does not worsen with pressure. Continue current pain management regimen.   Mixed stress and urge urinary incontinence Assessment & Plan: Trospium and vaginal estrogen cream manage her condition. Trospium is more effective than previous VESIcare  despite higher cost. Continue trospium and vaginal estrogen cream.   Tremor Assessment & Plan: Propranolol  and primidone  manage her condition. Continued neurologist visits are recommended for specialized management and confirmation of diagnosis. The neurologist can provide further testing to ensure the tremor is benign. Continue propranolol  and primidone . Follow up with neurologist to confirm diagnosis and management plan.   Pruritic rash           ?? Please Don't Use Gasoline on Your Skin -- Even a Small Amount Can Be Harmful We understand you're trying to stop the itch -- but gasoline  is not safe for skin. Even if you're using just a drop (the size of a nickel), gasoline can do more harm than good.  ?? Here's Why Gasoline is Dangerous It can burn your skin Gasoline contains harsh chemicals that can cause painful burns, rashes, or skin damage. It can make your itching worse Gasoline dries out and  irritates your skin, which can make the itching and rash worse over time. It's toxic Some parts of gasoline (like benzene) can soak through the skin and get into your body -- even in small amounts. These chemicals can harm your nerves, liver, or blood. It's a fire risk Gasoline catches fire easily -- just a spark or a cigarette nearby could lead to serious burns. It raises the risk of infection Gasoline damages the skin's natural barrier, making it easier for germs to get in and cause infections.  ? Safer Ways to Treat Itchy Skin Try these safer options instead: Hydrocortisone cream (over-the-counter) - helps calm inflammation and itching Moisturizers - choose fragrance-free lotions or creams (like CeraVe, Eucerin, or Aquaphor) Cool compresses - can soothe the itch without damaging skin Antihistamines - like Benadryl or loratadine  can help reduce itching Avoid scratching - use gloves at night if needed to prevent damage  When to Get Help If the rash is: Spreading Getting worse Causing pain, oozing, or swelling Please talk to your healthcare provider. You deserve safe, effective relief -- and we're here to help.

## 2024-02-28 NOTE — Addendum Note (Signed)
 Addended by: Brigetta Beckstrom G on: 02/28/2024 08:03 PM   Modules accepted: Level of Service

## 2024-02-28 NOTE — Assessment & Plan Note (Signed)
 Trospium and vaginal estrogen cream manage her condition. Trospium is more effective than previous VESIcare  despite higher cost. Continue trospium and vaginal estrogen cream.

## 2024-02-28 NOTE — Assessment & Plan Note (Signed)
 Cholesterol medication manages her condition, making Apo B testing unnecessary. A CT scan of the heart is considered to assess coronary artery disease risk and potentially adjust treatment, possibly increasing the aggressiveness of cholesterol medication and dietary recommendations. Order CT scan of the heart to assess for coronary artery disease.

## 2024-02-29 ENCOUNTER — Encounter: Payer: Self-pay | Admitting: Neurology

## 2024-02-29 ENCOUNTER — Ambulatory Visit: Admitting: Neurology

## 2024-02-29 ENCOUNTER — Ambulatory Visit: Admitting: Internal Medicine

## 2024-02-29 DIAGNOSIS — G250 Essential tremor: Secondary | ICD-10-CM

## 2024-02-29 DIAGNOSIS — R251 Tremor, unspecified: Secondary | ICD-10-CM

## 2024-02-29 MED ORDER — PRIMIDONE 50 MG PO TABS: ORAL_TABLET | ORAL | 3 refills | Status: AC

## 2024-02-29 MED ORDER — PROPRANOLOL HCL 10 MG PO TABS: 10.0000 mg | ORAL_TABLET | Freq: Two times a day (BID) | ORAL | 3 refills | Status: DC

## 2024-03-07 ENCOUNTER — Ambulatory Visit: Admitting: Internal Medicine

## 2024-03-07 ENCOUNTER — Telehealth: Payer: Self-pay

## 2024-03-07 DIAGNOSIS — E1129 Type 2 diabetes mellitus with other diabetic kidney complication: Secondary | ICD-10-CM

## 2024-03-07 MED ORDER — KERENDIA 20 MG PO TABS: 20.0000 mg | ORAL_TABLET | Freq: Every day | ORAL | 3 refills | Status: AC

## 2024-03-07 NOTE — Telephone Encounter (Signed)
 Copied from CRM [id number]. Topic: Clinical - Medication Prior Auth >> Mar 07, 2024 11:26 AM Crispin Dolphin wrote: Reason for CRM: Chelsea called. She is [employer] with [employer]. States Kerendia  was denied last year but Wilmer Hash just started new PA on May 20th - KEY# [id number]. Provider needs to update and resubmit. Phone: [phone]  Thank You  Resent up to provider to send in.

## 2024-03-08 ENCOUNTER — Other Ambulatory Visit (HOSPITAL_COMMUNITY): Payer: Self-pay

## 2024-03-08 ENCOUNTER — Telehealth: Payer: Self-pay

## 2024-03-08 DIAGNOSIS — D2339 Other benign neoplasm of skin of other parts of face: Secondary | ICD-10-CM | POA: Diagnosis not present

## 2024-03-08 DIAGNOSIS — D692 Other nonthrombocytopenic purpura: Secondary | ICD-10-CM | POA: Diagnosis not present

## 2024-03-08 DIAGNOSIS — L821 Other seborrheic keratosis: Secondary | ICD-10-CM | POA: Diagnosis not present

## 2024-03-08 DIAGNOSIS — L578 Other skin changes due to chronic exposure to nonionizing radiation: Secondary | ICD-10-CM | POA: Diagnosis not present

## 2024-03-08 DIAGNOSIS — L538 Other specified erythematous conditions: Secondary | ICD-10-CM | POA: Diagnosis not present

## 2024-03-08 DIAGNOSIS — L820 Inflamed seborrheic keratosis: Secondary | ICD-10-CM | POA: Diagnosis not present

## 2024-03-08 DIAGNOSIS — L814 Other melanin hyperpigmentation: Secondary | ICD-10-CM | POA: Diagnosis not present

## 2024-03-08 DIAGNOSIS — D1801 Hemangioma of skin and subcutaneous tissue: Secondary | ICD-10-CM | POA: Diagnosis not present

## 2024-03-08 NOTE — Telephone Encounter (Signed)
 Pharmacy Patient Advocate Encounter   Received notification from Onbase that prior authorization for KERENDIA  20MG  TABS is required/requested.   Insurance verification completed.   The patient is insured through [facility] ADVANTAGE/RX ADVANCE .   Per test claim: PA required; PA submitted to above mentioned insurance via CoverMyMeds Key/confirmation #/EOC [id number] Status is pending

## 2024-03-09 NOTE — Telephone Encounter (Signed)
 Pharmacy Patient Advocate Encounter  Received notification from [facility] ADVANTAGE/RX ADVANCE that Prior Authorization for Kerendia  20mg  tabs has been DENIED.  Full denial letter will be uploaded to the media tab. See denial reason below.   PA #/Case ID/Reference #:  [id number]

## 2024-03-12 ENCOUNTER — Telehealth: Payer: Self-pay

## 2024-03-12 ENCOUNTER — Other Ambulatory Visit (HOSPITAL_COMMUNITY): Payer: Self-pay

## 2024-03-12 NOTE — Telephone Encounter (Signed)
 Spoke with pt via phone letting her know Kerendia  was denied stated she understood.

## 2024-03-14 ENCOUNTER — Ambulatory Visit: Admitting: Internal Medicine

## 2024-03-15 ENCOUNTER — Ambulatory Visit: Admitting: Internal Medicine

## 2024-03-18 ENCOUNTER — Other Ambulatory Visit: Payer: Self-pay | Admitting: Internal Medicine

## 2024-03-19 ENCOUNTER — Encounter: Payer: Self-pay | Admitting: Internal Medicine

## 2024-03-19 ENCOUNTER — Ambulatory Visit: Admitting: Internal Medicine

## 2024-03-19 VITALS — BP 108/62 | HR 66 | Temp 98.0°F | Ht 64.0 in | Wt 129.6 lb

## 2024-03-19 DIAGNOSIS — G8929 Other chronic pain: Secondary | ICD-10-CM

## 2024-03-19 DIAGNOSIS — R809 Proteinuria, unspecified: Secondary | ICD-10-CM | POA: Diagnosis not present

## 2024-03-19 DIAGNOSIS — E782 Mixed hyperlipidemia: Secondary | ICD-10-CM | POA: Diagnosis not present

## 2024-03-19 DIAGNOSIS — G250 Essential tremor: Secondary | ICD-10-CM

## 2024-03-19 DIAGNOSIS — M549 Dorsalgia, unspecified: Secondary | ICD-10-CM | POA: Diagnosis not present

## 2024-03-19 DIAGNOSIS — Z136 Encounter for screening for cardiovascular disorders: Secondary | ICD-10-CM

## 2024-03-19 DIAGNOSIS — E1129 Type 2 diabetes mellitus with other diabetic kidney complication: Secondary | ICD-10-CM | POA: Diagnosis not present

## 2024-03-19 LAB — CBC WITH DIFFERENTIAL/PLATELET
Basophils Absolute: 0 10*3/uL (ref 0.0–0.1)
Basophils Relative: 0.4 % (ref 0.0–3.0)
Eosinophils Absolute: 0.1 10*3/uL (ref 0.0–0.7)
Eosinophils Relative: 2.2 % (ref 0.0–5.0)
HCT: 37.6 % (ref 36.0–46.0)
Hemoglobin: 12.7 g/dL (ref 12.0–15.0)
Lymphocytes Relative: 51.4 % — ABNORMAL HIGH (ref 12.0–46.0)
Lymphs Abs: 2.1 10*3/uL (ref 0.7–4.0)
MCHC: 33.7 g/dL (ref 30.0–36.0)
MCV: 95.5 fl (ref 78.0–100.0)
Monocytes Absolute: 0.2 10*3/uL (ref 0.1–1.0)
Monocytes Relative: 4.6 % (ref 3.0–12.0)
Neutro Abs: 1.7 10*3/uL (ref 1.4–7.7)
Neutrophils Relative %: 41.4 % — ABNORMAL LOW (ref 43.0–77.0)
Platelets: 174 10*3/uL (ref 150.0–400.0)
RBC: 3.94 Mil/uL (ref 3.87–5.11)
RDW: 14.1 % (ref 11.5–15.5)
WBC: 4 10*3/uL (ref 4.0–10.5)

## 2024-03-19 LAB — MICROALBUMIN / CREATININE URINE RATIO
Creatinine,U: 162.2 mg/dL
Microalb Creat Ratio: 6 mg/g (ref 0.0–30.0)
Microalb, Ur: 1 mg/dL (ref 0.0–1.9)

## 2024-03-19 LAB — LIPID PANEL
Cholesterol: 372 mg/dL — ABNORMAL HIGH (ref 0–200)
HDL: 59.5 mg/dL (ref >39.00)
LDL Cholesterol: 264 mg/dL — ABNORMAL HIGH (ref 0–99)
NonHDL: 312.35
Total CHOL/HDL Ratio: 6
Triglycerides: 241 mg/dL — ABNORMAL HIGH (ref 0.0–149.0)
VLDL: 48.2 mg/dL — ABNORMAL HIGH (ref 0.0–40.0)

## 2024-03-19 LAB — COMPREHENSIVE METABOLIC PANEL WITH GFR
ALT: 23 U/L (ref 0–35)
AST: 21 U/L (ref 0–37)
Albumin: 4.5 g/dL (ref 3.5–5.2)
Alkaline Phosphatase: 56 U/L (ref 39–117)
BUN: 23 mg/dL (ref 6–23)
CO2: 27 meq/L (ref 19–32)
Calcium: 9.9 mg/dL (ref 8.4–10.5)
Chloride: 103 meq/L (ref 96–112)
Creatinine, Ser: 1.03 mg/dL (ref 0.40–1.20)
GFR: 51.44 mL/min — ABNORMAL LOW (ref >60.00)
Glucose, Bld: 76 mg/dL (ref 70–99)
Potassium: 4.3 meq/L (ref 3.5–5.1)
Sodium: 139 meq/L (ref 135–145)
Total Bilirubin: 0.4 mg/dL (ref 0.2–1.2)
Total Protein: 7.5 g/dL (ref 6.0–8.3)

## 2024-03-19 LAB — HEMOGLOBIN A1C: Hgb A1c MFr Bld: 5.1 % (ref 4.6–6.5)

## 2024-03-19 MED ORDER — EZETIMIBE 10 MG PO TABS: 10.0000 mg | ORAL_TABLET | Freq: Every day | ORAL | 3 refills | Status: AC

## 2024-03-19 NOTE — Patient Instructions (Addendum)
 It was a pleasure seeing you today! Your health and satisfaction are our top priorities.  Scherrie Curt, MD  Your Providers PCP: Anthon Kins, MD,  [phone]) Referring Provider: Anthon Kins, MD,  [phone]) Care Team Provider: Lanita Pitman, MD,  [phone]) Care Team Provider: Reggy Capers Care Team Provider: Acie Acosta,  [phone]) Care Team Provider: Ladon Pickler, MD,  [phone]) Care Team Provider: Shirline Dover, DO,  [phone]) Care Team Provider: Ona Bidding, MD Care Team Provider: Genevie Kerns, MD,  [phone]) Care Team Provider: Manya Sells, MD,  [phone]) Care Team Provider: Rolando Cliche, [facility] Care Team Provider: Nicholas Bari, MD,  [phone]) Care Team Provider: Sheryle Donning, MD,  [phone]) Care Team Provider: Fadeyi, Oluwatoyin Alaba, NP,  [phone]) Care Team Provider: Fadeyi, Oluwatoyin Alaba, NP,  [phone])     NEXT STEPS: [x]  Early Intervention: Schedule sooner appointment, call our on-call services, or go to emergency room if there is any significant Increase in pain or discomfort New or worsening symptoms Sudden or severe changes in your health [x]  Flexible Follow-Up: We recommend a No follow-ups on file. for optimal routine care. This allows for progress monitoring and treatment adjustments. [x]  Preventive Care: Schedule your annual preventive care visit! It's typically covered by insurance and helps identify potential health issues early. [x]  Lab & X-ray Appointments: Incomplete tests scheduled today, or call to schedule. X-rays: [facility] (M-F, 8:30am-noon or 1pm-5pm). [x]  Medical Information Release: Sign a release form at front desk to obtain relevant medical information we don't have.  MAKING THE MOST OF OUR FOCUSED 20 MINUTE APPOINTMENTS: [x]   Clearly state your top concerns at the beginning of the visit to focus our discussion [x]    If you anticipate you will need more time, please inform the front desk during scheduling - we can book multiple appointments in the same week. [x]   If you have transportation problems- use our convenient video appointments or ask about transportation support. [x]   We can get down to business faster if you use MyChart to update information before the visit and submit non-urgent questions before your visit. Thank you for taking the time to provide details through MyChart.  Let our nurse know and she can import this information into your encounter documents.  Arrival and Wait Times: [x]   Arriving on time ensures that everyone receives prompt attention. [x]   Early morning (8a) and afternoon (1p) appointments tend to have shortest wait times. [x]   Unfortunately, we cannot delay appointments for late arrivals or hold slots during phone calls.  Getting Answers and Following Up [x]   Simple Questions & Concerns: For quick questions or basic follow-up after your visit, reach us  at [phone] or MyChart messaging. [x]   Complex Concerns: If your concern is more complex, scheduling an appointment might be best. Discuss this with the staff to find the most suitable option. [x]   Lab & Imaging Results: We'll contact you directly if results are abnormal or you don't use MyChart. Most normal results will be on MyChart within 2-3 business days, with a review message from Dr. Boston Byers. Haven't heard back in 2 weeks? Need results sooner? Contact us  at [phone]. [x]   Referrals: Our referral coordinator will manage specialist referrals. The specialist's office should contact you within 2 weeks to schedule an appointment. Call us  if you haven't heard from them after 2 weeks.  Staying Connected [x]   MyChart: Activate your MyChart for the fastest way to access results and message us . See the last  page of this paperwork for instructions on how to activate.  Bring to Your Next Appointment [x]   Medications: Please  bring all your medication bottles to your next appointment to ensure we have an accurate record of your prescriptions. [x]   Health Diaries: If you're monitoring any health conditions at home, keeping a diary of your readings can be very helpful for discussions at your next appointment.  Billing [x]   X-ray & Lab Orders: These are billed by separate companies. Contact the invoicing company directly for questions or concerns. [x]   Visit Charges: Discuss any billing inquiries with our administrative services team.  Your Satisfaction Matters [x]   Share Your Experience: We strive for your satisfaction! If you have any complaints, or preferably compliments, please let Dr. Boston Byers know directly or contact our Practice Administrators, Olinda Bertrand or [employer], by asking at the front desk.   Reviewing Your Records [x]   Review this early draft of your clinical encounter notes below and the final encounter summary tomorrow on MyChart after its been completed.  All orders placed so far are visible here: Screening for heart disease  Diabetes mellitus with proteinuria (HCC) -     CBC with Differential/Platelet -     Comprehensive metabolic panel with GFR -     Lipid panel -     Hemoglobin A1c -     Microalbumin / creatinine urine ratio -     Ezetimibe; Take 1 tablet (10 mg total) by mouth daily.  Dispense: 90 tablet; Refill: 3  Mixed hyperlipidemia Assessment & Plan: ?? ASCVD RISK ASSESSMENT & MANAGEMENT (ICD-10: Z13.6, Z82.49  G-Codes: W0981, [email or web address])  ?? CARDIOVASCULAR RISK ASSESSMENT: The ASCVD Risk score (Arnett DK, et al., 2019) failed to calculate for the following reasons:   The 2019 ASCVD risk score is only valid for ages 61 to 90 ? RISK STRATIFICATION & CLINICAL SIGNIFICANCE: [x]  ? HIGH RISK (>=20%): High-intensity statin + lifestyle + aggressive risk factor modification   She is at high risk for cardiovascular disease due to age, diabetes, and other risk factors. Current cholesterol  management is inadequate, requiring aggressive risk factor modification and lifestyle changes. A CT coronary calcium  score will evaluate cardiovascular risk. Without lifestyle changes, there is a ~20% risk of myocardial infarction or stroke in the next decade, with potential risk reduction of over 50% with interventions. Order a CT coronary calcium  score, initiate a Mediterranean diet, perform weekly home blood pressure monitoring, and add Zetia to the current cholesterol regimen. Reassess cholesterol in 12 weeks. ?? [x]  DOCUMENTED RISK FACTORS REVIEWED  Summary Labs: Review Flowsheet  More data exists      Latest Ref Rng & Units 06/11/2022 12/14/2022 03/14/2023 07/12/2023 03/19/2024  Labs for ITP Cardiac and Pulmonary Rehab  Cholestrol 0 - 200 mg/dL 191  478  295  - 621   LDL (calc) 0 - 99 mg/dL 308  86  96  - 657   HDL-C >39.00 mg/dL 84.69  62.95  28.41  - 59.50   Trlycerides 0.0 - 149.0 mg/dL 32.4  401.0  272.5  - 366.4   Hemoglobin A1c 4.6 - 6.5 % 6.4  6.2  - 5.2  5.1      Total Cholesterol:  Lab Results  Component Value Date   CHOL 372 (H) 03/19/2024   CHOL 177 03/14/2023   CHOL 154 12/14/2022   CHOL 186 06/11/2022   CHOL 171 11/04/2021    LDL-C: Lab Results  Component Value Date   LDLCALC 264 (H) 03/19/2024  LDLCALC 96 03/14/2023   LDLCALC 86 12/14/2022   LDLCALC 103 (H) 06/11/2022   LDLCALC 106 (H) 11/04/2021     VLDL-C: Lab Results  Component Value Date   VLDL 48.2 (H) 03/19/2024   VLDL 37.4 03/14/2023   VLDL 21.0 12/14/2022   VLDL 18.8 06/11/2022   VLDL 19.2 11/04/2021   Triglycerides: Lab Results  Component Value Date   TRIG 241.0 (H) 03/19/2024   TRIG 187.0 (H) 03/14/2023   TRIG 105.0 12/14/2022   TRIG 94.0 06/11/2022   TRIG 96.0 11/04/2021    HDL-C: Lab Results  Component Value Date   HDL 59.50 03/19/2024   HDL 43.20 03/14/2023   HDL 46.20 12/14/2022   HDL 64.30 06/11/2022   HDL 45.60 11/04/2021   CHOLHDL 6 03/19/2024   CHOLHDL 4 03/14/2023   CHOLHDL 3  12/14/2022   CHOLHDL 3 06/11/2022   CHOLHDL 4 11/04/2021   Kidney Disease Risk: Lab Results  Component Value Date/Time   CREATININE 1.03 03/19/2024 08:27 AM   CREATININE 0.77 01/24/2024 08:28 AM   CREATININE 0.83 12/20/2023 08:25 AM   CREATININE 0.92 10/31/2023 09:31 AM   CREATININE 0.92 09/26/2023 02:18 PM   CREATININE 1.00 08/19/2023 09:14 AM   CREATININE 0.97 07/12/2023 08:45 AM   CREATININE 0.9 09/13/2016 08:03 AM   CREATININE 1.0 09/15/2015 10:16 AM   CREATININE 0.9 09/10/2013 09:13 AM   Lab Results  Component Value Date   GFR 51.44 (L) 03/19/2024   GFR 73.01 01/24/2024   GFR 66.77 12/20/2023   GFR 59.06 (L) 10/31/2023   GFR 49.91 (L) 08/05/2023   EGFR 68 (L) 09/13/2016   EGFR 54 (L) 09/15/2015     Advanced Testing No results found for: "HSCRP", "LIPOA"  Patient has No Prior history confirmed coronary artery disease. We asked about: [x]   Prior CT coronary calcium  score - due next week [x]   Prior EKG [x]   Prior cardiology evaluation    Weight & BMI:  BMI Readings from Last 3 Encounters:  03/19/24 22.25 kg/m  02/29/24 22.52 kg/m  02/28/24 23.85 kg/m   Wt Readings from Last 3 Encounters:  03/19/24 129 lb 9.6 oz (58.8 kg)  02/29/24 131 lb 3.2 oz (59.5 kg)  02/28/24 130 lb 6.4 oz (59.1 kg)      Diabetes Risk: Lab Results  Component Value Date   HGBA1C 5.1 03/19/2024   HGBA1C 5.2 07/12/2023   HGBA1C 6.2 12/14/2022    Hypertension Risk: BP Readings from Last 5 Encounters:  03/19/24 108/62  02/29/24 106/70  02/28/24 110/60  02/07/24 110/64  01/24/24 100/68   Current hypertension medications:       Sig   propranolol  (INDERAL ) 10 MG tablet (Taking) Take 1 tablet (10 mg total) by mouth 2 (two) times daily.       Smoking Status: Social History   Tobacco Use  Smoking Status Never   Passive exposure: Past  Smokeless Tobacco Never   Tobacco Use: Low Risk  (03/19/2024)   Patient History    Smoking Tobacco Use: Never    Smokeless Tobacco Use: Never     Passive Exposure: Past   Family Hx CAD: Family History  Problem Relation Age of Onset   Tremor Mother    Pancreatic cancer Mother    Tremor Father    Cancer Father        lung   Breast cancer Sister 56   Tremor Sister    Tremor Sister    Tremor Brother    Healthy Brother  Cardiovascular Medications: Current Outpatient Medications (Cardiovascular):    propranolol  (INDERAL ) 10 MG tablet, Take 1 tablet (10 mg total) by mouth 2 (two) times daily.   rosuvastatin  (CRESTOR ) 40 MG tablet, Take 1 tablet (40 mg total) by mouth daily.    [x]   Reviewed and Asked about Symptoms Below, Check-boxed if positive:  []   Nausea []   Syncope []   Chest Pain []   Chest Discomfort []   Dyspnea on Exertion []   Orthopnea [x]   Fatigue []   Lower Extremity Edema []   Palpitations []   Paroxysmal Nocturnal Dyspnea    ?? RISK-ENHANCING FACTORS ASSESSED:  Primary factors: []  Family hx premature ASCVD (men <55, women <65) []  LDL-C >=160 mg/dL or non-HDL-C >=161 mg/dL [x]  CKD (eGFR 15-59, not on dialysis) []  Metabolic syndrome []  Chronic inflammatory conditions (RA, psoriasis, HIV) Secondary factors: []  [place] ancestry []  Persistently elevated TG >=175 mg/dL []  hs-CRP >=2.0 mg/L []  Lp(a) >=50 mg/dL []  ABI <0.9 [x]  Premature menopause (<40 years) but did take estrogen after  ??? SHARED DECISION-MAKING DOCUMENTATION: Risk communication: [x]  10-year risk explained in patient-friendly terms [x]  Absolute vs relative risk discussed []  Number needed to treat explained Treatment options reviewed: [x]  Lifestyle modifications [x]  Statin therapy benefits/risks [x]  Alternative strategies [x]  Cost considerations [x]  Patient values and preferences explored Time spent today on cardiovascular risk assessment and management:  over 5 minutes (W9604) ?? INDIVIDUALIZED PATIENT DECISION & TREATMENT PLAN & GOALS ([facility]):  A - Aspirin/Antiplatelet: [x]  ASA 81mg  daily []  Not indicated (bleeding risk) []   Contraindicated: _______  B - Blood Pressure: Goal: [x]  <130/80 []  <140/90  Current  Rx: _______  C - Cholesterol: LDL Goal: [x]  <70 []  <100 []  >=50% reduction  Current: @LAST  LAB LDL@  Statin: _______  S - Smoking/Lifestyle: []  Tobacco cessation [x]  Mediterranean diet [x]  154min/week exercise []  Weight loss  ?? MONITORING & FOLLOW-UP PLAN:  Lipid recheck: []  6-8 weeks (new statin) [x]  12 weeks []  Annual []  hs-CRP  []  Lp(a) >=50 mg/dL [x]  CT coronary calcium  scoring   Blood pressure monitoring: [x]  Home monitoring at least weekly []  2-week recheck []  Monthly  Next risk reassessment: [x]  Annual [x]  3 months and after CT coronary calcium  score   Lifestyle counseling: []  Dietitian referral []  Cardiac rehab []  Exercise physiologist  [x]  Mediterranean Diet Rec ?? QUALITY MEASURES DOCUMENTATION:  CMS 438 (Statin Use): [x]  Statin prescribed for ASCVD risk >=7.5%  CMS 347 (Statin Adherence): [x]  Adherence counseling provided  [employer]: [x]  ABCS goals documented above  HEDIS: [x]  Statin therapy documentation for quality reporting  Clinical Guidelines: 2018 AHA/ACC Cholesterol Guidelines  2017 ACC/AHA BP Guidelines  2019 ACC/AHA Primary Prevention Guidelines Risk Calculator: ACC/AHA Pooled Cohort Equations (Arnett DK, et al., 2019) Patient Resources: [email or web address]  [employer]  AHA Life's Essential 8   Orders: -     Ezetimibe; Take 1 tablet (10 mg total) by mouth daily.  Dispense: 90 tablet; Refill: 3  Essential tremor Assessment & Plan: A neurologist confirmed essential tremor and excluded Parkinson's disease. Tremor is adequately controlled with current medications. Topamax  is avoided due to nephrolithiasis risk. Gabapentin  is considered for hot flashes and tremor management.   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 Lumbar support is beneficial- encouraged patient to use lumbar support  pillows

## 2024-03-19 NOTE — Progress Notes (Signed)
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 10/31/2023 4.2  3.5 - 5.1 mEq/L Final   Chloride 10/31/2023 104  96 - 112 mEq/L Final   CO2 10/31/2023 26  19 - 32 mEq/L Final   Glucose, Bld 10/31/2023 69 (L)  70 - 99 mg/dL Final   BUN 03/47/4259 14  6 - 23 mg/dL Final   Creatinine, Ser 10/31/2023 0.92  0.40 - 1.20 mg/dL Final   GFR 56/38/7564 59.06 (L)  >60.00 mL/min Final   Calcium  10/31/2023 9.9  8.4 - 10.5 mg/dL Final   VITD 33/29/5188 35.99  30.00 - 100.00 ng/mL Final  Office Visit on 10/24/2023  Component Date Value Ref Range Status   Color, Urine 10/24/2023 YELLOW  YELLOW Final   APPearance 10/24/2023 TURBID (A)  CLEAR Final   Specific Gravity, Urine 10/24/2023 1.019  1.001 - 1.035 Final   pH 10/24/2023 5.5  5.0 - 8.0 Final  Glucose, UA 10/24/2023 NEGATIVE  NEGATIVE Final   Bilirubin Urine 10/24/2023 NEGATIVE  NEGATIVE Final   Ketones, ur 10/24/2023 NEGATIVE  NEGATIVE Final   Hgb urine dipstick 10/24/2023 TRACE (A)  NEGATIVE Final   Protein, ur 10/24/2023 TRACE  (A)  NEGATIVE Final   Nitrites, Initial 10/24/2023 POSITIVE (A)  NEGATIVE Final   Leukocyte Esterase 10/24/2023 3+ (A)  NEGATIVE Final   WBC, UA 10/24/2023 PACKED (A)  0 - 5 /HPF Final   RBC / HPF 10/24/2023 0-2  0 - 2 /HPF Final   Squamous Epithelial / HPF 10/24/2023 6-10 (A)  < OR = 5 /HPF Final   Bacteria, UA 10/24/2023 MANY (A)  NONE SEEN /HPF Final   Calcium  Oxalate Crystal 10/24/2023 FEW  NONE OR FEW /HPF Final   Hyaline Cast 10/24/2023 NONE SEEN  NONE SEEN /LPF Final   Note 10/24/2023    Final   MICRO NUMBER: 10/24/2023 [id number]   Final   SPECIMEN QUALITY: 10/24/2023 Adequate   Final   Sample Source 10/24/2023 URINE   Final   STATUS: 10/24/2023 FINAL   Final   ISOLATE 1: 10/24/2023 Escherichia coli (A)   Final   REFLEXIVE URINE CULTURE 10/24/2023    Final  Office Visit on 10/17/2023  Component Date Value Ref Range Status   Color, Urine 10/17/2023 YELLOW  YELLOW Final   APPearance 10/17/2023 CLOUDY (A)  CLEAR Final   Specific Gravity, Urine 10/17/2023 1.024  1.001 - 1.035 Final   pH 10/17/2023 5.5  5.0 - 8.0 Final   Glucose, UA 10/17/2023 NEGATIVE  NEGATIVE Final   Bilirubin Urine 10/17/2023 NEGATIVE  NEGATIVE Final   Ketones, ur 10/17/2023 TRACE (A)  NEGATIVE Final   Hgb urine dipstick 10/17/2023 NEGATIVE  NEGATIVE Final   Protein, ur 10/17/2023 TRACE (A)  NEGATIVE Final   Nitrite 10/17/2023 NEGATIVE  NEGATIVE Final   Leukocytes,Ua 10/17/2023 3+ (A)  NEGATIVE Final   WBC, UA 10/17/2023 > OR = 60 (A)  0 - 5 /HPF Final   RBC / HPF 10/17/2023 0-2  0 - 2 /HPF Final   Squamous Epithelial / HPF 10/17/2023 0-5  < OR = 5 /HPF Final   Bacteria, UA 10/17/2023 MODERATE (A)  NONE SEEN /HPF Final   Hyaline Cast 10/17/2023 NONE SEEN  NONE SEEN /LPF Final  Office Visit on 10/06/2023  Component Date Value Ref Range Status   Color, Urine 10/06/2023 YELLOW  Yellow;Lt. Yellow;Straw;Dark Yellow;Amber;Green;Red;Brown Final   APPearance 10/06/2023 CLEAR  Clear;Turbid;Slightly Cloudy;Cloudy  Final   Specific Gravity, Urine 10/06/2023 1.020  1.000 - 1.030 Final   pH 10/06/2023 6.0  5.0 - 8.0 Final   Total Protein, Urine 10/06/2023 TRACE (A)  Negative Final   Urine Glucose 10/06/2023 NEGATIVE  Negative Final   Ketones, ur 10/06/2023 NEGATIVE  Negative Final   Bilirubin Urine 10/06/2023 NEGATIVE  Negative Final   Hgb urine dipstick 10/06/2023 SMALL (A)  Negative Final   Urobilinogen, UA 10/06/2023 0.2  0.0 - 1.0 Final   Leukocytes,Ua 10/06/2023 MODERATE (A)  Negative Final   Nitrite 10/06/2023 NEGATIVE  Negative Final   WBC, UA 10/06/2023 TNTC(>50/hpf) (A)  0-2/hpf Final   RBC / HPF 10/06/2023 0-2/hpf  0-2/hpf Final   Mucus, UA 10/06/2023 Presence of (A)  None Final   Squamous Epithelial / HPF 10/06/2023 Rare(0-4/hpf)  Rare(0-4/hpf) Final   Bacteria, UA 10/06/2023 Few(10-50/hpf) (A)  None Final   MICRO NUMBER: 10/06/2023 [id number]   Final   SPECIMEN QUALITY: 10/06/2023 Adequate   Final   Sample  Source 10/06/2023 URINE   Final   STATUS: 10/06/2023 FINAL   Final   ISOLATE 1: 10/06/2023 Escherichia coli (A)   Final  Lab on 09/26/2023  Component Date Value Ref Range Status   TSH W/REFLEX TO FT4 09/26/2023 3.36  0.40 - 4.50 mIU/L Final   Glucose, Bld 09/26/2023 101 (H)  65 - 99 mg/dL Final   BUN 14/78/2956 16  7 - 25 mg/dL Final   Creat 21/30/8657 0.92  0.60 - 1.00 mg/dL Final   BUN/Creatinine Ratio 09/26/2023 SEE NOTE:  6 - 22 (calc) Final   Sodium 09/26/2023 140  135 - 146 mmol/L Final   Potassium 09/26/2023 4.3  3.5 - 5.3 mmol/L Final   Chloride 09/26/2023 104  98 - 110 mmol/L Final   CO2 09/26/2023 22  20 - 32 mmol/L Final   Calcium  09/26/2023 9.6  8.6 - 10.4 mg/dL Final   Total Protein 84/69/6295 7.1  6.1 - 8.1 g/dL Final   Albumin 28/41/3244 4.1  3.6 - 5.1 g/dL Final   Globulin 10/13/7251 3.0  1.9 - 3.7 g/dL (calc) Final   AG Ratio 09/26/2023 1.4  1.0 - 2.5 (calc) Final   Total Bilirubin 09/26/2023 0.2  0.2 - 1.2 mg/dL Final   Alkaline phosphatase (APISO) 09/26/2023 86   37 - 153 U/L Final   AST 09/26/2023 17  10 - 35 U/L Final   ALT 09/26/2023 15  6 - 29 U/L Final  There may be more visits with results that are not included.  No image results found. No results found.      02/07/2024    8:03 AM 12/26/2023    9:55 AM 12/20/2023    8:04 AM 12/13/2023    8:03 AM  PHQ 2/9 Scores  PHQ - 2 Score 0 0 0 0   Results LABS HbA1c: within target range (October 2024) GFR: improved (April 2025) HDL: 43 mg/dL LDL: 664 mg/dL    Assessment & Plan Screening for heart disease  Diabetes mellitus with proteinuria (HCC) Well-controlled - Will order lab testing to guide management.  Lab Results  Component Value Date   HGBA1C 5.1 03/19/2024   HGBA1C 5.2 07/12/2023   HGBA1C 6.2 12/14/2022   Lab Results  Component Value Date   MICROALBUR 1.0 03/19/2024   MICROALBUR <0.7 03/16/2023   MICROALBUR <0.7 08/17/2022   MICROALBUR 1.0 06/10/2020   MICROALBUR 1.2 09/11/2019   MICROALBUR <0.7 07/19/2018  Insurance refusing to cover Kerendia  despite chronic kidney disease due to no microalbuminuria recently. I checked for samples but we don't have any.  Diabetes is well-controlled, and kidney function is stable. Evaluate blood glucose control and renal status to assess eligibility for Kerendia . Perform a diabetic kidney evaluation and check blood glucose levels.   Mixed hyperlipidemia ?? ASCVD RISK ASSESSMENT & MANAGEMENT (ICD-10: Z13.6, Z82.49  G-Codes: Q0347, [email or web address])  ?? CARDIOVASCULAR RISK ASSESSMENT: The ASCVD Risk score (Arnett DK, et al., 2019) failed to calculate for the following reasons:   The 2019 ASCVD risk score is only valid for ages 24 to 59 ? RISK STRATIFICATION & CLINICAL SIGNIFICANCE: [x]  ? HIGH RISK (>=20%): High-intensity statin + lifestyle + aggressive risk factor modification   She is at high risk for cardiovascular disease due to age, diabetes, and other risk factors. Current cholesterol management is inadequate, requiring aggressive risk factor  modification and lifestyle changes. A CT coronary calcium  score will evaluate cardiovascular risk. Without lifestyle changes, there is a ~20% risk of myocardial infarction or stroke in the next  decade, with potential risk reduction of over 50% with interventions. Order a CT coronary calcium  score, initiate a Mediterranean diet, perform weekly home blood pressure monitoring, and add Zetia to the current cholesterol regimen. Reassess cholesterol in 12 weeks. ?? [x]  DOCUMENTED RISK FACTORS REVIEWED  Summary Labs: Review Flowsheet  More data exists      Latest Ref Rng & Units 06/11/2022 12/14/2022 03/14/2023 07/12/2023 03/19/2024  Labs for ITP Cardiac and Pulmonary Rehab  Cholestrol 0 - 200 mg/dL 295  621  308  - 657   LDL (calc) 0 - 99 mg/dL 846  86  96  - 962   HDL-C >39.00 mg/dL 95.28  41.32  44.01  - 59.50   Trlycerides 0.0 - 149.0 mg/dL 02.7  253.6  644.0  - 347.4   Hemoglobin A1c 4.6 - 6.5 % 6.4  6.2  - 5.2  5.1      Total Cholesterol:  Lab Results  Component Value Date   CHOL 372 (H) 03/19/2024   CHOL 177 03/14/2023   CHOL 154 12/14/2022   CHOL 186 06/11/2022   CHOL 171 11/04/2021    LDL-C: Lab Results  Component Value Date   LDLCALC 264 (H) 03/19/2024   LDLCALC 96 03/14/2023   LDLCALC 86 12/14/2022   LDLCALC 103 (H) 06/11/2022   LDLCALC 106 (H) 11/04/2021     VLDL-C: Lab Results  Component Value Date   VLDL 48.2 (H) 03/19/2024   VLDL 37.4 03/14/2023   VLDL 21.0 12/14/2022   VLDL 18.8 06/11/2022   VLDL 19.2 11/04/2021   Triglycerides: Lab Results  Component Value Date   TRIG 241.0 (H) 03/19/2024   TRIG 187.0 (H) 03/14/2023   TRIG 105.0 12/14/2022   TRIG 94.0 06/11/2022   TRIG 96.0 11/04/2021    HDL-C: Lab Results  Component Value Date   HDL 59.50 03/19/2024   HDL 43.20 03/14/2023   HDL 46.20 12/14/2022   HDL 64.30 06/11/2022   HDL 45.60 11/04/2021   CHOLHDL 6 03/19/2024   CHOLHDL 4 03/14/2023   CHOLHDL 3 12/14/2022   CHOLHDL 3 06/11/2022   CHOLHDL 4 11/04/2021    Kidney Disease Risk: Lab Results  Component Value Date/Time   CREATININE 1.03 03/19/2024 08:27 AM   CREATININE 0.77 01/24/2024 08:28 AM   CREATININE 0.83 12/20/2023 08:25 AM   CREATININE 0.92 10/31/2023 09:31 AM   CREATININE 0.92 09/26/2023 02:18 PM   CREATININE 1.00 08/19/2023 09:14 AM   CREATININE 0.97 07/12/2023 08:45 AM   CREATININE 0.9 09/13/2016 08:03 AM   CREATININE 1.0 09/15/2015 10:16 AM   CREATININE 0.9 09/10/2013 09:13 AM   Lab Results  Component Value Date   GFR 51.44 (L) 03/19/2024   GFR 73.01 01/24/2024   GFR 66.77 12/20/2023   GFR 59.06 (L) 10/31/2023   GFR 49.91 (L) 08/05/2023   EGFR 68 (L) 09/13/2016   EGFR 54 (L) 09/15/2015     Advanced Testing No results found for: "HSCRP", "LIPOA"  Patient has No Prior history confirmed coronary artery disease. We asked about: [x]   Prior CT coronary calcium  score - due next week [x]   Prior EKG [x]   Prior cardiology evaluation    Weight & BMI:  BMI Readings from Last 3 Encounters:  03/19/24 22.25 kg/m  02/29/24 22.52 kg/m  02/28/24 23.85 kg/m   Wt Readings from Last 3 Encounters:  03/19/24 129 lb 9.6 oz (58.8 kg)  02/29/24 131 lb 3.2 oz (59.5 kg)  02/28/24 130 lb 6.4 oz (59.1 kg)      Diabetes  Risk: Lab Results  Component Value Date   HGBA1C 5.1 03/19/2024   HGBA1C 5.2 07/12/2023   HGBA1C 6.2 12/14/2022    Hypertension Risk: BP Readings from Last 5 Encounters:  03/19/24 108/62  02/29/24 106/70  02/28/24 110/60  02/07/24 110/64  01/24/24 100/68   Current hypertension medications:       Sig   propranolol  (INDERAL ) 10 MG tablet (Taking) Take 1 tablet (10 mg total) by mouth 2 (two) times daily.       Smoking Status: Social History   Tobacco Use  Smoking Status Never   Passive exposure: Past  Smokeless Tobacco Never   Tobacco Use: Low Risk  (03/19/2024)   Patient History    Smoking Tobacco Use: Never    Smokeless Tobacco Use: Never    Passive Exposure: Past   Family Hx CAD: Family History   Problem Relation Age of Onset   Tremor Mother    Pancreatic cancer Mother    Tremor Father    Cancer Father        lung   Breast cancer Sister 22   Tremor Sister    Tremor Sister    Tremor Brother    Healthy Brother       Cardiovascular Medications: Current Outpatient Medications (Cardiovascular):    propranolol  (INDERAL ) 10 MG tablet, Take 1 tablet (10 mg total) by mouth 2 (two) times daily.   rosuvastatin  (CRESTOR ) 40 MG tablet, Take 1 tablet (40 mg total) by mouth daily.    [x]   Reviewed and Asked about Symptoms Below, Check-boxed if positive:  []   Nausea []   Syncope []   Chest Pain []   Chest Discomfort []   Dyspnea on Exertion []   Orthopnea [x]   Fatigue []   Lower Extremity Edema []   Palpitations []   Paroxysmal Nocturnal Dyspnea    ?? RISK-ENHANCING FACTORS ASSESSED:  Primary factors: []  Family hx premature ASCVD (men <55, women <65) []  LDL-C >=160 mg/dL or non-HDL-C >=914 mg/dL [x]  CKD (eGFR 15-59, not on dialysis) []  Metabolic syndrome []  Chronic inflammatory conditions (RA, psoriasis, HIV) Secondary factors: []  [place] ancestry []  Persistently elevated TG >=175 mg/dL []  hs-CRP >=2.0 mg/L []  Lp(a) >=50 mg/dL []  ABI <0.9 [x]  Premature menopause (<40 years) but did take estrogen after  ??? SHARED DECISION-MAKING DOCUMENTATION: Risk communication: [x]  10-year risk explained in patient-friendly terms [x]  Absolute vs relative risk discussed []  Number needed to treat explained Treatment options reviewed: [x]  Lifestyle modifications [x]  Statin therapy benefits/risks [x]  Alternative strategies [x]  Cost considerations [x]  Patient values and preferences explored Time spent today on cardiovascular risk assessment and management:  over 5 minutes ([id number]) ?? INDIVIDUALIZED PATIENT DECISION & TREATMENT PLAN & GOALS ([facility]):  A - Aspirin/Antiplatelet: [x]  ASA 81mg  daily []  Not indicated (bleeding risk) []  Contraindicated: _______  B - Blood Pressure: Goal: [x]  <130/80  []  <140/90  Current  Rx: _______  C - Cholesterol: LDL Goal: [x]  <70 []  <100 []  >=50% reduction  Current: @LAST  LAB LDL@  Statin: _______  S - Smoking/Lifestyle: []  Tobacco cessation [x]  Mediterranean diet [x]  14min/week exercise []  Weight loss  ?? MONITORING & FOLLOW-UP PLAN:  Lipid recheck: []  6-8 weeks (new statin) [x]  12 weeks []  Annual []  hs-CRP  []  Lp(a) >=50 mg/dL [x]  CT coronary calcium  scoring   Blood pressure monitoring: [x]  Home monitoring at least weekly []  2-week recheck []  Monthly  Next risk reassessment: [x]  Annual [x]  3 months and after CT coronary calcium  score   Lifestyle counseling: []  Dietitian referral []  Cardiac rehab []  Exercise  physiologist  [x]  Mediterranean Diet Rec ?? QUALITY MEASURES DOCUMENTATION:  CMS 438 (Statin Use): [x]  Statin prescribed for ASCVD risk >=7.5%  CMS 347 (Statin Adherence): [x]  Adherence counseling provided  [employer]: [x]  ABCS goals documented above  HEDIS: [x]  Statin therapy documentation for quality reporting  Clinical Guidelines: 2018 AHA/ACC Cholesterol Guidelines  2017 ACC/AHA BP Guidelines  2019 ACC/AHA Primary Prevention Guidelines Risk Calculator: ACC/AHA Pooled Cohort Equations (Arnett DK, et al., 2019) Patient Resources: [email or web address]  [employer]  AHA Life's Essential 8  Essential tremor A neurologist confirmed essential tremor and excluded Parkinson's disease. Tremor is adequately controlled with current medications. Topamax  is avoided due to nephrolithiasis risk. Gabapentin  is considered for hot flashes and tremor management. 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 Lumbar support is beneficial- encouraged patient to use lumbar support pillows         Orders Placed in Encounter:   Lab Orders         CBC with Differential/Platelet         Comprehensive metabolic panel with GFR         Lipid panel         Hemoglobin A1c         Microalbumin /  creatinine urine ratio     Imaging Orders  No imaging studies ordered today   Referral Orders  No referral(s) requested today   Meds ordered this encounter  Medications   ezetimibe (ZETIA) 10 MG tablet    Sig: Take 1 tablet (10 mg total) by mouth daily.    Dispense:  90 tablet    Refill:  3     This document was synthesized by artificial intelligence ([employer]bridge) using HIPAA-compliant recording of the clinical interaction;   We discussed the use of AI scribe software for clinical note transcription with the patient, who gave verbal consent to proceed. additional Info: This encounter employed state-of-the-art, real-time, collaborative documentation. The patient actively reviewed and assisted in updating their electronic medical record on a shared screen, ensuring transparency and facilitating joint problem-solving for the problem list, overview, and plan. This approach promotes accurate, informed care. The treatment plan was discussed and reviewed in detail, including medication safety, potential side effects, and all patient questions. We confirmed understanding and comfort with the plan. Follow-up instructions were established, including contacting the office for any concerns, returning if symptoms worsen, persist, or new symptoms develop, and precautions for potential emergency department visits.

## 2024-03-19 NOTE — Assessment & Plan Note (Signed)
 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 Lumbar support is beneficial- encouraged patient to use lumbar support pillows

## 2024-03-19 NOTE — Assessment & Plan Note (Addendum)
 ?? ASCVD RISK ASSESSMENT & MANAGEMENT (ICD-10: Z13.6, Z82.49  G-Codes: W1191, [email or web address])  ?? CARDIOVASCULAR RISK ASSESSMENT: The ASCVD Risk score (Arnett DK, et al., 2019) failed to calculate for the following reasons:   The 2019 ASCVD risk score is only valid for ages 38 to 64 ? RISK STRATIFICATION & CLINICAL SIGNIFICANCE: [x]  ? HIGH RISK (>=20%): High-intensity statin + lifestyle + aggressive risk factor modification   She is at high risk for cardiovascular disease due to age, diabetes, and other risk factors. Current cholesterol management is inadequate, requiring aggressive risk factor modification and lifestyle changes. A CT coronary calcium  score will evaluate cardiovascular risk. Without lifestyle changes, there is a ~20% risk of myocardial infarction or stroke in the next decade, with potential risk reduction of over 50% with interventions. Order a CT coronary calcium  score, initiate a Mediterranean diet, perform weekly home blood pressure monitoring, and add Zetia to the current cholesterol regimen. Reassess cholesterol in 12 weeks. ?? [x]  DOCUMENTED RISK FACTORS REVIEWED  Summary Labs: Review Flowsheet  More data exists      Latest Ref Rng & Units 06/11/2022 12/14/2022 03/14/2023 07/12/2023 03/19/2024  Labs for ITP Cardiac and Pulmonary Rehab  Cholestrol 0 - 200 mg/dL 478  295  621  - 308   LDL (calc) 0 - 99 mg/dL 657  86  96  - 846   HDL-C >39.00 mg/dL 96.29  52.84  13.24  - 59.50   Trlycerides 0.0 - 149.0 mg/dL 40.1  027.2  536.6  - 440.3   Hemoglobin A1c 4.6 - 6.5 % 6.4  6.2  - 5.2  5.1      Total Cholesterol:  Lab Results  Component Value Date   CHOL 372 (H) 03/19/2024   CHOL 177 03/14/2023   CHOL 154 12/14/2022   CHOL 186 06/11/2022   CHOL 171 11/04/2021    LDL-C: Lab Results  Component Value Date   LDLCALC 264 (H) 03/19/2024   LDLCALC 96 03/14/2023   LDLCALC 86 12/14/2022   LDLCALC 103 (H) 06/11/2022   LDLCALC 106 (H) 11/04/2021     VLDL-C: Lab Results  Component Value Date    VLDL 48.2 (H) 03/19/2024   VLDL 37.4 03/14/2023   VLDL 21.0 12/14/2022   VLDL 18.8 06/11/2022   VLDL 19.2 11/04/2021   Triglycerides: Lab Results  Component Value Date   TRIG 241.0 (H) 03/19/2024   TRIG 187.0 (H) 03/14/2023   TRIG 105.0 12/14/2022   TRIG 94.0 06/11/2022   TRIG 96.0 11/04/2021    HDL-C: Lab Results  Component Value Date   HDL 59.50 03/19/2024   HDL 43.20 03/14/2023   HDL 46.20 12/14/2022   HDL 64.30 06/11/2022   HDL 45.60 11/04/2021   CHOLHDL 6 03/19/2024   CHOLHDL 4 03/14/2023   CHOLHDL 3 12/14/2022   CHOLHDL 3 06/11/2022   CHOLHDL 4 11/04/2021   Kidney Disease Risk: Lab Results  Component Value Date/Time   CREATININE 1.03 03/19/2024 08:27 AM   CREATININE 0.77 01/24/2024 08:28 AM   CREATININE 0.83 12/20/2023 08:25 AM   CREATININE 0.92 10/31/2023 09:31 AM   CREATININE 0.92 09/26/2023 02:18 PM   CREATININE 1.00 08/19/2023 09:14 AM   CREATININE 0.97 07/12/2023 08:45 AM   CREATININE 0.9 09/13/2016 08:03 AM   CREATININE 1.0 09/15/2015 10:16 AM   CREATININE 0.9 09/10/2013 09:13 AM   Lab Results  Component Value Date   GFR 51.44 (L) 03/19/2024   GFR 73.01 01/24/2024   GFR 66.77 12/20/2023   GFR 59.06 (L) 10/31/2023  GFR 49.91 (L) 08/05/2023   EGFR 68 (L) 09/13/2016   EGFR 54 (L) 09/15/2015     Advanced Testing No results found for: "HSCRP", "LIPOA"  Patient has No Prior history confirmed coronary artery disease. We asked about: [x]   Prior CT coronary calcium  score - due next week [x]   Prior EKG [x]   Prior cardiology evaluation    Weight & BMI:  BMI Readings from Last 3 Encounters:  03/19/24 22.25 kg/m  02/29/24 22.52 kg/m  02/28/24 23.85 kg/m   Wt Readings from Last 3 Encounters:  03/19/24 129 lb 9.6 oz (58.8 kg)  02/29/24 131 lb 3.2 oz (59.5 kg)  02/28/24 130 lb 6.4 oz (59.1 kg)      Diabetes Risk: Lab Results  Component Value Date   HGBA1C 5.1 03/19/2024   HGBA1C 5.2 07/12/2023   HGBA1C 6.2 12/14/2022    Hypertension Risk:  BP Readings from Last 5 Encounters:  03/19/24 108/62  02/29/24 106/70  02/28/24 110/60  02/07/24 110/64  01/24/24 100/68   Current hypertension medications:       Sig   propranolol  (INDERAL ) 10 MG tablet (Taking) Take 1 tablet (10 mg total) by mouth 2 (two) times daily.       Smoking Status: Social History   Tobacco Use  Smoking Status Never   Passive exposure: Past  Smokeless Tobacco Never   Tobacco Use: Low Risk  (03/19/2024)   Patient History    Smoking Tobacco Use: Never    Smokeless Tobacco Use: Never    Passive Exposure: Past   Family Hx CAD: Family History  Problem Relation Age of Onset   Tremor Mother    Pancreatic cancer Mother    Tremor Father    Cancer Father        lung   Breast cancer Sister 56   Tremor Sister    Tremor Sister    Tremor Brother    Healthy Brother       Cardiovascular Medications: Current Outpatient Medications (Cardiovascular):    propranolol  (INDERAL ) 10 MG tablet, Take 1 tablet (10 mg total) by mouth 2 (two) times daily.   rosuvastatin  (CRESTOR ) 40 MG tablet, Take 1 tablet (40 mg total) by mouth daily.    [x]   Reviewed and Asked about Symptoms Below, Check-boxed if positive:  []   Nausea []   Syncope []   Chest Pain []   Chest Discomfort []   Dyspnea on Exertion []   Orthopnea [x]   Fatigue []   Lower Extremity Edema []   Palpitations []   Paroxysmal Nocturnal Dyspnea    ?? RISK-ENHANCING FACTORS ASSESSED:  Primary factors: []  Family hx premature ASCVD (men <55, women <65) []  LDL-C >=160 mg/dL or non-HDL-C >=295 mg/dL [x]  CKD (eGFR 15-59, not on dialysis) []  Metabolic syndrome []  Chronic inflammatory conditions (RA, psoriasis, HIV) Secondary factors: []  [place] ancestry []  Persistently elevated TG >=175 mg/dL []  hs-CRP >=2.0 mg/L []  Lp(a) >=50 mg/dL []  ABI <0.9 [x]  Premature menopause (<40 years) but did take estrogen after  ??? SHARED DECISION-MAKING DOCUMENTATION: Risk communication: [x]  10-year risk explained in  patient-friendly terms [x]  Absolute vs relative risk discussed []  Number needed to treat explained Treatment options reviewed: [x]  Lifestyle modifications [x]  Statin therapy benefits/risks [x]  Alternative strategies [x]  Cost considerations [x]  Patient values and preferences explored Time spent today on cardiovascular risk assessment and management:  over 5 minutes ([id number]) ?? INDIVIDUALIZED PATIENT DECISION & TREATMENT PLAN & GOALS ([facility]):  A - Aspirin/Antiplatelet: [x]  ASA 81mg  daily []  Not indicated (bleeding risk) []  Contraindicated: _______  B -  Blood Pressure: Goal: [x]  <130/80 []  <140/90  Current  Rx: _______  C - Cholesterol: LDL Goal: [x]  <70 []  <100 []  >=50% reduction  Current: @LAST  LAB LDL@  Statin: _______  S - Smoking/Lifestyle: []  Tobacco cessation [x]  Mediterranean diet [x]  159min/week exercise []  Weight loss  ?? MONITORING & FOLLOW-UP PLAN:  Lipid recheck: []  6-8 weeks (new statin) [x]  12 weeks []  Annual []  hs-CRP  []  Lp(a) >=50 mg/dL [x]  CT coronary calcium  scoring   Blood pressure monitoring: [x]  Home monitoring at least weekly []  2-week recheck []  Monthly  Next risk reassessment: [x]  Annual [x]  3 months and after CT coronary calcium  score   Lifestyle counseling: []  Dietitian referral []  Cardiac rehab []  Exercise physiologist  [x]  Mediterranean Diet Rec ?? QUALITY MEASURES DOCUMENTATION:  CMS 438 (Statin Use): [x]  Statin prescribed for ASCVD risk >=7.5%  CMS 347 (Statin Adherence): [x]  Adherence counseling provided  [employer]: [x]  ABCS goals documented above  HEDIS: [x]  Statin therapy documentation for quality reporting  Clinical Guidelines: 2018 AHA/ACC Cholesterol Guidelines  2017 ACC/AHA BP Guidelines  2019 ACC/AHA Primary Prevention Guidelines Risk Calculator: ACC/AHA Pooled Cohort Equations (Arnett DK, et al., 2019) Patient Resources: [email or web address]  [employer]  AHA Life's Essential 8

## 2024-03-19 NOTE — Assessment & Plan Note (Signed)
 A neurologist confirmed essential tremor and excluded Parkinson's disease. Tremor is adequately controlled with current medications. Topamax  is avoided due to nephrolithiasis risk. Gabapentin  is considered for hot flashes and tremor management.

## 2024-03-21 ENCOUNTER — Ambulatory Visit: Payer: Self-pay | Admitting: Internal Medicine

## 2024-03-21 ENCOUNTER — Ambulatory Visit (HOSPITAL_BASED_OUTPATIENT_CLINIC_OR_DEPARTMENT_OTHER)
Admission: RE | Admit: 2024-03-21 | Discharge: 2024-03-21 | Disposition: A | Discharge status: Home / Self Care | Payer: Self-pay | Source: Ambulatory Visit | Attending: Internal Medicine | Admitting: Internal Medicine

## 2024-03-21 DIAGNOSIS — E1129 Type 2 diabetes mellitus with other diabetic kidney complication: Secondary | ICD-10-CM | POA: Insufficient documentation

## 2024-03-21 DIAGNOSIS — R809 Proteinuria, unspecified: Secondary | ICD-10-CM | POA: Insufficient documentation

## 2024-03-21 NOTE — Progress Notes (Signed)
 No protein in urine so no acitve kidney damage and sugar is good- but GFR (kidney filtration rate) is lowered from 73->51.   We should keep watching kidney function closely maybe recheck in next few weeks. ? If urinary tract infection (UTI) returned we should get more urine.  Also cholesterol went way way high... so much I think there must be lab error and it needs rechecked.    Confirm patient taking rosuvastatin .  Make sure we have appointment in 2-4 weeks to go over it all in person.

## 2024-03-22 ENCOUNTER — Ambulatory Visit: Payer: Self-pay | Admitting: Internal Medicine

## 2024-03-22 DIAGNOSIS — I2510 Atherosclerotic heart disease of native coronary artery without angina pectoris: Secondary | ICD-10-CM | POA: Insufficient documentation

## 2024-03-23 ENCOUNTER — Ambulatory Visit: Payer: Self-pay

## 2024-03-23 ENCOUNTER — Ambulatory Visit
Admission: EM | Admit: 2024-03-23 | Discharge: 2024-03-23 | Disposition: A | Discharge status: Home / Self Care | Attending: Family Medicine | Admitting: Family Medicine

## 2024-03-23 ENCOUNTER — Encounter: Payer: Self-pay | Admitting: Emergency Medicine

## 2024-03-23 DIAGNOSIS — S32010D Wedge compression fracture of first lumbar vertebra, subsequent encounter for fracture with routine healing: Secondary | ICD-10-CM | POA: Diagnosis not present

## 2024-03-23 DIAGNOSIS — L03114 Cellulitis of left upper limb: Secondary | ICD-10-CM | POA: Diagnosis not present

## 2024-03-23 DIAGNOSIS — M4856XD Collapsed vertebra, not elsewhere classified, lumbar region, subsequent encounter for fracture with routine healing: Secondary | ICD-10-CM | POA: Diagnosis not present

## 2024-03-23 MED ORDER — DOXYCYCLINE HYCLATE 100 MG PO CAPS: 100.0000 mg | ORAL_CAPSULE | Freq: Two times a day (BID) | ORAL | 0 refills | Status: AC

## 2024-03-23 NOTE — Telephone Encounter (Signed)
 FYI Only or Action Required?: FYI only for provider  Patient was last seen in primary care on 03/19/2024 by Anthon Kins, MD. Called Nurse Triage reporting Insect Bite. Symptoms began several days ago. Interventions attempted: Prescription medications: mupirocin, triamcinalone. Symptoms are: gradually worsening.  Triage Disposition: See Physician Within 24 Hours Patient to UC, no office visit appointment available  Patient/caregiver understands and will follow disposition?: Yes  Copied from CRM [id number]. Topic: Clinical - Red Word Triage >> Mar 23, 2024 10:16 AM Kevelyn M wrote: Red Word that prompted transfer to Nurse Triage: Bite on left hand swollen and itcy and hot to touch. 2-3 days and it's getting worse Reason for Disposition  [1] Red or very tender (to touch) area AND [2] started over 24 hours after the bite  Answer Assessment - Initial Assessment Questions 1. TYPE of INSECT: What type of insect was it?      unknown 2. ONSET: When did you get bitten?      2-3 days ago 3. LOCATION: Where is the insect bite located?      Left hand 4. REDNESS: Is the area red or pink? If Yes, ask: What size is area of redness? (inches or cm). When did the redness start?     Red and hot covering the entire top of her hand 5. PAIN: Is there any pain? If Yes, ask: How bad is it?  (Scale 1-10; or mild, moderate, severe)     10/10 pain 6. ITCHING: Does it itch? If Yes, ask: How bad is the itch?    - MILD: doesn't interfere with normal activities   - MODERATE-SEVERE: interferes with work, school, sleep, or other activities      Severe itching 7. SWELLING: How big is the swelling? (inches, cm, or compare to coins)     puffy 8. OTHER SYMPTOMS: Do you have any other symptoms?  (e.g., difficulty breathing, hives)     All symptoms have gotten progressively worse   Patient has been applying triamcinolone  cream and mupirocin, but area has only worsened  Protocols used: Insect  Bite-A-AH
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
## 2024-03-23 NOTE — ED Triage Notes (Signed)
 Patient states that she was bitten by something on her left hand 3-4 days ago.  The hand is somewhat swollen and red.  Patient has been applying Triamcinolone  Acetonide Ointment and Mupirocin Ointment to the area w/o relief.

## 2024-03-23 NOTE — Discharge Instructions (Addendum)
 Advised patient to take medication as directed with food to completion.  Encouraged to increase daily water intake to 64 ounces per day while taking this medication.  Advised if symptoms worsen and/or unresolved please follow-up with your PCP or here for further evaluation.

## 2024-03-26 ENCOUNTER — Ambulatory Visit (INDEPENDENT_AMBULATORY_CARE_PROVIDER_SITE_OTHER): Admitting: Internal Medicine

## 2024-03-26 ENCOUNTER — Encounter: Payer: Self-pay | Admitting: Internal Medicine

## 2024-03-26 ENCOUNTER — Other Ambulatory Visit (HOSPITAL_BASED_OUTPATIENT_CLINIC_OR_DEPARTMENT_OTHER): Payer: Self-pay

## 2024-03-26 VITALS — BP 100/60 | HR 62 | Temp 98.0°F | Ht 62.0 in | Wt 129.0 lb

## 2024-03-26 DIAGNOSIS — I2510 Atherosclerotic heart disease of native coronary artery without angina pectoris: Secondary | ICD-10-CM | POA: Diagnosis not present

## 2024-03-26 DIAGNOSIS — L03119 Cellulitis of unspecified part of limb: Secondary | ICD-10-CM

## 2024-03-26 DIAGNOSIS — M5442 Lumbago with sciatica, left side: Secondary | ICD-10-CM | POA: Diagnosis not present

## 2024-03-26 DIAGNOSIS — E7800 Pure hypercholesterolemia, unspecified: Secondary | ICD-10-CM

## 2024-03-26 DIAGNOSIS — I2584 Coronary atherosclerosis due to calcified coronary lesion: Secondary | ICD-10-CM

## 2024-03-26 DIAGNOSIS — M5441 Lumbago with sciatica, right side: Secondary | ICD-10-CM | POA: Diagnosis not present

## 2024-03-26 DIAGNOSIS — G8929 Other chronic pain: Secondary | ICD-10-CM | POA: Diagnosis not present

## 2024-03-26 MED ORDER — OMEGA-3-ACID ETHYL ESTERS 1 G PO CAPS
2.0000 g | ORAL_CAPSULE | Freq: Two times a day (BID) | ORAL | 3 refills | Status: AC
  Filled 2024-03-26: qty 360, 90d supply, fill #0

## 2024-03-26 MED ORDER — REPATHA SURECLICK 140 MG/ML ~~LOC~~ SOAJ
140.0000 mg | SUBCUTANEOUS | 2 refills | Status: DC
  Filled 2024-03-26: qty 2, 28d supply, fill #0
  Filled 2024-04-22: qty 2, 28d supply, fill #1
  Filled 2024-05-28: qty 2, 28d supply, fill #2

## 2024-03-26 NOTE — Progress Notes (Unsigned)
 The yeah never seen you this lady I think this is past her bedtime what is going on the start the document prescribed okay alright so okay and scared right patient and doing the right stent so urgent care gave you good medicine that is okay 1 I think okay as they did a 7-day supply is starting June 13 so they choose a different antibiotic that is a good choice so I am sorry I did not make an appointment just because my dentist said it is not improving so it was yeah so it is improved and yeah I right with with cellulitis with a bacterial infection of the left hand and urgently to the right thank you negative is you quicker than what I do get I I do not monitor my MyChart about it when I saw him so my that is having a bad infection of the hand and and if there was a delay could be early enough to be serious but it looks too bad now I think it is responding to the doxycycline  that he we every 4 days that should be at this point perfect very strong antibiotic ointment so I think hide I will With centimeter and a different antibiotic but yeah you anyone actually just stick with 1 day no is working is improving well I do not think swelling exam at this thank it still a big is being read but it does not look like the swelling is spread out anymore so it is not spreading infection only used to get inflammation every gravity and then it something probably spironolactone 50 this is a common spider bite location and weekly we will have to get you there hide in the in your sleeves of your clothing and then you put your hands into the clothing and raised the spiders getting crushed inside your sleeve device that is why I see positive vitals are here another time when it happens is reached into some guarding she has okay I am thinking ultrasound in the annual get scared there to so reaching in the dark areas as they come about the this is like classic spider bite related despite his doing I do not come after him is a try to buy  him later the day when it right but if they feel they are being intact they that is their last visit I got a question spray almost sound is only thing that works everything of lots the is not perfect but coordinating best combination of decent and you were out of the [facility] yeah they are worse on the big send handed that he like getting worked so Optometrist and that it does not matter with Mirna Amis so but based on I can more remove or may with medical this reason but you know that it is good usually arms so you did order protect your arms you get so you set up an yet had symptoms this is a pleasant lower I saw that he saw the nerve the spinal surgeon he released you that he said there is a good.fdate of the surgeries I have been using he states the x-rays that he had to look insurance 3 times I may not be inside the  Medications: not yet discussed Lab Results  Component Value Date   HDL 59.50 03/19/2024   HDL 43.20 03/14/2023   HDL 46.20 12/14/2022   CHOLHDL 6 03/19/2024   CHOLHDL 4 03/14/2023   CHOLHDL 3 12/14/2022   Lab Results  Component Value Date   LDLCALC 264 (H) 03/19/2024   LDLCALC 96 03/14/2023   LDLCALC 86 12/14/2022   LDLDIRECT 132.4 07/17/2013   LDLDIRECT 93.8 06/28/2012   Lab Results  Component Value Date   TRIG 241.0 (H) 03/19/2024   TRIG 187.0 (H) 03/14/2023   TRIG 105.0 12/14/2022   Lab Results  Component Value Date   CHOL 372 (H) 03/19/2024   CHOL 177 03/14/2023   CHOL 154 12/14/2022   The ASCVD Risk score (Arnett DK, et al., 2019) failed to calculate for the following reasons:   The 2019 ASCVD risk score is only valid for ages 57 to 38 Lab Results  Component Value Date   ALT 23 03/19/2024   AST 21 03/19/2024   ALKPHOS 56 03/19/2024   TSH 1.430 10/31/2023   HGBA1C 5.1 03/19/2024   Wt Readings from Last 10 Encounters:  03/26/24 129 lb (58.5 kg)  03/23/24 128 lb 11.2 oz (58.4 kg)  03/19/24 129 lb 9.6 oz (58.8 kg)  02/29/24 131 lb 3.2 oz (59.5 kg)   02/28/24 130 lb 6.4 oz (59.1 kg)  02/07/24 126 lb (57.2 kg)  01/24/24 127 lb 6.4 oz (57.8 kg)  01/09/24 124 lb 3.2 oz (56.3 kg)  01/02/24 128 lb 9.6 oz (58.3 kg)  12/26/23 129 lb 6.4 oz (58.7 kg)        Body mass index is 23.59 kg/m.  Lipoprotein(a), Apolipoprotein B (ApoB), and High-sensitivity C-reactive protein (hs-CRP) No results found for: HSCRP, LIPOA Improving Your Cholesterol: Diet: Focus on a Mediterranean-style diet, limit saturated fats and sugars, and increase omega-3 fatty acids (fish, flaxseeds,nuts,extra virgin olive oil, avocados). Exercise: Engage in regular physical activity (aerobic exercises are particularly beneficial for HDL). Weight Management: Maintain a healthy weight. Smoking Cessation: Quitting smoking improves cholesterol levels.

## 2024-03-26 NOTE — Patient Instructions (Addendum)
  VISIT SUMMARY:  Today, we discussed your hand infection and chronic back pain. We reviewed your current treatments and made some adjustments to help manage your symptoms more effectively.  YOUR PLAN:  -CELLULITIS OF THE HAND: Cellulitis is a bacterial infection of the skin and tissues beneath it. Your hand infection followed a suspected spider bite. Continue taking doxycycline  for the next 3-4 days and keep applying mupirocin ointment to the affected area. The swelling has decreased, but redness and inflammation remain.  -CHRONIC BACK PAIN DUE TO COMPRESSION FRACTURE: Chronic back pain from a compression fracture can be due to nerve pinching. You've tried various treatments with limited relief. We discussed the possibility of steroid injections and dry needling as new options. Contact Dr. Hartley Line office to discuss steroid injections, and refer to [facility] for dry needling therapy.  -CORONARY ARTERY DISEASE: Coronary artery disease involves the buildup of plaque in the arteries. Your heart scan shows some plaque, but your condition is relatively clear for your age. Continue taking Zetia , high-dose rosuvastatin , and daily baby aspirin (81 mg) to manage your cholesterol and prevent clot formation.  INSTRUCTIONS:  Please continue your current medications and treatments as discussed. Contact Dr. Hartley Line office to discuss steroid injections for your back pain and refer to [facility] for dry needling therapy. Follow up with us  if you have any new symptoms or concerns.   [facility] in [place], [place] Here are some clinics in [place] the problems of the this referral that I am putting in [facility] [place], [place] Specializes in orthopedic rehabilitation, offering dry needling to treat chronic pain conditions.  [facility] [place], [place] Offers personalized physical therapy plans, incorporating dry needling for effective pain relief.   [facility] [place], [place] Provides chiropractic and physical therapy services, including dry needling, for back pain management.  These clinics have experienced practitioners who can assess your patient's condition and determine if dry needling is an appropriate treatment option.  The

## 2024-03-27 ENCOUNTER — Ambulatory Visit: Admitting: Internal Medicine

## 2024-03-27 ENCOUNTER — Other Ambulatory Visit (HOSPITAL_BASED_OUTPATIENT_CLINIC_OR_DEPARTMENT_OTHER): Payer: Self-pay

## 2024-03-27 LAB — LDL CHOLESTEROL, DIRECT: Direct LDL: 121 mg/dL

## 2024-03-27 NOTE — Assessment & Plan Note (Signed)
 A heart scan shows a calcium  score of 19 in the right coronary artery, placing her in the 40th percentile for her age, indicating some plaque presence but relatively clear for an 78 year old. She is on Zetia  and high-dose rosuvastatin , which are appropriate for her condition. Continue these medications and daily baby aspirin (81 mg) for life to prevent clot formation.

## 2024-03-28 ENCOUNTER — Ambulatory Visit: Payer: Self-pay | Admitting: Internal Medicine

## 2024-03-28 ENCOUNTER — Other Ambulatory Visit (HOSPITAL_BASED_OUTPATIENT_CLINIC_OR_DEPARTMENT_OTHER): Payer: Self-pay

## 2024-03-28 ENCOUNTER — Telehealth: Payer: Self-pay

## 2024-03-28 ENCOUNTER — Other Ambulatory Visit (HOSPITAL_COMMUNITY): Payer: Self-pay

## 2024-03-28 NOTE — Progress Notes (Signed)
 Send to prior authorization- Low Density Lipoprotein (LDL cholesterol) too high despite Zetia  and rosuvastatin  in coronary artery disease- need to get Repatha approved.

## 2024-03-28 NOTE — Telephone Encounter (Signed)
 Pharmacy Patient Advocate Encounter  Received notification from [facility] [employer]/RX ADVANCE that Prior Authorization for Repatha SureClick 140MG /ML auto-injectors has been APPROVED from 03/28/24 to 09/24/24   PA #/Case ID/Reference #: [id number]

## 2024-03-28 NOTE — Telephone Encounter (Signed)
 Pharmacy Patient Advocate Encounter   Received notification from [employer]base that prior authorization for Repatha SureClick 140MG /ML auto-injectors is required/requested.   Insurance verification completed.   The patient is insured through [facility] ADVANTAGE/RX ADVANCE .   Per test claim: PA required; PA submitted to above mentioned insurance via CoverMyMeds Key/confirmation #/EOC [id number] Status is pending

## 2024-03-29 ENCOUNTER — Other Ambulatory Visit (HOSPITAL_BASED_OUTPATIENT_CLINIC_OR_DEPARTMENT_OTHER): Payer: Self-pay

## 2024-03-30 ENCOUNTER — Other Ambulatory Visit (HOSPITAL_BASED_OUTPATIENT_CLINIC_OR_DEPARTMENT_OTHER): Payer: Self-pay

## 2024-03-30 ENCOUNTER — Ambulatory Visit: Admitting: Internal Medicine

## 2024-04-03 DIAGNOSIS — L820 Inflamed seborrheic keratosis: Secondary | ICD-10-CM | POA: Diagnosis not present

## 2024-04-03 DIAGNOSIS — L2989 Other pruritus: Secondary | ICD-10-CM | POA: Diagnosis not present

## 2024-04-03 DIAGNOSIS — L538 Other specified erythematous conditions: Secondary | ICD-10-CM | POA: Diagnosis not present

## 2024-04-03 DIAGNOSIS — L578 Other skin changes due to chronic exposure to nonionizing radiation: Secondary | ICD-10-CM | POA: Diagnosis not present

## 2024-04-03 DIAGNOSIS — L821 Other seborrheic keratosis: Secondary | ICD-10-CM | POA: Diagnosis not present

## 2024-04-04 ENCOUNTER — Ambulatory Visit: Admitting: Neurology

## 2024-04-06 ENCOUNTER — Ambulatory Visit: Admitting: Internal Medicine

## 2024-04-12 DIAGNOSIS — N390 Urinary tract infection, site not specified: Secondary | ICD-10-CM | POA: Diagnosis not present

## 2024-04-17 ENCOUNTER — Encounter: Payer: Self-pay | Admitting: Internal Medicine

## 2024-04-17 ENCOUNTER — Other Ambulatory Visit (HOSPITAL_BASED_OUTPATIENT_CLINIC_OR_DEPARTMENT_OTHER): Payer: Self-pay

## 2024-04-17 ENCOUNTER — Ambulatory Visit (INDEPENDENT_AMBULATORY_CARE_PROVIDER_SITE_OTHER): Admitting: Internal Medicine

## 2024-04-17 VITALS — BP 112/62 | HR 62 | Temp 98.0°F | Ht 62.0 in | Wt 127.2 lb

## 2024-04-17 DIAGNOSIS — G8929 Other chronic pain: Secondary | ICD-10-CM | POA: Diagnosis not present

## 2024-04-17 DIAGNOSIS — M549 Dorsalgia, unspecified: Secondary | ICD-10-CM

## 2024-04-17 DIAGNOSIS — R5382 Chronic fatigue, unspecified: Secondary | ICD-10-CM

## 2024-04-17 DIAGNOSIS — Z79899 Other long term (current) drug therapy: Secondary | ICD-10-CM | POA: Insufficient documentation

## 2024-04-17 DIAGNOSIS — E782 Mixed hyperlipidemia: Secondary | ICD-10-CM

## 2024-04-17 DIAGNOSIS — R251 Tremor, unspecified: Secondary | ICD-10-CM

## 2024-04-17 DIAGNOSIS — E039 Hypothyroidism, unspecified: Secondary | ICD-10-CM

## 2024-04-17 LAB — C-REACTIVE PROTEIN: CRP: <1 mg/dL (ref 0.5–20.0)

## 2024-04-17 LAB — SEDIMENTATION RATE: Sed Rate: 1 mm/h (ref 0–30)

## 2024-04-17 LAB — FERRITIN: Ferritin: 27.3 ng/mL (ref 10.0–291.0)

## 2024-04-17 LAB — VITAMIN D 25 HYDROXY (VIT D DEFICIENCY, FRACTURES): VITD: 43.74 ng/mL (ref 30.00–100.00)

## 2024-04-17 LAB — CORTISOL: Cortisol, Plasma: 8.5 ug/dL

## 2024-04-17 MED ORDER — PROPRANOLOL HCL 10 MG PO TABS
10.0000 mg | ORAL_TABLET | Freq: Two times a day (BID) | ORAL | 3 refills | Status: DC
  Filled 2024-04-17: qty 180, 90d supply, fill #0
  Filled 2024-04-22 – 2024-07-09 (×3): qty 180, 90d supply, fill #1
  Filled 2024-07-31: qty 180, 90d supply, fill #2

## 2024-04-17 NOTE — Assessment & Plan Note (Signed)
 The complex medication regimen is actively reviewed for interactions and therapeutic effectiveness. Advise administration of the RSV vaccine given her age and associated high risk; ensure routine re?assessment of bone health and recommended cancer screenings per guidelines. Overall, conditions requiring active management (chronic fatigue workup and suboptimal hyperlipidemia) have prompted diagnostic and therapeutic interventions. Plan includes re-evaluation at the next appointment on July 22 with lab review and clinical reassessment.

## 2024-04-17 NOTE — Assessment & Plan Note (Signed)
 Chronic low back pain at the prior L1 fracture site persists despite current pain management (gabapentin  and intermittent tizanidine ). Previous dry needling attempts were unsuccessful due to follow?up issues. A new ambulatory referral for physical therapy emphasizing dry needling is placed. Continue current analgesics with ongoing assessment of functional status.

## 2024-04-17 NOTE — Assessment & Plan Note (Signed)
 Tremor is stable and adequately controlled on propranolol  10?mg by mouth twice daily, despite its potential contribution to fatigue. Continue propranolol  with monitoring for therapeutic efficacy and adverse effects.

## 2024-04-17 NOTE — Assessment & Plan Note (Signed)
 Chronic Fatigue: Persistent fatigue lasting >12 months is not at goal despite normal thyroid  function (TSH 1.43) and corrected iron  deficiency. Diagnostic workup is initiated with orders for TSH/free T4, vitamin D  (25-OH), ferritin, ESR, CRP, ANA, celiac disease panel with reflexes, and morning cortisol to evaluate for autoimmune or inflammatory causes. Continue vitamin D  supplementation with planned recheck of iron  indices. Follow up in 1 week pending results.

## 2024-04-17 NOTE — Assessment & Plan Note (Signed)
 LDL remains elevated at 121?mg/dL despite high?intensity therapy (rosuvastatin  40?mg, ezetimibe  10?mg) and recent initiation of Repatha  (administered 07/05) to drive LDL <59?fh/iO. Active prescription drug management is in place. Continue current lipid regimen with planned cholesterol recheck in 3 months.

## 2024-04-17 NOTE — Patient Instructions (Addendum)
 Chronic Fatigue -- Assessment, Differential, and Workup Summary This is an 78 year old female with multiple comorbidities (CKD, DM2, hypothyroidism, osteoporosis, CAD, OSA, polypharmacy) who complains of chronic fatigue for 1 year. She denies drowsiness. Fatigue has improved from previous severe episode but persists. Key Clinical Data No evidence of anemia: Hgb 12.7-12.9 g/dL, normal indices Normal TSH: 1.43 (10/2023), 2.57 (12/2023), 2.79 (01/2024) Normal B12, folate Normal inflammatory markers (ESR 3) Normal calcium , phosphorus, PTH Stable CKD (GFR 51-73) No active infection (UTI resolved per patient and labs) No evidence of depression (PHQ-2 repeatedly 0) No significant weight loss (127-133 lbs), BMI stable OSA history, but no current CPAP use; sleep fragmentation Polypharmacy (14+ meds) with recent changes: Effexor  ?, levothyroxine  ?, gabapentin  tapering  1. Suggested Workup for Chronic Fatigue A. Review/Repeat Basic Labs (if not done in last 3-6 months) CBC with differential -- to rule out anemia, leukopenia (already normal) Comprehensive Metabolic Panel (CMP): renal/liver function, glucose, electrolytes (recently normal) TSH, Free T4: (recently normal, but continue to monitor if symptoms persist) Iron  studies, ferritin, B12, folate: (already normal) Vitamin D : (now repleted, stable >30 ng/mL) Inflammatory markers: ESR/CRP (already normal) UA: to rule out persistent UTI/proteinuria (recently negative culture) Random glucose/HbA1c: (recently normal, A1c 5.1%) B. Additional Considerations Sleep study (polysomnography): If OSA is suspected to be undertreated or if sleep fragmentation is significant and not explained by other causes. Cardiac workup: EKG, consider Holter/event monitor if concern for arrhythmia (history of possible AF, Apple Watch monitoring ongoing) Review for occult malignancy or chronic infection: Based on history and exam, no red flags. Consider CXR or age-appropriate  cancer screening if not up to date. Review for medication side effects and polypharmacy C. Focused History/Exam Sleep: Sleep hygiene, sleep duration, quality, nocturia, pain, OSA symptoms (snoring, witnessed apnea, morning headaches) Mood: Depression/anxiety screen (PHQ2/9 already negative) Pain: Chronic pain, impact on sleep/function Nutrition: Unintentional weight loss, appetite Recent medication changes Functional status: ADLs, falls, mobility  2. Medications That May Cause Fatigue in This Patient Most likely contributors in this patient: A. CNS Depressants/Sedating Medications Gabapentin  (being tapered): Can cause somnolence, dizziness, and fatigue, especially in elderly and with CKD. Venlafaxine  (Effexor ) XR 75 mg: Can cause fatigue, especially at higher doses or during titration. Primidone : Can cause sedation, fatigue, and ataxia. Propranolol : Beta-blockers can cause fatigue, exercise intolerance, and bradycardia. Methocarbamol , tizanidine , tramadol : All are CNS depressants, can cause drowsiness/fatigue (methocarbamol  is scheduled, tizanidine /tramadol  PRN). Calcitonin nasal spray: Rarely, may cause malaise/fatigue. B. Other Possible Contributors Levothyroxine : Dose recently reduced (now 100 mcg), but TSH is normal. Both over- and under-treatment can cause fatigue. Statins (rosuvastatin , ezetimibe , evolocumab ): Myalgias and fatigue are possible, but less likely primary cause. Anticholinergics (trospium): Can cause cognitive slowing/fatigue, especially in elderly. Nifedipine : May cause fatigue, but less commonly. Polypharmacy: Drug-drug interactions and cumulative side effects.  3. Summary Table -- Medication Fatigue Risk Medication Fatigue/Sedation Risk Notes  Gabapentin  (tapering) High CNS depression, more with renal impairment  Venlafaxine  XR Moderate Dose-dependent, more at higher doses  Primidone  High CNS depression, ataxia, fatigue  Propranolol  Moderate Bradycardia, exercise  intolerance  Methocarbamol  High CNS depression, sedation  Tizanidine  (PRN) High CNS depression, hypotension  Tramadol  (PRN) Moderate CNS depression  Statins Low-Moderate Myalgias, fatigue (rare)  Trospium Moderate Anticholinergic, cognitive slowing  Nifedipine  Low-Moderate Can cause fatigue  Levothyroxine  Low-Moderate Both hypo- and hyperthyroid states cause fatigue   4. Next Steps / Recommendations A. Medication Review Continue gabapentin  taper (monitor for withdrawal and improvement in fatigue) Consider reducing venlafaxine  to 37.5 mg if fatigue persists Evaluate necessity of  methocarbamol , tizanidine , primidone  (CNS depressants) Monitor for bradycardia (propranolol ) -- HR 62, no syncope Review all medications for dose, necessity, and interactions B. Non-Pharmacologic Optimize sleep hygiene, address OSA if present Encourage physical activity as tolerated Monitor nutrition and hydration C. Follow-up Weekly visits until fatigue resolves or stabilizes Reassess after medication adjustments Consider sleep study or further cardiac evaluation if no improvement  5. Template -- Suggested Workup for Chronic Fatigue Suggested Workup for Chronic Fatigue: CBC with differential CMP (electroly   5. Suggested Workup for Chronic Fatigue (Continued) Already performed and normal: CBC, iron  studies, B12, folate, TSH, ESR, HbA1c, UA/culture, calcium , vitamin D , ferritin If not recently done, consider the following: 3. Liver function tests (if not part of recent CMP): Fatigue can be a presenting symptom of hepatic dysfunction. 4. Cortisol (AM level): Consider if clinical suspicion for adrenal insufficiency (rare, but possible in elderly, especially with chronic steroid exposure or symptoms of orthostatic hypotension, weight loss). 5. Sleep study (Polysomnography): Indicated if OSA is suspected to be contributing, especially given history of OSA, fragmented sleep, and no current CPAP use. Holter/event  monitor:  If there is suspicion of intermittent arrhythmia (history of irregular heartbeat, possible AFib, and Apple Watch findings not definitive). Age-appropriate cancer screening:  If not up to date, especially with family history of pancreatic and breast cancer. Consider CXR if any pulmonary symptoms or as part of general fatigue workup in elderly. Review for less common causes:  Consider celiac serologies, ANA, or other autoimmune screening if clinical suspicion arises, but not routine without suggestive symptoms.  6. Medication Review and Deprescribing Polypharmacy is a major risk factor for fatigue in elderly patients. Actively tapering: Gabapentin  (monitor for withdrawal and improvement in fatigue) Consider reducing: Venlafaxine  (Effexor ) XR to 37.5 mg if fatigue persists Review necessity of: Methocarbamol , tizanidine , primidone  (all CNS depressants) Monitor for: Propranolol -induced bradycardia (HR in low 60s, but no syncope) Assess for cumulative anticholinergic burden: Trospium, others Deprescribing strategy: Taper and discontinue any non-essential CNS depressants Minimize anticholinergic medications if possible Monitor for improvement in fatigue after each medication change Reassess medication list at each visit  7. Clinical Follow-Up and Patient Education Weekly follow-up until fatigue resolves or stabilizes, or until medication changes are complete Monitor:  Fatigue severity and impact on ADLs Sleep quality and duration Heart rate and rhythm (continue Apple Watch monitoring) Educate patient:  Importance of medication adherence and reporting new or worsening symptoms Sleep hygiene measures Signs of medication side effects or withdrawal (especially as CNS depressants are tapered) Coordinate care:  With neurology (for tremor and primidone  review) With cardiology (if arrhythmia is confirmed or suspected) With sleep medicine (if sleep study is indicated)  8. Documentation  Template for Note Assessment: Chronic fatigue, persistent over 1 year, multifactorial in etiology. Workup to date negative for anemia, hypothyroidism, vitamin deficiencies, inflammatory or infectious causes. Fatigue may be exacerbated by polypharmacy (gabapentin , venlafaxine , primidone , propranolol , methocarbamol , tizanidine ), chronic pain, and untreated OSA. No evidence of depression or unintentional weight loss. Plan: Continue tapering gabapentin ; consider reducing venlafaxine  XR if no improvement Review and minimize CNS depressants and anticholinergic medications Weekly follow-up to monitor fatigue and medication side effects Consider sleep study if sleep disturbance persists Continue monitoring for arrhythmia with personal device Reassess after each medication change; escalate workup if no improvement  9. Summary Table: Common Causes of Fatigue in Elderly Etiology Status in This Patient Notes  Anemia Ruled out Normal CBC  Hypothyroidism Ruled out Normal TSH, on replacement  Vitamin deficiency (B12, D, folate) Ruled out  All normal/repleted  Chronic infection/inflammation Unlikely ESR/CRP normal, UA/culture negative  Cardiac disease Under evaluation Stable CAD, Apple Watch monitoring  Sleep disturbance/OSA Possible contributor Fragmented sleep, no current CPAP  Depression Ruled out PHQ-2 consistently 0  Medication side effects Likely contributor Multiple CNS depressants, polypharmacy  Malignancy No red flags Up to date on screening, no weight loss  Pain Possible contributor Chronic back pain   References for Clinical Guidance UpToDate: Approach to the adult patient with fatigue American Geriatrics Society [place] Criteria for Potentially Inappropriate Medication Use in Older Adults NICE Guideline [NG73]: Chronic fatigue syndrome/myalgic encephalomyelitis (or encephalopathy): diagnosis and management  In summary: This patient's chronic fatigue is likely multifactorial, with  medication side effects (notably CNS depressants and polypharmacy), sleep disturbance/OSA, and chronic pain being the most probable contributors. Continue systematic medication review/taper, optimize sleep, and monitor closely with weekly reassessment.

## 2024-04-17 NOTE — Progress Notes (Signed)
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 - 15.5 % Final   Platelets 01/24/2024 173.0  150.0 - 400.0 K/uL Final   Neutrophils Relative % 01/24/2024 37.3 (L)  43.0 - 77.0 % Final   Lymphocytes Relative 01/24/2024 52.8 (H)  12.0 - 46.0 % Final   Monocytes Relative 01/24/2024 5.3  3.0 - 12.0 % Final   Eosinophils Relative 01/24/2024 4.0  0.0 - 5.0 % Final   Basophils Relative 01/24/2024 0.6  0.0 - 3.0 % Final   Neutro Abs 01/24/2024 1.7  1.4 - 7.7 K/uL Final   Lymphs Abs 01/24/2024 2.4  0.7 - 4.0 K/uL Final   Monocytes Absolute 01/24/2024 0.2  0.1 - 1.0 K/uL Final   Eosinophils Absolute 01/24/2024 0.2  0.0 - 0.7 K/uL Final   Basophils Absolute 01/24/2024 0.0  0.0 - 0.1 K/uL Final   Sodium 01/24/2024 140  135 - 145 mEq/L Final   Potassium 01/24/2024 4.2  3.5 - 5.1 mEq/L Final   Chloride 01/24/2024 106  96 - 112 mEq/L Final   CO2 01/24/2024 26  19 - 32 mEq/L Final   Glucose, Bld 01/24/2024 80  70 - 99 mg/dL Final   BUN 95/84/7974 12  6 - 23 mg/dL Final   Creatinine, Ser 01/24/2024 0.77  0.40 - 1.20 mg/dL Final   GFR 95/84/7974 73.01  >60.00 mL/min Final   Calcium  01/24/2024 9.2  8.4 - 10.5 mg/dL Final   Ferritin 95/84/7974 28.2  10.0 -  291.0 ng/mL Final   TSH  W/REFLEX TO FT4 01/24/2024 2.79  0.40 - 4.50 mIU/L Final   MICRO NUMBER: 01/24/2024 [id number]   Final   SPECIMEN QUALITY: 01/24/2024 Adequate   Final   Sample Source 01/24/2024 URINE   Final   STATUS: 01/24/2024 FINAL   Final   Result: 01/24/2024 No Growth   Final   REFLEXIVE URINE CULTURE 01/24/2024    Final  Office Visit on 12/20/2023  Component Date Value Ref Range Status   Vitamin B-12 12/20/2023 895  211 - 911 pg/mL Final   Folate 12/20/2023 >25.2  >5.9 ng/mL Final   Iron  12/20/2023 130  45 - 160 mcg/dL Final   TIBC 96/88/7974 269  250 - 450 mcg/dL (calc) Final   %SAT 96/88/7974 48 (H)  16 - 45 % (calc) Final   Ferritin 12/20/2023 27  16 - 288 ng/mL Final   TSH W/REFLEX TO FT4 12/20/2023 2.57  0.40 - 4.50 mIU/L Final   Color, Urine 12/20/2023 Dark Yellow (A)  Yellow;Lt. Yellow;Straw;Dark Yellow;Amber;Green;Red;Brown Final   APPearance 12/20/2023 Cloudy (A)  Clear;Turbid;Slightly Cloudy;Cloudy Final   Specific Gravity, Urine 12/20/2023 1.025  1.000 - 1.030 Final   pH 12/20/2023 6.0  5.0 - 8.0 Final   Total Protein, Urine 12/20/2023 NEGATIVE  Negative Final   Urine Glucose 12/20/2023 NEGATIVE  Negative Final   Ketones, ur 12/20/2023 NEGATIVE  Negative Final   Bilirubin Urine 12/20/2023 NEGATIVE  Negative Final   Hgb urine dipstick 12/20/2023 NEGATIVE  Negative Final   Urobilinogen, UA 12/20/2023 0.2  0.0 - 1.0 Final   Leukocytes,Ua 12/20/2023 NEGATIVE  Negative Final   Nitrite 12/20/2023 NEGATIVE  Negative Final   WBC, UA 12/20/2023 21-50/hpf (A)  0-2/hpf Final   RBC / HPF 12/20/2023 0-2/hpf  0-2/hpf Final   Mucus, UA 12/20/2023 Presence of (A)  None Final   Squamous Epithelial / HPF 12/20/2023 Few(5-10/hpf) (A)  Rare(0-4/hpf) Final   Renal Epithel, UA 12/20/2023 Few(5-10/hpf) (A)  None Final   Bacteria, UA 12/20/2023 Few(10-50/hpf) (A)  None Final   Hyaline Casts, UA 12/20/2023 Presence of (A)  None Final   Amorphous 12/20/2023 Present (A)  None;Present Final   Sodium  12/20/2023 141  135 - 145 mEq/L Final   Potassium 12/20/2023 3.6  3.5 - 5.1 mEq/L Final   Chloride 12/20/2023 105  96 - 112 mEq/L Final   CO2 12/20/2023 29  19 - 32 mEq/L Final   Glucose, Bld 12/20/2023 96  70 - 99 mg/dL Final   BUN 96/88/7974 14  6 - 23 mg/dL Final   Creatinine, Ser 12/20/2023 0.83  0.40 - 1.20 mg/dL Final   Total Bilirubin 12/20/2023 0.3  0.2 - 1.2 mg/dL Final   Alkaline Phosphatase 12/20/2023 53  39 - 117 U/L Final   AST 12/20/2023 16  0 - 37 U/L Final   ALT 12/20/2023 13  0 - 35 U/L Final   Total Protein 12/20/2023 7.2  6.0 - 8.3 g/dL Final   Albumin 96/88/7974 4.5  3.5 - 5.2 g/dL Final   GFR 96/88/7974 66.77  >60.00 mL/min Final   Calcium  12/20/2023 9.9  8.4 - 10.5 mg/dL Final   Sed Rate 96/88/7974 3  0 - 30 mm/hr Final  Office Visit on 12/13/2023  Component Date Value Ref Range Status   Color, Urine 12/13/2023 YELLOW  YELLOW Final   APPearance 12/13/2023 CLOUDY (A)  CLEAR Final   Specific Gravity, Urine 12/13/2023 1.026  1.001 - 1.035 Final   pH 12/13/2023 < OR =  5.0  5.0 - 8.0 Final   Glucose, UA 12/13/2023 NEGATIVE  NEGATIVE Final   Bilirubin Urine 12/13/2023 NEGATIVE  NEGATIVE Final   Ketones, ur 12/13/2023 TRACE (A)  NEGATIVE Final   Hgb urine dipstick 12/13/2023 NEGATIVE  NEGATIVE Final   Protein, ur 12/13/2023 NEGATIVE  NEGATIVE Final   Nitrites, Initial 12/13/2023 NEGATIVE  NEGATIVE Final   Leukocyte Esterase 12/13/2023 TRACE (A)  NEGATIVE Final   WBC, UA 12/13/2023 20-40 (A)  0 - 5 /HPF Final   RBC / HPF 12/13/2023 NONE SEEN  0 - 2 /HPF Final   Squamous Epithelial / HPF 12/13/2023 PACKED (A)  < OR = 5 /HPF Final   Bacteria, UA 12/13/2023 NONE SEEN  NONE SEEN /HPF Final   Hyaline Cast 12/13/2023 0-5 (A)  NONE SEEN /LPF Final   Note 12/13/2023    Final   MICRO NUMBER: 12/13/2023 [id number]   Final   SPECIMEN QUALITY: 12/13/2023 Adequate   Final   Sample Source 12/13/2023 URINE   Final   STATUS: 12/13/2023 FINAL   Final   Result: 12/13/2023 No Growth    Final   REFLEXIVE URINE CULTURE 12/13/2023    Final  Office Visit on 11/09/2023  Component Date Value Ref Range Status   Color, Urine 11/09/2023 YELLOW  YELLOW Final   APPearance 11/09/2023 CLEAR  CLEAR Final   Specific Gravity, Urine 11/09/2023 1.014  1.001 - 1.035 Final   pH 11/09/2023 6.5  5.0 - 8.0 Final   Glucose, UA 11/09/2023 NEGATIVE  NEGATIVE Final   Bilirubin Urine 11/09/2023 NEGATIVE  NEGATIVE Final   Ketones, ur 11/09/2023 NEGATIVE  NEGATIVE Final   Hgb urine dipstick 11/09/2023 NEGATIVE  NEGATIVE Final   Protein, ur 11/09/2023 NEGATIVE  NEGATIVE Final   Nitrites, Initial 11/09/2023 NEGATIVE  NEGATIVE Final   Leukocyte Esterase 11/09/2023 3+ (A)  NEGATIVE Final   WBC, UA 11/09/2023 20-40 (A)  0 - 5 /HPF Final   RBC / HPF 11/09/2023 3-10 (A)  0 - 2 /HPF Final   Squamous Epithelial / HPF 11/09/2023 0-5  < OR = 5 /HPF Final   Bacteria, UA 11/09/2023 NONE SEEN  NONE SEEN /HPF Final   Calcium  Oxalate Crystal 11/09/2023 FEW  NONE OR FEW /HPF Final   Hyaline Cast 11/09/2023 NONE SEEN  NONE SEEN /LPF Final   Note 11/09/2023    Final   MICRO NUMBER: 11/09/2023 [id number]   Final   SPECIMEN QUALITY: 11/09/2023 Adequate   Final   Sample Source 11/09/2023 URINE   Final   STATUS: 11/09/2023 FINAL   Final   Result: 11/09/2023    Final                   Value:Mixed genital flora isolated. These superficial bacteria are not indicative of a urinary tract infection. No further organism identification is warranted on this specimen. If clinically indicated, recollect clean-catch, mid-stream urine and transfer  immediately to Urine Culture Transport Tube.    REFLEXIVE URINE CULTURE 11/09/2023    Final  Office Visit on 10/31/2023  Component Date Value Ref Range Status   Color, Urine 10/31/2023 YELLOW  Yellow;Lt. Yellow;Straw;Dark Yellow;Amber;Green;Red;Brown Final   APPearance 10/31/2023 Turbid (A)  Clear;Turbid;Slightly Cloudy;Cloudy Final   Specific Gravity, Urine 10/31/2023 1.015  1.000 -  1.030 Final   pH 10/31/2023 6.0  5.0 - 8.0 Final   Total Protein, Urine 10/31/2023 NEGATIVE  Negative Final   Urine Glucose 10/31/2023 NEGATIVE  Negative Final   Ketones, ur 10/31/2023 NEGATIVE  Negative  Final   Bilirubin Urine 10/31/2023 NEGATIVE  Negative Final   Hgb urine dipstick 10/31/2023 NEGATIVE  Negative Final   Urobilinogen, UA 10/31/2023 0.2  0.0 - 1.0 Final   Leukocytes,Ua 10/31/2023 MODERATE (A)  Negative Final   Nitrite 10/31/2023 NEGATIVE  Negative Final   WBC, UA 10/31/2023 TNTC(>50/hpf) (A)  0-2/hpf Final   RBC / HPF 10/31/2023 none seen  0-2/hpf Final   Squamous Epithelial / HPF 10/31/2023 Few(5-10/hpf) (A)  Rare(0-4/hpf) Final   Bacteria, UA 10/31/2023 Many(>50/hpf) (A)  None Final   TSH 10/31/2023 1.430  0.450 - 4.500 uIU/mL Final   Free T4 10/31/2023 1.14  0.82 - 1.77 ng/dL Final   Sodium 98/79/7974 141  135 - 145 mEq/L Final   Potassium 10/31/2023 4.2  3.5 - 5.1 mEq/L Final   Chloride 10/31/2023 104  96 - 112 mEq/L Final   CO2 10/31/2023 26  19 - 32 mEq/L Final   Glucose, Bld 10/31/2023 69 (L)  70 - 99 mg/dL Final   BUN 98/79/7974 14  6 - 23 mg/dL Final   Creatinine, Ser 10/31/2023 0.92  0.40 - 1.20 mg/dL Final   GFR 98/79/7974 59.06 (L)  >60.00 mL/min Final   Calcium  10/31/2023 9.9  8.4 - 10.5 mg/dL Final   VITD 98/79/7974 35.99  30.00 - 100.00 ng/mL Final  Office Visit on 10/24/2023  Component Date Value Ref Range Status   Color, Urine 10/24/2023 YELLOW  YELLOW Final   APPearance 10/24/2023 TURBID (A)  CLEAR Final   Specific Gravity, Urine 10/24/2023 1.019  1.001 - 1.035 Final   pH 10/24/2023 5.5  5.0 - 8.0 Final   Glucose, UA 10/24/2023 NEGATIVE  NEGATIVE Final   Bilirubin Urine 10/24/2023 NEGATIVE  NEGATIVE Final   Ketones, ur 10/24/2023 NEGATIVE  NEGATIVE Final   Hgb urine dipstick 10/24/2023 TRACE (A)  NEGATIVE Final   Protein, ur 10/24/2023 TRACE (A)  NEGATIVE Final   Nitrites, Initial 10/24/2023 POSITIVE (A)  NEGATIVE Final   Leukocyte Esterase  10/24/2023 3+ (A)  NEGATIVE Final   WBC, UA 10/24/2023 PACKED (A)  0 - 5 /HPF Final   RBC / HPF 10/24/2023 0-2  0 - 2 /HPF Final   Squamous Epithelial / HPF 10/24/2023 6-10 (A)  < OR = 5 /HPF Final   Bacteria, UA 10/24/2023 MANY (A)  NONE SEEN /HPF Final   Calcium  Oxalate Crystal 10/24/2023 FEW  NONE OR FEW /HPF Final   Hyaline Cast 10/24/2023 NONE SEEN  NONE SEEN /LPF Final   Note 10/24/2023    Final   MICRO NUMBER: 10/24/2023 [id number]   Final   SPECIMEN QUALITY: 10/24/2023 Adequate   Final   Sample Source 10/24/2023 URINE   Final   STATUS: 10/24/2023 FINAL   Final   ISOLATE 1: 10/24/2023 Escherichia coli (A)   Final   REFLEXIVE URINE CULTURE 10/24/2023    Final  Office Visit on 10/17/2023  Component Date Value Ref Range Status   Color, Urine 10/17/2023 YELLOW  YELLOW Final   APPearance 10/17/2023 CLOUDY (A)  CLEAR Final   Specific Gravity, Urine 10/17/2023 1.024  1.001 - 1.035 Final   pH 10/17/2023 5.5  5.0 - 8.0 Final   Glucose, UA 10/17/2023 NEGATIVE  NEGATIVE Final   Bilirubin Urine 10/17/2023 NEGATIVE  NEGATIVE Final   Ketones, ur 10/17/2023 TRACE (A)  NEGATIVE Final   Hgb urine dipstick 10/17/2023 NEGATIVE  NEGATIVE Final   Protein, ur 10/17/2023 TRACE (A)  NEGATIVE Final   Nitrite 10/17/2023 NEGATIVE  NEGATIVE Final  Leukocytes,Ua 10/17/2023 3+ (A)  NEGATIVE Final   WBC, UA 10/17/2023 > OR = 60 (A)  0 - 5 /HPF Final   RBC / HPF 10/17/2023 0-2  0 - 2 /HPF Final   Squamous Epithelial / HPF 10/17/2023 0-5  < OR = 5 /HPF Final   Bacteria, UA 10/17/2023 MODERATE (A)  NONE SEEN /HPF Final   Hyaline Cast 10/17/2023 NONE SEEN  NONE SEEN /LPF Final  There may be more visits with results that are not included.  No image results found. CT CARDIAC SCORING (SELF PAY ONLY) Addendum Date: 04/05/2024 ADDENDUM REPORT: 04/05/2024 14:25 EXAM: OVER-READ INTERPRETATION  CT CHEST The following report is an over-read performed by radiologist Dr. Andrea Gasman of [facility] on  04/05/2024. This over-read does not include interpretation of cardiac or coronary anatomy or pathology. The coronary calcium  score interpretation by the cardiologist is attached. COMPARISON:  None. FINDINGS: Vascular: Minor aortic atherosclerosis. The included aorta is normal in caliber. Mediastinum/nodes: No adenopathy or mass. Small to moderate-sized hiatal hernia Lungs: No focal airspace disease. No pulmonary nodule. No pleural fluid. The included airways are patent. Upper abdomen: No acute or unexpected findings. Musculoskeletal: There are no acute or suspicious osseous abnormalities. IMPRESSION: Small to moderate-sized hiatal hernia. Aortic Atherosclerosis (ICD10-I70.0). Electronically Signed   By: Andrea Gasman M.D.   On: 04/05/2024 14:25   Result Date: 04/05/2024 CLINICAL DATA:  Risk stratification: 78 Year-old Female EXAM: Coronary Calcium  Score TECHNIQUE: A gated, non-contrast computed tomography scan of the heart was performed using 2.5 mm slice thickness. Axial images were analyzed on a dedicated workstation. Calcium  scoring of the coronary arteries was performed using the Agatston method. FINDINGS: Coronary Calcium  Score: Left main: 0 Left anterior descending artery: 43 Left circumflex artery: 0 Right coronary artery: 19 Total: 62 Percentile: 40th Ascending Aorta: Normal caliber. Aortic atherosclerosis. Aortic Valve Calcium  score: 0 Mitral annular calcification: 0 Pericardium: Normal. Non-cardiac: See separate report from [facility]. IMPRESSION: 1. Coronary calcium  score of 62. This was 40th percentile for age-, race-, and sex-matched controls. 2. Aortic atherosclerosis. RECOMMENDATIONS: Coronary artery calcium  (CAC) score is a strong predictor of incident coronary heart disease (CHD) and provides predictive information beyond traditional risk factors. CAC scoring is reasonable to use in the decision to withhold, postpone, or initiate statin therapy in intermediate-risk or selected  borderline-risk asymptomatic adults (age 37-75 years and LDL-C >=70 to <190 mg/dL) who do not have diabetes or established atherosclerotic cardiovascular disease (ASCVD).* In intermediate-risk (10-year ASCVD risk >=7.5% to <20%) adults or selected borderline-risk (10-year ASCVD risk >=5% to <7.5%) adults in whom a CAC score is measured for the purpose of making a treatment decision the following recommendations have been made: If CAC=0, it is reasonable to withhold statin therapy and reassess in 5 to 10 years, as long as higher risk conditions are absent (diabetes mellitus, family history of premature CHD in first degree relatives (males <55 years; females <65 years), cigarette smoking, or LDL >=190 mg/dL). If CAC is 1 to 99, it is reasonable to initiate statin therapy for patients >=63 years of age. If CAC is >=100 or >=75th percentile, it is reasonable to initiate statin therapy at any age. Cardiology referral should be considered for patients with CAC scores >=400 or >=75th percentile. *2018 AHA/ACC/AACVPR/AAPA/ABC/ACPM/ADA/AGS/APhA/ASPC/NLA/PCNA Guideline on the Management of Blood Cholesterol: A Report of the American College of Cardiology/American Heart Association Task Force on Clinical Practice Guidelines. J Am Coll Cardiol. 2019;73(24):3168-3209. Stanly Leavens, MD Electronically Signed: By: Stanly  Santo M.D. On: 03/21/2024 21:37          ASSESSMENT & PLAN   Assessment & Plan Chronic fatigue Chronic Fatigue: Persistent fatigue lasting >12 months is not at goal despite normal thyroid  function (TSH 1.43) and corrected iron  deficiency. Diagnostic workup is initiated with orders for TSH/free T4, vitamin D  (25-OH), ferritin, ESR, CRP, ANA, celiac disease panel with reflexes, and morning cortisol to evaluate for autoimmune or inflammatory causes. Continue vitamin D  supplementation with planned recheck of iron  indices. Follow up in 1 week pending results. Chronic back pain greater than  3 months duration Chronic low back pain at the prior L1 fracture site persists despite current pain management (gabapentin  and intermittent tizanidine ). Previous dry needling attempts were unsuccessful due to follow?up issues. A new ambulatory referral for physical therapy emphasizing dry needling is placed. Continue current analgesics with ongoing assessment of functional status. Tremor Tremor is stable and adequately controlled on propranolol  10?mg by mouth twice daily, despite its potential contribution to fatigue. Continue propranolol  with monitoring for therapeutic efficacy and adverse effects. Medication management The complex medication regimen is actively reviewed for interactions and therapeutic effectiveness. Advise administration of the RSV vaccine given her age and associated high risk; ensure routine re?assessment of bone health and recommended cancer screenings per guidelines. Overall, conditions requiring active management (chronic fatigue workup and suboptimal hyperlipidemia) have prompted diagnostic and therapeutic interventions. Plan includes re-evaluation at the next appointment on July 22 with lab review and clinical reassessment. Acquired hypothyroidism Condition is stable on levothyroxine  100?mcg (increased from 88?mcg) with documented TSH at 1.43. No adjustments are needed at this time. Continue levothyroxine  with routine monitoring of thyroid  function. Polypharmacy The complex medication regimen is actively reviewed for interactions and therapeutic effectiveness. Advise administration of the RSV vaccine given her age and associated high risk; ensure routine re?assessment of bone health and recommended cancer screenings per guidelines. Overall, conditions requiring active management (chronic fatigue workup and suboptimal hyperlipidemia) have prompted diagnostic and therapeutic interventions. Plan includes re-evaluation at the next appointment on July 22 with lab review and clinical  reassessment. Mixed hyperlipidemia LDL remains elevated at 121?mg/dL despite high?intensity therapy (rosuvastatin  40?mg, ezetimibe  10?mg) and recent initiation of Repatha  (administered 07/05) to drive LDL <59?fh/iO. Active prescription drug management is in place. Continue current lipid regimen with planned cholesterol recheck in 3 months.    ORDER ASSOCIATIONS  #   DIAGNOSIS / CONDITION ICD-10 ENCOUNTER ORDER     ICD-10-CM   1. Chronic fatigue  R53.82 TSH + free T4    Vitamin D  (25 hydroxy)    Ferritin    Sedimentation rate    C-reactive protein    Antinuclear Antib (ANA)    Celiac Disease Comprehensive Panel with Reflexes    Cortisol    2. Chronic back pain greater than 3 months duration  M54.9 Ambulatory referral to Physical Therapy   G89.29     3. Tremor  R25.1 propranolol  (INDERAL ) 10 MG tablet    4. Medication management  Z79.899     5. Acquired hypothyroidism  E03.9 TSH + free T4    6. Polypharmacy  Z79.899           Orders Placed in Encounter:   Meds ordered this encounter  Medications   propranolol  (INDERAL ) 10 MG tablet    Sig: Take 1 tablet (10 mg total) by mouth 2 (two) times daily.    Dispense:  180 tablet    Refill:  3   Per patient request wants propranolol  to  [facility]    This document was synthesized by artificial intelligence (Abridge) using HIPAA-compliant recording of the clinical interaction;   We discussed the use of AI scribe software for clinical note transcription with the patient, who gave verbal consent to proceed. additional Info: This encounter employed state-of-the-art, real-time, collaborative documentation. The patient actively reviewed and assisted in updating their electronic medical record on a shared screen, ensuring transparency and facilitating joint problem-solving for the problem list, overview, and plan. This approach promotes accurate, informed care. The treatment plan was discussed and reviewed in  detail, including medication safety, potential side effects, and all patient questions. We confirmed understanding and comfort with the plan. Follow-up instructions were established, including contacting the office for any concerns, returning if symptoms worsen, persist, or new symptoms develop, and precautions for potential emergency department visits.

## 2024-04-17 NOTE — Assessment & Plan Note (Signed)
 Condition is stable on levothyroxine  100?mcg (increased from 88?mcg) with documented TSH at 1.43. No adjustments are needed at this time. Continue levothyroxine  with routine monitoring of thyroid  function.

## 2024-04-18 ENCOUNTER — Ambulatory Visit: Payer: Self-pay | Admitting: Internal Medicine

## 2024-04-20 LAB — CELIAC DISEASE COMPREHENSIVE PANEL WITH REFLEXES
(tTG) Ab, IgA: <1 U/mL
Immunoglobulin A: 196 mg/dL (ref 70–320)

## 2024-04-20 LAB — TSH+FREE T4: TSH W/REFLEX TO FT4: 1.97 m[IU]/L (ref 0.40–4.50)

## 2024-04-20 LAB — ANA: Anti Nuclear Antibody (ANA): POSITIVE — AB

## 2024-04-20 LAB — ANTI-NUCLEAR AB-TITER (ANA TITER): ANA Titer 1: 1:80 {titer} — ABNORMAL HIGH

## 2024-04-23 ENCOUNTER — Other Ambulatory Visit: Payer: Self-pay

## 2024-05-01 ENCOUNTER — Encounter: Payer: Self-pay | Admitting: Internal Medicine

## 2024-05-01 ENCOUNTER — Other Ambulatory Visit (HOSPITAL_BASED_OUTPATIENT_CLINIC_OR_DEPARTMENT_OTHER): Payer: Self-pay

## 2024-05-01 ENCOUNTER — Ambulatory Visit (INDEPENDENT_AMBULATORY_CARE_PROVIDER_SITE_OTHER): Admitting: Internal Medicine

## 2024-05-01 VITALS — BP 100/62 | HR 81 | Temp 98.0°F | Ht 62.0 in | Wt 126.4 lb

## 2024-05-01 DIAGNOSIS — R768 Other specified abnormal immunological findings in serum: Secondary | ICD-10-CM | POA: Diagnosis not present

## 2024-05-01 DIAGNOSIS — S3992XD Unspecified injury of lower back, subsequent encounter: Secondary | ICD-10-CM | POA: Diagnosis not present

## 2024-05-01 DIAGNOSIS — M5416 Radiculopathy, lumbar region: Secondary | ICD-10-CM

## 2024-05-01 DIAGNOSIS — E039 Hypothyroidism, unspecified: Secondary | ICD-10-CM

## 2024-05-01 DIAGNOSIS — M8000XS Age-related osteoporosis with current pathological fracture, unspecified site, sequela: Secondary | ICD-10-CM

## 2024-05-01 DIAGNOSIS — E611 Iron deficiency: Secondary | ICD-10-CM

## 2024-05-01 DIAGNOSIS — R5382 Chronic fatigue, unspecified: Secondary | ICD-10-CM

## 2024-05-01 DIAGNOSIS — G250 Essential tremor: Secondary | ICD-10-CM | POA: Diagnosis not present

## 2024-05-01 MED ORDER — TIZANIDINE HCL 2 MG PO TABS
2.0000 mg | ORAL_TABLET | Freq: Four times a day (QID) | ORAL | 4 refills | Status: DC | PRN
  Filled 2024-05-01 (×2): qty 180, 45d supply, fill #0
  Filled 2024-05-25 – 2024-06-04 (×2): qty 180, 45d supply, fill #1

## 2024-05-01 MED ORDER — CITRACAL +D3 PO TABS
1.0000 | ORAL_TABLET | Freq: Every day | ORAL | 3 refills | Status: AC
  Filled 2024-05-01: qty 90, 90d supply, fill #0

## 2024-05-01 MED ORDER — VITAMIN D3 125 MCG (5000 UT) PO CAPS
5000.0000 [IU] | ORAL_CAPSULE | Freq: Every day | ORAL | 3 refills | Status: AC
  Filled 2024-05-01: qty 100, 100d supply, fill #0

## 2024-05-01 NOTE — Assessment & Plan Note (Signed)
 Iron  levels are low, contributing to fatigue. She is currently on iron  supplementation. Hemoglobin is at 12.7, but iron  remains low. Continue iron  supplementation for 12 weeks and recheck iron  levels after the supplementation period.

## 2024-05-01 NOTE — Assessment & Plan Note (Signed)
 Chronic back pain is due to a vertebral compression fracture at L1 with significant height loss. Pain persists despite previous treatments. She is scheduled to see a physical medicine and rehab specialist next week. Dry needling and radiofrequency ablation are potential future options if current treatments fail. Risks of radiofrequency ablation include potential tremors, as noted in a similar case. Attend the physical medicine and rehab appointment next week. Consider dry needling and discuss radiofrequency ablation if pain persists.

## 2024-05-01 NOTE — Assessment & Plan Note (Signed)
 She is receiving Evenity  (romosozumab ) injections every 14 days for osteoporosis. Vitamin D  levels are improving but still on the low side for osteoporosis management. Calcium  intake needs to be optimized for better vitamin D  efficacy. Vitamin D3 is preferred due to better conversion in those with renal issues. Continue Evenity  injections every 14 days. Increase vitamin D3 to 5000 IU daily. Ensure calcium  intake with meals or supplement with calcium  citrate.

## 2024-05-01 NOTE — Assessment & Plan Note (Signed)
 Persistent fatigue continues without worsening. Possible causes include gabapentin  accumulation due to renal function, low iron , or an autoimmune flare. Gabapentin  is not taken in high doses, so it is unlikely to be the cause, but a trial off is suggested. Low iron  is present but not believed to be the primary cause. An autoimmune flare is possible given positive ANA results. Other potential contributors include propranolol  and primidone , though less likely. Trial off gabapentin  to assess impact on fatigue. Continue iron  supplementation for 12 weeks. Discuss fatigue with rheumatologist in the upcoming appointment.

## 2024-05-01 NOTE — Progress Notes (Signed)
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 10/17/2023 NEGATIVE  NEGATIVE Final   Ketones, ur 10/17/2023 TRACE (A)  NEGATIVE Final   Hgb urine dipstick 10/17/2023 NEGATIVE  NEGATIVE Final   Protein, ur 10/17/2023 TRACE (A)  NEGATIVE Final   Nitrite 10/17/2023 NEGATIVE  NEGATIVE Final   Leukocytes,Ua 10/17/2023 3+ (A)  NEGATIVE Final   WBC, UA 10/17/2023 > OR = 60 (A)  0 - 5 /HPF Final   RBC / HPF 10/17/2023 0-2  0 - 2 /HPF Final   Squamous Epithelial / HPF 10/17/2023 0-5  < OR = 5 /HPF Final   Bacteria, UA 10/17/2023 MODERATE (A)  NONE SEEN /HPF Final   Hyaline Cast 10/17/2023 NONE SEEN  NONE SEEN /LPF Final  There may be more visits with results that are not included.  No image results found. CT CARDIAC SCORING (SELF PAY ONLY) Addendum Date: 04/05/2024 ADDENDUM REPORT: 04/05/2024 14:25 EXAM: OVER-READ INTERPRETATION  CT CHEST The following report is an over-read performed by radiologist Dr. Andrea Gasman of [facility] on 04/05/2024. This over-read does not include interpretation of cardiac or  coronary anatomy or pathology. The coronary calcium  score interpretation by the cardiologist is attached. COMPARISON:  None. FINDINGS: Vascular: Minor aortic atherosclerosis. The included aorta is normal in caliber. Mediastinum/nodes: No adenopathy or mass. Small to moderate-sized hiatal hernia Lungs: No focal airspace disease. No pulmonary nodule. No pleural fluid. The included airways are patent. Upper abdomen: No acute or unexpected findings. Musculoskeletal: There are no acute or suspicious osseous abnormalities. IMPRESSION: Small to moderate-sized hiatal hernia. Aortic Atherosclerosis (ICD10-I70.0). Electronically Signed   By: Andrea Gasman M.D.   On: 04/05/2024 14:25   Result Date: 04/05/2024 CLINICAL DATA:  Risk stratification: 78 Year-old Female EXAM: Coronary Calcium  Score TECHNIQUE: A gated, non-contrast computed tomography scan of  the heart was performed using 2.5 mm slice thickness. Axial images were analyzed on a dedicated workstation. Calcium  scoring of the coronary arteries was performed using the Agatston method. FINDINGS: Coronary Calcium  Score: Left main: 0 Left anterior descending artery: 43 Left circumflex artery: 0 Right coronary artery: 19 Total: 62 Percentile: 40th Ascending Aorta: Normal caliber. Aortic atherosclerosis. Aortic Valve Calcium  score: 0 Mitral annular calcification: 0 Pericardium: Normal. Non-cardiac: See separate report from [facility]. IMPRESSION: 1. Coronary calcium  score of 62. This was 40th percentile for age-, race-, and sex-matched controls. 2. Aortic atherosclerosis. RECOMMENDATIONS: Coronary artery calcium  (CAC) score is a strong predictor of incident coronary heart disease (CHD) and provides predictive information beyond traditional risk factors. CAC scoring is reasonable to use in the decision to withhold, postpone, or initiate statin therapy in intermediate-risk or selected borderline-risk asymptomatic adults (age 59-75 years and LDL-C >=70 to <190  mg/dL) who do not have diabetes or established atherosclerotic cardiovascular disease (ASCVD).* In intermediate-risk (10-year ASCVD risk >=7.5% to <20%) adults or selected borderline-risk (10-year ASCVD risk >=5% to <7.5%) adults in whom a CAC score is measured for the purpose of making a treatment decision the following recommendations have been made: If CAC=0, it is reasonable to withhold statin therapy and reassess in 5 to 10 years, as long as higher risk conditions are absent (diabetes mellitus, family history of premature CHD in first degree relatives (males <55 years; females <65 years), cigarette smoking, or LDL >=190 mg/dL). If CAC is 1 to 99, it is reasonable to initiate statin therapy for patients >=45 years of age. If CAC is >=100 or >=75th percentile, it is reasonable to initiate statin therapy at any age. Cardiology referral should be considered for patients with CAC scores >=400 or >=75th percentile. *2018 AHA/ACC/AACVPR/AAPA/ABC/ACPM/ADA/AGS/APhA/ASPC/NLA/PCNA Guideline on the Management of Blood Cholesterol: A Report of the American College of Cardiology/American Heart Association Task Force on Clinical Practice Guidelines. J Am Coll Cardiol. 2019;73(24):3168-3209. Stanly Leavens, MD Electronically Signed: By: Stanly Leavens M.D. On: 03/21/2024 21:37         ASSESSMENT & PLAN   Assessment & Plan Injury of back, subsequent encounter Chronic back pain is due to a vertebral compression fracture at L1 with significant height loss. Pain persists despite previous treatments. She is scheduled to see a physical medicine and rehab specialist next week. Dry needling and radiofrequency ablation are potential future options if current treatments fail. Risks of radiofrequency ablation include potential tremors, as noted in a similar case. Attend the physical medicine and rehab appointment next week. Consider dry needling and discuss radiofrequency ablation if pain persists. Lumbar  radiculopathy Chronic back pain is due to a vertebral compression fracture at L1 with significant height loss. Pain persists despite previous treatments. She is scheduled to see a physical medicine and rehab specialist next week. Dry needling and radiofrequency ablation are potential future options if current treatments fail. Risks of radiofrequency ablation include potential tremors, as noted in a similar case. Attend the physical medicine and rehab appointment next week. Consider dry needling and discuss radiofrequency ablation if pain persists. Age-related osteoporosis with current pathological fracture, sequela She is receiving Evenity  (romosozumab ) injections every 14 days for osteoporosis. Vitamin D  levels are improving but still on the low side for osteoporosis management. Calcium  intake needs to be optimized for better vitamin D  efficacy. Vitamin D3 is preferred due to better conversion in those with renal issues. Continue Evenity  injections every 14 days. Increase vitamin D3 to 5000 IU daily. Ensure calcium   intake with meals or supplement with calcium  citrate. Iron  deficiency Iron  levels are low, contributing to fatigue. She is currently on iron  supplementation. Hemoglobin is at 12.7, but iron  remains low. Continue iron  supplementation for 12 weeks and recheck iron  levels after the supplementation period. Chronic fatigue Persistent fatigue continues without worsening. Possible causes include gabapentin  accumulation due to renal function, low iron , or an autoimmune flare. Gabapentin  is not taken in high doses, so it is unlikely to be the cause, but a trial off is suggested. Low iron  is present but not believed to be the primary cause. An autoimmune flare is possible given positive ANA results. Other potential contributors include propranolol  and primidone , though less likely. Trial off gabapentin  to assess impact on fatigue. Continue iron  supplementation for 12 weeks. Discuss fatigue with  rheumatologist in the upcoming appointment. Antinuclear antibody (ANA) positive Positive ANA with a nuclear speckled pattern suggests a possible autoimmune syndrome such as mixed connective tissue disease, lupus, Sjogren's, dermatomyositis, or systemic sclerosis. She has Raynaud's phenomenon. Further evaluation by a rheumatologist is needed. Autoimmune syndromes that develop later in life are generally less threatening due to a weaker immune system. Discuss ANA results and potential autoimmune syndrome with a rheumatologist in the upcoming appointment. Essential tremor She has essential tremor, which is managed with primidone . There are concerns about potential exacerbation with the nerve ablation procedure. Continue current management with primidone . Discuss potential risks of nerve ablation with specialists if considered. Acquired hypothyroidism Thyroid  levels have been well-managed on the current levothyroxine  dose of 100 mcg daily for the past nine months. Continue the current dose of levothyroxine  100 mcg daily.  General Health Maintenance   Advised to simplify the medication regimen by consolidating vitamins and supplements into fewer pills. A multivitamin should include folic acid . Vitamin D  should be taken with calcium  for optimal absorption. Consult a pharmacist to consolidate vitamins and supplements into fewer pills. Ensure the multivitamin includes folic acid . Take vitamin D  with a calcium -containing meal or supplement.  Follow-up   She has a follow-up appointment scheduled in two weeks for medication reformulation and further evaluation. Attend the follow-up appointment in two weeks for medication review and reformulation.   ORDER ASSOCIATIONS  #   DIAGNOSIS / CONDITION ICD-10 ENCOUNTER ORDER     ICD-10-CM   1. Lumbar radiculopathy  M54.16     2. Injury of back, subsequent encounter  S39.92XD tiZANidine  (ZANAFLEX ) 2 MG tablet    3. Age-related osteoporosis with current  pathological fracture, sequela  M80.00XS Cholecalciferol  (VITAMIN D3) 125 MCG (5000 UT) CAPS    Multiple Vitamins-Minerals (CITRACAL +D3) TABS    4. Iron  deficiency  E61.1     5. Chronic fatigue  R53.82     6. Antinuclear antibody (ANA) positive  R76.8     7. Essential tremor  G25.0     8. Acquired hypothyroidism  E03.9       Meds ordered this encounter  Medications   tiZANidine  (ZANAFLEX ) 2 MG tablet    Sig: Take 1 tablet (2 mg total) by mouth every 6 (six) hours as needed for muscle spasms.    Dispense:  180 tablet    Refill:  4   Cholecalciferol  (VITAMIN D3) 125 MCG (5000 UT) CAPS    Sig: Take 1 capsule (5,000 Units total) by mouth daily.    Dispense:  100 capsule    Refill:  3   Multiple Vitamins-Minerals (CITRACAL +D3) TABS    Sig: Take 1 tablet by mouth daily at 6 (six)  AM.    Dispense:  90 tablet    Refill:  3      This document was synthesized by artificial intelligence (Abridge) using HIPAA-compliant recording of the clinical interaction;   We discussed the use of AI scribe software for clinical note transcription with the patient, who gave verbal consent to proceed. additional Info: This encounter employed state-of-the-art, real-time, collaborative documentation. The patient actively reviewed and assisted in updating their electronic medical record on a shared screen, ensuring transparency and facilitating joint problem-solving for the problem list, overview, and plan. This approach promotes accurate, informed care. The treatment plan was discussed and reviewed in detail, including medication safety, potential side effects, and all patient questions. We confirmed understanding and comfort with the plan. Follow-up instructions were established, including contacting the office for any concerns, returning if symptoms worsen, persist, or new symptoms develop, and precautions for potential emergency department visits.

## 2024-05-01 NOTE — Patient Instructions (Signed)
 VISIT SUMMARY:  During today's visit, we discussed your ongoing issues with fatigue, tingling sensations, and chronic back pain. We also reviewed your history of autoimmune syndrome, iron  deficiency, essential tremor, osteoporosis, and hypothyroidism. We have made some adjustments to your current treatment plan and provided recommendations for managing your symptoms.  YOUR PLAN:  -CHRONIC FATIGUE: Your persistent fatigue may be due to several factors, including your medications, low iron  levels, or an autoimmune flare. We suggest trying a break from gabapentin  to see if it helps with your fatigue. Continue taking your iron  supplements for the next 12 weeks. Discuss your fatigue with your rheumatologist at your upcoming appointment.  -IRON  DEFICIENCY: Your iron  levels are low, which can contribute to fatigue. Continue taking your iron  supplements for the next 12 weeks, and we will recheck your iron  levels after this period.  -AUTOIMMUNE SYNDROME: Your positive ANA test suggests a possible autoimmune condition, which could include diseases like lupus or Sjogren's syndrome. These conditions are generally less severe when they develop later in life. You should discuss your ANA results and potential autoimmune syndrome with your rheumatologist at your upcoming appointment.  -CHRONIC BACK PAIN WITH VERTEBRAL COMPRESSION FRACTURE: Your chronic back pain is due to a previous vertebral compression fracture. You are scheduled to see a physical medicine and rehabilitation specialist next week. If current treatments do not help, we may consider dry needling or radiofrequency ablation. Please attend your appointment next week and discuss these options if needed.  -ESSENTIAL TREMOR: Essential tremor is a condition that causes involuntary shaking. You are currently managing this with primidone . If we consider nerve ablation for your back pain, we will need to discuss the potential risks with  specialists.  -OSTEOPOROSIS: Osteoporosis is a condition that weakens bones, making them more likely to break. You are receiving Evenity  injections every 14 days and taking vitamin D3 supplements. Continue with your current treatment and ensure you are getting enough calcium  with your meals or through supplements.  -ACQUIRED HYPOTHYROIDISM: Your thyroid  condition is stable with your current medication. Continue taking levothyroxine  100 mcg daily as prescribed.  -GENERAL HEALTH MAINTENANCE: We recommend simplifying your medication regimen by consolidating vitamins and supplements into fewer pills. Ensure your multivitamin includes folic acid  and take vitamin D  with a calcium -containing meal or supplement. Consult a pharmacist for assistance.   INSTRUCTIONS:  Please attend your follow-up appointment in two weeks for medication review and reformulation. Additionally, make sure to discuss your fatigue and ANA results with your rheumatologist at your upcoming appointment. It was a pleasure seeing you today! Your health and satisfaction are our top priorities.  Bernardino Cone, MD  Your Providers PCP: Cone Bernardino MATSU, MD,  [phone]) Referring Provider: Cone Bernardino MATSU, MD,  [phone]) Care Team Provider: Donnald Charleston, MD,  [phone]) Care Team Provider: Porter Andrez JONELLE DEVONNA Care Team Provider: Kemp Coke,  [phone]) Care Team Provider: Caresse Cough, MD,  [phone]) Care Team Provider: Evonnie Asberry RAMAN, DO,  [phone]) Care Team Provider: Camella Delroy SQUIBB, MD Care Team Provider: Gerome Charleston, MD,  [phone]) Care Team Provider: Unice Pac, MD,  [phone]) Care Team Provider: Pandora Cadet, [facility] Care Team Provider: Dolphus Reiter, MD,  [phone]) Care Team Provider: Lonni Slain, MD,  [phone]) Care Team Provider: Fadeyi, Oluwatoyin Alaba, NP,  [phone]) Care Team Provider: Fadeyi, Oluwatoyin Alaba, NP,   [phone])     NEXT STEPS: [x]  Early Intervention: Schedule sooner appointment, call our on-call services, or go to emergency room if there is any significant Increase in pain  or discomfort New or worsening symptoms Sudden or severe changes in your health [x]  Flexible Follow-Up: We recommend a No follow-ups on file. for optimal routine care. This allows for progress monitoring and treatment adjustments. [x]  Preventive Care: Schedule your annual preventive care visit! It's typically covered by insurance and helps identify potential health issues early. [x]  Lab & X-ray Appointments: Incomplete tests scheduled today, or call to schedule. X-rays:  [facility] (M-F, 8:30am-noon or 1pm-5pm). [x]  Medical Information Release: Sign a release form at front desk to obtain relevant medical information we don't have.  MAKING THE MOST OF OUR FOCUSED 20 MINUTE APPOINTMENTS: [x]   Clearly state your top concerns at the beginning of the visit to focus our discussion [x]   If you anticipate you will need more time, please inform the front desk during scheduling - we can book multiple appointments in the same week. [x]   If you have transportation problems- use our convenient video appointments or ask about transportation support. [x]   We can get down to business faster if you use MyChart to update information before the visit and submit non-urgent questions before your visit. Thank you for taking the time to provide details through MyChart.  Let our nurse know and she can import this information into your encounter documents.  Arrival and Wait Times: [x]   Arriving on time ensures that everyone receives prompt attention. [x]   Early morning (8a) and afternoon (1p) appointments tend to have shortest wait times. [x]   Unfortunately, we cannot delay appointments for late arrivals or hold slots during phone calls.  Getting Answers and Following Up [x]   Simple Questions & Concerns: For quick  questions or basic follow-up after your visit, reach us  at [phone] or MyChart messaging. [x]   Complex Concerns: If your concern is more complex, scheduling an appointment might be best. Discuss this with the staff to find the most suitable option. [x]   Lab & Imaging Results: We'll contact you directly if results are abnormal or you don't use MyChart. Most normal results will be on MyChart within 2-3 business days, with a review message from Dr. Jesus. Haven't heard back in 2 weeks? Need results sooner? Contact us  at [phone]. [x]   Referrals: Our referral coordinator will manage specialist referrals. The specialist's office should contact you within 2 weeks to schedule an appointment. Call us  if you haven't heard from them after 2 weeks.  Staying Connected [x]   MyChart: Activate your MyChart for the fastest way to access results and message us . See the last page of this paperwork for instructions on how to activate.  Bring to Your Next Appointment [x]   Medications: Please bring all your medication bottles to your next appointment to ensure we have an accurate record of your prescriptions. [x]   Health Diaries: If you're monitoring any health conditions at home, keeping a diary of your readings can be very helpful for discussions at your next appointment.  Billing [x]   X-ray & Lab Orders: These are billed by separate companies. Contact the invoicing company directly for questions or concerns. [x]   Visit Charges: Discuss any billing inquiries with our administrative services team.  Your Satisfaction Matters [x]   Share Your Experience: We strive for your satisfaction! If you have any complaints, or preferably compliments, please let Dr. Jesus know directly or contact our Practice Administrators, Manuelita Rubin or [employer], by asking at the front desk.   Reviewing Your Records [x]   Review this early draft of your clinical encounter notes below and the final encounter  summary  tomorrow on MyChart after its been completed.  All orders placed so far are visible here: Lumbar radiculopathy  Injury of back, subsequent encounter -     tiZANidine  HCl; Take 1 tablet (2 mg total) by mouth every 6 (six) hours as needed for muscle spasms.  Dispense: 180 tablet; Refill: 4  Age-related osteoporosis with current pathological fracture, sequela -     Vitamin D3; Take 1 capsule (5,000 Units total) by mouth daily.  Dispense: 100 capsule; Refill: 3 -     Citracal +D3; Take 1 tablet by mouth daily at 6 (six) AM.  Dispense: 90 tablet; Refill: 3  Iron  deficiency  Chronic fatigue  Antinuclear antibody (ANA) positive  Essential tremor  Acquired hypothyroidism

## 2024-05-01 NOTE — Assessment & Plan Note (Signed)
 Thyroid  levels have been well-managed on the current levothyroxine  dose of 100 mcg daily for the past nine months. Continue the current dose of levothyroxine  100 mcg daily.

## 2024-05-01 NOTE — Assessment & Plan Note (Signed)
 She has essential tremor, which is managed with primidone . There are concerns about potential exacerbation with the nerve ablation procedure. Continue current management with primidone . Discuss potential risks of nerve ablation with specialists if considered.

## 2024-05-01 NOTE — Assessment & Plan Note (Signed)
 Positive ANA with a nuclear speckled pattern suggests a possible autoimmune syndrome such as mixed connective tissue disease, lupus, Sjogren's, dermatomyositis, or systemic sclerosis. She has Raynaud's phenomenon. Further evaluation by a rheumatologist is needed. Autoimmune syndromes that develop later in life are generally less threatening due to a weaker immune system. Discuss ANA results and potential autoimmune syndrome with a rheumatologist in the upcoming appointment.

## 2024-05-02 ENCOUNTER — Ambulatory Visit: Admitting: Physical Therapy

## 2024-05-03 ENCOUNTER — Ambulatory Visit: Admitting: Internal Medicine

## 2024-05-07 ENCOUNTER — Other Ambulatory Visit (HOSPITAL_BASED_OUTPATIENT_CLINIC_OR_DEPARTMENT_OTHER): Payer: Self-pay
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
## 2024-05-09 ENCOUNTER — Ambulatory Visit: Admitting: Physical Therapy

## 2024-05-09 ENCOUNTER — Other Ambulatory Visit (HOSPITAL_BASED_OUTPATIENT_CLINIC_OR_DEPARTMENT_OTHER): Payer: Self-pay

## 2024-05-14 ENCOUNTER — Ambulatory Visit: Admitting: Internal Medicine

## 2024-05-15 ENCOUNTER — Encounter: Payer: Self-pay | Admitting: Internal Medicine

## 2024-05-15 ENCOUNTER — Ambulatory Visit: Admitting: Internal Medicine

## 2024-05-15 VITALS — BP 110/60 | HR 64 | Temp 98.0°F | Ht 62.0 in | Wt 132.2 lb

## 2024-05-15 DIAGNOSIS — M8000XS Age-related osteoporosis with current pathological fracture, unspecified site, sequela: Secondary | ICD-10-CM | POA: Diagnosis not present

## 2024-05-15 DIAGNOSIS — R5382 Chronic fatigue, unspecified: Secondary | ICD-10-CM | POA: Diagnosis not present

## 2024-05-15 DIAGNOSIS — E039 Hypothyroidism, unspecified: Secondary | ICD-10-CM

## 2024-05-15 DIAGNOSIS — Z79899 Other long term (current) drug therapy: Secondary | ICD-10-CM

## 2024-05-15 DIAGNOSIS — Z87310 Personal history of (healed) osteoporosis fracture: Secondary | ICD-10-CM

## 2024-05-15 DIAGNOSIS — E559 Vitamin D deficiency, unspecified: Secondary | ICD-10-CM

## 2024-05-15 MED ORDER — ZOLEDRONIC ACID 5 MG/100ML IV SOLN: 5.0000 mg | Freq: Once | INTRAVENOUS | Status: AC

## 2024-05-15 NOTE — Assessment & Plan Note (Signed)
 Increase vitamin D  to 5000 ALT 10000 daily D3 since levels have hovered at 40 with 5000

## 2024-05-15 NOTE — Progress Notes (Signed)
 Referral made to the osteoporosis clinic for infusion

## 2024-05-15 NOTE — Assessment & Plan Note (Addendum)
 Today we reduced her vitamin load to just centrum and 5000 units vitamin d3, from 8 different bottles

## 2024-05-15 NOTE — Assessment & Plan Note (Signed)
 Osteoporosis with vertebral compression fracture   L1 compression fracture is currently managed with Evenity  (romosozumab ), but financial constraints require alternative treatments. Discontinue Evenity  after the current supply. Consider Reclast  (zoledronic  acid) as an alternative and arrange an infusion center appointment. Check the cost of Zometa  (zoledronic  acid) and consider it if Reclast  is unaffordable. Discuss using Evista (raloxifene) for its breast cancer risk reduction benefits, noting potential side effects like hot flashes and blood clots.

## 2024-05-15 NOTE — Assessment & Plan Note (Signed)
 Switch to reclast  from Evenity  plan due to price. Discussed evista

## 2024-05-15 NOTE — Assessment & Plan Note (Signed)
 Thyroid  levels are well-managed on levothyroxine . Recent blood work was conducted. Continue current levothyroxine  therapy and re-evaluate thyroid  function with upcoming blood work.

## 2024-05-15 NOTE — Patient Instructions (Signed)
 It was a pleasure seeing you today! Your health and satisfaction are our top priorities.  Laura Cone, MD  VISIT SUMMARY: Today, we discussed your osteoporosis treatment, vitamin supplementation, chronic fatigue, and thyroid  management. We reviewed your current medications and vitamins, and considered alternative treatments for your osteoporosis due to financial constraints. We also talked about your chronic fatigue and its potential causes, and confirmed that your thyroid  levels are stable.  YOUR PLAN: -OSTEOPOROSIS WITH VERTEBRAL COMPRESSION FRACTURE: Osteoporosis is a condition where bones become weak and are more likely to break. You are currently taking Evenity , but due to its high cost, we will discontinue it after your current supply runs out. We will consider Reclast  as an alternative and will arrange an appointment at an infusion center. If Reclast  is too expensive, we will look into Zometa . We also discussed the possibility of using Evista, which can reduce the risk of breast cancer but may have side effects like hot flashes and blood clots.  -CHRONIC FATIGUE: Chronic fatigue is a condition of persistent tiredness. You mentioned feeling a little better recently. Your thyroid  levels are stable, but weight changes and potential sleep apnea might be contributing to your fatigue. We will monitor your sleep patterns with your Apple Watch and ensure the Health app is set up for sleep apnea detection. We will also evaluate your thyroid  function with upcoming blood work and encourage you to maintain adequate protein intake to support muscle mass while on Mounjaro .  -ACQUIRED HYPOTHYROIDISM: Hypothyroidism is a condition where the thyroid  gland does not produce enough hormones. Your thyroid  levels are well-managed with levothyroxine . We will continue your current therapy and re-evaluate your thyroid  function with upcoming blood work.  INSTRUCTIONS: Please schedule an appointment at an infusion center  for Reclast . If Reclast  is too expensive, we will consider Zometa . Continue monitoring your sleep with your Apple Watch and ensure the Health app is set up for sleep apnea detection. Maintain adequate protein intake to support muscle mass while on Mounjaro . We will re-evaluate your thyroid  function with upcoming blood work.  Your Providers PCP: Farrell Laura MATSU, MD,  [phone]) Referring Provider: Cone Laura MATSU, MD,  [phone]) Care Team Provider: Donnald Charleston, MD,  [phone]) Care Team Provider: Porter Andrez JONELLE DEVONNA Care Team Provider: Kemp Coke,  [phone]) Care Team Provider: Caresse Cough, MD,  [phone]) Care Team Provider: Evonnie Asberry RAMAN, DO,  [phone]) Care Team Provider: Camella Delroy SQUIBB, MD Care Team Provider: Gerome Charleston, MD,  [phone]) Care Team Provider: Unice Pac, MD,  [phone]) Care Team Provider: Pandora Cadet, [facility] Care Team Provider: Dolphus Reiter, MD,  [phone]) Care Team Provider: Lonni Slain, MD,  [phone]) Care Team Provider: Fadeyi, Oluwatoyin Alaba, NP,  [phone]) Care Team Provider: Fadeyi, Oluwatoyin Alaba, NP,  [phone])  NEXT STEPS: [x]  Early Intervention: Schedule sooner appointment, call our on-call services, or go to emergency room if there is any significant Increase in pain or discomfort New or worsening symptoms Sudden or severe changes in your health [x]  Flexible Follow-Up: We recommend a No follow-ups on file. for optimal routine care. This allows for progress monitoring and treatment adjustments. [x]  Preventive Care: Schedule your annual preventive care visit! It's typically covered by insurance and helps identify potential health issues early. [x]  Lab & X-ray Appointments: Incomplete tests scheduled today, or call to schedule. X-rays: [facility] (M-F, 8:30am-noon or 1pm-5pm). [x]  Medical Information Release: Sign a release form at front  desk to obtain relevant medical information we don't have.  MAKING  THE MOST OF OUR FOCUSED 20 MINUTE APPOINTMENTS: [x]   Clearly state your top concerns at the beginning of the visit to focus our discussion [x]   If you anticipate you will need more time, please inform the front desk during scheduling - we can book multiple appointments in the same week. [x]   If you have transportation problems- use our convenient video appointments or ask about transportation support. [x]   We can get down to business faster if you use MyChart to update information before the visit and submit non-urgent questions before your visit. Thank you for taking the time to provide details through MyChart.  Let our nurse know and she can import this information into your encounter documents.  Arrival and Wait Times: [x]   Arriving on time ensures that everyone receives prompt attention. [x]   Early morning (8a) and afternoon (1p) appointments tend to have shortest wait times. [x]   Unfortunately, we cannot delay appointments for late arrivals or hold slots during phone calls.  Getting Answers and Following Up [x]   Simple Questions & Concerns: For quick questions or basic follow-up after your visit, reach us  at [phone] or MyChart messaging. [x]   Complex Concerns: If your concern is more complex, scheduling an appointment might be best. Discuss this with the staff to find the most suitable option. [x]   Lab & Imaging Results: We'll contact you directly if results are abnormal or you don't use MyChart. Most normal results will be on MyChart within 2-3 business days, with a review message from Dr. Jesus. Haven't heard back in 2 weeks? Need results sooner? Contact us  at [phone]. [x]   Referrals: Our referral coordinator will manage specialist referrals. The specialist's office should contact you within 2 weeks to schedule an appointment. Call us  if you haven't heard from them after 2 weeks.  Staying Connected [x]    MyChart: Activate your MyChart for the fastest way to access results and message us . See the last page of this paperwork for instructions on how to activate.  Bring to Your Next Appointment [x]   Medications: Please bring all your medication bottles to your next appointment to ensure we have an accurate record of your prescriptions. [x]   Health Diaries: If you're monitoring any health conditions at home, keeping a diary of your readings can be very helpful for discussions at your next appointment.  Billing [x]   X-ray & Lab Orders: These are billed by separate companies. Contact the invoicing company directly for questions or concerns. [x]   Visit Charges: Discuss any billing inquiries with our administrative services team.  Your Satisfaction Matters [x]   Share Your Experience: We strive for your satisfaction! If you have any complaints, or preferably compliments, please let Dr. Jesus know directly or contact our Practice Administrators, Manuelita Rubin or [employer], by asking at the front desk.   Reviewing Your Records [x]   Review this early draft of your clinical encounter notes below and the final encounter summary tomorrow on MyChart after its been completed.  All orders placed so far are visible here: Vitamin D  deficiency Assessment & Plan: Increase vitamin D  to 5000 ALT 89999 daily D3 since levels have hovered at 40 with 5000  Orders: -     Amb Referral to Osteoporosis Management   Chronic fatigue Assessment & Plan: Chronic fatigue shows some improvement. Thyroid  levels are well-managed. Weight fluctuations and potential sleep apnea contribute to fatigue. Mounjaro  may affect muscle mass and nutrition. Monitor sleep patterns with the Apple Watch and ensure the Health app is set up for  sleep apnea detection. Evaluate thyroid  function with upcoming blood work. Encourage adequate protein intake to maintain muscle mass while on Mounjaro .  Orders: -     Amb Referral to Osteoporosis  Management   H/O healed fragility fracture -     Zoledronic  Acid  Age-related osteoporosis with current pathological fracture, sequela Assessment & Plan: Osteoporosis with vertebral compression fracture   L1 compression fracture is currently managed with Evenity  (romosozumab ), but financial constraints require alternative treatments. Discontinue Evenity  after the current supply. Consider Reclast  (zoledronic  acid) as an alternative and arrange an infusion center appointment. Check the cost of Zometa  (zoledronic  acid) and consider it if Reclast  is unaffordable. Discuss using Evista (raloxifene) for its breast cancer risk reduction benefits, noting potential side effects like hot flashes and blood clots.  Orders: -     Amb Referral to Osteoporosis Management   Polypharmacy Assessment & Plan: Today we reduced her vitamin load to just centrum and 5000 units vitamin d3, from 8 different bottles   Acquired hypothyroidism

## 2024-05-15 NOTE — Assessment & Plan Note (Signed)
 Chronic fatigue shows some improvement. Thyroid  levels are well-managed. Weight fluctuations and potential sleep apnea contribute to fatigue. Mounjaro  may affect muscle mass and nutrition. Monitor sleep patterns with the Apple Watch and ensure the Health app is set up for sleep apnea detection. Evaluate thyroid  function with upcoming blood work. Encourage adequate protein intake to maintain muscle mass while on Mounjaro .

## 2024-05-15 NOTE — Progress Notes (Signed)
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  NEGATIVE Final   Bilirubin Urine 10/17/2023 NEGATIVE  NEGATIVE Final   Ketones, ur 10/17/2023 TRACE (A)  NEGATIVE Final   Hgb urine dipstick 10/17/2023 NEGATIVE  NEGATIVE Final   Protein, ur 10/17/2023 TRACE (A)  NEGATIVE Final   Nitrite 10/17/2023 NEGATIVE  NEGATIVE Final   Leukocytes,Ua 10/17/2023 3+ (A)  NEGATIVE Final   WBC, UA 10/17/2023 > OR = 60 (A)  0 - 5 /HPF Final   RBC / HPF 10/17/2023 0-2  0 - 2 /HPF Final   Squamous Epithelial / HPF 10/17/2023 0-5  < OR = 5 /HPF Final   Bacteria, UA 10/17/2023 MODERATE (A)  NONE SEEN /HPF Final   Hyaline Cast 10/17/2023 NONE SEEN  NONE SEEN /LPF Final  There may be more visits with results that are not included.  No image results found. CT CARDIAC SCORING (SELF PAY ONLY) Addendum Date: 04/05/2024 ADDENDUM REPORT: 04/05/2024 14:25 EXAM: OVER-READ INTERPRETATION  CT CHEST The following report is an over-read performed by radiologist Dr. Andrea Gasman of [facility] on  04/05/2024. This over-read does not include interpretation of cardiac or coronary anatomy or pathology. The coronary calcium  score interpretation by the cardiologist is attached. COMPARISON:  None. FINDINGS: Vascular: Minor aortic atherosclerosis. The included aorta is normal in caliber. Mediastinum/nodes: No adenopathy or mass. Small to moderate-sized hiatal hernia Lungs: No focal airspace disease. No pulmonary nodule. No pleural fluid. The included airways are patent. Upper abdomen: No acute or unexpected findings. Musculoskeletal: There are no acute or suspicious osseous abnormalities. IMPRESSION: Small to moderate-sized hiatal hernia. Aortic  Atherosclerosis (ICD10-I70.0). Electronically Signed   By: Andrea Gasman M.D.   On: 04/05/2024 14:25   Result Date: 04/05/2024 CLINICAL DATA:  Risk stratification: 78 Year-old Female EXAM: Coronary Calcium  Score TECHNIQUE: A gated, non-contrast computed tomography scan of the heart was performed using 2.5 mm slice thickness. Axial images were analyzed on a dedicated workstation. Calcium  scoring of the coronary arteries was performed using the Agatston method. FINDINGS: Coronary Calcium  Score: Left main: 0 Left anterior descending artery: 43 Left circumflex artery: 0 Right coronary artery: 19 Total: 62 Percentile: 40th Ascending Aorta: Normal caliber. Aortic atherosclerosis. Aortic Valve Calcium  score: 0 Mitral annular calcification: 0 Pericardium: Normal. Non-cardiac: See separate report from [facility]. IMPRESSION: 1. Coronary calcium  score of 62. This was 40th percentile for age-, race-, and sex-matched controls. 2. Aortic atherosclerosis. RECOMMENDATIONS: Coronary artery calcium  (CAC) score is a strong predictor of incident coronary heart disease (CHD) and provides predictive information beyond traditional risk factors. CAC scoring is reasonable to use in the decision to withhold, postpone, or initiate statin therapy in intermediate-risk or selected  borderline-risk asymptomatic adults (age 38-75 years and LDL-C >=70 to <190 mg/dL) who do not have diabetes or established atherosclerotic cardiovascular disease (ASCVD).* In intermediate-risk (10-year ASCVD risk >=7.5% to <20%) adults or selected borderline-risk (10-year ASCVD risk >=5% to <7.5%) adults in whom a CAC score is measured for the purpose of making a treatment decision the following recommendations have been made: If CAC=0, it is reasonable to withhold statin therapy and reassess in 5 to 10 years, as long as higher risk conditions are absent (diabetes mellitus, family history of premature CHD in first degree relatives (males <55 years; females <65 years), cigarette smoking, or LDL >=190 mg/dL). If CAC is 1 to 99, it is reasonable to initiate statin therapy for patients >=52 years of age. If CAC is >=100 or >=75th percentile, it is reasonable to initiate statin therapy at any age. Cardiology referral should be considered for patients with CAC scores >=400 or >=75th percentile. *2018 AHA/ACC/AACVPR/AAPA/ABC/ACPM/ADA/AGS/APhA/ASPC/NLA/PCNA Guideline on the Management of Blood Cholesterol: A Report of the American College of Cardiology/American Heart Association Task Force on Clinical Practice Guidelines. J Am Coll Cardiol. 2019;73(24):3168-3209. Stanly Leavens, MD Electronically Signed: By: Stanly Leavens M.D. On: 03/21/2024 21:37         ASSESSMENT & PLAN   Assessment & Plan Vitamin D  deficiency Increase vitamin D  to 5000 ALT 89999 daily D3 since levels have hovered at 40 with 5000 Chronic fatigue Chronic fatigue shows some improvement. Thyroid  levels are well-managed. Weight fluctuations and potential sleep apnea contribute to fatigue. Mounjaro  may affect muscle mass and nutrition. Monitor sleep patterns with the Apple Watch and ensure the Health app is set up for sleep apnea detection. Evaluate thyroid  function with upcoming blood work. Encourage adequate protein intake to  maintain muscle mass while on Mounjaro . H/O healed fragility fracture Switch to reclast  from Evenity  plan due to price. Discussed evista Age-related osteoporosis with current pathological fracture, sequela Osteoporosis with vertebral compression fracture   L1 compression fracture is currently managed with Evenity  (romosozumab ), but financial constraints require alternative treatments. Discontinue Evenity  after the current supply. Consider Reclast  (zoledronic  acid) as an alternative and arrange an infusion center appointment. Check the cost of Zometa  (zoledronic  acid) and consider it if Reclast  is unaffordable. Discuss using Evista (raloxifene) for its breast cancer risk reduction benefits, noting potential side effects like hot flashes and blood clots. Polypharmacy Today we reduced her vitamin load to just centrum and 5000 units  vitamin d3, from 8 different bottles Acquired hypothyroidism Thyroid  levels are well-managed on levothyroxine . Recent blood work was conducted. Continue current levothyroxine  therapy and re-evaluate thyroid  function with upcoming blood work.     ORDER ASSOCIATIONS  #   DIAGNOSIS / CONDITION ICD-10 ENCOUNTER ORDER     ICD-10-CM   1. Vitamin D  deficiency  E55.9 Amb Referral to Osteoporosis Management     2. Chronic fatigue  R53.82 Amb Referral to Osteoporosis Management     3. H/O healed fragility fracture  Z87.310 zoledronic  acid (RECLAST ) injection 5 mg    4. Age-related osteoporosis with current pathological fracture, sequela  M80.00XS Amb Referral to Osteoporosis Management     5. Polypharmacy  Z79.899     6. Acquired hypothyroidism  E03.9       ZOLEDRONIC  ACID 5 MG/100ML IV SOLN   Once         Amb Referral to Osteoporosis Management        Comments: Pcp is requestion reclast  infusion  osteoporsis clinic [address]         This document was synthesized by artificial intelligence (Abridge) using HIPAA-compliant recording of the clinical  interaction;   We discussed the use of AI scribe software for clinical note transcription with the patient, who gave verbal consent to proceed. additional Info: This encounter employed state-of-the-art, real-time, collaborative documentation. The patient actively reviewed and assisted in updating their electronic medical record on a shared screen, ensuring transparency and facilitating joint problem-solving for the problem list, overview, and plan. This approach promotes accurate, informed care. The treatment plan was discussed and reviewed in detail, including medication safety, potential side effects, and all patient questions. We confirmed understanding and comfort with the plan. Follow-up instructions were established, including contacting the office for any concerns, returning if symptoms worsen, persist, or new symptoms develop, and precautions for potential emergency department visits.

## 2024-05-21 ENCOUNTER — Other Ambulatory Visit (HOSPITAL_BASED_OUTPATIENT_CLINIC_OR_DEPARTMENT_OTHER): Payer: Self-pay

## 2024-05-25 ENCOUNTER — Other Ambulatory Visit (HOSPITAL_BASED_OUTPATIENT_CLINIC_OR_DEPARTMENT_OTHER): Payer: Self-pay

## 2024-05-25 ENCOUNTER — Ambulatory Visit: Admitting: Internal Medicine

## 2024-05-28 ENCOUNTER — Ambulatory Visit: Admitting: Internal Medicine

## 2024-06-04 DIAGNOSIS — M546 Pain in thoracic spine: Secondary | ICD-10-CM | POA: Diagnosis not present

## 2024-06-06 ENCOUNTER — Other Ambulatory Visit (HOSPITAL_BASED_OUTPATIENT_CLINIC_OR_DEPARTMENT_OTHER): Payer: Self-pay

## 2024-06-08 ENCOUNTER — Ambulatory Visit (INDEPENDENT_AMBULATORY_CARE_PROVIDER_SITE_OTHER): Admitting: Internal Medicine

## 2024-06-08 ENCOUNTER — Encounter: Payer: Self-pay | Admitting: Internal Medicine

## 2024-06-08 VITALS — BP 100/60 | HR 65 | Temp 97.8°F | Ht 62.0 in | Wt 134.4 lb

## 2024-06-08 DIAGNOSIS — E039 Hypothyroidism, unspecified: Secondary | ICD-10-CM

## 2024-06-08 DIAGNOSIS — I7300 Raynaud's syndrome without gangrene: Secondary | ICD-10-CM | POA: Diagnosis not present

## 2024-06-08 DIAGNOSIS — G250 Essential tremor: Secondary | ICD-10-CM | POA: Diagnosis not present

## 2024-06-08 DIAGNOSIS — Z87310 Personal history of (healed) osteoporosis fracture: Secondary | ICD-10-CM

## 2024-06-08 DIAGNOSIS — H00015 Hordeolum externum left lower eyelid: Secondary | ICD-10-CM

## 2024-06-08 DIAGNOSIS — E782 Mixed hyperlipidemia: Secondary | ICD-10-CM | POA: Diagnosis not present

## 2024-06-08 DIAGNOSIS — N951 Menopausal and female climacteric states: Secondary | ICD-10-CM

## 2024-06-08 NOTE — Progress Notes (Signed)
 Looks like letter was mailed and I sent message to pt with the numer to call

## 2024-06-08 NOTE — Patient Instructions (Addendum)
 It was a pleasure seeing you today! Your health and satisfaction are our top priorities.  Laura Cone, MD  VISIT SUMMARY: During your visit, we discussed several health concerns including a stye in your eye, chronic back pain, osteoporosis, hypothyroidism, Raynaud's phenomenon, high cholesterol, essential tremor, and depression. We reviewed your current medications and made some adjustments to your treatment plan.  YOUR PLAN: -STYE OF EYELID (HORDEOLUM): A stye is a small, painful lump on the eyelid caused by a blocked gland. Apply warm compresses to the affected eyelid and avoid trying to drain it at home. If the stye does not improve or gets worse, we may need to refer you to an eye specialist.  -CHRONIC PAIN DUE TO VERTEBRAL FRACTURE AT L1: Chronic pain from a broken bone in your lower back can be worsened by physical activity. Continue taking tramadol  as needed for severe pain and stay active, including gardening. You are scheduled for a back injection on September 9th, and if this does not help, we may consider a procedure to block the nerves causing the pain.  -OSTEOPOROSIS WITH HISTORY OF FRAGILITY FRACTURE: Osteoporosis is a condition where bones become weak and are more likely to break. We need to verify your current medications and arrange for Reclast  infusions to strengthen your bones. Continue taking vitamin D  supplements.  -ACQUIRED HYPOTHYROIDISM: Hypothyroidism is when your thyroid  gland does not produce enough hormones. Continue taking levothyroxine  as prescribed and plan to have a thyroid  function test in a few months, or sooner if you notice any symptoms.  -RAYNAUD'S PHENOMENON: Raynaud's phenomenon is a condition where blood flow to your fingers and toes is reduced, usually triggered by cold or stress. Continue taking nifedipine  as needed and consult with a rheumatologist before making any changes to your medication.  -HYPERLIPIDEMIA: Hyperlipidemia means you have high levels of  fats in your blood, which can increase the risk of stroke. Continue taking rosuvastatin  and Repatha  as prescribed and try to include more extra virgin olive oil and avocados in your diet.  -ESSENTIAL TREMOR: Essential tremor is a nervous system disorder that causes involuntary shaking. Continue taking primidone  and propranolol  as prescribed.  -DEPRESSION: Depression is a mood disorder that causes persistent feelings of sadness and loss of interest. Continue taking venlafaxine  and consider reducing the dose to 37.5 mg if possible. Monitor for any withdrawal symptoms or hot flashes.  INSTRUCTIONS: Please follow up with the scheduled back injection on September 9th. If your stye does not improve or worsens, contact us  for a possible referral to an eye specialist. Schedule a thyroid  function test in a few months or sooner if you experience symptoms. Consult with a rheumatologist before making any changes to your nifedipine  regimen.  Your Providers PCP: Farrell Laura MATSU, MD,  [phone]) Referring Provider: Cone Laura MATSU, MD,  [phone]) Care Team Provider: Donnald Charleston, MD,  [phone]) Care Team Provider: Porter Andrez SAUNDERS, PA-C Care Team Provider: Kemp Coke,  [phone]) Care Team Provider: Caresse Cough, MD,  [phone]) Care Team Provider: Evonnie Asberry RAMAN, DO,  [phone]) Care Team Provider: Camella Delroy SQUIBB, MD Care Team Provider: Gerome Charleston, MD,  [phone]) Care Team Provider: Unice Pac, MD,  [phone]) Care Team Provider: Pandora Cadet, [facility] Care Team Provider: Dolphus Reiter, MD,  [phone]) Care Team Provider: Lonni Slain, MD,  [phone]) Care Team Provider: Fadeyi, Oluwatoyin Alaba, NP,  [phone]) Care Team Provider: Fadeyi, Oluwatoyin Alaba, NP,  [phone])  NEXT STEPS: [x]  Early Intervention: Schedule sooner appointment, call our on-call services, or go to  emergency room if there is any  significant Increase in pain or discomfort New or worsening symptoms Sudden or severe changes in your health [x]  Flexible Follow-Up: We recommend a Return if symptoms worsen or fail to improve. for optimal routine care. This allows for progress monitoring and treatment adjustments. [x]  Preventive Care: Schedule your annual preventive care visit! It's typically covered by insurance and helps identify potential health issues early. [x]  Lab & X-ray Appointments: Incomplete tests scheduled today, or call to schedule. X-rays: [facility] (M-F, 8:30am-noon or 1pm-5pm). [x]  Medical Information Release: Sign a release form at front desk to obtain relevant medical information we don't have.  MAKING THE MOST OF OUR FOCUSED 20 MINUTE APPOINTMENTS: [x]   Clearly state your top concerns at the beginning of the visit to focus our discussion [x]   If you anticipate you will need more time, please inform the front desk during scheduling - we can book multiple appointments in the same week. [x]   If you have transportation problems- use our convenient video appointments or ask about transportation support. [x]   We can get down to business faster if you use MyChart to update information before the visit and submit non-urgent questions before your visit. Thank you for taking the time to provide details through MyChart.  Let our nurse know and she can import this information into your encounter documents.  Arrival and Wait Times: [x]   Arriving on time ensures that everyone receives prompt attention. [x]   Early morning (8a) and afternoon (1p) appointments tend to have shortest wait times. [x]   Unfortunately, we cannot delay appointments for late arrivals or hold slots during phone calls.  Getting Answers and Following Up [x]   Simple Questions & Concerns: For quick questions or basic follow-up after your visit, reach us  at [phone] or MyChart messaging. [x]   Complex Concerns: If your concern is  more complex, scheduling an appointment might be best. Discuss this with the staff to find the most suitable option. [x]   Lab & Imaging Results: We'll contact you directly if results are abnormal or you don't use MyChart. Most normal results will be on MyChart within 2-3 business days, with a review message from Dr. Jesus. Haven't heard back in 2 weeks? Need results sooner? Contact us  at [phone]. [x]   Referrals: Our referral coordinator will manage specialist referrals. The specialist's office should contact you within 2 weeks to schedule an appointment. Call us  if you haven't heard from them after 2 weeks.  Staying Connected [x]   MyChart: Activate your MyChart for the fastest way to access results and message us . See the last page of this paperwork for instructions on how to activate.  Bring to Your Next Appointment [x]   Medications: Please bring all your medication bottles to your next appointment to ensure we have an accurate record of your prescriptions. [x]   Health Diaries: If you're monitoring any health conditions at home, keeping a diary of your readings can be very helpful for discussions at your next appointment.  Billing [x]   X-ray & Lab Orders: These are billed by separate companies. Contact the invoicing company directly for questions or concerns. [x]   Visit Charges: Discuss any billing inquiries with our administrative services team.  Your Satisfaction Matters [x]   Share Your Experience: We strive for your satisfaction! If you have any complaints, or preferably compliments, please let Dr. Jesus know directly or contact our Practice Administrators, Manuelita Rubin or [employer], by asking at the front desk.   Reviewing Your Records [x]   Review this early draft of your clinical encounter notes below and the final encounter summary tomorrow on MyChart after its been completed.  All orders placed so far are visible here: Hordeolum externum of left lower eyelid  H/O  healed fragility fracture  Acquired hypothyroidism  Raynaud's disease without gangrene  Mixed hyperlipidemia  Essential tremor  Menopausal hot flushes       Managing a Stye (Hordeolum): Patient Information What is a Stye? A stye is a red, painful bump on the eyelid, usually caused by a bacterial infection of an eyelash follicle or oil gland. It can occur on the outside (external stye) or inside (internal stye) of the eyelid.  Symptoms: Red, swollen bump on the eyelid (may look like a pimple) Tenderness or pain Swelling of the eyelid Tearing or mild discharge Sensation of a foreign body in the eye  Home Care Instructions: Warm Compresses Apply a clean, warm (not hot) washcloth to the affected eyelid for 10-15 minutes, 3-4 times daily. This helps the stye drain naturally and relieves discomfort. Keep Eyelids Clean Gently wash your hands before touching your eyes. Clean the eyelid with a mild, tear-free baby shampoo diluted with water, or use commercial eyelid wipes. Avoid rubbing or squeezing the stye. Avoid Makeup & Contact Lenses Do not use eye makeup or wear contact lenses until the stye has healed. This prevents further irritation and spread of infection. Pain Relief Over-the-counter pain relievers (acetaminophen  or ibuprofen) can help with discomfort. Do not apply topical antibiotics or ointments unless prescribed by your healthcare provider. Do Not Squeeze or Pop the Stye Squeezing may spread infection and worsen inflammation.  When to Seek Medical Attention: The stye does not improve after 1 week of home care. The swelling and redness spread beyond the eyelid. Vision changes, severe pain, or fever develop. Recurrent styes or multiple styes occur. The stye grows very large or does not drain.  Prevention Tips: Wash hands frequently and avoid touching eyes. Remove makeup before sleeping. Replace eye makeup regularly (every 3-6 months). Clean contact lenses as  directed.  Possible Medical Treatments: If the stye persists or worsens, your healthcare provider may prescribe:  Topical or oral antibiotics Incision and drainage (rare, for large or persistent styes) Referral to an ophthalmologist  Follow-Up: If you have frequent styes, discuss with your provider for further evaluation of underlying conditions (such as blepharitis or chronic eyelid inflammation).  Contact Your Provider If: You develop fever, vision loss, or severe swelling. The stye does not resolve within 7-10 days.  Questions? If you have concerns or notice new symptoms, contact your healthcare provider for advice.  This handout is for educational purposes and should not replace professional medical advice.

## 2024-06-08 NOTE — Progress Notes (Signed)
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   Bacteria, UA 10/17/2023 MODERATE (A)  NONE SEEN /HPF Final   Hyaline Cast 10/17/2023 NONE SEEN  NONE SEEN /LPF Final  There may be more visits with results that are not included.  No image results found. CT CARDIAC SCORING (SELF PAY ONLY) Addendum Date: 04/05/2024 ADDENDUM REPORT: 04/05/2024 14:25 EXAM: OVER-READ INTERPRETATION  CT CHEST The following report is an over-read performed by radiologist Dr. Andrea Gasman of [facility] on 04/05/2024. This over-read does not include interpretation of cardiac or coronary anatomy or pathology. The coronary calcium  score interpretation by the cardiologist is attached. COMPARISON:  None. FINDINGS: Vascular: Minor aortic atherosclerosis. The included aorta is normal in caliber. Mediastinum/nodes: No adenopathy or mass. Small to moderate-sized hiatal hernia Lungs: No focal airspace disease. No pulmonary nodule. No pleural fluid. The included airways are patent. Upper abdomen: No acute or unexpected findings. Musculoskeletal: There are no acute or suspicious  osseous abnormalities. IMPRESSION: Small to moderate-sized hiatal hernia. Aortic Atherosclerosis (ICD10-I70.0). Electronically Signed   By: Andrea Gasman M.D.   On: 04/05/2024 14:25   Result Date: 04/05/2024 CLINICAL DATA:  Risk stratification: 78 Year-old Female EXAM: Coronary Calcium  Score TECHNIQUE: A gated, non-contrast computed tomography scan of the heart was performed using 2.5 mm slice thickness. Axial images were analyzed on a dedicated workstation. Calcium  scoring of the coronary arteries was performed using the Agatston method. FINDINGS: Coronary Calcium  Score: Left main: 0  Left anterior descending artery: 43 Left circumflex artery: 0 Right coronary artery: 19 Total: 62 Percentile: 40th Ascending Aorta: Normal caliber. Aortic atherosclerosis. Aortic Valve Calcium  score: 0 Mitral annular calcification: 0 Pericardium: Normal. Non-cardiac: See separate report from [facility]. IMPRESSION: 1. Coronary calcium  score of 62. This was 40th percentile for age-, race-, and sex-matched controls. 2. Aortic atherosclerosis. RECOMMENDATIONS: Coronary artery calcium  (CAC) score is a strong predictor of incident coronary heart disease (CHD) and provides predictive information beyond traditional risk factors. CAC scoring is reasonable to use in the decision to withhold, postpone, or initiate statin therapy in intermediate-risk or selected borderline-risk asymptomatic adults (age 41-75 years and LDL-C >=70 to <190 mg/dL) who do not have diabetes or established atherosclerotic cardiovascular disease (ASCVD).* In intermediate-risk (10-year ASCVD risk >=7.5% to <20%) adults or selected borderline-risk (10-year ASCVD risk >=5% to <7.5%) adults in whom a CAC score is measured for the purpose of making a treatment decision the following recommendations have been made: If CAC=0, it is reasonable to withhold statin therapy and reassess in 5 to 10 years, as long as higher risk conditions are absent (diabetes mellitus,  family history of premature CHD in first degree relatives (males <55 years; females <65 years), cigarette smoking, or LDL >=190 mg/dL). If CAC is 1 to 99, it is reasonable to initiate statin therapy for patients >=63 years of age. If CAC is >=100 or >=75th percentile, it is reasonable to initiate statin therapy at any age. Cardiology referral should be considered for patients with CAC scores >=400 or >=75th percentile. *2018 AHA/ACC/AACVPR/AAPA/ABC/ACPM/ADA/AGS/APhA/ASPC/NLA/PCNA Guideline on the Management of Blood Cholesterol: A Report of the American College of Cardiology/American Heart Association Task Force on Clinical Practice Guidelines. J Am Coll Cardiol. 2019;73(24):3168-3209. Stanly Leavens, MD Electronically Signed: By: Stanly Leavens M.D. On: 03/21/2024 21:37         ASSESSMENT & PLAN   Assessment & Plan Hordeolum externum of left lower eyelid Stye of eyelid (hordeolum)   A stye on her eyelid is present. Her history of corneal implant raises concern for eye health. Her husband attempted to lance the stye prematurely, resulting in two styes. Apply warm compresses to the affected eyelid and avoid further attempts to lance the stye at home. Consider ophthalmology referral if the stye does not resolve or worsens. H/O healed fragility fracture Osteoporosis with history of fragility fracture   Osteoporosis management is complicated by confusion regarding her medication regimen. She is currently on Repatha  for cholesterol, not Reclast  for osteoporosis. Reclast  infusions need to be facilitated. Verify her current osteoporosis medication regimen, facilitate Reclast  infusion, and ensure continued vitamin D  supplementation.  Chronic pain due to vertebral fracture at L1   Chronic pain from a vertebral fracture at L1 is exacerbated by physical activity like gardening. Activity is encouraged to maintain mobility and bone strength. She is scheduled for a back injection on September 9th  and may consider nerve ablation if injections are ineffective. Continue tramadol  as needed for severe pain and encourage physical activity, including gardening. Proceed with the scheduled back injection and consider nerve ablation if injections are not effective.  Acquired hypothyroidism Acquired hypothyroidism   Acquired hypothyroidism is managed with levothyroxine . She is due for a thyroid  function test but can wait a few more months if asymptomatic. Continue levothyroxine  as prescribed and schedule a thyroid  function test in a few months or sooner if symptoms arise. Raynaud's disease without gangrene Raynaud's phenomenon is well-controlled with nifedipine . She is advised to use it as needed  based on symptom severity. Consult with a rheumatologist before making changes to the nifedipine  regimen. Continue nifedipine  as needed and consult with a rheumatologist before making changes. Mixed hyperlipidemia Hyperlipidemia is managed with rosuvastatin  and Repatha , with an emphasis on stroke prevention. Dietary modifications including extra virgin olive oil and avocados are recommended. Continue rosuvastatin  and Repatha  as prescribed and incorporate dietary modifications. Essential tremor Essential tremor is managed with primidone  and propranolol . Attempts to discontinue have been unsuccessful due to symptom severity. Continue primidone  and propranolol  as prescribed. Menopausal hot flushes is managed with venlafaxine . Attempts to taper off resulted in withdrawal symptoms and hot flashes. Consider reducing the venlafaxine  dose to 37.5 mg if feasible and monitor for withdrawal symptoms and hot flashes.       This document was synthesized by artificial intelligence (Abridge) using HIPAA-compliant recording of the clinical interaction;   We discussed the use of AI scribe software for clinical note transcription with the patient, who gave verbal consent to proceed. additional Info: This encounter employed  state-of-the-art, real-time, collaborative documentation. The patient actively reviewed and assisted in updating their electronic medical record on a shared screen, ensuring transparency and facilitating joint problem-solving for the problem list, overview, and plan. This approach promotes accurate, informed care. The treatment plan was discussed and reviewed in detail, including medication safety, potential side effects, and all patient questions. We confirmed understanding and comfort with the plan. Follow-up instructions were established, including contacting the office for any concerns, returning if symptoms worsen, persist, or new symptoms develop, and precautions for potential emergency department visits.

## 2024-06-09 NOTE — Assessment & Plan Note (Signed)
 Essential tremor is managed with primidone  and propranolol . Attempts to discontinue have been unsuccessful due to symptom severity. Continue primidone  and propranolol  as prescribed.

## 2024-06-09 NOTE — Assessment & Plan Note (Signed)
 Raynaud's phenomenon is well-controlled with nifedipine . She is advised to use it as needed based on symptom severity. Consult with a rheumatologist before making changes to the nifedipine  regimen. Continue nifedipine  as needed and consult with a rheumatologist before making changes.

## 2024-06-09 NOTE — Assessment & Plan Note (Signed)
 Acquired hypothyroidism   Acquired hypothyroidism is managed with levothyroxine . She is due for a thyroid  function test but can wait a few more months if asymptomatic. Continue levothyroxine  as prescribed and schedule a thyroid  function test in a few months or sooner if symptoms arise.

## 2024-06-09 NOTE — Assessment & Plan Note (Signed)
 Osteoporosis with history of fragility fracture   Osteoporosis management is complicated by confusion regarding her medication regimen. She is currently on Repatha  for cholesterol, not Reclast  for osteoporosis. Reclast  infusions need to be facilitated. Verify her current osteoporosis medication regimen, facilitate Reclast  infusion, and ensure continued vitamin D  supplementation.  Chronic pain due to vertebral fracture at L1   Chronic pain from a vertebral fracture at L1 is exacerbated by physical activity like gardening. Activity is encouraged to maintain mobility and bone strength. She is scheduled for a back injection on September 9th and may consider nerve ablation if injections are ineffective. Continue tramadol  as needed for severe pain and encourage physical activity, including gardening. Proceed with the scheduled back injection and consider nerve ablation if injections are not effective.

## 2024-06-09 NOTE — Assessment & Plan Note (Signed)
 Hyperlipidemia is managed with rosuvastatin  and Repatha , with an emphasis on stroke prevention. Dietary modifications including extra virgin olive oil and avocados are recommended. Continue rosuvastatin  and Repatha  as prescribed and incorporate dietary modifications.

## 2024-06-09 NOTE — Assessment & Plan Note (Signed)
 is managed with venlafaxine . Attempts to taper off resulted in withdrawal symptoms and hot flashes. Consider reducing the venlafaxine  dose to 37.5 mg if feasible and monitor for withdrawal symptoms and hot flashes.

## 2024-06-19 DIAGNOSIS — M5414 Radiculopathy, thoracic region: Secondary | ICD-10-CM | POA: Diagnosis not present

## 2024-06-21 ENCOUNTER — Ambulatory Visit: Admitting: Internal Medicine

## 2024-06-29 ENCOUNTER — Ambulatory Visit: Admitting: Internal Medicine

## 2024-07-02 ENCOUNTER — Other Ambulatory Visit: Payer: Self-pay | Admitting: Internal Medicine

## 2024-07-02 DIAGNOSIS — E7800 Pure hypercholesterolemia, unspecified: Secondary | ICD-10-CM

## 2024-07-03 ENCOUNTER — Other Ambulatory Visit (HOSPITAL_BASED_OUTPATIENT_CLINIC_OR_DEPARTMENT_OTHER): Payer: Self-pay

## 2024-07-03 MED ORDER — REPATHA SURECLICK 140 MG/ML ~~LOC~~ SOAJ
140.0000 mg | SUBCUTANEOUS | 2 refills | Status: DC
  Filled 2024-07-03: qty 2, 28d supply, fill #0
  Filled 2024-08-05: qty 2, 28d supply, fill #1
  Filled 2024-08-29: qty 2, 28d supply, fill #2

## 2024-07-06 ENCOUNTER — Ambulatory Visit: Admitting: Internal Medicine

## 2024-07-09 ENCOUNTER — Ambulatory Visit: Admitting: Internal Medicine

## 2024-07-09 ENCOUNTER — Other Ambulatory Visit (HOSPITAL_BASED_OUTPATIENT_CLINIC_OR_DEPARTMENT_OTHER): Payer: Self-pay

## 2024-07-10 DIAGNOSIS — L820 Inflamed seborrheic keratosis: Secondary | ICD-10-CM | POA: Diagnosis not present

## 2024-07-10 DIAGNOSIS — L821 Other seborrheic keratosis: Secondary | ICD-10-CM | POA: Diagnosis not present

## 2024-07-10 DIAGNOSIS — L570 Actinic keratosis: Secondary | ICD-10-CM | POA: Diagnosis not present

## 2024-07-10 DIAGNOSIS — L538 Other specified erythematous conditions: Secondary | ICD-10-CM | POA: Diagnosis not present

## 2024-07-11 DIAGNOSIS — H43811 Vitreous degeneration, right eye: Secondary | ICD-10-CM | POA: Diagnosis not present

## 2024-07-11 DIAGNOSIS — D23122 Other benign neoplasm of skin of left lower eyelid, including canthus: Secondary | ICD-10-CM | POA: Diagnosis not present

## 2024-07-11 DIAGNOSIS — Z961 Presence of intraocular lens: Secondary | ICD-10-CM | POA: Diagnosis not present

## 2024-07-11 DIAGNOSIS — Z947 Corneal transplant status: Secondary | ICD-10-CM | POA: Diagnosis not present

## 2024-07-13 DIAGNOSIS — N3946 Mixed incontinence: Secondary | ICD-10-CM | POA: Diagnosis not present

## 2024-07-13 DIAGNOSIS — N390 Urinary tract infection, site not specified: Secondary | ICD-10-CM | POA: Diagnosis not present

## 2024-07-17 ENCOUNTER — Encounter: Payer: Self-pay | Admitting: Internal Medicine

## 2024-07-17 ENCOUNTER — Ambulatory Visit: Admitting: Internal Medicine

## 2024-07-17 VITALS — BP 100/60 | HR 80 | Temp 98.3°F | Ht 62.0 in | Wt 132.8 lb

## 2024-07-17 DIAGNOSIS — N1831 Chronic kidney disease, stage 3a: Secondary | ICD-10-CM

## 2024-07-17 DIAGNOSIS — N3946 Mixed incontinence: Secondary | ICD-10-CM | POA: Diagnosis not present

## 2024-07-17 DIAGNOSIS — M5442 Lumbago with sciatica, left side: Secondary | ICD-10-CM

## 2024-07-17 DIAGNOSIS — G8929 Other chronic pain: Secondary | ICD-10-CM | POA: Diagnosis not present

## 2024-07-17 DIAGNOSIS — G250 Essential tremor: Secondary | ICD-10-CM

## 2024-07-17 DIAGNOSIS — S32010A Wedge compression fracture of first lumbar vertebra, initial encounter for closed fracture: Secondary | ICD-10-CM | POA: Diagnosis not present

## 2024-07-17 DIAGNOSIS — E1122 Type 2 diabetes mellitus with diabetic chronic kidney disease: Secondary | ICD-10-CM | POA: Diagnosis not present

## 2024-07-17 DIAGNOSIS — Z23 Encounter for immunization: Secondary | ICD-10-CM | POA: Diagnosis not present

## 2024-07-17 DIAGNOSIS — M5441 Lumbago with sciatica, right side: Secondary | ICD-10-CM

## 2024-07-17 DIAGNOSIS — Z7985 Long-term (current) use of injectable non-insulin antidiabetic drugs: Secondary | ICD-10-CM | POA: Diagnosis not present

## 2024-07-17 MED ORDER — TIRZEPATIDE 10 MG/0.5ML ~~LOC~~ SOAJ: 10.0000 mg | SUBCUTANEOUS | 2 refills | Status: AC

## 2024-07-17 MED ORDER — VITAMIN K2 100 MCG PO CAPS: 1.0000 | ORAL_CAPSULE | Freq: Every day | ORAL | 4 refills | Status: AC

## 2024-07-17 MED ORDER — CALCITONIN (SALMON) 200 UNIT/ACT NA SOLN: 1.0000 | Freq: Every day | NASAL | 1 refills | Status: DC

## 2024-07-17 MED ORDER — T-RELIEF CBD+13 SL SUBL: 1.0000 | SUBLINGUAL_TABLET | Freq: Every day | SUBLINGUAL | 0 refills | Status: DC

## 2024-07-17 NOTE — Assessment & Plan Note (Signed)
 Chronic pain due to vertebral compression fracture and osteoporosis   Chronic back pain from a vertebral compression fracture at L1 shows 25% relief after a recent steroid injection. Osteoporosis management focuses on bone health. She is hesitant about nerve ablation due to its temporary effect and considers CBD gummies if current treatments fail. Prescribe CBD sublingual tablet daily for pain management. Resume calcitonin nasal spray for bone health. Add vitamin K2 with vitamin D3 and a calcium -containing meal or supplement. Discuss potential for spinal stimulator or nerve ablation if pain persists and medications are insufficient. Encourage exercise and a high-protein, calcium -rich diet to support bone health.

## 2024-07-17 NOTE — Patient Instructions (Signed)
 It was a pleasure seeing you today! Your health and satisfaction are our top priorities.  Bernardino Cone, MD  VISIT SUMMARY: Today, we discussed your chronic back pain, Raynaud's disease, essential tremor, and urinary incontinence. You received a flu shot and inquired about the RSV vaccination. We also reviewed your current medications and supplements.  YOUR PLAN: -CHRONIC PAIN DUE TO VERTEBRAL COMPRESSION FRACTURE AND OSTEOPOROSIS: Your chronic back pain is due to a vertebral compression fracture and osteoporosis. We will continue managing your pain with a daily CBD sublingual tablet and resume the calcitonin nasal spray for bone health. You should also add vitamin K2 to your vitamin D3 and take it with a calcium -containing meal or supplement. If your pain persists, we may consider a spinal stimulator or nerve ablation. Regular exercise and a high-protein, calcium -rich diet are encouraged to support bone health.  -RAYNAUD'S DISEASE: Raynaud's disease affects blood flow to certain parts of your body, usually your fingers and toes, causing them to feel numb and cold. Continue using warm gloves, especially in cold weather, to manage your symptoms.  -ESSENTIAL TREMOR: Essential tremor is a nervous system disorder that causes involuntary and rhythmic shaking. Your current medications are managing this condition. We discussed the Still device, a new treatment option that involves wearing a cap and may require shaving your head. Please discuss this with your neurologist.  -URINARY INCONTINENCE: Urinary incontinence is the loss of bladder control. Your condition has significantly improved with your current medications, trospium and methenamine . Continue with this management plan.  -ENCOUNTER FOR IMMUNIZATION: You received a flu shot today. For the RSV vaccination, please visit a pharmacy to obtain it.  INSTRUCTIONS: Please follow up with your eye specialist for your scheduled procedure on December 10th. If  your back pain persists or worsens, consider discussing the potential for a spinal stimulator or nerve ablation with your doctor. Continue with your current medications and supplements, and maintain a high-protein, calcium -rich diet. For the RSV vaccination, visit a pharmacy.  Your Providers PCP: Cone Bernardino MATSU, MD,  [phone]) Referring Provider: Cone Bernardino MATSU, MD,  [phone]) Care Team Provider: Donnald Charleston, MD,  [phone]) Care Team Provider: Porter Andrez JONELLE DEVONNA Care Team Provider: Kemp Coke,  [phone]) Care Team Provider: Caresse Cough, MD,  [phone]) Care Team Provider: Evonnie Asberry RAMAN, DO,  [phone]) Care Team Provider: Camella Delroy SQUIBB, MD Care Team Provider: Gerome Charleston, MD,  [phone]) Care Team Provider: Unice Pac, MD,  [phone]) Care Team Provider: Pandora Cadet, [facility] Care Team Provider: Dolphus Reiter, MD,  [phone]) Care Team Provider: Lonni Slain, MD,  [phone]) Care Team Provider: Fadeyi, Oluwatoyin Alaba, NP,  [phone]) Care Team Provider: Fadeyi, Oluwatoyin Alaba, NP,  [phone])  NEXT STEPS: [x]  Early Intervention: Schedule sooner appointment, call our on-call services, or go to emergency room if there is any significant Increase in pain or discomfort New or worsening symptoms Sudden or severe changes in your health [x]  Flexible Follow-Up: We recommend a No follow-ups on file. for optimal routine care. This allows for progress monitoring and treatment adjustments. [x]  Preventive Care: Schedule your annual preventive care visit! It's typically covered by insurance and helps identify potential health issues early. [x]  Lab & X-ray Appointments: Incomplete tests scheduled today, or call to schedule. X-rays: [facility] (M-F, 8:30am-noon or 1pm-5pm). [x]  Medical Information Release: Sign a release form at front desk to obtain relevant medical information we don't  have.  MAKING THE MOST OF OUR FOCUSED 20 MINUTE APPOINTMENTS: [x]   Clearly state your  top concerns at the beginning of the visit to focus our discussion [x]   If you anticipate you will need more time, please inform the front desk during scheduling - we can book multiple appointments in the same week. [x]   If you have transportation problems- use our convenient video appointments or ask about transportation support. [x]   We can get down to business faster if you use MyChart to update information before the visit and submit non-urgent questions before your visit. Thank you for taking the time to provide details through MyChart.  Let our nurse know and she can import this information into your encounter documents.  Arrival and Wait Times: [x]   Arriving on time ensures that everyone receives prompt attention. [x]   Early morning (8a) and afternoon (1p) appointments tend to have shortest wait times. [x]   Unfortunately, we cannot delay appointments for late arrivals or hold slots during phone calls.  Getting Answers and Following Up [x]   Simple Questions & Concerns: For quick questions or basic follow-up after your visit, reach us  at [phone] or MyChart messaging. [x]   Complex Concerns: If your concern is more complex, scheduling an appointment might be best. Discuss this with the staff to find the most suitable option. [x]   Lab & Imaging Results: We'll contact you directly if results are abnormal or you don't use MyChart. Most normal results will be on MyChart within 2-3 business days, with a review message from Dr. Jesus. Haven't heard back in 2 weeks? Need results sooner? Contact us  at [phone]. [x]   Referrals: Our referral coordinator will manage specialist referrals. The specialist's office should contact you within 2 weeks to schedule an appointment. Call us  if you haven't heard from them after 2 weeks.  Staying Connected [x]   MyChart: Activate your MyChart for the fastest way to  access results and message us . See the last page of this paperwork for instructions on how to activate.  Bring to Your Next Appointment [x]   Medications: Please bring all your medication bottles to your next appointment to ensure we have an accurate record of your prescriptions. [x]   Health Diaries: If you're monitoring any health conditions at home, keeping a diary of your readings can be very helpful for discussions at your next appointment.  Billing [x]   X-ray & Lab Orders: These are billed by separate companies. Contact the invoicing company directly for questions or concerns. [x]   Visit Charges: Discuss any billing inquiries with our administrative services team.  Your Satisfaction Matters [x]   Share Your Experience: We strive for your satisfaction! If you have any complaints, or preferably compliments, please let Dr. Jesus know directly or contact our Practice Administrators, Manuelita Rubin or [employer], by asking at the front desk.   Reviewing Your Records [x]   Review this early draft of your clinical encounter notes below and the final encounter summary tomorrow on MyChart after its been completed.  All orders placed so far are visible here: Chronic bilateral low back pain with bilateral sciatica -     T-Relief CBD+13; Place 1 tablet under the tongue daily at 6 (six) AM.  Dispense: 30 tablet; Refill: 0 -     Vitamin K2; Take 1 tablet by mouth daily at 6 (six) AM. Take together with vitamin d3 and calcium  containing meal or supplement.  Dispense: 90 capsule; Refill: 4 -     Calcitonin ([place]); Place 1 spray into alternate nostrils daily.  Dispense: 3.7 mL; Refill: 1  Immunization due -     Flu vaccine  HIGH DOSE PF(Fluzone  Trivalent)  Closed compression fracture of body of L1 vertebra (HCC) -     Vitamin K2; Take 1 tablet by mouth daily at 6 (six) AM. Take together with vitamin d3 and calcium  containing meal or supplement.  Dispense: 90 capsule; Refill: 4 -     Calcitonin  ([place]); Place 1 spray into alternate nostrils daily.  Dispense: 3.7 mL; Refill: 1  Type 2 diabetes mellitus with stage 3a chronic kidney disease, without long-term current use of insulin (HCC) -     Tirzepatide ; Inject 10 mg into the skin once a week. Replaces 7.5 mg dose  Dispense: 6 mL; Refill: 2  Essential tremor  Mixed stress and urge urinary incontinence

## 2024-07-17 NOTE — Assessment & Plan Note (Signed)
 Increase Mounjaro  per patient request Well-controlled

## 2024-07-17 NOTE — Progress Notes (Signed)
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 10/17/2023 CLOUDY (A)  CLEAR Final   Specific Gravity, Urine 10/17/2023 1.024  1.001 - 1.035 Final   pH 10/17/2023 5.5  5.0 - 8.0 Final   Glucose, UA 10/17/2023 NEGATIVE  NEGATIVE Final   Bilirubin Urine 10/17/2023 NEGATIVE  NEGATIVE Final   Ketones, ur 10/17/2023 TRACE (A)  NEGATIVE Final   Hgb urine dipstick 10/17/2023 NEGATIVE  NEGATIVE Final   Protein, ur 10/17/2023 TRACE (A)  NEGATIVE Final   Nitrite 10/17/2023 NEGATIVE  NEGATIVE Final   Leukocytes,Ua 10/17/2023 3+ (A)  NEGATIVE Final   WBC, UA 10/17/2023 > OR = 60 (A)  0 - 5 /HPF Final   RBC / HPF 10/17/2023 0-2  0 - 2 /HPF Final   Squamous Epithelial / HPF 10/17/2023 0-5  < OR = 5 /HPF Final   Bacteria, UA 10/17/2023 MODERATE (A)  NONE SEEN /HPF Final   Hyaline Cast 10/17/2023 NONE SEEN  NONE SEEN /LPF Final  There may be more visits with results that are not included.  No image results found. No results found.       ASSESSMENT & PLAN   Assessment & Plan Chronic  bilateral low back pain with bilateral sciatica Closed compression fracture of body of L1 vertebra (HCC) Chronic pain due to vertebral compression fracture and osteoporosis   Chronic back pain from a vertebral compression fracture at L1 shows 25% relief after a recent steroid injection. Osteoporosis management focuses on bone health. She is hesitant about nerve ablation due to its temporary effect and considers CBD gummies  if current treatments fail. Prescribe CBD sublingual tablet daily for pain management. Resume calcitonin nasal spray for bone health. Add vitamin K2 with vitamin D3 and a calcium -containing meal or supplement. Discuss potential for spinal stimulator or nerve ablation if pain persists and medications are insufficient. Encourage exercise and a high-protein, calcium -rich diet to support bone health. Immunization due After discussion of medical recommendations/risks/benefits, vaccine was given She received a flu shot and inquired about RSV vaccination. Advise obtaining RSV vaccination from a pharmacy. Type 2 diabetes mellitus with stage 3a chronic kidney disease, without long-term current use of insulin (HCC) Increase Mounjaro  per patient request Well-controlled  Essential tremor Essential tremor is managed with current medications. She inquires about the Still device, a non-medication treatment involving a cap that may require shaving the head. Discuss this potential new treatment option with a neurologist. Mixed stress and urge urinary incontinence Urinary incontinence has significantly improved with current management, with only occasional leakage. Continue current management with trospium and methenamine .   ORDER ASSOCIATIONS  #   DIAGNOSIS / CONDITION ICD-10 ENCOUNTER ORDER     ICD-10-CM   1. Chronic bilateral low back pain with bilateral sciatica  M54.42 Misc Natural Products (T-RELIEF CBD+13) SUBL   M54.41 Menaquinone-7 (VITAMIN K2) 100 MCG CAPS   G89.29 calcitonin, salmon,  (MIACALCIN/FORTICAL) 200 UNIT/ACT nasal spray    2. Immunization due  Z23 Flu vaccine HIGH DOSE PF(Fluzone  Trivalent)    3. Closed compression fracture of body of L1 vertebra (HCC)  S32.010A Menaquinone-7 (VITAMIN K2) 100 MCG CAPS    calcitonin, salmon, (MIACALCIN/FORTICAL) 200 UNIT/ACT nasal spray    4. Type 2 diabetes mellitus with stage 3a chronic kidney disease, without long-term current use of insulin (HCC)  E11.22 tirzepatide  (MOUNJARO ) 10 MG/0.5ML Pen   N18.31     5. Essential tremor  G25.0     6. Mixed stress and urge urinary incontinence  N39.46            Orders Placed in Encounter:    Meds ordered this encounter  Medications   Misc Natural Products (T-RELIEF CBD+13) SUBL    Sig: Place 1 tablet under the tongue daily at 6 (six) AM.    Dispense:  30 tablet    Refill:  0   Menaquinone-7 (VITAMIN K2) 100 MCG CAPS    Sig: Take 1 tablet by mouth daily at 6 (six) AM. Take together with vitamin d3 and calcium  containing meal or supplement.    Dispense:  90 capsule    Refill:  4   calcitonin, salmon, (MIACALCIN/FORTICAL) 200 UNIT/ACT nasal spray    Sig: Place 1 spray into alternate nostrils daily.    Dispense:  3.7 mL    Refill:  1   tirzepatide  (MOUNJARO ) 10 MG/0.5ML Pen    Sig: Inject 10 mg into the skin once a week. Replaces 7.5 mg dose    Dispense:  6 mL    Refill:  2    Orders Placed This Encounter  Procedures   Flu vaccine HIGH DOSE PF(Fluzone  Trivalent)       This document was synthesized by artificial intelligence ([employer]) using HIPAA-compliant recording of the clinical interaction;   We discussed the use of AI scribe software for clinical note transcription with the patient, who gave verbal consent to proceed. additional Info: This encounter employed state-of-the-art, real-time, collaborative documentation. The patient actively reviewed and assisted in updating their electronic medical record on a shared screen, ensuring transparency and facilitating joint  problem-solving for the problem list, overview, and  plan. This approach promotes accurate, informed care. The treatment plan was discussed and reviewed in detail, including medication safety, potential side effects, and all patient questions. We confirmed understanding and comfort with the plan. Follow-up instructions were established, including contacting the office for any concerns, returning if symptoms worsen, persist, or new symptoms develop, and precautions for potential emergency department visits.

## 2024-07-17 NOTE — Assessment & Plan Note (Signed)
 Essential tremor is managed with current medications. She inquires about the Still device, a non-medication treatment involving a cap that may require shaving the head. Discuss this potential new treatment option with a neurologist.

## 2024-07-18 DIAGNOSIS — M47814 Spondylosis without myelopathy or radiculopathy, thoracic region: Secondary | ICD-10-CM | POA: Diagnosis not present

## 2024-07-27 ENCOUNTER — Ambulatory Visit: Admitting: Internal Medicine

## 2024-07-31 ENCOUNTER — Other Ambulatory Visit (HOSPITAL_BASED_OUTPATIENT_CLINIC_OR_DEPARTMENT_OTHER): Payer: Self-pay

## 2024-08-02 ENCOUNTER — Other Ambulatory Visit (HOSPITAL_BASED_OUTPATIENT_CLINIC_OR_DEPARTMENT_OTHER): Payer: Self-pay

## 2024-08-02 DIAGNOSIS — M47814 Spondylosis without myelopathy or radiculopathy, thoracic region: Secondary | ICD-10-CM | POA: Diagnosis not present

## 2024-08-03 ENCOUNTER — Other Ambulatory Visit: Payer: Self-pay | Admitting: Internal Medicine

## 2024-08-13 ENCOUNTER — Ambulatory Visit: Admitting: Internal Medicine

## 2024-08-17 ENCOUNTER — Other Ambulatory Visit (HOSPITAL_BASED_OUTPATIENT_CLINIC_OR_DEPARTMENT_OTHER): Payer: Self-pay

## 2024-08-24 ENCOUNTER — Ambulatory Visit: Admitting: Internal Medicine

## 2024-08-27 DIAGNOSIS — M47814 Spondylosis without myelopathy or radiculopathy, thoracic region: Secondary | ICD-10-CM | POA: Diagnosis not present

## 2024-08-28 ENCOUNTER — Ambulatory Visit: Admitting: Internal Medicine

## 2024-09-05 ENCOUNTER — Ambulatory Visit: Admitting: Internal Medicine

## 2024-09-05 ENCOUNTER — Encounter: Payer: Self-pay | Admitting: Internal Medicine

## 2024-09-05 VITALS — BP 110/70 | HR 72 | Temp 98.0°F | Ht 62.0 in | Wt 128.8 lb

## 2024-09-05 DIAGNOSIS — R252 Cramp and spasm: Secondary | ICD-10-CM | POA: Diagnosis not present

## 2024-09-05 DIAGNOSIS — R52 Pain, unspecified: Secondary | ICD-10-CM

## 2024-09-05 DIAGNOSIS — G8929 Other chronic pain: Secondary | ICD-10-CM | POA: Diagnosis not present

## 2024-09-05 DIAGNOSIS — M5416 Radiculopathy, lumbar region: Secondary | ICD-10-CM

## 2024-09-05 DIAGNOSIS — S3992XD Unspecified injury of lower back, subsequent encounter: Secondary | ICD-10-CM | POA: Diagnosis not present

## 2024-09-05 DIAGNOSIS — M549 Dorsalgia, unspecified: Secondary | ICD-10-CM

## 2024-09-05 MED ORDER — CELECOXIB 100 MG PO CAPS: 100.0000 mg | ORAL_CAPSULE | Freq: Two times a day (BID) | ORAL | 0 refills | Status: DC

## 2024-09-05 MED ORDER — TRAMADOL HCL 50 MG PO TABS: 50.0000 mg | ORAL_TABLET | Freq: Four times a day (QID) | ORAL | 0 refills | Status: DC | PRN

## 2024-09-05 MED ORDER — BACLOFEN 10 MG PO TABS: 10.0000 mg | ORAL_TABLET | Freq: Three times a day (TID) | ORAL | 0 refills | Status: DC

## 2024-09-05 NOTE — Assessment & Plan Note (Signed)
 Chronic low back pain with lumbar radiculopathy and postprocedural pain   Chronic low back pain with lumbar radiculopathy is exacerbated by recent nerve ablation at L1 and T12. Postprocedural pain is likely due to nerve ablation and muscle spasm. Previous interventions, including steroid injections and nerve ablation, provided limited relief. Current pain management includes tramadol , with consideration for muscle relaxants and anti-inflammatories. She declined the potential use of a spinal stimulator. Pain may improve as nerve ablation effects take time to manifest. Prescribed tramadol  100 mg, 120 tablets for one month, to be taken as needed for pain. Prescribed Celebrex  for anti-inflammatory effects, with instructions to take with a full glass of fluid. Discussed potential use of baclofen  for muscle spasms, with a prescription for 60 tablets, to be taken twice daily. Scheduled follow-up in one month or sooner if pain worsens.

## 2024-09-05 NOTE — Progress Notes (Signed)
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 Final   pH 10/17/2023 5.5  5.0 - 8.0 Final   Glucose, UA 10/17/2023 NEGATIVE  NEGATIVE Final   Bilirubin Urine 10/17/2023 NEGATIVE  NEGATIVE Final   Ketones, ur 10/17/2023 TRACE (A)  NEGATIVE Final   Hgb urine dipstick 10/17/2023 NEGATIVE  NEGATIVE Final   Protein, ur 10/17/2023 TRACE (A)  NEGATIVE Final   Nitrite 10/17/2023 NEGATIVE  NEGATIVE Final   Leukocytes,Ua 10/17/2023 3+ (A)  NEGATIVE Final   WBC, UA 10/17/2023 > OR = 60 (A)  0 - 5 /HPF Final   RBC / HPF 10/17/2023 0-2  0 - 2 /HPF Final   Squamous Epithelial / HPF 10/17/2023 0-5  < OR = 5 /HPF Final   Bacteria, UA 10/17/2023 MODERATE (A)  NONE SEEN /HPF Final   Hyaline Cast 10/17/2023 NONE SEEN  NONE SEEN /LPF Final  There may be more visits with results that are not included.  No image results found. No results found.       ASSESSMENT & PLAN   Assessment & Plan Chronic back pain greater than 3 months duration Inflammatory pain Intractable pain Injury of back, subsequent encounter Lumbar  radiculopathy Chronic low back pain with lumbar radiculopathy and postprocedural pain   Chronic low back pain with lumbar radiculopathy is exacerbated by recent nerve ablation at L1 and T12. Postprocedural pain is likely due to nerve ablation and muscle spasm. Previous interventions, including steroid injections and nerve ablation, provided limited relief. Current pain management includes tramadol , with consideration for muscle relaxants and anti-inflammatories. She declined the potential  use of a spinal stimulator. Pain may improve as nerve ablation effects take time to manifest. Prescribed tramadol  100 mg, 120 tablets for one month, to be taken as needed for pain. Prescribed Celebrex  for anti-inflammatory effects, with instructions to take with a full glass of fluid. Discussed potential use of baclofen  for muscle spasms, with a prescription for 60 tablets, to be taken twice daily. Scheduled follow-up in one month or sooner if pain worsens. Spasticity Muscle spasm and cramping, lumbar region   Muscle spasms and cramping in the lumbar region are likely secondary to nerve ablation and chronic pain. Current muscle relaxant is methocarbamol , but considering alternative options for better efficacy. Baclofen  is considered for its effectiveness in managing spasticity. Prescribed baclofen  for muscle spasms, with instructions to take twice daily. Continue methocarbamol  as needed for muscle pain.   ORDER ASSOCIATIONS  #   DIAGNOSIS / CONDITION ICD-10 ENCOUNTER ORDER     ICD-10-CM   1. Chronic back pain greater than 3 months duration  M54.9 traMADol  (ULTRAM ) 50 MG tablet   G89.29 celecoxib  (CELEBREX ) 100 MG capsule    2. Lumbar radiculopathy  M54.16 traMADol  (ULTRAM ) 50 MG tablet    3. Injury of back, subsequent encounter  S39.92XD traMADol  (ULTRAM ) 50 MG tablet    celecoxib  (CELEBREX ) 100 MG capsule    4. Intractable pain  R52     5. Spasticity  R25.2 baclofen  (LIORESAL ) 10 MG tablet    6.  Inflammatory pain  R52      Meds ordered this encounter  Medications   traMADol  (ULTRAM ) 50 MG tablet    Sig: Take 1 tablet (50 mg total) by mouth every 6 (six) hours as needed.    Dispense:  120 tablet    Refill:  0   celecoxib  (CELEBREX ) 100 MG capsule    Sig: Take 1 capsule (100 mg total) by mouth 2 (two) times daily. Only for severe inflammatory pain, drink full glass of fluid with.    Dispense:  60 capsule    Refill:  0   baclofen  (LIORESAL ) 10 MG tablet    Sig: Take 1 tablet (10 mg total) by mouth 3 (three) times daily.    Dispense:  90 each    Refill:  0      This document was synthesized by artificial intelligence (Abridge) using HIPAA-compliant recording of the clinical interaction;   We discussed the use of AI scribe software for clinical note transcription with the patient, who gave verbal consent to proceed. additional Info: This encounter employed state-of-the-art, real-time, collaborative documentation. The patient actively reviewed and assisted in updating their electronic medical record on a shared screen, ensuring transparency and facilitating joint problem-solving for the problem list, overview, and plan. This approach promotes accurate, informed care. The treatment plan was discussed and reviewed in detail, including medication safety, potential side effects, and all patient questions. We confirmed understanding and comfort with the plan. Follow-up instructions were established, including contacting the office for any concerns, returning if symptoms worsen, persist, or new symptoms develop, and precautions for potential emergency department visits.

## 2024-09-05 NOTE — Patient Instructions (Signed)
 It was a pleasure seeing you today! Your health and satisfaction are our top priorities.  Laura Cone, MD  VISIT SUMMARY: Today, we discussed your persistent back pain following the recent nerve ablation procedure. You experienced temporary relief but the pain has returned, and you are considering adjustments to your pain management plan. We reviewed your current medications and discussed potential new treatments to help manage your symptoms.  YOUR PLAN: -CHRONIC LOW BACK PAIN WITH LUMBAR RADICULOPATHY AND POSTPROCEDURAL PAIN: Chronic low back pain with lumbar radiculopathy means you have ongoing lower back pain that radiates to your legs, and postprocedural pain is the discomfort following a medical procedure. We discussed that the pain might improve over time as the effects of the nerve ablation become more apparent. You are prescribed tramadol  100 mg, 120 tablets for one month, to be taken as needed for pain. Additionally, you are prescribed Celebrex  to help with inflammation, and you should take it with a full glass of fluid. We also discussed the potential use of baclofen  for muscle spasms, and you are prescribed 60 tablets to be taken twice daily.  -MUSCLE SPASM AND CRAMPING, LUMBAR REGION: Muscle spasms and cramping in the lumbar region are involuntary muscle contractions causing pain in your lower back. These spasms are likely due to the nerve ablation and chronic pain. You are prescribed baclofen  to help manage these spasms, to be taken twice daily. You can continue using methocarbamol  as needed for muscle pain.  INSTRUCTIONS: Please follow up in one month or sooner if your pain worsens.  Your Providers PCP: Farrell Laura MATSU, MD,  [phone]) Referring Provider: Cone Laura MATSU, MD,  [phone]) Care Team Provider: Donnald Charleston, MD,  [phone]) Care Team Provider: Porter Andrez JONELLE DEVONNA Care Team Provider: Kemp Coke,  [phone]) Care Team Provider: Caresse Cough, MD,  [phone]) Care Team Provider: Evonnie Asberry RAMAN, DO,  [phone]) Care Team Provider: Camella Delroy SQUIBB, MD Care Team Provider: Gerome Charleston, MD Care Team Provider: Unice Pac, MD,  [phone]) Care Team Provider: Pandora Cadet, [facility] Care Team Provider: Dolphus Reiter, MD,  [phone]) Care Team Provider: Lonni Slain, MD,  [phone]) Care Team Provider: Fadeyi, Oluwatoyin Alaba, NP,  [phone]) Care Team Provider: Fadeyi, Oluwatoyin Alaba, NP,  [phone])  NEXT STEPS: [x]  Early Intervention: Schedule sooner appointment, call our on-call services, or go to emergency room if there is any significant Increase in pain or discomfort New or worsening symptoms Sudden or severe changes in your health [x]  Flexible Follow-Up: We recommend a No follow-ups on file. for optimal routine care. This allows for progress monitoring and treatment adjustments. [x]  Preventive Care: Schedule your annual preventive care visit! It's typically covered by insurance and helps identify potential health issues early. [x]  Lab & X-ray Appointments: Incomplete tests scheduled today, or call to schedule. X-rays: [facility] (M-F, 8:30am-noon or 1pm-5pm). [x]  Medical Information Release: Sign a release form at front desk to obtain relevant medical information we don't have.  MAKING THE MOST OF OUR FOCUSED 20 MINUTE APPOINTMENTS: [x]   Clearly state your top concerns at the beginning of the visit to focus our discussion [x]   If you anticipate you will need more time, please inform the front desk during scheduling - we can book multiple appointments in the same week. [x]   If you have transportation problems- use our convenient video appointments or ask about transportation support. [x]   We can get down to business faster if you use MyChart to update information before the visit and submit  non-urgent questions before your visit. Thank you for taking the time  to provide details through MyChart.  Let our nurse know and she can import this information into your encounter documents.  Arrival and Wait Times: [x]   Arriving on time ensures that everyone receives prompt attention. [x]   Early morning (8a) and afternoon (1p) appointments tend to have shortest wait times. [x]   Unfortunately, we cannot delay appointments for late arrivals or hold slots during phone calls.  Getting Answers and Following Up [x]   Simple Questions & Concerns: For quick questions or basic follow-up after your visit, reach us  at [phone] or MyChart messaging. [x]   Complex Concerns: If your concern is more complex, scheduling an appointment might be best. Discuss this with the staff to find the most suitable option. [x]   Lab & Imaging Results: We'll contact you directly if results are abnormal or you don't use MyChart. Most normal results will be on MyChart within 2-3 business days, with a review message from Dr. Jesus. Haven't heard back in 2 weeks? Need results sooner? Contact us  at [phone]. [x]   Referrals: Our referral coordinator will manage specialist referrals. The specialist's office should contact you within 2 weeks to schedule an appointment. Call us  if you haven't heard from them after 2 weeks.  Staying Connected [x]   MyChart: Activate your MyChart for the fastest way to access results and message us . See the last page of this paperwork for instructions on how to activate.  Bring to Your Next Appointment [x]   Medications: Please bring all your medication bottles to your next appointment to ensure we have an accurate record of your prescriptions. [x]   Health Diaries: If you're monitoring any health conditions at home, keeping a diary of your readings can be very helpful for discussions at your next appointment.  Billing [x]   X-ray & Lab Orders: These are billed by separate companies. Contact the invoicing company directly for questions or concerns. [x]   Visit  Charges: Discuss any billing inquiries with our administrative services team.  Your Satisfaction Matters [x]   Share Your Experience: We strive for your satisfaction! If you have any complaints, or preferably compliments, please let Dr. Jesus know directly or contact our Practice Administrators, Manuelita Rubin or [employer], by asking at the front desk.   Reviewing Your Records [x]   Review this early draft of your clinical encounter notes below and the final encounter summary tomorrow on MyChart after its been completed.  All orders placed so far are visible here: Chronic back pain greater than 3 months duration -     traMADol  HCl; Take 1 tablet (50 mg total) by mouth every 6 (six) hours as needed.  Dispense: 120 tablet; Refill: 0 -     Celecoxib ; Take 1 capsule (100 mg total) by mouth 2 (two) times daily. Only for severe inflammatory pain, drink full glass of fluid with.  Dispense: 60 capsule; Refill: 0  Lumbar radiculopathy -     traMADol  HCl; Take 1 tablet (50 mg total) by mouth every 6 (six) hours as needed.  Dispense: 120 tablet; Refill: 0  Injury of back, subsequent encounter -     traMADol  HCl; Take 1 tablet (50 mg total) by mouth every 6 (six) hours as needed.  Dispense: 120 tablet; Refill: 0 -     Celecoxib ; Take 1 capsule (100 mg total) by mouth 2 (two) times daily. Only for severe inflammatory pain, drink full glass of fluid with.  Dispense: 60 capsule; Refill: 0  Intractable pain  Spasticity -     Baclofen ; Take 1 tablet (10 mg total) by mouth 3 (three) times daily.  Dispense: 90 each; Refill: 0  Inflammatory pain

## 2024-09-10 ENCOUNTER — Telehealth: Payer: Self-pay

## 2024-09-10 ENCOUNTER — Other Ambulatory Visit (HOSPITAL_COMMUNITY): Payer: Self-pay

## 2024-09-10 DIAGNOSIS — R110 Nausea: Secondary | ICD-10-CM

## 2024-09-10 MED ORDER — ONDANSETRON 4 MG PO TBDP: 4.0000 mg | ORAL_TABLET | Freq: Three times a day (TID) | ORAL | 2 refills | Status: AC | PRN

## 2024-09-10 NOTE — Telephone Encounter (Signed)
 Pharmacy Patient Advocate Encounter   Received notification from Onbase that prior authorization for traMADol  HCl 50MG  tablets is required/requested.   Insurance verification completed.   The patient is insured through [facility] ADVANTAGE/RX ADVANCE.   Per test claim: PA required; PA submitted to above mentioned insurance via Phone Key/confirmation #/EOC ---- Status is pending

## 2024-09-10 NOTE — Telephone Encounter (Signed)
 Copied from CRM #[id number]. Topic: Clinical - Medication Question >> Sep 10, 2024  1:40 PM Thliyah D wrote: Pt says the pills her pcp prescribed to her on Wednesday morning she would like to speak with her pcp or Maire Sermon with pt have another message with reply in to the provider

## 2024-09-10 NOTE — Telephone Encounter (Signed)
 Called and spoke with patient. She is all better. She agreed to restart medications 1 at a time, at [facility], with Zofran  on hand, and let me know if she has any issues Sent Zofran .

## 2024-09-10 NOTE — Addendum Note (Signed)
 Addended by: Deke Tilghman G on: 09/10/2024 06:43 PM   Modules accepted: Orders

## 2024-09-10 NOTE — Telephone Encounter (Signed)
 Spoke with pt about tramadol  baclofen  been sick throwing up for day or two stop taking would like to know what else she could take . She started back taking the old medication she was on. She states she is not taking any of the new anymore

## 2024-09-11 ENCOUNTER — Other Ambulatory Visit (HOSPITAL_COMMUNITY): Payer: Self-pay

## 2024-09-12 ENCOUNTER — Telehealth: Payer: Self-pay

## 2024-09-12 ENCOUNTER — Other Ambulatory Visit (HOSPITAL_BASED_OUTPATIENT_CLINIC_OR_DEPARTMENT_OTHER): Payer: Self-pay

## 2024-09-12 ENCOUNTER — Other Ambulatory Visit (HOSPITAL_COMMUNITY): Payer: Self-pay

## 2024-09-12 NOTE — Telephone Encounter (Signed)
 Patient notified of the tramadol  approval via MyChart.

## 2024-09-12 NOTE — Telephone Encounter (Signed)
 Pharmacy Patient Advocate Encounter   Received notification from Onbase that prior authorization for Ondansetron  4MG  dispersible tablets is required/requested.   Insurance verification completed.   The patient is insured through [facility] ADVANTAGE/RX ADVANCE.   Per test claim: PA required; PA submitted to above mentioned insurance via Latent Key/confirmation #/EOC [id number] Status is pending

## 2024-09-12 NOTE — Telephone Encounter (Signed)
 Pharmacy Patient Advocate Encounter  Received notification from [facility] ADVANTAGE/RX ADVANCE that Prior Authorization for traMADol  HCl 50MG  tablets  has been APPROVED from 09/11/24 to 10/10/24. Ran test claim, Copay is $0. This test claim was processed through [facility]- copay amounts may vary at other pharmacies due to pharmacy/plan contracts, or as the patient moves through the different stages of their insurance plan.   PA #/Case ID/Reference #: [id number]

## 2024-09-13 ENCOUNTER — Other Ambulatory Visit: Payer: Self-pay | Admitting: Internal Medicine

## 2024-09-13 DIAGNOSIS — L2989 Other pruritus: Secondary | ICD-10-CM | POA: Diagnosis not present

## 2024-09-13 DIAGNOSIS — L578 Other skin changes due to chronic exposure to nonionizing radiation: Secondary | ICD-10-CM | POA: Diagnosis not present

## 2024-09-13 DIAGNOSIS — R208 Other disturbances of skin sensation: Secondary | ICD-10-CM | POA: Diagnosis not present

## 2024-09-13 DIAGNOSIS — G8929 Other chronic pain: Secondary | ICD-10-CM

## 2024-09-13 DIAGNOSIS — S32010A Wedge compression fracture of first lumbar vertebra, initial encounter for closed fracture: Secondary | ICD-10-CM

## 2024-09-13 DIAGNOSIS — L538 Other specified erythematous conditions: Secondary | ICD-10-CM | POA: Diagnosis not present

## 2024-09-13 DIAGNOSIS — D2339 Other benign neoplasm of skin of other parts of face: Secondary | ICD-10-CM | POA: Diagnosis not present

## 2024-09-13 DIAGNOSIS — L820 Inflamed seborrheic keratosis: Secondary | ICD-10-CM | POA: Diagnosis not present

## 2024-09-13 NOTE — Telephone Encounter (Signed)
 Patient expressed that she had gotten her medication without an issue.

## 2024-09-15 ENCOUNTER — Other Ambulatory Visit: Payer: Self-pay | Admitting: Internal Medicine

## 2024-09-15 DIAGNOSIS — N393 Stress incontinence (female) (male): Secondary | ICD-10-CM

## 2024-09-17 DIAGNOSIS — Z961 Presence of intraocular lens: Secondary | ICD-10-CM | POA: Diagnosis not present

## 2024-09-17 DIAGNOSIS — D23122 Other benign neoplasm of skin of left lower eyelid, including canthus: Secondary | ICD-10-CM | POA: Diagnosis not present

## 2024-09-17 DIAGNOSIS — H43811 Vitreous degeneration, right eye: Secondary | ICD-10-CM | POA: Diagnosis not present

## 2024-09-17 DIAGNOSIS — Z947 Corneal transplant status: Secondary | ICD-10-CM | POA: Diagnosis not present

## 2024-09-27 ENCOUNTER — Encounter: Payer: Self-pay | Admitting: Internal Medicine

## 2024-09-27 ENCOUNTER — Ambulatory Visit: Admitting: Internal Medicine

## 2024-09-27 VITALS — BP 110/70 | HR 73 | Temp 98.0°F | Ht 62.0 in | Wt 139.2 lb

## 2024-09-27 DIAGNOSIS — Z803 Family history of malignant neoplasm of breast: Secondary | ICD-10-CM | POA: Diagnosis not present

## 2024-09-27 DIAGNOSIS — D369 Benign neoplasm, unspecified site: Secondary | ICD-10-CM | POA: Insufficient documentation

## 2024-09-27 DIAGNOSIS — Z780 Asymptomatic menopausal state: Secondary | ICD-10-CM | POA: Insufficient documentation

## 2024-09-27 DIAGNOSIS — G8929 Other chronic pain: Secondary | ICD-10-CM | POA: Diagnosis not present

## 2024-09-27 DIAGNOSIS — M549 Dorsalgia, unspecified: Secondary | ICD-10-CM

## 2024-09-27 DIAGNOSIS — M4856XD Collapsed vertebra, not elsewhere classified, lumbar region, subsequent encounter for fracture with routine healing: Secondary | ICD-10-CM

## 2024-09-27 DIAGNOSIS — S32010A Wedge compression fracture of first lumbar vertebra, initial encounter for closed fracture: Secondary | ICD-10-CM | POA: Insufficient documentation

## 2024-09-27 DIAGNOSIS — G43109 Migraine with aura, not intractable, without status migrainosus: Secondary | ICD-10-CM | POA: Insufficient documentation

## 2024-09-27 DIAGNOSIS — M47814 Spondylosis without myelopathy or radiculopathy, thoracic region: Secondary | ICD-10-CM | POA: Insufficient documentation

## 2024-09-27 DIAGNOSIS — E7800 Pure hypercholesterolemia, unspecified: Secondary | ICD-10-CM | POA: Insufficient documentation

## 2024-09-27 MED ORDER — IBANDRONATE SODIUM 150 MG PO TABS: 150.0000 mg | ORAL_TABLET | ORAL | 4 refills | Status: DC

## 2024-09-27 MED ORDER — LIDOCAINE 5 % EX PTCH: 1.0000 | MEDICATED_PATCH | CUTANEOUS | 4 refills | Status: DC

## 2024-09-27 NOTE — Assessment & Plan Note (Signed)
 Discussed possible estrogen replacement therapy.   Chronic back pain with lumbar radiculopathy and history of lumbar vertebral compression fracture   Chronic back pain persists with lumbar radiculopathy and a history of lumbar vertebral compression fracture. Previous medications, tramadol  and baclofen , were stopped due to nausea. Current management includes gabapentin , lidocaine  patches, and Effexor . Lidocaine  patches effectively manage back pain. Continue gabapentin  and Effexor . Prescribe lidocaine  5% patches, 90 at a time. Celebrex  was stopped due to potential kidney impact.  Osteopenia and risk of fragility fracture   Osteopenia with a history of fragility fracture. Estrogen therapy was discontinued due to high breast cancer risk. Current management includes vitamin D  supplementation. Ibandronate  (Boniva ) is prescribed for bone health due to its bone-building properties and potential to reduce breast cancer risk. Reclast  infusion is ongoing, so Boniva  cannot be used concurrently. Continue vitamin D  supplementation. Genetic screening for BRCA mutation and a breast MRI are ordered.

## 2024-09-27 NOTE — Progress Notes (Signed)
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 10/31/2023 4.2  3.5 - 5.1 mEq/L Final   Chloride 10/31/2023 104  96 - 112 mEq/L Final   CO2 10/31/2023 26  19 - 32 mEq/L Final   Glucose, Bld 10/31/2023 69 (L)  70 - 99 mg/dL Final   BUN 98/79/7974 14  6 - 23 mg/dL Final   Creatinine, Ser 10/31/2023 0.92  0.40 - 1.20 mg/dL Final   GFR 98/79/7974 59.06 (L)  >60.00 mL/min Final   Calcium  10/31/2023 9.9  8.4 - 10.5 mg/dL Final   VITD 98/79/7974 35.99  30.00 - 100.00 ng/mL Final  Office Visit on 10/24/2023  Component Date Value Ref Range Status   Color, Urine 10/24/2023 YELLOW  YELLOW Final   APPearance 10/24/2023 TURBID (A)  CLEAR Final   Specific Gravity, Urine 10/24/2023 1.019  1.001 - 1.035 Final   pH 10/24/2023 5.5  5.0 - 8.0 Final   Glucose, UA 10/24/2023 NEGATIVE  NEGATIVE Final   Bilirubin Urine 10/24/2023 NEGATIVE  NEGATIVE Final   Ketones, ur 10/24/2023 NEGATIVE  NEGATIVE Final   Hgb urine dipstick 10/24/2023 TRACE (A)  NEGATIVE Final   Protein, ur 10/24/2023 TRACE (A)  NEGATIVE Final   Nitrites, Initial 10/24/2023 POSITIVE (A)   NEGATIVE Final   Leukocyte Esterase 10/24/2023 3+ (A)  NEGATIVE Final   WBC, UA 10/24/2023 PACKED (A)  0 - 5 /HPF Final   RBC / HPF 10/24/2023 0-2  0 - 2 /HPF Final   Squamous Epithelial / HPF 10/24/2023 6-10 (A)  < OR = 5 /HPF Final   Bacteria, UA 10/24/2023 MANY (A)  NONE SEEN /HPF Final   Calcium  Oxalate Crystal 10/24/2023 FEW  NONE OR FEW /HPF Final   Hyaline Cast 10/24/2023 NONE SEEN  NONE SEEN /LPF Final   Note 10/24/2023    Final   MICRO NUMBER: 10/24/2023 [id number]   Final   SPECIMEN QUALITY: 10/24/2023 Adequate   Final   Sample Source 10/24/2023 URINE   Final   STATUS: 10/24/2023 FINAL   Final   ISOLATE 1: 10/24/2023 Escherichia coli (A)   Final   REFLEXIVE URINE CULTURE 10/24/2023    Final  Office Visit on 10/17/2023  Component Date Value Ref Range Status   Color, Urine 10/17/2023 YELLOW  YELLOW Final   APPearance 10/17/2023 CLOUDY (A)  CLEAR Final   Specific Gravity, Urine 10/17/2023 1.024  1.001 - 1.035 Final   pH 10/17/2023 5.5  5.0 - 8.0 Final   Glucose, UA 10/17/2023 NEGATIVE  NEGATIVE Final   Bilirubin Urine 10/17/2023 NEGATIVE  NEGATIVE Final   Ketones, ur 10/17/2023 TRACE (A)  NEGATIVE Final   Hgb urine dipstick 10/17/2023 NEGATIVE  NEGATIVE Final   Protein, ur 10/17/2023 TRACE (A)  NEGATIVE Final   Nitrite 10/17/2023 NEGATIVE  NEGATIVE Final   Leukocytes,Ua 10/17/2023 3+ (A)  NEGATIVE Final   WBC, UA 10/17/2023 > OR = 60 (A)  0 - 5 /HPF Final   RBC / HPF 10/17/2023 0-2  0 - 2 /HPF Final   Squamous Epithelial / HPF 10/17/2023 0-5  < OR = 5 /HPF Final   Bacteria, UA 10/17/2023 MODERATE (A)  NONE SEEN /HPF Final   Hyaline Cast 10/17/2023 NONE SEEN  NONE SEEN /LPF Final  There may be more visits with results that are not included.  No image results found. No results found.       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   Chronic back pain with lumbar radiculopathy and history of lumbar vertebral compression fracture   Chronic back pain persists with lumbar radiculopathy and a history of lumbar vertebral compression fracture. Previous medications, tramadol  and baclofen , were stopped due to nausea. Current management includes gabapentin , lidocaine  patches, and Effexor . Lidocaine  patches effectively manage back pain. Continue gabapentin  and Effexor . Prescribe lidocaine  5% patches, 90 at a time. Celebrex  was stopped due to potential kidney impact.  Osteopenia and risk of fragility fracture   Osteopenia with a history of fragility fracture. Estrogen therapy was discontinued due to high breast cancer risk. Current management includes vitamin D  supplementation. Ibandronate  (Boniva ) is prescribed for bone health due to its bone-building properties and potential to reduce breast cancer risk. Reclast  infusion is ongoing, so Boniva  cannot be used concurrently. Continue vitamin D  supplementation. Genetic screening for BRCA mutation and a breast MRI are ordered. FH: breast cancer Discussed a plan and decided against estrogen replacement therapy due to very strong history in family Genetic counseling and testing should be offered to this 78 year old woman given her strong family history of breast cancer in multiple first- and second-degree relatives, even though she is unaffected and her relatives were not tested. The [facility] recommends referral to a genetic counselor for individuals with >=3 diagnoses of breast cancer on the same side of the family, or those with a first- or second-degree blood relative meeting high-risk criteria.[1-2] While testing an affected relative first is most informative, testing unaffected family members who meet criteria is appropriate when affected relatives are unavailable.[2-3] Multigene panel testing including BRCA1, BRCA2, and PALB2 should be  considered, with additional genes selected based on family phenotype.[2][4] The American  Society of Clinical Oncology and Society of Surgical Oncology support genetic testing in patients with prior breast cancer who had testing before 2014, as earlier panels may have missed PALB2 and large genomic rearrangements.[3] If this patient declines testing or results are uninformative, management should be based on her family history and quantitative risk assessment using models like Tyrer-Cuzick or BRCAPRO.[1-2][5] Breast cancer surveillance for this high-risk patient should include annual clinical encounters and annual screening mammography with tomosynthesis.[6] If her lifetime risk exceeds 20% by validated models, annual breast MRI with contrast is recommended.[5-6] However, at age 63, surveillance should be individualized based on life expectancy (average life expectancy for a 80 year old woman is 10.2 years) and patient preferences.[1-2] The NCCN notes that for women >75 years, management is considered on an individual basis.[2] Risk reduction strategies including lifestyle modifications, chemoprevention, and surgical options should be discussed if life expectancy exceeds 10 years and the patient desires risk-reducing therapy.[1] Given her prior 5-year tamoxifen course and four benign biopsies, shared decision-making should weigh potential benefits against quality-of-life considerations at her age.[1][5][7]   Routine health maintenance is due, including A1c, mammogram, and eye exam. A breast MRI is recommended due to family history of breast cancer. A1c test, mammogram, eye exam, and breast MRI are ordered.  ORDER ASSOCIATIONS  #   DIAGNOSIS / CONDITION ICD-10 ENCOUNTER ORDER     ICD-10-CM   1. Chronic back pain greater than 3 months duration  M54.9 lidocaine  (LIDODERM ) 5 %   G89.29     2. FH: breast cancer  Z80.3 Genetic Screening    MR BREAST BILATERAL W WO CONTRAST INC CAD    3. Pathologic  compression fracture of lumbar vertebra with routine healing  M48.56XD ibandronate  (BONIVA ) 150 MG tablet         Orders Placed in Encounter:   Lab Orders         Genetic Screening    Imaging Orders         MR BREAST BILATERAL W WO CONTRAST INC CAD     Meds ordered this encounter  Medications   lidocaine  (LIDODERM ) 5 %    Sig: Place 1 patch onto the skin daily. Remove & Discard patch within 12 hours or as directed by MD    Dispense:  90 patch    Refill:  4   ibandronate  (BONIVA ) 150 MG tablet    Sig: Take 1 tablet (150 mg total) by mouth every 30 (thirty) days. Take in the morning with a full glass of water, on an empty stomach, and do not take anything else by mouth or lie down for the next 30 min.    Dispense:  90 tablet    Refill:  4    Orders Placed This Encounter  Procedures   MR BREAST BILATERAL W WO CONTRAST INC CAD    HTA ID # [id number] Epic PF:  DX: Breast cancer screening    Standing Status:   Future    Expected Date:   10/28/2024    Expiration Date:   09/27/2025    If indicated for the ordered procedure, I authorize the administration of contrast media per Radiology protocol:   Yes    What is the patient's sedation requirement?:   No Sedation    Does the patient have a pacemaker or implanted devices?:   No    Preferred imaging location?:   [facility] (table limit-550lbs)   Genetic Screening   On the day of the visit, I dedicated 33 minutes  to both direct and indirect patient care activities.  The time was spent: History: I obtained, documented, and reviewed a thorough medical history. I reviewed the patient's reported symptoms and clarified their context and significance in relation to the current visit. Examination: I conducted a medically appropriate physical evaluation. Data Synthesis: I synthesized information for clinical decision-making. Communication: I communicated clinical status and plan to the patient and/or family/caregiver. Documentation:  Documenting clinical findings and medical decision-making, and creating and providing documentation for patient review. Treatment Plan: I worked collaboratively with the patient to formulate and communicate an individualized plan (including shared decision-making). Orders: I placed necessary orders (medications, labs, imaging, referrals) in the EMR..  This time was spent independently of any separately billable procedures. Please note that this statement is intended to provide a clear and comprehensive account of the time and services provided during the patient's visit.  The extended time spent was necessary to provide safe, effective, and comprehensive care due to the following factors:, Patient Requested In-Depth Education on Disease Management: At the patient's request, I provided additional time to thoroughly educate them on self management of their chronic condition, review lifestyle modifications, and clarify follow up plans-time that exceeded typical encounter duration and was not separately billable., and Patient Driven Goal Setting and Wellness Planning: The patient wanted extended discussion to develop specific health goals and a personalized wellness plan, including diet, exercise, and stress reduction strategies-time that exceeded standard visit and was not separately billable.    This document was synthesized by artificial intelligence (Abridge) using HIPAA-compliant recording of the clinical interaction;   We discussed the use of AI scribe software for clinical note transcription with the patient, who gave verbal consent to proceed. additional Info: This encounter employed state-of-the-art, real-time, collaborative documentation. The patient actively reviewed and assisted in updating their electronic medical record on a shared screen, ensuring transparency and facilitating joint problem-solving for the problem list, overview, and plan. This approach promotes accurate, informed care. The  treatment plan was discussed and reviewed in detail, including medication safety, potential side effects, and all patient questions. We confirmed understanding and comfort with the plan. Follow-up instructions were established, including contacting the office for any concerns, returning if symptoms worsen, persist, or new symptoms develop, and precautions for potential emergency department visits.

## 2024-09-27 NOTE — Assessment & Plan Note (Addendum)
 Discussed possible estrogen replacement therapy.   Chronic back pain with lumbar radiculopathy and history of lumbar vertebral compression fracture   Chronic back pain persists with lumbar radiculopathy and a history of lumbar vertebral compression fracture. Previous medications, tramadol  and baclofen , were stopped due to nausea. Current management includes gabapentin , lidocaine  patches, and Effexor . Lidocaine  patches effectively manage back pain. Continue gabapentin  and Effexor . Prescribe lidocaine  5% patches, 90 at a time. Celebrex  was stopped due to potential kidney impact.  Osteopenia and risk of fragility fracture   Osteopenia with a history of fragility fracture. Estrogen therapy was discontinued due to high breast cancer risk. Current management includes vitamin D  supplementation. Ibandronate  (Boniva ) is prescribed for bone health due to its bone-building properties and potential to reduce breast cancer risk. Reclast  infusion is ongoing, so Boniva  cannot be used concurrently. Continue vitamin D  supplementation. Genetic screening for BRCA mutation and a breast MRI are ordered.

## 2024-09-27 NOTE — Patient Instructions (Addendum)
 It was a pleasure seeing you today! Your health and satisfaction are our top priorities.  Bernardino Cone, MD  VISIT SUMMARY: Today, we discussed your chronic back pain, osteopenia, and overall health maintenance. We reviewed your current medications and made some adjustments to better manage your symptoms and support your bone health. We also planned for some routine health screenings given your family history and current health status.  YOUR PLAN: -CHRONIC BACK PAIN WITH LUMBAR RADICULOPATHY AND HISTORY OF LUMBAR VERTEBRAL COMPRESSION FRACTURE: Chronic back pain with lumbar radiculopathy means you have ongoing back pain that radiates to your legs, often due to nerve irritation. We will continue your current medications, gabapentin  and Effexor , and prescribe 90 lidocaine  5% patches to help manage your pain. Celebrex  was stopped due to its potential impact on your kidneys.  -OSTEOPENIA AND RISK OF FRAGILITY FRACTURE: Osteopenia is a condition where your bones are weaker than normal, increasing the risk of fractures. We will continue your vitamin D  supplements and start you on ibandronate  (Boniva ) to help strengthen your bones and potentially reduce your breast cancer risk. We also ordered genetic screening for the BRCA mutation and a breast MRI.  -CHRONIC KIDNEY DISEASE, STAGE 1: Chronic kidney disease stage 1 means your kidneys have mild damage but are still functioning well. We stopped Celebrex  to avoid any potential harm to your kidneys.  -GENERAL HEALTH MAINTENANCE: Routine health maintenance is important for your overall well-being. We have ordered an A1c test, a mammogram, an eye exam, and a breast MRI to monitor your health and catch any potential issues early.  INSTRUCTIONS: Please schedule your A1c test, mammogram, eye exam, and breast MRI as soon as possible. Continue taking your current medications as prescribed and use the lidocaine  patches as needed for pain. If you experience any new  symptoms or have concerns, please contact our office.  Your Providers PCP: Cone Bernardino MATSU, MD,  [phone]) Referring Provider: Cone Bernardino MATSU, MD,  [phone]) Care Team Provider: Donnald Charleston, MD,  [phone]) Care Team Provider: Porter Andrez JONELLE DEVONNA Care Team Provider: Kemp Coke,  [phone]) Care Team Provider: Caresse Cough, MD,  [phone]) Care Team Provider: Evonnie Asberry RAMAN, DO,  [phone]) Care Team Provider: Camella Delroy SQUIBB, MD Care Team Provider: Gerome Charleston, MD Care Team Provider: Unice Pac, MD,  [phone]) Care Team Provider: Pandora Cadet, [facility] Care Team Provider: Dolphus Reiter, MD,  [phone]) Care Team Provider: Lonni Slain, MD,  [phone]) Care Team Provider: Fadeyi, Oluwatoyin Alaba, NP,  [phone]) Care Team Provider: Fadeyi, Oluwatoyin Alaba, NP,  [phone])  NEXT STEPS: [x]  Early Intervention: Schedule sooner appointment, call our on-call services, or go to emergency room if there is any significant Increase in pain or discomfort New or worsening symptoms Sudden or severe changes in your health [x]  Flexible Follow-Up: We recommend a No follow-ups on file. for optimal routine care. This allows for progress monitoring and treatment adjustments. [x]  Preventive Care: Schedule your annual preventive care visit! It's typically covered by insurance and helps identify potential health issues early. [x]  Lab & X-ray Appointments: Incomplete tests scheduled today, or call to schedule. X-rays:  [facility] (M-F, 8:30am-noon or 1pm-5pm). [x]  Medical Information Release: Sign a release form at front desk to obtain relevant medical information we don't have.  MAKING THE MOST OF OUR FOCUSED 20 MINUTE APPOINTMENTS: [x]   Clearly state your top concerns at the beginning of the visit to focus our discussion [x]   If you anticipate you will need more time, please inform the front  desk  during scheduling - we can book multiple appointments in the same week. [x]   If you have transportation problems- use our convenient video appointments or ask about transportation support. [x]   We can get down to business faster if you use MyChart to update information before the visit and submit non-urgent questions before your visit. Thank you for taking the time to provide details through MyChart.  Let our nurse know and she can import this information into your encounter documents.  Arrival and Wait Times: [x]   Arriving on time ensures that everyone receives prompt attention. [x]   Early morning (8a) and afternoon (1p) appointments tend to have shortest wait times. [x]   Unfortunately, we cannot delay appointments for late arrivals or hold slots during phone calls.  Getting Answers and Following Up [x]   Simple Questions & Concerns: For quick questions or basic follow-up after your visit, reach us  at [phone] or MyChart messaging. [x]   Complex Concerns: If your concern is more complex, scheduling an appointment might be best. Discuss this with the staff to find the most suitable option. [x]   Lab & Imaging Results: We'll contact you directly if results are abnormal or you don't use MyChart. Most normal results will be on MyChart within 2-3 business days, with a review message from Dr. Jesus. Haven't heard back in 2 weeks? Need results sooner? Contact us  at [phone]. [x]   Referrals: Our referral coordinator will manage specialist referrals. The specialist's office should contact you within 2 weeks to schedule an appointment. Call us  if you haven't heard from them after 2 weeks.  Staying Connected [x]   MyChart: Activate your MyChart for the fastest way to access results and message us . See the last page of this paperwork for instructions on how to activate.  Bring to Your Next Appointment [x]   Medications: Please bring all your medication bottles to your next appointment to ensure we  have an accurate record of your prescriptions. [x]   Health Diaries: If you're monitoring any health conditions at home, keeping a diary of your readings can be very helpful for discussions at your next appointment.  Billing [x]   X-ray & Lab Orders: These are billed by separate companies. Contact the invoicing company directly for questions or concerns. [x]   Visit Charges: Discuss any billing inquiries with our administrative services team.  Your Satisfaction Matters [x]   Share Your Experience: We strive for your satisfaction! If you have any complaints, or preferably compliments, please let Dr. Jesus know directly or contact our Practice Administrators, Manuelita Rubin or [employer], by asking at the front desk.   Reviewing Your Records [x]   Review this early draft of your clinical encounter notes below and the final encounter summary tomorrow on MyChart after its been completed.  All orders placed so far are visible here: Chronic back pain greater than 3 months duration Assessment & Plan: Discussed possible estrogen replacement therapy.   Orders: -     Lidocaine ; Place 1 patch onto the skin daily. 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 Take in the morning with a full glass of water, on an empty stomach, and do not  take anything else by mouth or lie down for the next 30 min.  Dispense: 90 tablet; Refill: 4

## 2024-09-28 ENCOUNTER — Telehealth: Payer: Self-pay

## 2024-09-28 ENCOUNTER — Other Ambulatory Visit: Payer: Self-pay

## 2024-09-28 ENCOUNTER — Other Ambulatory Visit: Payer: Self-pay | Admitting: Internal Medicine

## 2024-09-28 DIAGNOSIS — Z1231 Encounter for screening mammogram for malignant neoplasm of breast: Secondary | ICD-10-CM

## 2024-09-28 NOTE — Telephone Encounter (Signed)
 Pharmacy Patient Advocate Encounter   Received notification from Onbase that prior authorization for Lidocaine  5% patches is required/requested.   Insurance verification completed.   The patient is insured through [facility] ADVANTAGE/RX ADVANCE.   Per test claim: PA required; PA submitted to above mentioned insurance via Latent Key/confirmation #/EOC [id number] Status is pending

## 2024-09-28 NOTE — Telephone Encounter (Signed)
Pharmacy Patient Advocate Encounter  Received notification from [facility] [employer]VANTAGE/RX ADVANCE that Prior Authorization for Lidocaine 5% patches has been DENIED.  Full denial letter will be uploaded to the media tab. See denial reason below.

## 2024-09-28 NOTE — Telephone Encounter (Signed)
 Pharmacy Patient Advocate Encounter  Received notification from [facility] [employer]ANT[employer]/RX ADVANCE that Prior Authorization for  Ondansetron  4MG  dispersible tablets  has been DENIED.  Full denial letter will be uploaded to the media tab. See denial reason below.   PA #/Case ID/Reference #: [id number]

## 2024-09-28 NOTE — Telephone Encounter (Signed)
 Copied from CRM #[id number]. Topic: Clinical - Prescription Issue >> Sep 28, 2024  2:11 PM Jasmin G wrote: Reason for CRM: Pt requested a call back at [phone] from Ms. Nicki to discuss a recent prescription refill denial.  Sent message to pa team to start pa on patches pt stated pcp needs to write letter to support for them to get approved

## 2024-09-29 ENCOUNTER — Other Ambulatory Visit: Payer: Self-pay | Admitting: Internal Medicine

## 2024-09-29 DIAGNOSIS — G8929 Other chronic pain: Secondary | ICD-10-CM

## 2024-09-29 DIAGNOSIS — S3992XD Unspecified injury of lower back, subsequent encounter: Secondary | ICD-10-CM

## 2024-09-30 NOTE — Telephone Encounter (Signed)
 LETTER OF MEDICAL NECESSITY Lidocaine  5% Topical Patch - Prior Authorization Request  Patient Name Laura Farrell   Date of Birth Jan 06, 1946   Policy Number [id number]  [id number]   [employer] [place]    Date of Request 09/30/2024    PRIMARY DIAGNOSES (ICD-10) M54.5 - Low back pain M48.56XD - Collapsed vertebra, lumbar region, subsequent encounter M54.16 - Radiculopathy, lumbar region M81.0 - Age-related osteoporosis without current pathological fracture G89.29 - Other chronic pain N18.32 - Chronic kidney disease, stage 3b (improved to N18.2, stage 2)  CLINICAL SUMMARY This letter documents comprehensive clinical evidence supporting medical necessity for lidocaine  5% topical patches for Laura Farrell, an 78 year old female with complex multimorbid chronic pain.  Pain Etiology: Patient sustained L1 vertebral compression fracture with 40% height loss secondary to osteoporosis. She has chronic bilateral lumbar radiculopathy and persistent localized back pain at the fracture site. Pain significantly impacts mobility, sleep quality, and activities of daily living.  Critical Comorbidities Affecting Treatment Selection: Chronic Kidney Disease (Stage 3b, now improved to Stage 2) - Contraindication to NSAIDs, requires dose adjustment for many systemic analgesics High Fall Risk - Sedating systemic medications substantially increase fracture risk in osteoporotic patient Polypharmacy - Minimizing additional systemic medications essential for safety Age 62 - American Geriatrics Society Beers Criteria recommend avoiding systemic analgesics with high adverse effect profiles  COMPREHENSIVE DOCUMENTATION OF FAILED/INAPPROPRIATE THERAPIES Treatment Trial Period Outcome / Reason for Discontinuation  Tramadol  Within past year since spinal fracture Discontinued due to intolerable nausea and sedation; unacceptable fall risk in osteoporotic patient  Baclofen  Within past year since  spinal fracture   Discontinued due to excessive sedation causing fall risk and functional impairment  Celecoxib  (Celebrex ) Within past year since spinal fracture Discontinued due to CKD; NSAID-class contraindicated per nephrology guidance  All NSAIDs N/A Absolutely contraindicated - CKD Stage 3b precludes all NSAID use regardless of duration or formulation  Gabapentin  Currently tapering Minimal analgesic benefit despite adequate trial; dose-dependent sedation increases fall risk; being discontinued  Diclofenac  gel (Voltaren ) [Within past year since spinal fracture Inadequate penetration for deep vertebral/nerve root pain; systemic absorption concerns with CKD  Physical Therapy Ongoing Continued as adjunctive therapy; provides modest functional improvement but insufficient pain control alone  Opioids Not trialed other than tramadol  Patient and physician strongly prefer to avoid; CDC Guidelines recommend exhausting non-opioid options; sedation/fall risk in elderly unacceptable   Current Effective Multimodal Therapy: Venlafaxine  (Effexor ) - SNRI with some analgesic properties Tizanidine  - Low-dose muscle relaxant (used cautiously due to sedation) Physical therapy - Ongoing for functional maintenance Lidocaine  5% patches - ONLY intervention providing reliable localized pain relief  CLINICAL RATIONALE: WHY LIDOCAINE  PATCHES ARE MEDICALLY NECESSARY 1. Documented Efficacy: Lidocaine  patches provide the only reliable pain relief for this patient's focal vertebral fracture pain and radiculopathy without systemic adverse effects. Patient reports meaningful improvement in pain scores, sleep quality, and functional status when using patches consistently.  2. Superior Safety Profile: No renal excretion concerns - Safe with CKD (unlike NSAIDs, tramadol ) No sedation - Critical for fall prevention in osteoporotic patient No drug interactions - Important given polypharmacy Localized mechanism - Minimal systemic  absorption  3. All Alternatives Are Either Contraindicated, Failed, or Inappropriate: NSAIDs: Contraindicated (CKD) Opioids: Inappropriate (fall risk, CDC guidance, patient preference) Tramadol : Failed (nausea, sedation) Gabapentin : Failing (minimal benefit, sedation, being discontinued) Baclofen : Failed (sedation, fall risk) Topical NSAIDs: Inadequate (insufficient for deep pain, absorption concerns with CKD)  SUPPORTING CLINICAL GUIDELINES Guideline Source Relevant Recommendation  American Geriatrics Society (Beers  Criteria 2023) Recommends avoiding systemic NSAIDs, muscle relaxants, and sedating medications in older adults; supports topical analgesics as safer alternatives  CDC Guideline for Prescribing Opioids (2022) Recommends exhausting non-opioid therapies including topical treatments before considering opioids for chronic pain  American Academy of Pain Medicine Supports lidocaine  patches for localized neuropathic and musculoskeletal pain, particularly when systemic therapies contraindicated  KDIGO CKD Guidelines NSAIDs should be avoided in CKD; non-systemic analgesics preferred when possible   REQUEST Authorization Requested: Medication: Lidocaine  5% topical patch Quantity: 90 patches per 30 days (3 patches daily as prescribed) Duration: Chronic/ongoing - underlying conditions are permanent  SUMMARY This comprehensive documentation establishes that lidocaine  5% patches are medically necessary for this patient. All guideline-recommended systemic alternatives have been tried and failed, are contraindicated due to CKD, or pose unacceptable safety risks given her age, fall risk, and osteoporosis.  Lidocaine  patches are the only remaining safe and effective option for this patient's chronic pain management. Denial of this medication would leave her with no appropriate treatment alternatives, as opioid therapy would violate CDC guidance and substantially increase her fall and fracture  risk.  All documentation required for this authorization decision has been provided. Please contact me directly if any additional clinical information is needed.   Respectfully,  Bernardino KANDICE Cone MD, ABIM-certified [place] Medical License #[id number] [facility]IMARY [facility] [address] [place] [address] Dept: [phone] Dept Fax: [phone]   CC: Prior Authorization Department Enclosures:  last encounter

## 2024-10-01 ENCOUNTER — Encounter: Payer: Self-pay | Admitting: Internal Medicine

## 2024-10-01 ENCOUNTER — Ambulatory Visit: Admitting: Internal Medicine

## 2024-10-01 ENCOUNTER — Ambulatory Visit: Payer: Self-pay | Admitting: *Deleted

## 2024-10-01 DIAGNOSIS — G8929 Other chronic pain: Secondary | ICD-10-CM

## 2024-10-01 DIAGNOSIS — B0229 Other postherpetic nervous system involvement: Secondary | ICD-10-CM

## 2024-10-01 MED ORDER — LIDOCAINE 5 % EX PTCH: 1.0000 | MEDICATED_PATCH | CUTANEOUS | 4 refills | Status: AC

## 2024-10-01 MED ORDER — VALACYCLOVIR HCL 1 G PO TABS
1000.0000 mg | ORAL_TABLET | Freq: Three times a day (TID) | ORAL | 0 refills | Status: AC
  Filled 2024-10-02: qty 30, 10d supply, fill #0

## 2024-10-01 NOTE — Progress Notes (Signed)
 Patient reporting shingles recurrent and exquisitely painful to touch.  Delay until she can get to office... will begin treatment prior to appointment given urgency.

## 2024-10-01 NOTE — Telephone Encounter (Signed)
 FYI Only or Action Required?: FYI only for provider: appointment scheduled on 12/22.  Patient was last seen in primary care on 09/27/2024 by Jesus Bernardino MATSU, MD.  Called Nurse Triage reporting Rash.  Symptoms began several days ago.  Interventions attempted: Nothing.  Symptoms are: gradually worsening.  Triage Disposition: See PCP When Office is Open (Within 3 Days)  Patient/caregiver understands and will follow disposition?: yes  Copied from CRM #[id number]. Topic: Clinical - Red Word Triage >> Oct 01, 2024  9:28 AM Mia F wrote: Red Word that prompted transfer to Nurse Triage: Has a bad rash located in the thigh area and going up. She says that this has been there for about a week and getting worse. She says there is a sensation and she can barely tought the area without pain. Reason for Disposition  Mild widespread rash  (Exception: Heat rash lasting 3 days or less.)  Answer Assessment - Initial Assessment Questions Patient states she suspects she may have shingles- she has had vaccine and has hx of shingle with this type of symptom.   1. APPEARANCE of RASH: What does the rash look like? (e.g., blisters, dry flaky skin, red spots, redness, sores)     Discoloration on leg 2. SIZE: How big are the spots? (e.g., tip of pen, eraser, coin; inches, centimeters)     none 3. LOCATION: Where is the rash located?     R inner thigh to above the waist 4. COLOR: What color is the rash? (Note: It is difficult to assess rash color in people with darker-colored skin. When this situation occurs, simply ask the caller to describe what they see.)     Red an spotty 5. ONSET: When did the rash begin?     5-6 days 6. FEVER: Do you have a fever? If Yes, ask: What is your temperature, how was it measured, and when did it start?     no 7. ITCHING: Does the rash itch? If Yes, ask: How bad is the itch? (Scale 1-10; or mild, moderate, severe)     no 8. CAUSE: What do you think is  causing the rash?     Shingles hx 9. MEDICINE FACTORS: Have you started any new medicines within the last 2 weeks? (e.g., antibiotics)      Using pain patch- on back 10. OTHER SYMPTOMS: Do you have any other symptoms? (e.g., dizziness, headache, sore throat, joint pain)       no  Protocols used: Rash or Redness - [facility]

## 2024-10-02 ENCOUNTER — Ambulatory Visit: Admitting: Physician Assistant

## 2024-10-02 ENCOUNTER — Other Ambulatory Visit (HOSPITAL_COMMUNITY): Payer: Self-pay

## 2024-10-02 ENCOUNTER — Other Ambulatory Visit (HOSPITAL_BASED_OUTPATIENT_CLINIC_OR_DEPARTMENT_OTHER): Payer: Self-pay

## 2024-10-02 ENCOUNTER — Encounter: Payer: Self-pay | Admitting: Physician Assistant

## 2024-10-02 ENCOUNTER — Telehealth: Payer: Self-pay

## 2024-10-02 VITALS — BP 110/70 | HR 76 | Temp 97.1°F | Ht 62.0 in | Wt 137.8 lb

## 2024-10-02 DIAGNOSIS — B0229 Other postherpetic nervous system involvement: Secondary | ICD-10-CM | POA: Diagnosis not present

## 2024-10-02 MED ORDER — GABAPENTIN 600 MG PO TABS: 600.0000 mg | ORAL_TABLET | Freq: Every day | ORAL | 0 refills | Status: DC

## 2024-10-02 NOTE — Telephone Encounter (Signed)
 Pharmacy Patient Advocate Encounter   Received notification from Physician's Office that prior authorization for Lidocaine  5% Patches is required/requested.   Insurance verification completed.   The patient is insured through [facility] [employer] ADVANCE.   Per test claim: PA required; PA submitted to above mentioned insurance via Phone Key/confirmation #/[id number] Status is pending  Resubmission with new diagnosis.

## 2024-10-02 NOTE — Patient Instructions (Signed)
 Please follow up with Dr Jesus next week for shingles recheck.

## 2024-10-02 NOTE — Progress Notes (Signed)
 "  History of Present Illness:   Chief Complaint  Patient presents with   Rash    Inner thigh and waist  Possible shingles  Symptoms x 1 week     Discussed the use of AI scribe software for clinical note transcription with the patient, who gave verbal consent to proceed.  History of Present Illness   Laura Farrell is an 78 year old female with a history of shingles who presents with suspected shingles.  She developed a tender, discolored spot on her neck about one week ago that has progressed upward without rash, blistering, or wrapping pain. She has had shingles before and has received both the original and newer shingles vaccines. She has not yet started the prescribed medication for this episode. She previously used gabapentin  600 mg at night for hot flashes but is unsure if she has any currently.   Past Medical History:  Diagnosis Date   Acquired spondylolisthesis 02/16/2017   Acute kidney injury 06/21/2022   Adenomatous polyp 09/27/2024   Anemia in chronic kidney disease (CKD) 08/17/2022   07/12/2023: Hemoglobin stable at 12.6 g/dL. Ferritin 24.1 ng/mL (low-normal). Continue current management, consider oral iron  supplementation if ferritin drops further.  06/08/2023: Hemoglobin 12.6 g/dL, folate 4.3 ng/mL (low). Considered folate supplementation.             Lab Results      Component    Value    Date/Time           GFR    59.06 (L)    10/31/2023 09:31 AM           GFR    49.91 (L)   Arthritis    Atrophic glossitis 08/05/2023   Cellulitis 06/11/2022   Cerumen debris on tympanic membrane 07/16/2022   Chronic tension headaches 08/14/2018   CKD (chronic kidney disease) stage 3, GFR 30-59 ml/min (HCC) 07/16/2022   Compression fracture of L1 lumbar vertebra (HCC) 09/27/2024   Diabetes mellitus    Displacement of lumbar intervertebral disc without myelopathy 04/03/2013   Drug-induced constipation 09/14/2023   Folic acid  deficiency 09/14/2023   Lab Results      Component    Value     Date           FOLATE    3.1 (L)    08/05/2023           GERD (gastroesophageal reflux disease)    Headache(784.0)    Hearing loss 06/11/2022    She follows with hearing study in trial at [facility] She often doesn't wear the hearing aids since her hearing is decent.   History of back surgery 06/11/2022   Surgery 2005ish helped a lot, but still has pain up above the low back in her back.   History of cluster headache 06/28/2012   History of colon polyps 08/17/2022   Has really just records but difficult to find them in the chart. Last colonoscopy around August to September 2023 was completely negative for any polyps and she was advised no further follow-up would be needed by patient report The previous colonoscopy before that had 2 polyps in 2015 and she was advised to 7-year follow-up History of anal fissure   Hives 09/05/2023      New onset urticaria likely medication-related  Temporal association with Mounjaro  restart  Trial discontinuation of medications initiated  Current management with antihistamines and topical treatments     HLP (hyperkeratosis lenticularis perstans)    Hot flashes  Hyperkalemia 06/21/2022   We talked about her potassium and I think that this is a combination of too much potassium in the diet with kidney disease and her having developed worsening kidney disease this year and so I explained to her about a renal disease diet and limiting potassium to some degree on it although she does not have to do as much as she has been and I am giving that diet is a handout for the after visit summ   Hyperlipidemia    Lumbar spondylosis 06/11/2022   Menopausal hot flushes 08/14/2018   On effexor  and gabapentin    Migraine with aura 09/27/2024   Mixed stress and urge urinary incontinence 12/15/2020   complete hysterectomy and both ovaries removed around 30 years ago  Mixed urge and urinary incontinence has worsened over past 2 years, forcing her to wear a pad all the time.   Not associated with urinary tract infection (UTI) or dyspareunia  Had surgical repair lumbar bulging disk l4-l5 10-15 years ago and back has been not bothering her much since then.   She has hypothyroidism that is well cont   Nephrolithiasis    Neuromuscular disorder (HCC)    Over weight    Overweight 09/28/2022   Wt Readings from Last 50 Encounters: 09/28/22 170 lb 12.8 oz (77.5 kg) 09/20/22 173 lb 3.2 oz (78.6 kg) 09/07/22 173 lb 6.4 oz (78.7 kg) 08/17/22 171 lb 12.8 oz (77.9 kg) 07/16/22 165 lb 12.8 oz (75.2 kg) 06/11/22 160 lb 6.4 oz (72.8 kg) 05/26/22 161 lb 6.4 oz (73.2 kg) 05/13/22 161 lb (73 kg) 03/11/22 154 lb 9.6 oz (70.1 kg) 01/18/22 151 lb 9.6 oz (68.8 kg) 01/13/22 148 lb 9.6 oz (67.4   Prediabetes 12/27/2019   Lab Results  Component  Value  Date     HGBA1C  6.1  11/04/2021      Thyroid  disease    Tremor 02/16/2017   Tubular adenoma of colon 12/27/2019   Colonoscopy, Dr. Donnald, 02/2015: tubular adenoma, 5 year recall   Urine incontinence      Social History[1]  Past Surgical History:  Procedure Laterality Date   ABDOMINAL HYSTERECTOMY     APPENDECTOMY     BREAST EXCISIONAL BIOPSY Left    BREAST EXCISIONAL BIOPSY Left    BREAST EXCISIONAL BIOPSY Left    BREAST SURGERY     CATARACT EXTRACTION Right 2017   cornea implant     EYE SURGERY     left shoulder     lower back surgery     SPINE SURGERY  2005   tubal ligation  1980   TUBAL LIGATION     VESICOVAGINAL FISTULA CLOSURE W/ TAH  1995    Family History  Problem Relation Age of Onset   Tremor Mother    Pancreatic cancer Mother    Tremor Father    Cancer Father        lung   Breast cancer Sister 40   Tremor Sister    Tremor Sister    Tremor Brother     Allergies[2]  Current Medications:  Current Medications[3]   Review of Systems:   Negative unless otherwise specified per HPI.  Vitals:   Vitals:   10/02/24 0915  BP: 110/70  Pulse: 76  Temp: (!) 97.1 F (36.2 C)  TempSrc: Temporal   SpO2: 95%  Weight: 137 lb 12.8 oz (62.5 kg)  Height: 5' 2 (1.575 m)     Body mass index is 25.2 kg/m.  Physical Exam:  Physical Exam Constitutional:      Appearance: Normal appearance. She is well-developed.  HENT:     Head: Normocephalic and atraumatic.  Eyes:     General: Lids are normal.     Extraocular Movements: Extraocular movements intact.     Conjunctiva/sclera: Conjunctivae normal.  Pulmonary:     Effort: Pulmonary effort is normal.  Musculoskeletal:        General: Normal range of motion.     Cervical back: Normal range of motion and neck supple.  Skin:    General: Skin is warm and dry.     Comments: No significant lesions Slight erythema to right abdominal area and right upper thigh with sensitivity to touch  Neurological:     Mental Status: She is alert and oriented to person, place, and time.  Psychiatric:        Attention and Perception: Attention and perception normal.        Mood and Affect: Mood normal.        Behavior: Behavior normal.        Thought Content: Thought content normal.        Judgment: Judgment normal.     Assessment and Plan:   Assessment and Plan    Herpes zoster Recurrent episode with tenderness and discoloration on her trunk and upper thigh. No rash or blisters. Previous shingles vaccination received. Gabapentin  considered for pain management. - Start Valacyclovir  1 gram three times daily for one week. Her PCP has already sent this in but she has not started this. - Started Gabapentin  600 mg at night for pain management. I have asked her to double check her medications at home to make sure she is not already taking this and recommended that she only take 600 mg nightly. - Monitor for blisters and avoid contact with vulnerable populations if blisters develop. - Patient is requesting office visit with Dr Jesus for recheck next week     Lucie Buttner, PA-C     [1]  Social History Tobacco Use   Smoking status: Never     Passive exposure: Past   Smokeless tobacco: Never  Vaping Use   Vaping status: Never Used  Substance Use Topics   Alcohol use: No   Drug use: No  [2]  Allergies Allergen Reactions   Other Other (See Comments)    Liquid medication  [3]  Current Outpatient Medications:    aspirin EC 81 MG tablet, 81 mg daily., Disp: , Rfl:    celecoxib  (CELEBREX ) 100 MG capsule, TAKE 1 CAPSULE BY MOUTH 2 TIMES A DAY OMLY FOR SEVERE INFLAMMATORY PAIN DRINK FULL GLASS OF FLUID WITH, Disp: 60 capsule, Rfl: 0   Cholecalciferol  (VITAMIN D3) 125 MCG (5000 UT) CAPS, Take 1 capsule (5,000 Units total) by mouth daily., Disp: 100 capsule, Rfl: 3   Cranberry Extract 250 MG TABS, Take 1 tablet by mouth daily at 6 (six) AM., Disp: 90 tablet, Rfl: 3   cyanocobalamin  (VITAMIN B12) 1000 MCG tablet, Take 1,000 mcg by mouth daily., Disp: , Rfl:    diclofenac  Sodium (VOLTAREN ) 1 % GEL, APPLY 2 GRAMS FOUR TIMES A DAY, Disp: 400 g, Rfl: 11   estradiol  (ESTRACE ) 0.1 MG/GM vaginal cream, Place 1 Applicatorful vaginally 3 (three) times a week., Disp: 42.5 g, Rfl: 1   Evolocumab  (REPATHA  SURECLICK) 140 MG/ML SOAJ, Inject 140 mg into the skin every 14 (fourteen) days., Disp: 2 mL, Rfl: 2   ezetimibe  (ZETIA ) 10 MG tablet, Take 1 tablet (10 mg total) by  mouth daily., Disp: 90 tablet, Rfl: 3   Finerenone  (KERENDIA ) 20 MG TABS, Take 1 tablet (20 mg total) by mouth daily., Disp: 90 tablet, Rfl: 3   folic acid  (FOLVITE ) 1 MG tablet, TAKE 1 TABLET BY MOUTH DAILY, Disp: 90 tablet, Rfl: 4   gabapentin  (NEURONTIN ) 600 MG tablet, Take 1 tablet (600 mg total) by mouth at bedtime., Disp: 30 tablet, Rfl: 0   ibandronate  (BONIVA ) 150 MG tablet, Take 1 tablet (150 mg total) by mouth every 30 (thirty) days. Take in the morning with a full glass of water, on an empty stomach, and do not take anything else by mouth or lie down for the next 30 min., Disp: 90 tablet, Rfl: 4   levothyroxine  (SYNTHROID ) 100 MCG tablet, TAKE 1 TABLET BY MOUTH DAILY, Disp:  90 tablet, Rfl: 4   lidocaine  (LIDODERM ) 5 %, Place 1 patch onto the skin daily. Remove & Discard patch within 12 hours or as directed by MD, Disp: 90 patch, Rfl: 4   Menaquinone-7 (VITAMIN K2 ) 100 MCG CAPS, Take 1 tablet by mouth daily at 6 (six) AM. Take together with vitamin d3 and calcium  containing meal or supplement., Disp: 90 capsule, Rfl: 4   methenamine  (HIPREX ) 1 g tablet, Take 1 tablet (1 g total) by mouth 2 (two) times daily. Take with vitamin C - which activates antibiotic activity in the bladder., Disp: 180 tablet, Rfl: 3   methocarbamol  (ROBAXIN ) 500 MG tablet, Take 1 tablet (500 mg total) by mouth 4 (four) times daily., Disp: 60 tablet, Rfl: 2   metroNIDAZOLE  (METROGEL ) 0.75 % gel, Apply topically., Disp: , Rfl:    Misc Natural Products (T-RELIEF CBD+13) SUBL, Place 1 tablet under the tongue daily at 6 (six) AM., Disp: 30 tablet, Rfl: 0   Multiple Vitamins-Minerals (CENTRUM SILVER PO), Take by mouth., Disp: , Rfl:    Multiple Vitamins-Minerals (CITRACAL +D3) TABS, Take 1 tablet by mouth daily at 6 (six) AM., Disp: 90 tablet, Rfl: 3   NIFEdipine  (PROCARDIA -XL/NIFEDICAL-XL) 30 MG 24 hr tablet, TAKE 1 TABLET BY MOUTH DAILY, Disp: 90 tablet, Rfl: 4   omega-3 acid ethyl esters (LOVAZA ) 1 g capsule, Take 2 capsules (2 g total) by mouth 2 (two) times daily., Disp: 360 capsule, Rfl: 3   ondansetron  (ZOFRAN -ODT) 4 MG disintegrating tablet, Take 1 tablet (4 mg total) by mouth every 8 (eight) hours as needed for nausea or vomiting., Disp: 30 tablet, Rfl: 2   polycarbophil (FIBERCON) 625 MG tablet, Take 2 tablets (1,250 mg total) by mouth daily., Disp: 180 tablet, Rfl: 3   primidone  (MYSOLINE ) 50 MG tablet, 2 in the AM, 4 in the evening, Disp: 540 tablet, Rfl: 3   Probiotic Product (PROBIOTIC DAILY PO), Take by mouth., Disp: , Rfl:    propranolol  (INDERAL ) 10 MG tablet, Take 1 tablet (10 mg total) by mouth 2 (two) times daily., Disp: 180 tablet, Rfl: 3   rosuvastatin  (CRESTOR ) 40 MG tablet, Take  1 tablet (40 mg total) by mouth daily., Disp: 90 tablet, Rfl: 3   solifenacin  (VESICARE ) 10 MG tablet, TAKE 1 TABLET BY MOUTH DAILY, Disp: 90 tablet, Rfl: 3   tirzepatide  (MOUNJARO ) 10 MG/0.5ML Pen, Inject 10 mg into the skin once a week. Replaces 7.5 mg dose, Disp: 6 mL, Rfl: 2   tiZANidine  (ZANAFLEX ) 2 MG tablet, Take 1 tablet (2 mg total) by mouth every 6 (six) hours as needed for muscle spasms., Disp: 180 tablet, Rfl: 4   trospium (SANCTURA) 20 MG tablet, Take 20 mg  by mouth 2 (two) times daily., Disp: , Rfl:    valACYclovir  (VALTREX ) 1000 MG tablet, Take 1 tablet (1,000 mg total) by mouth 3 (three) times daily for 10 days., Disp: 30 tablet, Rfl: 0   venlafaxine  XR (EFFEXOR -XR) 37.5 MG 24 hr capsule, Take 37.5 mg by mouth daily., Disp: , Rfl:   Current Facility-Administered Medications:    Romosozumab -aqqg (EVENITY ) 105 MG/1. injection 210 mg, 210 mg, Subcutaneous, Once, Jesus Bernardino MATSU, MD   zoledronic  acid (RECLAST ) injection 5 mg, 5 mg, Intravenous, Once,   "

## 2024-10-02 NOTE — Telephone Encounter (Signed)
 Pharmacy Patient Advocate Encounter  Received notification from [facility] ADVANTAGE/RX ADVANCE that Prior Authorization for  Lidocaine  5% Patches  has been APPROVED from 10/02/24 to 10/02/25
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
## 2024-10-08 ENCOUNTER — Ambulatory Visit: Admitting: Internal Medicine

## 2024-10-09 ENCOUNTER — Encounter: Payer: Self-pay | Admitting: Internal Medicine

## 2024-10-09 ENCOUNTER — Ambulatory Visit (INDEPENDENT_AMBULATORY_CARE_PROVIDER_SITE_OTHER): Admitting: Internal Medicine

## 2024-10-09 VITALS — BP 110/76 | HR 74 | Temp 98.0°F | Ht 62.0 in | Wt 141.0 lb

## 2024-10-09 DIAGNOSIS — E7800 Pure hypercholesterolemia, unspecified: Secondary | ICD-10-CM

## 2024-10-09 DIAGNOSIS — R251 Tremor, unspecified: Secondary | ICD-10-CM | POA: Diagnosis not present

## 2024-10-09 DIAGNOSIS — R238 Other skin changes: Secondary | ICD-10-CM

## 2024-10-09 MED ORDER — PROPRANOLOL HCL 10 MG PO TABS: 10.0000 mg | ORAL_TABLET | Freq: Three times a day (TID) | ORAL | 3 refills | Status: AC

## 2024-10-09 MED ORDER — REPATHA SURECLICK 140 MG/ML ~~LOC~~ SOAJ: 140.0000 mg | SUBCUTANEOUS | 4 refills | Status: AC

## 2024-10-09 NOTE — Progress Notes (Unsigned)
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   Specific Gravity, Urine 10/24/2023 1.019  1.001 - 1.035 Final   pH 10/24/2023 5.5  5.0 - 8.0 Final   Glucose, UA 10/24/2023 NEGATIVE  NEGATIVE Final   Bilirubin Urine 10/24/2023 NEGATIVE  NEGATIVE Final   Ketones, ur 10/24/2023 NEGATIVE  NEGATIVE Final   Hgb urine dipstick 10/24/2023 TRACE (A)  NEGATIVE Final   Protein, ur 10/24/2023 TRACE (A)  NEGATIVE Final   Nitrites, Initial 10/24/2023 POSITIVE (A)  NEGATIVE Final   Leukocyte Esterase 10/24/2023 3+ (A)  NEGATIVE Final   WBC, UA 10/24/2023 PACKED (A)  0 - 5 /HPF Final   RBC / HPF 10/24/2023 0-2  0 - 2 /HPF Final   Squamous Epithelial / HPF 10/24/2023 6-10 (A)  < OR = 5 /HPF Final   Bacteria, UA 10/24/2023 MANY (A)  NONE SEEN /HPF Final   Calcium  Oxalate Crystal 10/24/2023 FEW  NONE OR FEW /HPF Final   Hyaline Cast 10/24/2023 NONE SEEN  NONE SEEN /LPF Final   Note 10/24/2023    Final   MICRO NUMBER: 10/24/2023 [id number]   Final   SPECIMEN QUALITY: 10/24/2023 Adequate   Final   Sample Source 10/24/2023 URINE   Final   STATUS: 10/24/2023  FINAL   Final   ISOLATE 1: 10/24/2023 Escherichia coli (A)   Final   REFLEXIVE URINE CULTURE 10/24/2023    Final  Office Visit on 10/17/2023  Component  Date Value Ref Range Status   Color, Urine 10/17/2023 YELLOW  YELLOW Final   APPearance 10/17/2023 CLOUDY (A)  CLEAR Final   Specific Gravity, Urine 10/17/2023 1.024  1.001 - 1.035 Final   pH 10/17/2023 5.5  5.0 - 8.0 Final   Glucose, UA 10/17/2023 NEGATIVE  NEGATIVE Final   Bilirubin Urine 10/17/2023 NEGATIVE  NEGATIVE Final   Ketones, ur 10/17/2023 TRACE (A)  NEGATIVE Final   Hgb urine dipstick 10/17/2023 NEGATIVE  NEGATIVE Final   Protein, ur 10/17/2023 TRACE (A)  NEGATIVE Final   Nitrite 10/17/2023 NEGATIVE  NEGATIVE Final   Leukocytes,Ua 10/17/2023 3+ (A)  NEGATIVE Final   WBC, UA 10/17/2023 > OR = 60 (A)  0 - 5 /HPF Final   RBC / HPF 10/17/2023 0-2  0 - 2 /HPF Final   Squamous Epithelial / HPF 10/17/2023 0-5  < OR = 5 /HPF Final   Bacteria, UA 10/17/2023 MODERATE (A)  NONE SEEN /HPF Final   Hyaline Cast 10/17/2023 NONE SEEN  NONE SEEN /LPF Final  There may be more visits with results that are not included.  No image results found. No results found.  Physical Exam   ruptured bulla on her back around T12-L1 right flank and tenderness wrapping around that dermatome into right pelvis       ASSESSMENT & PLAN   Assessment & Plan Vesicular rash I requested a swab of the base of the ruptured bulla on her back around T12-L1 with PCR to confirm the suspected diagnosis of shingles and encouraged her again continue taking the treatment as planned.  I did offer opioid pain management but she declined.  She will avoid NSAIDs and continue to manage with gabapentin  and lidocaine  and complete the course of valacyclovir  she was advised to consider herself contagious until the lesion has scabbed over Elevated LDL cholesterol level She is out of Repatha  and I agreed to refill Tremor She is out of the propranolol  and I agreed to refill I did  offer to go to the long-acting but she said the insurance does not cover it well  ORDER ASSOCIATIONS  #   DIAGNOSIS / CONDITION ICD-10 ENCOUNTER ORDER     ICD-10-CM   1. Vesicular rash  R23.8 Varicella-zoster by PCR    2. Elevated LDL cholesterol level  E78.00 Evolocumab  (REPATHA  SURECLICK) 140 MG/ML SOAJ    3. Tremor  R25.1 propranolol  (INDERAL ) 10 MG tablet         Orders Placed in Encounter:   Lab Orders         Varicella-zoster by PCR     Meds ordered this encounter  Medications   Evolocumab  (REPATHA  SURECLICK) 140 MG/ML SOAJ    Sig: Inject 140 mg into the skin every 14 (fourteen) days.    Dispense:  6 mL    Refill:  4   propranolol  (INDERAL ) 10 MG tablet    Sig: Take 1 tablet (10 mg total) by mouth 3 (three) times daily.    Dispense:  270 tablet    Refill:  3      This document was synthesized by artificial intelligence (Abridge) using HIPAA-compliant recording of the clinical interaction;   We discussed the use of AI scribe software for clinical note transcription with the patient, who gave verbal consent to proceed. additional Info: This encounter employed state-of-the-art, real-time, collaborative documentation. The patient actively reviewed and assisted in updating their electronic medical record on a shared screen, ensuring transparency and facilitating joint problem-solving for the problem  list, overview, and plan. This approach promotes accurate, informed care. The treatment plan was discussed and reviewed in detail, including medication safety, potential side effects, and all patient questions. We confirmed understanding and comfort with the plan. Follow-up instructions were established, including contacting the office for any concerns, returning if symptoms worsen, persist, or new symptoms develop, and precautions for potential emergency department visits.

## 2024-10-10 ENCOUNTER — Telehealth: Payer: Self-pay

## 2024-10-10 ENCOUNTER — Other Ambulatory Visit: Payer: Self-pay | Admitting: Internal Medicine

## 2024-10-10 ENCOUNTER — Other Ambulatory Visit (HOSPITAL_BASED_OUTPATIENT_CLINIC_OR_DEPARTMENT_OTHER): Payer: Self-pay

## 2024-10-10 ENCOUNTER — Other Ambulatory Visit (HOSPITAL_COMMUNITY): Payer: Self-pay

## 2024-10-10 ENCOUNTER — Encounter: Payer: Self-pay | Admitting: Internal Medicine

## 2024-10-10 ENCOUNTER — Ambulatory Visit: Admitting: Internal Medicine

## 2024-10-10 DIAGNOSIS — E7800 Pure hypercholesterolemia, unspecified: Secondary | ICD-10-CM

## 2024-10-10 MED ORDER — REPATHA SURECLICK 140 MG/ML ~~LOC~~ SOAJ
140.0000 mg | SUBCUTANEOUS | 2 refills | Status: AC
  Filled 2024-10-10: qty 2, 28d supply, fill #0

## 2024-10-10 NOTE — Telephone Encounter (Signed)
 Pharmacy Patient Advocate Encounter   Received notification from Onbase that prior authorization for Repatha  SureClick 140MG /ML auto-injectors is required/requested.   Insurance verification completed.   The patient is insured through [facility] ADVANT[employer]/RX ADVANCE.   Per test claim: PA required; PA started via CoverMyMeds. KEY [id number] . Waiting for clinical questions to populate.

## 2024-10-10 NOTE — Assessment & Plan Note (Signed)
 She is out of the propranolol  and I agreed to refill I did offer to go to the long-acting but she said the insurance does not cover it well

## 2024-10-10 NOTE — Telephone Encounter (Signed)
 Clinical questions have been answered and PA submitted. PA currently Pending.

## 2024-10-10 NOTE — Patient Instructions (Signed)
 It was a pleasure seeing you today! Your health and satisfaction are our top priorities.  Laura Cone, MD     Your Providers PCP: Farrell Laura MATSU, MD,  [phone]) Referring Provider: Cone Laura MATSU, MD,  [phone]) Care Team Provider: Donnald Charleston, MD,  [phone]) Care Team Provider: Porter Andrez JONELLE DEVONNA Care Team Provider: Kemp Coke,  [phone]) Care Team Provider: Caresse Cough, MD,  [phone]) Care Team Provider: Evonnie Asberry RAMAN, DO,  [phone]) Care Team Provider: Camella Delroy SQUIBB, MD Care Team Provider: Gerome Charleston, MD Care Team Provider: Unice Pac, MD,  [phone]) Care Team Provider: Pandora Cadet, [facility] Care Team Provider: Dolphus Reiter, MD,  [phone]) Care Team Provider: Lonni Slain, MD,  [phone]) Care Team Provider: Fadeyi, Oluwatoyin Alaba, NP,  [phone]) Care Team Provider: Fadeyi, Oluwatoyin Alaba, NP,  [phone])  NEXT STEPS: [x]  Early Intervention: Schedule sooner appointment, call our on-call services, or go to emergency room if there is any significant Increase in pain or discomfort New or worsening symptoms Sudden or severe changes in your health [x]  Flexible Follow-Up: We recommend a No follow-ups on file. for optimal routine care. This allows for progress monitoring and treatment adjustments. [x]  Preventive Care: Schedule your annual preventive care visit! It's typically covered by insurance and helps identify potential health issues early. [x]  Lab & X-ray Appointments: Incomplete tests scheduled today, or call to schedule. X-rays: [facility] (M-F, 8:30am-noon or 1pm-5pm). [x]  Medical Information Release: Sign a release form at front desk to obtain relevant medical information we don't have.  MAKING THE MOST OF OUR FOCUSED 20 MINUTE APPOINTMENTS: [x]   Clearly state your top concerns at the beginning of the visit to focus our discussion [x]   If you anticipate  you will need more time, please inform the front desk during scheduling - we can book multiple appointments in the same week. [x]   If you have transportation problems- use our convenient video appointments or ask about transportation support. [x]   We can get down to business faster if you use MyChart to update information before the visit and submit non-urgent questions before your visit. Thank you for taking the time to provide details through MyChart.  Let our nurse know and she can import this information into your encounter documents.  Arrival and Wait Times: [x]   Arriving on time ensures that everyone receives prompt attention. [x]   Early morning (8a) and afternoon (1p) appointments tend to have shortest wait times. [x]   Unfortunately, we cannot delay appointments for late arrivals or hold slots during phone calls.  Getting Answers and Following Up [x]   Simple Questions & Concerns: For quick questions or basic follow-up after your visit, reach us  at [phone] or MyChart messaging. [x]   Complex Concerns: If your concern is more complex, scheduling an appointment might be best. Discuss this with the staff to find the most suitable option. [x]   Lab & Imaging Results: We'll contact you directly if results are abnormal or you don't use MyChart. Most normal results will be on MyChart within 2-3 business days, with a review message from Dr. Cone. Haven't heard back in 2 weeks? Need results sooner? Contact us  at [phone]. [x]   Referrals: Our referral coordinator will manage specialist referrals. The specialist's office should contact you within 2 weeks to schedule an appointment. Call us  if you haven't heard from them after 2 weeks.  Staying Connected [x]   MyChart: Activate your MyChart for the fastest way to access results and message us . See the last page of  this paperwork for instructions on how to activate.  Bring to Your Next Appointment [x]   Medications: Please bring all your  medication bottles to your next appointment to ensure we have an accurate record of your prescriptions. [x]   Health Diaries: If you're monitoring any health conditions at home, keeping a diary of your readings can be very helpful for discussions at your next appointment.  Billing [x]   X-ray & Lab Orders: These are billed by separate companies. Contact the invoicing company directly for questions or concerns. [x]   Visit Charges: Discuss any billing inquiries with our administrative services team.  Your Satisfaction Matters [x]   Share Your Experience: We strive for your satisfaction! If you have any complaints, or preferably compliments, please let Dr. Jesus know directly or contact our Practice Administrators, Manuelita Rubin or [employer], by asking at the front desk.   Reviewing Your Records [x]   Review this early draft of your clinical encounter notes below and the final encounter summary tomorrow on MyChart after its been completed.  All orders placed so far are visible here: Vesicular rash -     Varicella-zoster by PCR  Elevated LDL cholesterol level -     Repatha  SureClick; Inject 140 mg into the skin every 14 (fourteen) days.  Dispense: 6 mL; Refill: 4  Tremor -     Propranolol  HCl; Take 1 tablet (10 mg total) by mouth 3 (three) times daily.  Dispense: 270 tablet; Refill: 3

## 2024-10-12 ENCOUNTER — Other Ambulatory Visit (HOSPITAL_COMMUNITY): Payer: Self-pay

## 2024-10-12 NOTE — Telephone Encounter (Signed)
 Pharmacy Patient Advocate Encounter  Received notification from [facility] [employer] that Prior Authorization for Repatha  SureClick 140MG /ML auto-injectors has been APPROVED from 10/10/24 to 10/10/25. Ran test claim, Copay is $318.12 (3 MONTH SUPPLY). This test claim was processed through [facility]- copay amounts may vary at other pharmacies due to pharmacy/plan contracts, or as the patient moves through the different stages of their insurance plan.   PA #/Case ID/Reference #: [id number]

## 2024-10-15 ENCOUNTER — Ambulatory Visit: Payer: Self-pay | Admitting: Internal Medicine

## 2024-10-15 LAB — VARICELLA-ZOSTER BY PCR: VARICELLA ZOSTER VIRUS (VZV) DNA, QL RT PCR: NOT DETECTED

## 2024-10-16 ENCOUNTER — Other Ambulatory Visit (HOSPITAL_BASED_OUTPATIENT_CLINIC_OR_DEPARTMENT_OTHER): Payer: Self-pay

## 2024-10-18 ENCOUNTER — Ambulatory Visit

## 2024-10-19 ENCOUNTER — Ambulatory Visit: Payer: HMO | Attending: Rheumatology | Admitting: Rheumatology

## 2024-10-19 ENCOUNTER — Encounter: Payer: Self-pay | Admitting: Rheumatology

## 2024-10-19 VITALS — BP 104/70 | HR 71 | Temp 97.9°F | Resp 14 | Ht 62.0 in | Wt 139.2 lb

## 2024-10-19 DIAGNOSIS — R682 Dry mouth, unspecified: Secondary | ICD-10-CM | POA: Diagnosis not present

## 2024-10-19 DIAGNOSIS — N1330 Unspecified hydronephrosis: Secondary | ICD-10-CM

## 2024-10-19 DIAGNOSIS — N1831 Chronic kidney disease, stage 3a: Secondary | ICD-10-CM

## 2024-10-19 DIAGNOSIS — S32010A Wedge compression fracture of first lumbar vertebra, initial encounter for closed fracture: Secondary | ICD-10-CM | POA: Diagnosis not present

## 2024-10-19 DIAGNOSIS — E039 Hypothyroidism, unspecified: Secondary | ICD-10-CM

## 2024-10-19 DIAGNOSIS — M720 Palmar fascial fibromatosis [Dupuytren]: Secondary | ICD-10-CM | POA: Diagnosis not present

## 2024-10-19 DIAGNOSIS — M19072 Primary osteoarthritis, left ankle and foot: Secondary | ICD-10-CM

## 2024-10-19 DIAGNOSIS — I7300 Raynaud's syndrome without gangrene: Secondary | ICD-10-CM | POA: Diagnosis not present

## 2024-10-19 DIAGNOSIS — M19071 Primary osteoarthritis, right ankle and foot: Secondary | ICD-10-CM | POA: Diagnosis not present

## 2024-10-19 DIAGNOSIS — M19041 Primary osteoarthritis, right hand: Secondary | ICD-10-CM | POA: Diagnosis not present

## 2024-10-19 DIAGNOSIS — E1122 Type 2 diabetes mellitus with diabetic chronic kidney disease: Secondary | ICD-10-CM | POA: Diagnosis not present

## 2024-10-19 DIAGNOSIS — M5416 Radiculopathy, lumbar region: Secondary | ICD-10-CM

## 2024-10-19 DIAGNOSIS — N3946 Mixed incontinence: Secondary | ICD-10-CM

## 2024-10-19 DIAGNOSIS — M19042 Primary osteoarthritis, left hand: Secondary | ICD-10-CM

## 2024-10-19 DIAGNOSIS — M8589 Other specified disorders of bone density and structure, multiple sites: Secondary | ICD-10-CM | POA: Diagnosis not present

## 2024-10-19 DIAGNOSIS — D126 Benign neoplasm of colon, unspecified: Secondary | ICD-10-CM

## 2024-10-19 DIAGNOSIS — K449 Diaphragmatic hernia without obstruction or gangrene: Secondary | ICD-10-CM

## 2024-10-19 DIAGNOSIS — K219 Gastro-esophageal reflux disease without esophagitis: Secondary | ICD-10-CM

## 2024-10-19 DIAGNOSIS — Z8669 Personal history of other diseases of the nervous system and sense organs: Secondary | ICD-10-CM

## 2024-10-19 DIAGNOSIS — L719 Rosacea, unspecified: Secondary | ICD-10-CM

## 2024-10-19 DIAGNOSIS — M1712 Unilateral primary osteoarthritis, left knee: Secondary | ICD-10-CM

## 2024-10-19 DIAGNOSIS — G250 Essential tremor: Secondary | ICD-10-CM

## 2024-10-19 DIAGNOSIS — E782 Mixed hyperlipidemia: Secondary | ICD-10-CM

## 2024-10-19 DIAGNOSIS — Z947 Corneal transplant status: Secondary | ICD-10-CM

## 2024-10-19 NOTE — Patient Instructions (Addendum)
 Hand Exercises Hand exercises can be helpful for almost anyone. They can strengthen your hands and improve flexibility and movement. The exercises can also increase blood flow to the hands. These results can make your work and daily tasks easier for you. Hand exercises can be especially helpful for people who have joint pain from arthritis or nerve damage from using their hands over and over. These exercises can also help people who injure a hand. Exercises Most of these hand exercises are gentle stretching and motion exercises. It is usually safe to do them often throughout the day. Warming up your hands before exercise may help reduce stiffness. You can do this with gentle massage or by placing your hands in warm water for 10-15 minutes. It is normal to feel some stretching, pulling, tightness, or mild discomfort when you begin new exercises. In time, this will improve. Remember to always be careful and stop right away if you feel sudden, very bad pain or your pain gets worse. You want to get better and be safe. Ask your health care provider which exercises are safe for you. Do exercises exactly as told by your provider and adjust them as told. Do not begin these exercises until told by your provider. Knuckle bend or claw fist  Stand or sit with your arm, hand, and all five fingers pointed straight up. Make sure to keep your wrist straight. Gently bend your fingers down toward your palm until the tips of your fingers are touching your palm. Keep your big knuckle straight and only bend the small knuckles in your fingers. Hold this position for 10 seconds. Straighten your fingers back to your starting position. Repeat this exercise 5-10 times with each hand. Full finger fist  Stand or sit with your arm, hand, and all five fingers pointed straight up. Make sure to keep your wrist straight. Gently bend your fingers into your palm until the tips of your fingers are touching the middle of your  palm. Hold this position for 10 seconds. Extend your fingers back to your starting position, stretching every joint fully. Repeat this exercise 5-10 times with each hand. Straight fist  Stand or sit with your arm, hand, and all five fingers pointed straight up. Make sure to keep your wrist straight. Gently bend your fingers at the big knuckle, where your fingers meet your hand, and at the middle knuckle. Keep the knuckle at the tips of your fingers straight and try to touch the bottom of your palm. Hold this position for 10 seconds. Extend your fingers back to your starting position, stretching every joint fully. Repeat this exercise 5-10 times with each hand. Tabletop  Stand or sit with your arm, hand, and all five fingers pointed straight up. Make sure to keep your wrist straight. Gently bend your fingers at the big knuckle, where your fingers meet your hand, as far down as you can. Keep the small knuckles in your fingers straight. Think of forming a tabletop with your fingers. Hold this position for 10 seconds. Extend your fingers back to your starting position, stretching every joint fully. Repeat this exercise 5-10 times with each hand. Finger spread  Place your hand flat on a table with your palm facing down. Make sure your wrist stays straight. Spread your fingers and thumb apart from each other as far as you can until you feel a gentle stretch. Hold this position for 10 seconds. Bring your fingers and thumb tight together again. Hold this position for 10 seconds. Repeat  this exercise 5-10 times with each hand. Making circles  Stand or sit with your arm, hand, and all five fingers pointed straight up. Make sure to keep your wrist straight. Make a circle by touching the tip of your thumb to the tip of your index finger. Hold for 10 seconds. Then open your hand wide. Repeat this motion with your thumb and each of your fingers. Repeat this exercise 5-10 times with each hand. Thumb  motion  Sit with your forearm resting on a table and your wrist straight. Your thumb should be facing up toward the ceiling. Keep your fingers relaxed as you move your thumb. Lift your thumb up as high as you can toward the ceiling. Hold for 10 seconds. Bend your thumb across your palm as far as you can, reaching the tip of your thumb for the small finger (pinkie) side of your palm. Hold for 10 seconds. Repeat this exercise 5-10 times with each hand. Grip strengthening  Hold a stress ball or other soft ball in the middle of your hand. Slowly increase the pressure, squeezing the ball as much as you can without causing pain. Think of bringing the tips of your fingers into the middle of your palm. All of your finger joints should bend when doing this exercise. Hold your squeeze for 10 seconds, then relax. Repeat this exercise 5-10 times with each hand. Contact a health care provider if: Your hand pain or discomfort gets much worse when you do an exercise. Your hand pain or discomfort does not improve within 2 hours after you exercise. If you have either of these problems, stop doing these exercises right away. Do not do them again unless your provider says that you can. Get help right away if: You develop sudden, severe hand pain or swelling. If this happens, stop doing these exercises right away. Do not do them again unless your provider says that you can. This information is not intended to replace advice given to you by your health care provider. Make sure you discuss any questions you have with your health care provider. Document Revised: 10/12/2022 Document Reviewed: 10/12/2022 [employer] Patient Education  2024 [employer]Low Back Sprain or Strain Rehab Ask your health care provider which exercises are safe for you. Do exercises exactly as told by your health care provider and adjust them as directed. It is normal to feel mild stretching, pulling, tightness, or discomfort as you do these  exercises. Stop right away if you feel sudden pain or your pain gets worse. Do not begin these exercises until told by your health care provider. Stretching and range-of-motion exercises These exercises warm up your muscles and joints and improve the movement and flexibility of your back. These exercises also help to relieve pain, numbness, and tingling. Lumbar rotation  Lie on your back on a firm bed or the floor with your knees bent. Straighten your arms out to your sides so each arm forms a 90-degree angle (right angle) with a side of your body. Slowly move (rotate) both of your knees to one side of your body until you feel a stretch in your lower back (lumbar). Try not to let your shoulders lift off the floor. Hold this position for __________ seconds. Tense your abdominal muscles and slowly move your knees back to the starting position. Repeat this exercise on the other side of your body. Repeat __________ times. Complete this exercise __________ times a day. Single knee to chest  Lie on your back on a firm  bed or the floor with both legs straight. Bend one of your knees. Use your hands to move your knee up toward your chest until you feel a gentle stretch in your lower back and buttock. Hold your leg in this position by holding on to the front of your knee. Keep your other leg as straight as possible. Hold this position for __________ seconds. Slowly return to the starting position. Repeat with your other leg. Repeat __________ times. Complete this exercise __________ times a day. Prone extension on elbows   Strengthening exercises These exercises build strength and endurance in your back. Endurance is the ability to use your muscles for a long time, even after they get tired. Pelvic tilt This exercise strengthens the muscles that lie deep in the abdomen. Lie on your back on a firm bed or the floor with your legs extended. Bend your knees so they are pointing toward the ceiling and  your feet are flat on the floor. Tighten your lower abdominal muscles to press your lower back against the floor. This motion will tilt your pelvis so your tailbone points up toward the ceiling instead of pointing to your feet or the floor. To help with this exercise, you may place a small towel under your lower back and try to push your back into the towel. Hold this position for __________ seconds. Let your muscles relax completely before you repeat this exercise. Repeat __________ times. Complete this exercise __________ times a day. Alternating arm and leg raises  Get on your hands and knees on a firm surface. If you are on a hard floor, you may want to use padding, such as an exercise mat, to cushion your knees. Line up your arms and legs. Your hands should be directly below your shoulders, and your knees should be directly below your hips. Lift your left leg behind you. At the same time, raise your right arm and straighten it in front of you. Do not lift your leg higher than your hip. Do not lift your arm higher than your shoulder. Keep your abdominal and back muscles tight. Keep your hips facing the ground. Do not arch your back. Keep your balance carefully, and do not hold your breath. Hold this position for __________ seconds. Slowly return to the starting position. Repeat with your right leg and your left arm. Repeat __________ times. Complete this exercise __________ times a day. Abdominal set with straight leg raise  Lie on your back on a firm bed or the floor. Bend one of your knees and keep your other leg straight. Tense your abdominal muscles and lift your straight leg up, 4-6 inches (10-15 cm) off the ground. Keep your abdominal muscles tight and hold this position for __________ seconds. Do not hold your breath. Do not arch your back. Keep it flat against the ground. Keep your abdominal muscles tense as you slowly lower your leg back to the starting position. Repeat  with your other leg. Repeat __________ times. Complete this exercise __________ times a day. Single leg lower with bent knees Lie on your back on a firm bed or the floor. Tense your abdominal muscles and lift your feet off the floor, one foot at a time, so your knees and hips are bent in 90-degree angles (right angles). Your knees should be over your hips and your lower legs should be parallel to the floor. Keeping your abdominal muscles tense and your knee bent, slowly lower one of your legs so your toe touches the  ground. Lift your leg back up to return to the starting position. Do not hold your breath. Do not let your back arch. Keep your back flat against the ground. Repeat with your other leg. Repeat __________ times. Complete this exercise __________ times a day. Posture and body mechanics Good posture and healthy body mechanics can help to relieve stress in your body's tissues and joints. Body mechanics refers to the movements and positions of your body while you do your daily activities. Posture is part of body mechanics. Good posture means: Your spine is in its natural S-curve position (neutral). Your shoulders are pulled back slightly. Your head is not tipped forward (neutral). Follow these guidelines to improve your posture and body mechanics in your everyday activities. Standing  When standing, keep your spine neutral and your feet about hip-width apart. Keep a slight bend in your knees. Your ears, shoulders, and hips should line up. When you do a task in which you stand in one place for a long time, place one foot up on a stable object that is 2-4 inches (5-10 cm) high, such as a footstool. This helps keep your spine neutral. Sitting  When sitting, keep your spine neutral and keep your feet flat on the floor. Use a footrest, if necessary, and keep your thighs parallel to the floor. Avoid rounding your shoulders, and avoid tilting your head forward. When working at a desk or a  computer, keep your desk at a height where your hands are slightly lower than your elbows. Slide your chair under your desk so you are close enough to maintain good posture. When working at a computer, place your monitor at a height where you are looking straight ahead and you do not have to tilt your head forward or downward to look at the screen. Resting When lying down and resting, avoid positions that are most painful for you. If you have pain with activities such as sitting, bending, stooping, or squatting, lie in a position in which your body does not bend very much. For example, avoid curling up on your side with your arms and knees near your chest (fetal position). If you have pain with activities such as standing for a long time or reaching with your arms, lie with your spine in a neutral position and bend your knees slightly. Try the following positions: Lying on your side with a pillow between your knees. Lying on your back with a pillow under your knees. Lifting  When lifting objects, keep your feet at least shoulder-width apart and tighten your abdominal muscles. Bend your knees and hips and keep your spine neutral. It is important to lift using the strength of your legs, not your back. Do not lock your knees straight out. Always ask for help to lift heavy or awkward objects. This information is not intended to replace advice given to you by your health care provider. Make sure you discuss any questions you have with your health care provider. Document Revised: 01/31/2023 Document Reviewed: 12/15/2020 Elsevier Patient Education  2024 [employer].

## 2024-10-21 ENCOUNTER — Ambulatory Visit: Payer: Self-pay | Admitting: Rheumatology

## 2024-10-21 LAB — COMPREHENSIVE METABOLIC PANEL WITH GFR
AG Ratio: 1.9 (calc) (ref 1.0–2.5)
ALT: 34 U/L — ABNORMAL HIGH (ref 6–29)
AST: 30 U/L (ref 10–35)
Albumin: 4.3 g/dL (ref 3.6–5.1)
Alkaline phosphatase (APISO): 68 U/L (ref 37–153)
BUN/Creatinine Ratio: 28 (calc) — ABNORMAL HIGH (ref 6–22)
BUN: 34 mg/dL — ABNORMAL HIGH (ref 7–25)
CO2: 22 mmol/L (ref 20–32)
Calcium: 9.1 mg/dL (ref 8.6–10.4)
Chloride: 108 mmol/L (ref 98–110)
Creat: 1.2 mg/dL — ABNORMAL HIGH (ref 0.60–0.95)
Globulin: 2.3 g/dL (ref 1.9–3.7)
Glucose, Bld: 68 mg/dL (ref 65–99)
Potassium: 4.6 mmol/L (ref 3.5–5.3)
Sodium: 140 mmol/L (ref 135–146)
Total Bilirubin: 0.2 mg/dL (ref 0.2–1.2)
Total Protein: 6.6 g/dL (ref 6.1–8.1)
eGFR: 46 mL/min/1.73m2 — ABNORMAL LOW

## 2024-10-21 LAB — CBC WITH DIFFERENTIAL/PLATELET
Absolute Lymphocytes: 2148 {cells}/uL (ref 850–3900)
Absolute Monocytes: 747 {cells}/uL (ref 200–950)
Basophils Absolute: 26 {cells}/uL (ref 0–200)
Basophils Relative: 0.2 %
Eosinophils Absolute: 105 {cells}/uL (ref 15–500)
Eosinophils Relative: 0.8 %
HCT: 39.4 % (ref 35.9–46.0)
Hemoglobin: 12.7 g/dL (ref 11.7–15.5)
MCH: 31.5 pg (ref 27.0–33.0)
MCHC: 32.2 g/dL (ref 31.6–35.4)
MCV: 97.8 fL (ref 81.4–101.7)
MPV: 10.7 fL (ref 7.5–12.5)
Monocytes Relative: 5.7 %
Neutro Abs: 10074 {cells}/uL — ABNORMAL HIGH (ref 1500–7800)
Neutrophils Relative %: 76.9 %
Platelets: 193 Thousand/uL (ref 140–400)
RBC: 4.03 Million/uL (ref 3.80–5.10)
RDW: 13.6 % (ref 11.0–15.0)
Total Lymphocyte: 16.4 %
WBC: 13.1 Thousand/uL — ABNORMAL HIGH (ref 3.8–10.8)

## 2024-10-21 LAB — ANTI-NUCLEAR AB-TITER (ANA TITER): ANA Titer 1: 1:80 {titer} — ABNORMAL HIGH

## 2024-10-21 LAB — ANTI-DNA ANTIBODY, DOUBLE-STRANDED: ds DNA Ab: <1 [IU]/mL

## 2024-10-21 LAB — C3 AND C4
C3 Complement: 132 mg/dL (ref 83–193)
C4 Complement: 13 mg/dL — ABNORMAL LOW (ref 15–57)

## 2024-10-21 LAB — SEDIMENTATION RATE: Sed Rate: 6 mm/h (ref 0–30)

## 2024-10-21 LAB — ANA: Anti Nuclear Antibody (ANA): POSITIVE — AB

## 2024-10-21 NOTE — Progress Notes (Signed)
 White cell count is elevated.  Please ask patient if she had recent oral prednisone  or injectable prednisone  or recent infection.  Creatinine is elevated most likely due to the use of Celebrex .  Patient should stop Celebrex  and also avoid all NSAIDs.  LFTs are mildly elevated most likely due to the combination of steroids and statin use.  Double-stranded ENA negative, C4 low, sed rate normal.  ANA pending.  Labs do not indicate an autoimmune disease flare.  Please forward results to her PCP.

## 2024-10-22 ENCOUNTER — Other Ambulatory Visit: Payer: Self-pay | Admitting: Internal Medicine

## 2024-10-22 ENCOUNTER — Telehealth: Payer: Self-pay | Admitting: Internal Medicine

## 2024-10-22 ENCOUNTER — Ambulatory Visit: Admitting: Internal Medicine

## 2024-10-22 DIAGNOSIS — S32010A Wedge compression fracture of first lumbar vertebra, initial encounter for closed fracture: Secondary | ICD-10-CM

## 2024-10-22 MED ORDER — METHOCARBAMOL 500 MG PO TABS: 500.0000 mg | ORAL_TABLET | Freq: Four times a day (QID) | ORAL | 5 refills | Status: AC

## 2024-10-22 NOTE — Progress Notes (Signed)
 ANA is low titer positive and stable.  Correction on the previous message LFTs are mildly elevated most likely due to the combination of NSAIDs and a statin use (not steroids)

## 2024-10-22 NOTE — Telephone Encounter (Signed)
" °  Encourage patient to contact the pharmacy for refills or they can request refills through [facility]  LAST APPOINTMENT DATE:  10/09/24  NEXT APPOINTMENT DATE:  MEDICATION: methocarbamol  (ROBAXIN ) 500 MG tablet   Is the patient out of medication? yes  PHARMACY:  [id number] - [place], [place] - [phone] Phone: [phone]  Fax: [phone]      Let patient know to contact pharmacy at the end of the day to make sure medication is ready.  Please notify patient to allow 48-72 hours to process  "

## 2024-10-23 ENCOUNTER — Inpatient Hospital Stay: Admission: RE | Admit: 2024-10-23 | Discharge: 2024-10-23 | Attending: Internal Medicine

## 2024-10-23 DIAGNOSIS — Z1231 Encounter for screening mammogram for malignant neoplasm of breast: Secondary | ICD-10-CM

## 2024-10-25 ENCOUNTER — Ambulatory Visit: Payer: Self-pay | Admitting: Internal Medicine

## 2024-10-26 ENCOUNTER — Ambulatory Visit

## 2024-10-27 ENCOUNTER — Other Ambulatory Visit: Payer: Self-pay | Admitting: Internal Medicine

## 2024-10-27 DIAGNOSIS — G8929 Other chronic pain: Secondary | ICD-10-CM

## 2024-10-27 DIAGNOSIS — S3992XD Unspecified injury of lower back, subsequent encounter: Secondary | ICD-10-CM

## 2024-10-31 ENCOUNTER — Other Ambulatory Visit: Payer: Self-pay | Admitting: Physician Assistant

## 2024-10-31 NOTE — Telephone Encounter (Signed)
 Dr. Jesus, please advise if okay to continue Gabapentin ?

## 2024-11-05 ENCOUNTER — Ambulatory Visit: Admitting: Internal Medicine

## 2024-11-09 ENCOUNTER — Encounter: Payer: Self-pay | Admitting: Internal Medicine

## 2024-11-09 ENCOUNTER — Ambulatory Visit: Admitting: Internal Medicine

## 2024-11-09 ENCOUNTER — Ambulatory Visit

## 2024-11-09 VITALS — BP 108/66 | HR 78 | Temp 98.0°F | Ht 62.0 in | Wt 138.4 lb

## 2024-11-09 DIAGNOSIS — M545 Low back pain, unspecified: Secondary | ICD-10-CM

## 2024-11-09 DIAGNOSIS — M8000XS Age-related osteoporosis with current pathological fracture, unspecified site, sequela: Secondary | ICD-10-CM

## 2024-11-09 DIAGNOSIS — S32010A Wedge compression fracture of first lumbar vertebra, initial encounter for closed fracture: Secondary | ICD-10-CM | POA: Diagnosis not present

## 2024-11-09 DIAGNOSIS — G8929 Other chronic pain: Secondary | ICD-10-CM

## 2024-11-09 MED ORDER — PREDNISONE 20 MG PO TABS: ORAL_TABLET | ORAL | 0 refills | Status: AC

## 2024-11-09 MED ORDER — OXYCODONE-ACETAMINOPHEN 10-325 MG PO TABS: 1.0000 | ORAL_TABLET | Freq: Three times a day (TID) | ORAL | 0 refills | Status: AC | PRN

## 2024-11-09 MED ORDER — JOURNAVX 50 MG PO TABS: 1.0000 | ORAL_TABLET | Freq: Two times a day (BID) | ORAL | 5 refills | Status: AC | PRN

## 2024-11-09 NOTE — Progress Notes (Unsigned)
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  Yellow;Lt. Yellow;Straw;Dark Yellow;Amber;Green;Red;Brown Final   APPearance 10/31/2023 Turbid (A)  Clear;Turbid;Slightly Cloudy;Cloudy Final   Specific Gravity, Urine 10/31/2023 1.015  1.000 - 1.030 Final   pH 10/31/2023 6.0  5.0 - 8.0 Final   Total Protein, Urine 10/31/2023 NEGATIVE  Negative Final   Urine Glucose 10/31/2023 NEGATIVE  Negative Final   Ketones, ur 10/31/2023 NEGATIVE  Negative Final   Bilirubin Urine 10/31/2023 NEGATIVE  Negative Final   Hgb urine dipstick 10/31/2023 NEGATIVE  Negative Final   Urobilinogen, UA 10/31/2023 0.2  0.0 - 1.0 Final   Leukocytes,Ua 10/31/2023 MODERATE (A)  Negative Final   Nitrite 10/31/2023 NEGATIVE  Negative Final   WBC, UA 10/31/2023 TNTC(>50/hpf) (A)  0-2/hpf Final   RBC / HPF 10/31/2023 none seen  0-2/hpf Final   Squamous Epithelial / HPF 10/31/2023 Few(5-10/hpf) (A)  Rare(0-4/hpf) Final   Bacteria, UA 10/31/2023 Many(>50/hpf) (A)  None Final   TSH 10/31/2023 1.430  0.450 - 4.500 uIU/mL Final   Free T4 10/31/2023 1.14  0.82 - 1.77 ng/dL Final    Sodium 98/79/7974 141  135 - 145 mEq/L Final   Potassium 10/31/2023 4.2  3.5 - 5.1 mEq/L Final   Chloride 10/31/2023 104  96 - 112 mEq/L Final   CO2 10/31/2023 26  19 - 32 mEq/L Final   Glucose, Bld 10/31/2023 69 (L)  70 - 99 mg/dL Final   BUN 98/79/7974 14  6 -  23 mg/dL Final   Creatinine, Ser 10/31/2023 0.92  0.40 - 1.20 mg/dL Final   GFR 98/79/7974 59.06 (L)  >60.00 mL/min Final   Calcium  10/31/2023 9.9  8.4 - 10.5 mg/dL Final   VITD 98/79/7974 35.99  30.00 - 100.00 ng/mL Final  There may be more visits with results that are not included.  No image results found. MM 3D SCREENING MAMMOGRAM BILATERAL BREAST Result Date: 10/25/2024 CLINICAL DATA:  Screening. EXAM: DIGITAL SCREENING BILATERAL MAMMOGRAM WITH TOMOSYNTHESIS AND CAD TECHNIQUE: Bilateral screening digital craniocaudal and mediolateral oblique mammograms were obtained. Bilateral screening digital breast tomosynthesis was performed. The images were evaluated with computer-aided detection. COMPARISON:  Previous exam(s). ACR Breast Density Category d: The breasts are extremely dense, which lowers the sensitivity of mammography. FINDINGS: There are no findings suspicious for malignancy. IMPRESSION: No mammographic evidence of malignancy. A result letter of this screening mammogram will be mailed directly to the patient. RECOMMENDATION: Screening mammogram in one year. (Code:[id number]) BI-RADS CATEGORY  1: Negative. Electronically Signed   By: Dina  Arceo M.D.   On: 10/25/2024 05:06         ASSESSMENT & PLAN   Assessment & Plan Age-related osteoporosis with current pathological fracture, sequela Acute bilateral low back pain without sciatica Closed compression fracture of body of L1 vertebra (HCC) Chronic intractable pain Acute low back pain is likely due to inflammation from the osteoporotic L1 compression fracture, possibly worsened by recent massage therapy. There is no evidence of spinal misalignment or disc issues, but the pain  is severe and persistent, affecting daily activities. A lumbar complete x-ray is ordered to assess spinal alignment. Prednisone  is prescribed for a few days to reduce inflammation. She is referred to physical medicine and rehabilitation for further evaluation and management, and to [facility] for advanced pain management options. Acupuncture is recommended to improve circulation and reduce inflammation. The potential use of inversion tables for temporary relief is discussed, with caution due to limited efficacy. Oxycodone  is prescribed for severe pain management as a last resort, with caution regarding sedation and addiction potential.  Chronic pain significantly impacts her quality of life. Previous interventions, including nerve blocks and steroid injections, have been ineffective. Current pain management strategies are limited by insurance constraints and lack of access to advanced techniques. She is referred to [facility] for advanced pain management options, including potential spinal stimulators. The use of Jurnavix as a non-narcotic, non-addictive pain medication is discussed, pending insurance approval. The current pain management regimen with tramadol  and gabapentin  continues as needed. Exploration of alternative therapies such as acupuncture and inversion tables for symptomatic relief is encouraged.   ORDER ASSOCIATIONS  #   DIAGNOSIS / CONDITION ICD-10 ENCOUNTER ORDER     ICD-10-CM   1. Age-related osteoporosis with current pathological fracture, sequela  M80.00XS DG Lumbar Spine Complete    predniSONE  (DELTASONE ) 20 MG tablet    Ambulatory referral to Physical Medicine Rehab    oxyCODONE -acetaminophen  (PERCOCET) 10-325 MG tablet    Ambulatory referral to Pain Clinic    Suzetrigine  (JOURNAVX ) 50 MG TABS    2. Acute bilateral low back pain without sciatica  M54.50 DG Lumbar Spine Complete    predniSONE  (DELTASONE ) 20 MG tablet    Ambulatory referral to Physical Medicine  Rehab    oxyCODONE -acetaminophen  (PERCOCET) 10-325 MG tablet    Ambulatory referral to Pain Clinic    Suzetrigine  (JOURNAVX ) 50 MG TABS    3. Closed compression fracture of body of L1 vertebra (HCC)  S32.010A oxyCODONE -acetaminophen  (PERCOCET) 10-325 MG tablet    Ambulatory referral to Pain Clinic    Suzetrigine  (JOURNAVX ) 50 MG TABS    4. Chronic intractable pain  G89.29 oxyCODONE -acetaminophen  (PERCOCET) 10-325 MG tablet    Ambulatory referral to Pain Clinic    Suzetrigine  (JOURNAVX ) 50 MG TABS         Orders Placed in Encounter:   Imaging Orders         DG Lumbar Spine Complete     Referral Orders         Ambulatory referral to Physical Medicine Rehab         Ambulatory referral to Pain Clinic     Meds ordered this encounter  Medications   predniSONE  (DELTASONE ) 20 MG tablet    Sig: Take 2 pills for 3 days, 1 pill for 4 days    Dispense:  10 tablet    Refill:  0   oxyCODONE -acetaminophen  (PERCOCET) 10-325 MG tablet    Sig: Take 1 tablet by mouth every 8 (eight) hours as needed for up to 5 days for pain. Start with just half tablet. Sedating.    Dispense:  15 tablet    Refill:  0   Suzetrigine  (JOURNAVX ) 50 MG TABS    Sig: Take 1 tablet by mouth 2 (two) times daily as needed.    Dispense:  60 tablet    Refill:  5    Orders Placed This Encounter  Procedures   DG Lumbar Spine Complete    Standing Status:   Future    Number of Occurrences:   1    Expiration Date:   01/07/2026    Preferred imaging location?:   [facility]    Reason for exam::   lumbar pain severe chronic compression frx, worsened after a massage.    Release to patient:   Immediate   Ambulatory referral to Physical Medicine Rehab    Referral Priority:   Urgent    Referral Type:   Rehabilitation    Referral Reason:   Specialty Services Required    Requested Specialty:   Physical Medicine and Rehabilitation    Number of Visits Requested:   1   Ambulatory referral to Pain Clinic    Referral  Priority:   Urgent    Referral Type:   Consultation    Referral Reason:   Specialty Services Required    Requested Specialty:   Pain Medicine    Number of Visits Requested:   1        This document was synthesized by artificial intelligence ([employer]bridge) using HIPAA-compliant recording of the clinical interaction;   We discussed the use of AI scribe software for clinical note transcription with the patient, who gave verbal consent to proceed. additional Info: This encounter employed state-of-the-art, real-time, collaborative documentation. The patient actively reviewed and assisted in updating their electronic medical record on a shared screen, ensuring transparency and facilitating joint problem-solving for the problem list, overview, and plan. This approach promotes accurate, informed care. The treatment plan was discussed and reviewed in detail, including medication safety, potential side effects, and all patient questions. We confirmed understanding and comfort with the plan. Follow-up instructions were established, including contacting the office for any concerns, returning if symptoms worsen, persist, or new symptoms develop, and precautions for potential emergency department visits.

## 2024-11-10 ENCOUNTER — Other Ambulatory Visit: Payer: Self-pay | Admitting: Internal Medicine

## 2024-11-10 DIAGNOSIS — G8929 Other chronic pain: Secondary | ICD-10-CM

## 2024-11-10 DIAGNOSIS — S32010A Wedge compression fracture of first lumbar vertebra, initial encounter for closed fracture: Secondary | ICD-10-CM

## 2024-11-11 NOTE — Assessment & Plan Note (Signed)
 Acute low back pain is likely due to inflammation from the osteoporotic L1 compression fracture, possibly worsened by recent massage therapy. There is no evidence of spinal misalignment or disc issues, but the pain is severe and persistent, affecting daily activities. A lumbar complete x-ray is ordered to assess spinal alignment. Prednisone  is prescribed for a few days to reduce inflammation. She is referred to physical medicine and rehabilitation for further evaluation and management, and to [facility] for advanced pain management options. Acupuncture is recommended to improve circulation and reduce inflammation. The potential use of inversion tables for temporary relief is discussed, with caution due to limited efficacy. Oxycodone  is prescribed for severe pain management as a last resort, with caution regarding sedation and addiction potential.  Chronic pain significantly impacts her quality of life. Previous interventions, including nerve blocks and steroid injections, have been ineffective. Current pain management strategies are limited by insurance constraints and lack of access to advanced techniques. She is referred to [facility] for advanced pain management options, including potential spinal stimulators. The use of Jurnavix as a non-narcotic, non-addictive pain medication is discussed, pending insurance approval. The current pain management regimen with tramadol  and gabapentin  continues as needed. Exploration of alternative therapies such as acupuncture and inversion tables for symptomatic relief is encouraged.

## 2024-11-11 NOTE — Patient Instructions (Signed)
 It was a pleasure seeing you today! Your health and satisfaction are our top priorities.  Bernardino Cone, MD  VISIT SUMMARY: During your visit, we discussed your worsening chronic back pain following a recent massage. We reviewed your history of treatments and medications, and we have developed a plan to address your acute low back pain and chronic pain syndrome.  YOUR PLAN: -OSTEOPOROTIC L1 COMPRESSION FRACTURE WITH ACUTE LOW BACK PAIN: You have an acute low back pain likely due to inflammation from an osteoporotic L1 compression fracture, which may have been worsened by the recent massage. We will perform a lumbar x-ray to check your spinal alignment. Prednisone  is prescribed to reduce inflammation. You are referred to physical medicine and rehabilitation for further evaluation and to [facility] for advanced pain management options. Acupuncture is recommended to improve circulation and reduce inflammation. We discussed the potential use of inversion tables for temporary relief, but with caution. Oxycodone  is prescribed for severe pain management as a last resort, with caution regarding sedation and addiction potential.  -CHRONIC PAIN SYNDROME: Your chronic pain significantly impacts your quality of life. Previous treatments have been ineffective, and current pain management strategies are limited by insurance constraints. You are referred to [facility] for advanced pain management options, including potential spinal stimulators. We discussed the use of Jurnavix as a non-narcotic, non-addictive pain medication, pending insurance approval. Your current pain management regimen with tramadol  and gabapentin  will continue as needed. We encourage exploring alternative therapies such as acupuncture and inversion tables for symptomatic relief.  INSTRUCTIONS: Please follow up with the lumbar x-ray as soon as possible. Schedule appointments with physical medicine and rehabilitation and [facility] for further evaluation and management. Continue taking your prescribed medications as directed. Consider trying acupuncture and inversion tables for additional relief. Be cautious with the use of oxycodone  and only use it as a last resort for severe pain.  Your Providers PCP: Cone Bernardino MATSU, MD,  [phone]) Referring Provider: Cone Bernardino MATSU, MD,  [phone]) Care Team Provider: Donnald Charleston, MD,  [phone]) Care Team Provider: Porter Andrez JONELLE DEVONNA Care Team Provider: Kemp Coke,  [phone]) Care Team Provider: Caresse Cough, MD,  [phone]) Care Team Provider: Evonnie Asberry RAMAN, DO,  [phone]) Care Team Provider: Camella Delroy SQUIBB, MD Care Team Provider: Gerome Charleston, MD Care Team Provider: Unice Pac, MD,  [phone]) Care Team Provider: Pandora Cadet, [facility] Care Team Provider: Dolphus Reiter, MD,  [phone]) Care Team Provider: Lonni Slain, MD,  [phone]) Care Team Provider: Fadeyi, Oluwatoyin Alaba, NP,  [phone]) Care Team Provider: Fadeyi, Oluwatoyin Alaba, NP,  [phone])  NEXT STEPS: [x]  Early Intervention: Schedule sooner appointment, call our on-call services, or go to emergency room if there is any significant Increase in pain or discomfort New or worsening symptoms Sudden or severe changes in your health [x]  Flexible Follow-Up: We recommend a No follow-ups on file. for optimal routine care. This allows for progress monitoring and treatment adjustments. [x]  Preventive Care: Schedule your annual preventive care visit! It's typically covered by insurance and helps identify potential health issues early. [x]  Lab & X-ray Appointments: Incomplete tests scheduled today, or call to schedule. X-rays: [facility] (M-F, 8:30am-noon or 1pm-5pm). [x]  Medical Information Release: Sign a release form at front desk to obtain relevant medical information we don't have.  MAKING THE MOST OF  OUR FOCUSED 20 MINUTE APPOINTMENTS: [x]   Clearly state your top concerns at the beginning of the visit to focus our  discussion [x]   If you anticipate you will need more time, please inform the front desk during scheduling - we can book multiple appointments in the same week. [x]   If you have transportation problems- use our convenient video appointments or ask about transportation support. [x]   We can get down to business faster if you use MyChart to update information before the visit and submit non-urgent questions before your visit. Thank you for taking the time to provide details through MyChart.  Let our nurse know and she can import this information into your encounter documents.  Arrival and Wait Times: [x]   Arriving on time ensures that everyone receives prompt attention. [x]   Early morning (8a) and afternoon (1p) appointments tend to have shortest wait times. [x]   Unfortunately, we cannot delay appointments for late arrivals or hold slots during phone calls.  Getting Answers and Following Up [x]   Simple Questions & Concerns: For quick questions or basic follow-up after your visit, reach us  at [phone] or MyChart messaging. [x]   Complex Concerns: If your concern is more complex, scheduling an appointment might be best. Discuss this with the staff to find the most suitable option. [x]   Lab & Imaging Results: We'll contact you directly if results are abnormal or you don't use MyChart. Most normal results will be on MyChart within 2-3 business days, with a review message from Dr. Jesus. Haven't heard back in 2 weeks? Need results sooner? Contact us  at [phone]. [x]   Referrals: Our referral coordinator will manage specialist referrals. The specialist's office should contact you within 2 weeks to schedule an appointment. Call us  if you haven't heard from them after 2 weeks.  Staying Connected [x]   MyChart: Activate your MyChart for the fastest way to access results and message us .  See the last page of this paperwork for instructions on how to activate.  Bring to Your Next Appointment [x]   Medications: Please bring all your medication bottles to your next appointment to ensure we have an accurate record of your prescriptions. [x]   Health Diaries: If you're monitoring any health conditions at home, keeping a diary of your readings can be very helpful for discussions at your next appointment.  Billing [x]   X-ray & Lab Orders: These are billed by separate companies. Contact the invoicing company directly for questions or concerns. [x]   Visit Charges: Discuss any billing inquiries with our administrative services team.  Your Satisfaction Matters [x]   Share Your Experience: We strive for your satisfaction! If you have any complaints, or preferably compliments, please let Dr. Jesus know directly or contact our Practice Administrators, Manuelita Rubin or [employer], by asking at the front desk.   Reviewing Your Records [x]   Review this early draft of your clinical encounter notes below and the final encounter summary tomorrow on MyChart after its been completed.  All orders placed so far are visible here: Age-related osteoporosis with current pathological fracture, sequela -     DG Lumbar Spine Complete; Future -     predniSONE ; Take 2 pills for 3 days, 1 pill for 4 days  Dispense: 10 tablet; Refill: 0 -     Ambulatory referral to Physical Medicine Rehab -     oxyCODONE -Acetaminophen ; Take 1 tablet by mouth every 8 (eight) hours as needed for up to 5 days for pain. Start with just half tablet. Sedating.  Dispense: 15 tablet; Refill: 0 -     Ambulatory referral to Pain Clinic -     Journavx ; Take 1 tablet by  mouth 2 (two) times daily as needed.  Dispense: 60 tablet; Refill: 5  Acute bilateral low back pain without sciatica -     DG Lumbar Spine Complete; Future -     predniSONE ; Take 2 pills for 3 days, 1 pill for 4 days  Dispense: 10 tablet; Refill: 0 -     Ambulatory  referral to Physical Medicine Rehab -     oxyCODONE -Acetaminophen ; Take 1 tablet by mouth every 8 (eight) hours as needed for up to 5 days for pain. Start with just half tablet. Sedating.  Dispense: 15 tablet; Refill: 0 -     Ambulatory referral to Pain Clinic -     Journavx ; Take 1 tablet by mouth 2 (two) times daily as needed.  Dispense: 60 tablet; Refill: 5  Closed compression fracture of body of L1 vertebra (HCC) -     oxyCODONE -Acetaminophen ; Take 1 tablet by mouth every 8 (eight) hours as needed for up to 5 days for pain. Start with just half tablet. Sedating.  Dispense: 15 tablet; Refill: 0 -     Ambulatory referral to Pain Clinic -     Journavx ; Take 1 tablet by mouth 2 (two) times daily as needed.  Dispense: 60 tablet; Refill: 5  Chronic intractable pain -     oxyCODONE -Acetaminophen ; Take 1 tablet by mouth every 8 (eight) hours as needed for up to 5 days for pain. Start with just half tablet. Sedating.  Dispense: 15 tablet; Refill: 0 -     Ambulatory referral to Pain Clinic -     Journavx ; Take 1 tablet by mouth 2 (two) times daily as needed.  Dispense: 60 tablet; Refill: 5

## 2024-11-12 ENCOUNTER — Ambulatory Visit: Payer: Self-pay | Admitting: Internal Medicine

## 2024-11-19 ENCOUNTER — Ambulatory Visit: Admitting: Internal Medicine

## 2024-11-30 ENCOUNTER — Ambulatory Visit: Admitting: Internal Medicine

## 2024-12-17 ENCOUNTER — Ambulatory Visit: Admitting: Internal Medicine

## 2024-12-31 ENCOUNTER — Ambulatory Visit: Admitting: Internal Medicine

## 2025-01-07 ENCOUNTER — Ambulatory Visit: Admitting: Neurology

## 2025-10-18 ENCOUNTER — Ambulatory Visit: Admitting: Rheumatology
